# Patient Record
Sex: Female | Born: 1935 | Race: White | Hispanic: No | Smoking: Never smoker
Health system: Southern US, Community
[De-identification: ages and names within clinical notes are randomized; demographics above are authoritative.]

## PROBLEM LIST (undated history)

## (undated) DIAGNOSIS — J189 Pneumonia, unspecified organism: Secondary | ICD-10-CM

## (undated) DIAGNOSIS — T4145XA Adverse effect of unspecified anesthetic, initial encounter: Secondary | ICD-10-CM

## (undated) DIAGNOSIS — R7611 Nonspecific reaction to tuberculin skin test without active tuberculosis: Secondary | ICD-10-CM

## (undated) DIAGNOSIS — I219 Acute myocardial infarction, unspecified: Secondary | ICD-10-CM

## (undated) DIAGNOSIS — I2510 Atherosclerotic heart disease of native coronary artery without angina pectoris: Secondary | ICD-10-CM

## (undated) DIAGNOSIS — I509 Heart failure, unspecified: Secondary | ICD-10-CM

## (undated) DIAGNOSIS — R0602 Shortness of breath: Secondary | ICD-10-CM

## (undated) DIAGNOSIS — I209 Angina pectoris, unspecified: Secondary | ICD-10-CM

## (undated) DIAGNOSIS — R011 Cardiac murmur, unspecified: Secondary | ICD-10-CM

## (undated) DIAGNOSIS — I10 Essential (primary) hypertension: Secondary | ICD-10-CM

## (undated) DIAGNOSIS — G8929 Other chronic pain: Secondary | ICD-10-CM

## (undated) DIAGNOSIS — M545 Low back pain, unspecified: Secondary | ICD-10-CM

## (undated) DIAGNOSIS — E119 Type 2 diabetes mellitus without complications: Secondary | ICD-10-CM

## (undated) DIAGNOSIS — Z9289 Personal history of other medical treatment: Secondary | ICD-10-CM

## (undated) DIAGNOSIS — T8859XA Other complications of anesthesia, initial encounter: Secondary | ICD-10-CM

## (undated) DIAGNOSIS — K219 Gastro-esophageal reflux disease without esophagitis: Secondary | ICD-10-CM

## (undated) DIAGNOSIS — D649 Anemia, unspecified: Secondary | ICD-10-CM

## (undated) DIAGNOSIS — E7800 Pure hypercholesterolemia, unspecified: Secondary | ICD-10-CM

## (undated) DIAGNOSIS — M1990 Unspecified osteoarthritis, unspecified site: Secondary | ICD-10-CM

## (undated) HISTORY — PX: HEMORRHOID BANDING: SHX5850

## (undated) HISTORY — PX: FRACTURE SURGERY: SHX138

## (undated) HISTORY — PX: APPENDECTOMY: SHX54

## (undated) HISTORY — PX: CATARACT EXTRACTION: SUR2

## (undated) HISTORY — PX: ABDOMINAL HERNIA REPAIR: SHX539

## (undated) HISTORY — PX: HERNIA REPAIR: SHX51

## (undated) HISTORY — PX: FOOT FRACTURE SURGERY: SHX645

## (undated) HISTORY — PX: CARDIAC CATHETERIZATION: SHX172

---
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
## 1979-05-11 HISTORY — PX: CHOLECYSTECTOMY OPEN: SUR202

## 2009-09-09 HISTORY — PX: TISSUE AORTIC VALVE REPLACEMENT: SHX2527

## 2013-12-08 DIAGNOSIS — I219 Acute myocardial infarction, unspecified: Secondary | ICD-10-CM

## 2013-12-08 HISTORY — DX: Acute myocardial infarction, unspecified: I21.9

## 2013-12-27 ENCOUNTER — Encounter (HOSPITAL_COMMUNITY): Payer: Self-pay | Admitting: *Deleted

## 2013-12-27 ENCOUNTER — Inpatient Hospital Stay (HOSPITAL_COMMUNITY)
Admission: AD | Admit: 2013-12-27 | Discharge: 2013-12-29 | DRG: 280 | Disposition: A | Discharge status: Home / Self Care | Payer: Medicare Other | Source: Other Acute Inpatient Hospital | Attending: Cardiology | Admitting: Cardiology

## 2013-12-27 DIAGNOSIS — I10 Essential (primary) hypertension: Secondary | ICD-10-CM

## 2013-12-27 DIAGNOSIS — R000 Tachycardia, unspecified: Secondary | ICD-10-CM | POA: Diagnosis present

## 2013-12-27 DIAGNOSIS — Y9210 Unspecified residential institution as the place of occurrence of the external cause: Secondary | ICD-10-CM | POA: Diagnosis not present

## 2013-12-27 DIAGNOSIS — R0902 Hypoxemia: Secondary | ICD-10-CM | POA: Diagnosis present

## 2013-12-27 DIAGNOSIS — IMO0002 Reserved for concepts with insufficient information to code with codable children: Secondary | ICD-10-CM | POA: Diagnosis not present

## 2013-12-27 DIAGNOSIS — D638 Anemia in other chronic diseases classified elsewhere: Secondary | ICD-10-CM | POA: Diagnosis present

## 2013-12-27 DIAGNOSIS — Z7901 Long term (current) use of anticoagulants: Secondary | ICD-10-CM

## 2013-12-27 DIAGNOSIS — I214 Non-ST elevation (NSTEMI) myocardial infarction: Principal | ICD-10-CM

## 2013-12-27 DIAGNOSIS — Z881 Allergy status to other antibiotic agents status: Secondary | ICD-10-CM

## 2013-12-27 DIAGNOSIS — I428 Other cardiomyopathies: Secondary | ICD-10-CM | POA: Diagnosis present

## 2013-12-27 DIAGNOSIS — E039 Hypothyroidism, unspecified: Secondary | ICD-10-CM

## 2013-12-27 DIAGNOSIS — Y849 Medical procedure, unspecified as the cause of abnormal reaction of the patient, or of later complication, without mention of misadventure at the time of the procedure: Secondary | ICD-10-CM | POA: Diagnosis not present

## 2013-12-27 DIAGNOSIS — Z952 Presence of prosthetic heart valve: Secondary | ICD-10-CM

## 2013-12-27 DIAGNOSIS — Z885 Allergy status to narcotic agent status: Secondary | ICD-10-CM

## 2013-12-27 DIAGNOSIS — I4891 Unspecified atrial fibrillation: Secondary | ICD-10-CM | POA: Diagnosis present

## 2013-12-27 DIAGNOSIS — I5043 Acute on chronic combined systolic (congestive) and diastolic (congestive) heart failure: Secondary | ICD-10-CM | POA: Diagnosis present

## 2013-12-27 DIAGNOSIS — R031 Nonspecific low blood-pressure reading: Secondary | ICD-10-CM | POA: Diagnosis present

## 2013-12-27 DIAGNOSIS — R197 Diarrhea, unspecified: Secondary | ICD-10-CM | POA: Diagnosis present

## 2013-12-27 DIAGNOSIS — I482 Chronic atrial fibrillation, unspecified: Secondary | ICD-10-CM

## 2013-12-27 DIAGNOSIS — E669 Obesity, unspecified: Secondary | ICD-10-CM

## 2013-12-27 DIAGNOSIS — I9589 Other hypotension: Secondary | ICD-10-CM | POA: Diagnosis present

## 2013-12-27 DIAGNOSIS — L0390 Cellulitis, unspecified: Secondary | ICD-10-CM

## 2013-12-27 DIAGNOSIS — K56609 Unspecified intestinal obstruction, unspecified as to partial versus complete obstruction: Secondary | ICD-10-CM

## 2013-12-27 DIAGNOSIS — Z79899 Other long term (current) drug therapy: Secondary | ICD-10-CM

## 2013-12-27 DIAGNOSIS — I509 Heart failure, unspecified: Secondary | ICD-10-CM | POA: Diagnosis present

## 2013-12-27 DIAGNOSIS — G2581 Restless legs syndrome: Secondary | ICD-10-CM | POA: Diagnosis present

## 2013-12-27 DIAGNOSIS — T888XXA Other specified complications of surgical and medical care, not elsewhere classified, initial encounter: Secondary | ICD-10-CM | POA: Diagnosis not present

## 2013-12-27 DIAGNOSIS — Z6841 Body Mass Index (BMI) 40.0 and over, adult: Secondary | ICD-10-CM

## 2013-12-27 DIAGNOSIS — I429 Cardiomyopathy, unspecified: Secondary | ICD-10-CM

## 2013-12-27 DIAGNOSIS — Z7982 Long term (current) use of aspirin: Secondary | ICD-10-CM

## 2013-12-27 HISTORY — DX: Essential (primary) hypertension: I10

## 2013-12-27 HISTORY — DX: Shortness of breath: R06.02

## 2013-12-27 HISTORY — DX: Angina pectoris, unspecified: I20.9

## 2013-12-27 HISTORY — DX: Cardiac murmur, unspecified: R01.1

## 2013-12-27 HISTORY — DX: Atherosclerotic heart disease of native coronary artery without angina pectoris: I25.10

## 2013-12-27 HISTORY — DX: Heart failure, unspecified: I50.9

## 2013-12-27 LAB — MRSA PCR SCREENING: MRSA by PCR: NEGATIVE

## 2013-12-27 MED ORDER — OCUVITE-LUTEIN PO CAPS
1.0000 | ORAL_CAPSULE | Freq: Every day | ORAL | Status: DC
  Administered 2013-12-28 – 2013-12-29 (×2): 1 via ORAL
  Filled 2013-12-27 (×2): qty 1

## 2013-12-27 MED ORDER — ICAPS PO CAPS: 1.0000 | ORAL_CAPSULE | Freq: Every day | ORAL | Status: DC

## 2013-12-27 MED ORDER — CLOBETASOL PROPIONATE 0.05 % EX CREA
1.0000 "application " | TOPICAL_CREAM | Freq: Two times a day (BID) | CUTANEOUS | Status: DC
  Administered 2013-12-28: 1 via TOPICAL
  Filled 2013-12-27 (×2): qty 15

## 2013-12-27 MED ORDER — COLESEVELAM HCL 625 MG PO TABS
1875.0000 mg | ORAL_TABLET | Freq: Two times a day (BID) | ORAL | Status: DC
  Administered 2013-12-28 – 2013-12-29 (×4): 1875 mg via ORAL
  Filled 2013-12-27 (×6): qty 3

## 2013-12-27 MED ORDER — FUROSEMIDE 40 MG PO TABS
40.0000 mg | ORAL_TABLET | Freq: Two times a day (BID) | ORAL | Status: DC
  Administered 2013-12-28 – 2013-12-29 (×3): 40 mg via ORAL
  Filled 2013-12-27 (×5): qty 1

## 2013-12-27 MED ORDER — PANTOPRAZOLE SODIUM 40 MG IV SOLR
40.0000 mg | INTRAVENOUS | Status: DC
  Administered 2013-12-27: 40 mg via INTRAVENOUS
  Filled 2013-12-27 (×2): qty 40

## 2013-12-27 MED ORDER — ASPIRIN EC 81 MG PO TBEC
81.0000 mg | DELAYED_RELEASE_TABLET | Freq: Every day | ORAL | Status: DC
  Administered 2013-12-28 – 2013-12-29 (×2): 81 mg via ORAL
  Filled 2013-12-27 (×2): qty 1

## 2013-12-27 MED ORDER — BISOPROLOL FUMARATE 5 MG PO TABS
2.5000 mg | ORAL_TABLET | Freq: Every day | ORAL | Status: DC
  Administered 2013-12-27 – 2013-12-28 (×2): 2.5 mg via ORAL
  Filled 2013-12-27 (×2): qty 0.5

## 2013-12-27 MED ORDER — OXYBUTYNIN CHLORIDE ER 5 MG PO TB24
5.0000 mg | ORAL_TABLET | Freq: Every day | ORAL | Status: DC
  Administered 2013-12-28 – 2013-12-29 (×2): 5 mg via ORAL
  Filled 2013-12-27 (×2): qty 1

## 2013-12-27 MED ORDER — DILTIAZEM HCL ER 120 MG PO CP24
120.0000 mg | ORAL_CAPSULE | Freq: Every day | ORAL | Status: DC
  Administered 2013-12-28: 120 mg via ORAL
  Filled 2013-12-27: qty 1

## 2013-12-27 MED ORDER — FENTANYL 12 MCG/HR TD PT72
25.0000 ug | MEDICATED_PATCH | TRANSDERMAL | Status: DC
  Administered 2013-12-28: 25 ug via TRANSDERMAL
  Filled 2013-12-27: qty 1

## 2013-12-27 MED ORDER — NITROGLYCERIN 0.4 MG SL SUBL: 0.4000 mg | SUBLINGUAL_TABLET | SUBLINGUAL | Status: DC | PRN

## 2013-12-27 MED ORDER — ONDANSETRON HCL 4 MG/2ML IJ SOLN: 4.0000 mg | Freq: Four times a day (QID) | INTRAMUSCULAR | Status: DC | PRN

## 2013-12-27 MED ORDER — HEPARIN SODIUM (PORCINE) 5000 UNIT/ML IJ SOLN
5000.0000 [IU] | Freq: Three times a day (TID) | INTRAMUSCULAR | Status: DC
  Filled 2013-12-27 (×2): qty 1

## 2013-12-27 MED ORDER — LINACLOTIDE 290 MCG PO CAPS
290.0000 ug | ORAL_CAPSULE | Freq: Every day | ORAL | Status: DC
  Administered 2013-12-28 – 2013-12-29 (×2): 290 ug via ORAL
  Filled 2013-12-27 (×2): qty 1

## 2013-12-27 MED ORDER — VITAMIN D 1000 UNITS PO TABS
5000.0000 [IU] | ORAL_TABLET | Freq: Every day | ORAL | Status: DC
  Administered 2013-12-28 – 2013-12-29 (×2): 5000 [IU] via ORAL
  Filled 2013-12-27 (×2): qty 5

## 2013-12-27 MED ORDER — ACETAMINOPHEN 325 MG PO TABS: 650.0000 mg | ORAL_TABLET | ORAL | Status: DC | PRN

## 2013-12-27 MED ORDER — SODIUM CHLORIDE 0.9 % IV SOLN
1.0000 mL/kg/h | INTRAVENOUS | Status: DC
  Administered 2013-12-27: 1 mL/kg/h via INTRAVENOUS

## 2013-12-27 MED ORDER — POTASSIUM CHLORIDE CRYS ER 20 MEQ PO TBCR
20.0000 meq | EXTENDED_RELEASE_TABLET | Freq: Two times a day (BID) | ORAL | Status: DC
  Administered 2013-12-28 – 2013-12-29 (×2): 20 meq via ORAL
  Filled 2013-12-27 (×4): qty 1

## 2013-12-27 NOTE — H&P (Signed)
Patient ID: Melanie Camacho MRN: [id number], DOB/AGE: [birth year]   Admit date: 12/27/2013   Primary Physician: PROVIDER NOT IN SYSTEM Primary Cardiologist: Dr Patty Sermons (new)  HPI: 80 y/o from [place] with a history of tissue AVR in 2011 at [facility], no grafts per pt's daughter who is an Charity fundraiser in the OR here at [facility]. The pt has CAF, failed to hold after several attempts per pt's daughter. She has been rate controlled and on Eliquis. The pt had some intermittent issues with "heart failure secondary to her AF" but overall has been living alone, driving, taking care of her own home till recently. About 10 days ago she was admitted to [facility] with SBO. This was treated conservatively. She was off her Eliquis for 4-5 days. She was discharged then found at home in a chair unresponsive by the daughter. She was transported to [facility]. In the ER she was tachycardic, mildly hypoxic. Her mental status gradually improved. MRI was negative. Her Troponin's came back elevated with a peak Troponin of 33. An echo showed an EF of 40% with inf HK. Initially family was not inclined to proceed with cath but now they are and requested transfer to [facility]. The pt denies any anginal symptoms to me. Her labs from [facility] were stable but will repeat CBC, BMP, INR here.    Problem List: Past Medical History  Diagnosis Date  . Coronary artery disease   . Anginal pain   . Hypertension   . Heart murmur   . CHF (congestive heart failure)   . Shortness of breath     History reviewed. No pertinent past surgical history.   Allergies:  Allergies  Allergen Reactions  . Morphine And Related     Stevens-Johnsons  . Tape   . Bacitracin Rash  . Betadine [Povidone Iodine] Rash  . Clindamycin/Lincomycin Rash  . Vancomycin Rash     Home Medications No current facility-administered medications for this encounter.  See Med Rec   History reviewed. No pertinent family history. She denies a family history of  early CAD or DM.    History   Social History  . Marital Status: Widowed    Spouse Name: N/A    Number of Children: N/A  . Years of Education: N/A   Occupational History  . Not on file.   Social History Main Topics  . Smoking status: Never Smoker   . Smokeless tobacco: Never Used  . Alcohol Use: No  . Drug Use: No  . Sexual Activity: No   Other Topics Concern  . Not on file   Social History Narrative  . No narrative on file     Review of Systems: General: negative for chills, fever, night sweats or weight changes.  She had a diagnosis of diabetes but lost weight and is now off all medications for this. She has DJD, bilat rotator cuff problems, limited mobility Cardiovascular: negative for chest pain,  She has dyspnea on exertion, chronic edema,  No orthopnea, palpitations, paroxysmal nocturnal dyspnea or shortness of breath Dermatological: negative for rash Respiratory: negative for cough or wheezing Urologic: negative for hematuria Abdominal: negative for nausea, vomiting, diarrhea, bright red blood per rectum, melena, or hematemesis. Recent SBO. She had GI bleeding in 2003 s/p partial colectomy with a chronic incisional hernia Neurologic: negative for visual changes, syncope, or dizziness All other systems reviewed and are otherwise negative except as noted above.  Physical Exam: Blood pressure 106/65, pulse 104, temperature 97.4 F (  36.3 C), temperature source Oral, resp. rate 29, height 5\' 1"  (1.549 m), weight 238 lb 1.6 oz (108 kg), SpO2 100.00%.  General appearance: alert, cooperative, no distress and morbidly obese Neck: no carotid bruit and no JVD Lungs: clear to auscultation bilaterally Heart: irregularly irregular rhythm and 2/6 systolic murmur AOv Abdomen: morbid obesity, soft, mid line surgical scar, abdominal hernia noted near her surgical scar Extremities: 2+ bilat edema Pulses: 2+ and symmetric Skin: pale, cool, dry Neurologic: Grossly  normal    Labs:  No results found for this or any previous visit (from the past 24 hour(s)).   Radiology/Studies: No results found.  EKG:  From [facility] - AF low voltage, inferior TWI, poor ant RW  ASSESSMENT AND PLAN:  Principal Problem:   NSTEMI (non-ST elevated myocardial infarction) Active Problems:   Chronic a-fib   S/P AVR-(tissue) 2011 at [facility] - no CABG with this   Cardiomyopathy- EF 40% with inf HK by recent echo   SBO (small bowel obstruction)- recently off Eliquis for this   HTN- (transient hypotension during recent admission)   Hypothyroid   Obesity- BMI 45   PLAN: ? If she had an embolic MI while off Eliquis. No apparent CAD when she had her AVR in 2011. Pt and daughter agreeable to proceed with cath. She will need to be a radial procedure secondary to obesity and access. She has already had her Eliquis today- hold further doses till after cath.    Signed, Abelino DerrickLuke K Kilroy, PA-C 12/27/2013, 6:51 PM  I have reviewed the patient's history with her and her daughter.  I am in agreement with [facility]'s assessment as noted above.  She has a history of a bioprosthetic aortic valve replacement 4 years ago at [facility] by Dr. Silvestre MesiGlower.  Cardiac catheterization prior to her aortic valve replacement did not show any evidence of coronary disease according to the patient and according to the daughter who is a Engineer, civil (consulting)nurse.  The patient is in chronic atrial fibrillation.  She recently had been off her apixaban because of a recent admission for small bowel obstruction.  On the evening of April 16 the daughter who lives in [place] was unable to make contact with her mother [place] in [place].  The daughter drove to a gross per her and found her mother at about 2 AM unresponsive.  She had a blood pressure and a pulse.  EMS was called and she was taken to the hospital in [place].  Initially her outlook appeared very poor and the family did not want a cardiac catheterization done to evaluate  her marked elevation of troponin.  Her troponin peak at 33 and today was down to 5.  Subsequently because of the patient's clinical improvement the family decided that they didn't want her to have a cardiac catheterization but wanted it done here in [place] with his daughter lives.  Arrangements were therefore made for her transfer.  Her renal function has been stable.  We will plan for left heart catheter in a.m.  She certainly may have had a coronary artery embolus to explain her clinical presentation.

## 2013-12-27 NOTE — Progress Notes (Signed)
eLink Physician-Brief Progress Note Patient Name: Melanie Camacho DOB: 1931-10-25 MRN: [id number]  Date of Service  12/27/2013   HPI/Events of Note   Needs DVT proph  eICU Interventions  See orders for same    Intervention Category Intermediate Interventions: Best-practice therapies (e.g. DVT, beta blocker, etc.)  [name] 12/27/2013, 10:31 PM

## 2013-12-28 ENCOUNTER — Encounter (HOSPITAL_COMMUNITY)
Admission: AD | Disposition: A | Discharge status: Home / Self Care | Payer: Self-pay | Source: Other Acute Inpatient Hospital | Attending: Cardiology

## 2013-12-28 ENCOUNTER — Encounter (HOSPITAL_COMMUNITY): Payer: Self-pay | Admitting: Anesthesiology

## 2013-12-28 DIAGNOSIS — K56609 Unspecified intestinal obstruction, unspecified as to partial versus complete obstruction: Secondary | ICD-10-CM | POA: Diagnosis not present

## 2013-12-28 DIAGNOSIS — I214 Non-ST elevation (NSTEMI) myocardial infarction: Secondary | ICD-10-CM

## 2013-12-28 DIAGNOSIS — I10 Essential (primary) hypertension: Secondary | ICD-10-CM | POA: Diagnosis not present

## 2013-12-28 DIAGNOSIS — I4891 Unspecified atrial fibrillation: Secondary | ICD-10-CM | POA: Diagnosis not present

## 2013-12-28 DIAGNOSIS — L0390 Cellulitis, unspecified: Secondary | ICD-10-CM | POA: Diagnosis present

## 2013-12-28 DIAGNOSIS — Z954 Presence of other heart-valve replacement: Secondary | ICD-10-CM

## 2013-12-28 DIAGNOSIS — E039 Hypothyroidism, unspecified: Secondary | ICD-10-CM | POA: Diagnosis not present

## 2013-12-28 DIAGNOSIS — L539 Erythematous condition, unspecified: Secondary | ICD-10-CM

## 2013-12-28 HISTORY — PX: LEFT HEART CATHETERIZATION WITH CORONARY ANGIOGRAM: SHX5451

## 2013-12-28 LAB — CBC
HCT: 32.6 % — ABNORMAL LOW (ref 36.0–46.0)
HEMOGLOBIN: 10.7 g/dL — AB (ref 12.0–15.0)
MCH: 31.5 pg (ref 26.0–34.0)
MCHC: 32.8 g/dL (ref 30.0–36.0)
MCV: 95.9 fL (ref 78.0–100.0)
Platelets: 277 10*3/uL (ref 150–400)
RBC: 3.4 MIL/uL — AB (ref 3.87–5.11)
RDW: 15.3 % (ref 11.5–15.5)
WBC: 5.4 10*3/uL (ref 4.0–10.5)

## 2013-12-28 LAB — BASIC METABOLIC PANEL
BUN: 9 mg/dL (ref 6–23)
CALCIUM: 8.3 mg/dL — AB (ref 8.4–10.5)
CO2: 21 meq/L (ref 19–32)
CREATININE: 0.91 mg/dL (ref 0.50–1.10)
Chloride: 105 mEq/L (ref 96–112)
GFR calc Af Amer: 68 mL/min — ABNORMAL LOW (ref >90)
GFR calc non Af Amer: 58 mL/min — ABNORMAL LOW (ref >90)
GLUCOSE: 82 mg/dL (ref 70–99)
Potassium: 4.2 mEq/L (ref 3.7–5.3)
Sodium: 139 mEq/L (ref 137–147)

## 2013-12-28 LAB — TSH: TSH: 8.27 u[IU]/mL — ABNORMAL HIGH (ref 0.350–4.500)

## 2013-12-28 LAB — PROTIME-INR
INR: 1.28 (ref 0.00–1.49)
Prothrombin Time: 15.7 seconds — ABNORMAL HIGH (ref 11.6–15.2)

## 2013-12-28 SURGERY — LEFT HEART CATHETERIZATION WITH CORONARY ANGIOGRAM
Anesthesia: LOCAL

## 2013-12-28 MED ORDER — BISOPROLOL FUMARATE 5 MG PO TABS
5.0000 mg | ORAL_TABLET | Freq: Every day | ORAL | Status: DC
  Administered 2013-12-29: 5 mg via ORAL
  Filled 2013-12-28: qty 1

## 2013-12-28 MED ORDER — NITROGLYCERIN 0.2 MG/ML ON CALL CATH LAB
INTRAVENOUS | Status: AC
  Filled 2013-12-28: qty 1

## 2013-12-28 MED ORDER — FENTANYL CITRATE 0.05 MG/ML IJ SOLN
INTRAMUSCULAR | Status: AC
  Filled 2013-12-28: qty 2

## 2013-12-28 MED ORDER — PANTOPRAZOLE SODIUM 40 MG PO TBEC
40.0000 mg | DELAYED_RELEASE_TABLET | Freq: Every day | ORAL | Status: DC
  Administered 2013-12-28 – 2013-12-29 (×2): 40 mg via ORAL
  Filled 2013-12-28 (×2): qty 1

## 2013-12-28 MED ORDER — APIXABAN 5 MG PO TABS
5.0000 mg | ORAL_TABLET | Freq: Two times a day (BID) | ORAL | Status: DC
  Filled 2013-12-28 (×2): qty 1

## 2013-12-28 MED ORDER — MIDAZOLAM HCL 2 MG/2ML IJ SOLN
INTRAMUSCULAR | Status: AC
  Filled 2013-12-28: qty 2

## 2013-12-28 MED ORDER — ROPINIROLE HCL 0.25 MG PO TABS
0.2500 mg | ORAL_TABLET | Freq: Every day | ORAL | Status: DC
  Administered 2013-12-28: 0.25 mg via ORAL
  Filled 2013-12-28 (×2): qty 1

## 2013-12-28 MED ORDER — HEPARIN (PORCINE) IN NACL 2-0.9 UNIT/ML-% IJ SOLN
INTRAMUSCULAR | Status: AC
  Filled 2013-12-28: qty 1500

## 2013-12-28 MED ORDER — SODIUM CHLORIDE 0.9 % IV SOLN
3.0000 g | Freq: Four times a day (QID) | INTRAVENOUS | Status: DC
  Administered 2013-12-28 – 2013-12-29 (×2): 3 g via INTRAVENOUS
  Filled 2013-12-28 (×5): qty 3

## 2013-12-28 MED ORDER — SODIUM CHLORIDE 0.9 % IV SOLN
INTRAVENOUS | Status: DC
  Administered 2013-12-28: 14:00:00 via INTRAVENOUS

## 2013-12-28 MED ORDER — LIDOCAINE HCL (PF) 1 % IJ SOLN
INTRAMUSCULAR | Status: AC
  Filled 2013-12-28: qty 30

## 2013-12-28 NOTE — Progress Notes (Signed)
To the cath lab by bed, stable. 

## 2013-12-28 NOTE — Consult Note (Signed)
[facility] Medical Consultation  ARLENY RAMP IBB:[id number] DOB: Jan 24, 1932 DOA: 12/27/2013 PCP: PROVIDER NOT IN SYSTEM   Requesting physician: DR Anne Fu Date of consultation: 4/20 Reason for consultation: left forearm erythema  Impression/Recommendations Principal Problem:   NSTEMI (non-ST elevated myocardial infarction) Active Problems:   Chronic a-fib   S/P AVR-(tissue) 2011 at [facility] - no CABG with this   Cardiomyopathy- EF 40% with inf HK by recent echo   HTN- (transient hypotension during recent admission)   Hypothyroid   Obesity- BMI 45   SBO (small bowel obstruction)- recently off Eliquis for this    Left fore arm erythema: - probably from  IV infiltration. She has diffuse erythema and painful on palpation. Venous dopplers ordered to evaluate for thrombosis. Vancomycin ordered for possible early cellulitis.  - elevate the arm with warm compresses.   2. Watery diarrhea: - with her h/o recent antibiotic use, please rule out c diff pcr. Get GI pathogen by PCR.   3. Restless leg syndrome: - start her ropinorole  4. Hypertension: - bp borderline. Continue to monitor.   5. SBO: - no nausea or vomiting. Watery diarrhea.   6. Atrial fibrillation: - rate controlled.  7. Abnormal TSH: - get free t4 and free t3.   8. Anemia: - anemia panel ordered.      I will followup again tomorrow. Please contact me if I can be of assistance in the meanwhile. Thank you for this consultation.  Chief Complaint:   HPI: 80 y/o from [place] with a history of tissue AVR in 2011 at [place], and atrial fibrillatio is on eliquis came to [facility] for sbo.. In the ER she was tachycardic, mildly hypoxic. Her mental status gradually improved. MRI was negative. Her Troponin's came back elevated with a peak Troponin of 33. An echo showed an EF of 40% with inf HK. Pt family requested transfer to [facility]she was seen by cardiology and admitted by the same, underwent cardiac catheterization  on 4/21. Meanwhile patient had a peripheral line put in the left arm at the ED, and as per the Family at bedside, it had infiltrated and it was removed. She had IV heparin running in t he peripheral line. We were consulted to evaluate her left forearm erythema. In addition the above pt was recently treated for UTI with antibiotics, and she reports watery diarrhea 4 to 5 episodes today. She also reports restless legs and it has been bothering her.     Review of Systems:  See HPI otherwise negative.   Past Medical History  Diagnosis Date  . Coronary artery disease   . Anginal pain   . Hypertension   . Heart murmur   . CHF (congestive heart failure)   . Shortness of breath    History reviewed. No pertinent past surgical history. Social History:  reports that she has never smoked. She has never used smokeless tobacco. She reports that she does not drink alcohol or use illicit drugs.  Allergies  Allergen Reactions  . Morphine And Related     Stevens-Johnsons  . Tape   . Bacitracin Rash  . Betadine [Povidone Iodine] Rash  . Clindamycin/Lincomycin Rash  . Vancomycin Rash   History reviewed. No pertinent family history.  Prior to Admission medications   Medication Sig Start Date End Date Taking? Authorizing Provider  apixaban (ELIQUIS) 5 MG TABS tablet Take 5 mg by mouth 2 (two) times daily.   Yes Historical Provider, MD  bisoprolol (ZEBETA) 5 MG tablet Take 2.5  mg by mouth daily.   Yes Historical Provider, MD  Cholecalciferol (VITAMIN D3) 5000 UNITS CAPS Take 5,000 Units by mouth daily.   Yes Historical Provider, MD  clobetasol cream (TEMOVATE) 0.05 % Apply 1 application topically 2 (two) times daily.   Yes Historical Provider, MD  colesevelam (WELCHOL) 625 MG tablet Take 1,875 mg by mouth 2 (two) times daily with a meal.   Yes Historical Provider, MD  diltiazem (DILACOR XR) 120 MG 24 hr capsule Take 120 mg by mouth daily.   Yes Historical Provider, MD  fentaNYL (DURAGESIC - DOSED  MCG/HR) 25 MCG/HR patch Place 25 mcg onto the skin every 3 (three) days.   Yes Historical Provider, MD  furosemide (LASIX) 40 MG tablet Take 40 mg by mouth 2 (two) times daily.   Yes Historical Provider, MD  Linaclotide (LINZESS) 290 MCG CAPS capsule Take 290 mcg by mouth daily.   Yes Historical Provider, MD  Multiple Vitamins-Minerals (ICAPS) CAPS Take 1 capsule by mouth daily.   Yes Historical Provider, MD  Multiple Vitamins-Minerals (MULTIVITAMIN WITH MINERALS) tablet Take 1 tablet by mouth daily.   Yes Historical Provider, MD  OVER THE COUNTER MEDICATION Take 1 capsule by mouth daily. Hair, Skin and Nails   Yes Historical Provider, MD  oxybutynin (DITROPAN-XL) 5 MG 24 hr tablet Take 5 mg by mouth daily.   Yes Historical Provider, MD  potassium chloride SA (K-DUR,KLOR-CON) 20 MEQ tablet Take 20 mEq by mouth 2 (two) times daily.   Yes Historical Provider, MD   Physical Exam: Blood pressure 101/48, pulse 92, temperature 98 F (36.7 C), temperature source Oral, resp. rate 21, height 5\' 1"  (1.549 m), weight 108 kg (238 lb 1.6 oz), SpO2 96.00%. Filed Vitals:   12/28/13 1149  BP: 101/48  Pulse: 92  Temp: 98 F (36.7 C)  Resp: 21     General:  Alert afebrile comfortable  Cardiovascular: s1s2   Respiratory: CTAB  Abdomen: soft NT ND BS+  Musculoskeletal/ skin: : left forearm erythema and tenderness.    Labs on Admission:  Basic Metabolic Panel:  Recent Labs Lab 12/28/13 0725  NA 139  K 4.2  CL 105  CO2 21  GLUCOSE 82  BUN 9  CREATININE 0.91  CALCIUM 8.3*   Liver Function Tests: No results found for this basename: AST, ALT, ALKPHOS, BILITOT, PROT, ALBUMIN,  in the last 168 hours No results found for this basename: LIPASE, AMYLASE,  in the last 168 hours No results found for this basename: AMMONIA,  in the last 168 hours CBC:  Recent Labs Lab 12/28/13 0725  WBC 5.4  HGB 10.7*  HCT 32.6*  MCV 95.9  PLT 277   Cardiac Enzymes: No results found for this basename:  CKTOTAL, CKMB, CKMBINDEX, TROPONINI,  in the last 168 hours BNP: No components found with this basename: POCBNP,  CBG: No results found for this basename: GLUCAP,  in the last 168 hours  Radiological Exams on Admission: No results found.   Time spent: 50 min  Kathlen ModyVijaya Shenika Quint [facility] Pager [phone]  If 7PM-7AM, please contact night-coverage [email or web address] Password [email or web address] 12/28/2013, 1:18 PM

## 2013-12-28 NOTE — Progress Notes (Addendum)
ANTIBIOTIC CONSULT NOTE - INITIAL  Pharmacy Consult for Unasyn Indication: LUE cellultis  Allergies  Allergen Reactions  . Morphine And Related     Stevens-Johnsons  . Tape   . Bacitracin Rash  . Betadine [Povidone Iodine] Rash  . Clindamycin/Lincomycin Rash  . Vancomycin Rash    Patient Measurements: Height: 5\' 1"  (154.9 cm) Weight: 238 lb 1.6 oz (108 kg) IBW/kg (Calculated) : 47.8  Vital Signs: Temp: 97.7 F (36.5 C) (04/21 2028) Temp src: Oral (04/21 2028) BP: 111/33 mmHg (04/21 2028) Pulse Rate: 87 (04/21 2028) Intake/Output from previous day: 04/20 0701 - 04/21 0700 In: 473.4 [I.V.:473.4] Out: 250 [Urine:250] Intake/Output from this shift:    Labs:  Recent Labs  12/28/13 0725  WBC 5.4  HGB 10.7*  PLT 277  CREATININE 0.91   Estimated Creatinine Clearance: 56.9 ml/min (by C-G formula based on Cr of 0.91). No results found for this basename: VANCOTROUGH, Melanie Camacho, VANCORANDOM, GENTTROUGH, GENTPEAK, GENTRANDOM, TOBRATROUGH, TOBRAPEAK, TOBRARND, AMIKACINPEAK, AMIKACINTROU, AMIKACIN,  in the last 72 hours   Microbiology: Recent Results (from the past 720 hour(s))  MRSA PCR SCREENING     Status: None   Collection Time    12/27/13  5:37 PM      Result Value Ref Range Status   MRSA by PCR NEGATIVE  NEGATIVE Final   Comment:            The GeneXpert MRSA Assay (FDA     approved for NASAL specimens     only), is one component of a     comprehensive MRSA colonization     surveillance program. It is not     intended to diagnose MRSA     infection nor to guide or     monitor treatment for     MRSA infections.    Medical History: Past Medical History  Diagnosis Date  . Coronary artery disease   . Anginal pain   . Hypertension   . Heart murmur   . CHF (congestive heart failure)   . Shortness of breath     Assessment: [name] to start Unasyn for LUE cellulitis. The patient is noted to recently have IV access in this arm - now removed. Dopplers have  been ordered to r/o DVT. Wt: 108 kg, SCr 0.91, CrCl~50-60 ml/min (normalized).  Goal of Therapy:  Proper antibiotics for infection/cultures adjusted for renal/hepatic function   Plan:  1. Unasyn 3g IV every 6 hours 2. Will continue to follow renal function, culture results, LOT, and antibiotic de-escalation plans   [name], PharmD, BCPS Clinical Pharmacist Pager: [phone] 12/28/2013 9:13 PM

## 2013-12-28 NOTE — Care Management Note (Addendum)
  Page 1 of 1   12/30/2013     11:58:34 AM CARE MANAGEMENT NOTE 12/30/2013  Patient:  Melanie Camacho, Melanie Camacho   Account Number:  [id number]  Date Initiated:  12/28/2013  Documentation initiated by:  Junius Creamer  Subjective/Objective Assessment:   adm w mi     Action/Plan:   lives alone, granda is rn at [employer]   Anticipated DC Date:  12/30/2013   Anticipated DC Plan:  HOME/SELF CARE      DC Planning Services  CM consult  Medication Assistance      Choice offered to / List presented to:             Status of service:  Completed, signed off Medicare Important Message given?   (If response is "NO", the following Medicare IM given date fields will be blank) Date Medicare IM given:   Date Additional Medicare IM given:    Discharge Disposition:  HOME/SELF CARE  Per UR Regulation:  Reviewed for med. necessity/level of care/duration of stay  If discussed at Long Length of Stay Meetings, dates discussed:    Comments:  CM Consult Note:  Medication Eliquis Benefits Check:  PT COPAY WILL BE $3.50-NO PRIOR AUTH REQUIRED apixaban (ELIQUIS) tablet 5 mg   Oral  :  2 times daily Please check coverage, co-pays, deductibles, authorizations, preferred pharmacy. Thanks, [employer] RN, BSN, MSHL, CCM 12/29/2013  Per hand off report: "---12/28/2013 1633 by [facility]--- transfer from 2H/ cathed/ nor cors/ inpt documented" [employer] RN, BSN, Lena, CCM 12/29/2013

## 2013-12-28 NOTE — Progress Notes (Signed)
Not coming back, belongings sent to cath lab. endorsed to  the staff. Daughter made aware.

## 2013-12-28 NOTE — H&P (View-Only) (Signed)
Subjective:  No complaints. No CP, no SOB.   Objective:  Vital Signs in the last 24 hours: Temp:  [97.4 F (36.3 C)-99.3 F (37.4 C)] 98.6 F (37 C) (04/21 0745) Pulse Rate:  [30-110] 83 (04/21 0800) Resp:  [16-29] 20 (04/21 0800) BP: (94-112)/(37-69) 97/45 mmHg (04/21 0800) SpO2:  [98 %-100 %] 98 % (04/21 0800) Weight:  [238 lb 1.6 oz (108 kg)] 238 lb 1.6 oz (108 kg) (04/20 1738)  Intake/Output from previous day: 04/20 0701 - 04/21 0700 In: 473.4 [I.V.:473.4] Out: 250 [Urine:250]   Physical Exam: General: Well developed, well nourished, in no acute distress. Head:  Normocephalic and atraumatic. Lungs: Clear to auscultation and percussion. Heart: Normal S1 and S2.  Soft systolic murmur, no rubs or gallops.  Abdomen: soft, non-tender, positive bowel sounds. Extremities: No clubbing or cyanosis. 2) BLE edema. Left arm erythema noted. Right arm wrapped in bandage at wrist.  Neurologic: Alert and oriented x 3.    Lab Results:  Recent Labs  12/28/13 0725  WBC 5.4  HGB 10.7*  PLT 277    Recent Labs  12/28/13 0725  NA 139  K 4.2  CL 105  CO2 21  GLUCOSE 82  BUN 9  CREATININE 0.91   Telemetry: AFIB rate cont Personally viewed.   EKG:  AFIB PRWP, NSSTW changes  Cardiac Studies:  Cath pending  . aspirin EC  81 mg Oral Daily  . bisoprolol  2.5 mg Oral Daily  . cholecalciferol  5,000 Units Oral Daily  . clobetasol cream  1 application Topical BID  . colesevelam  1,875 mg Oral BID WC  . fentaNYL  25 mcg Transdermal Q72H  . furosemide  40 mg Oral BID  . Linaclotide  290 mcg Oral Daily  . multivitamin-lutein  1 capsule Oral Daily  . oxybutynin  5 mg Oral Daily  . pantoprazole (PROTONIX) IV  40 mg Intravenous Q24H  . potassium chloride SA  20 mEq Oral BID   Assessment/Plan:  Principal Problem:   NSTEMI (non-ST elevated myocardial infarction) Active Problems:   Chronic a-fib   S/P AVR-(tissue) 2011 at [facility] - no CABG with this   Cardiomyopathy- EF 40%  with inf HK by recent echo   HTN- (transient hypotension during recent admission)   Hypothyroid   Obesity- BMI 45   SBO (small bowel obstruction)- recently off Eliquis for this    80 year old [facility], recent NSTEMI Trop 30, PAF off Eliquis 6 days for recent SBO, EF 40% [place] [facility] [place] [facility] [place]. Daughter works in OR here as Charity fundraiserN.   1) NSTEMI  - cath today  - avoid crossing aortic valve (bioprosthetic)  2) Left arm erythema  - will consult hospitalist  - IV has been removed  3) Recent SBO  - bowels seem improved  - will change pantoprazole to PO from IV  - appreciate hospitalist comments on SBO   4) Cardiomyopathy  - EF 40%  - On bisoprolol 2.5mg  QD  - BP soft (hypotension) will hold off on ACE-I for now.   5) AVR  - [facility] 2011  - Bioprosthetic  6) AFIB  - rate controlled  - would optimally like her to be on increased bisoprolol and stop diltiazem in the setting of decreased EF.   - will stop diltiazem and increase bisoprolol to 5mg .   - Restart Eliquis when able  7) Combined systolic and diastolic HF, acute on chronic  - edema in  LE noted  - continue with lasix 40 PO BID  - no dyspnea  - avoiding calcium channel blockers.    Donato Schultz 12/28/2013, 11:28 AM

## 2013-12-28 NOTE — Interval H&P Note (Signed)
Cath Lab Visit (complete for each Cath Lab visit)  Clinical Evaluation Leading to the Procedure:   ACS: yes  Non-ACS:    Anginal Classification: CCS IV  Anti-ischemic medical therapy: Maximal Therapy (2 or more classes of medications)  Non-Invasive Test Results: No non-invasive testing performed  Prior CABG: No previous CABG      History and Physical Interval Note:  12/28/2013 12:26 PM  Melanie Camacho  has presented today for surgery, with the diagnosis of cp  The various methods of treatment have been discussed with the patient and family. After consideration of risks, benefits and other options for treatment, the patient has consented to  Procedure(s): LEFT HEART CATHETERIZATION WITH CORONARY ANGIOGRAM (N/A) as a surgical intervention .  The patient's history has been reviewed, patient examined, no change in status, stable for surgery.  I have reviewed the patient's chart and labs.  Questions were answered to the patient's satisfaction.     Lennette Bihari

## 2013-12-28 NOTE — CV Procedure (Signed)
Melanie Camacho is a 80 y.o. female    [id number]  [id number] LOCATION:  FACILITY: [facility]  PHYSICIAN: Lennette Bihari, MD, [facility] March 26, 1932   DATE OF PROCEDURE:  12/28/2013    CARDIAC CATHETERIZATION     HISTORY:    [name] is a 80 year old female from [place], who underwent aortic valve replacement with a tissue valve in 2011 at [place].  At that time she had apparently normal coronary arteries.  She recently was hospitalized in [facility] with a small bowel obstruction, which was treated conservatively.  She has a history of atrial fibrillation and was of Eliquis for 4-5 days.  There is a question of an embolic etiology to an MI with a peak troponin of 33.  An echo at the outside hospital showed an ejection fraction of 40% with inferior hypokinesis.  The patient  was transported to [facility].  She has been off Eliquis since admission to this hospital and now presents for definitive cardiac catheterization.   PROCEDURE:  Coronary angiography and supravalvular aortography.  The patient was brought to the second floor [facility] cath lab in the postabsorptive state. She was premedicated with Versed 1 mg and fentanyl 25 mcg.  She did not appear to have access to attempt a radial approach due to multiple infiltrations of her left arm and a bandage over her right wrist.  Her  right groin was prepped and shaved in usual sterile fashion. Xylocaine 1% was used for local anesthesia.  The right femoral artery was punctured anteriorly with the first stent and a 5 French sheath was inserted without difficulty.  Coronary angiography was performed with 5 French Judkins 4 left and right diagnostic catheters.  With the patient having an aortic valve replacement, this is was made not to cross the aortic valve.  A 5 French pigtail catheter was inserted and advanced to the central aorta and supravalvular aortography was performed without difficulty.  All catheters were removed and the  patient.  Hemostasis was obtained by direct pressure.  She tolerated the procedure well and returned to her room in stable condition.  HEMODYNAMICS:   Central Aorta: 117/61    ANGIOGRAPHY:  1. Left main:  Normal vessel, which bifurcated into the LAD and left circumflex artery.  2. LAD:  Angiographically normal vessel, which gave rise to 2 diagonal vessels and several septal perforator arteries and extended to and wrapped around the LV apex 3. Left circumflex: Normal vessel, which gave rise to 2 marginal branches. 4. Right coronary artery: Very large, dominant, angiographically normal vessel, which give rise to a large PDA and large PLA vessel  Supravalvular aortography revealed a competent bioprosthetic aortic valve without significant aortic insufficiency.  IMPRESSION:  Normal appearing bioprosthetic aortic valve replacement without significant angiographic aortic insufficiency.  Normal coronary arteries  Lennette Bihari, MD, [facility] 12/28/2013 1:27 PM

## 2013-12-28 NOTE — Progress Notes (Signed)
  Subjective:  No complaints. No CP, no SOB.   Objective:  Vital Signs in the last 24 hours: Temp:  [97.4 F (36.3 C)-99.3 F (37.4 C)] 98.6 F (37 C) (04/21 0745) Pulse Rate:  [30-110] 83 (04/21 0800) Resp:  [16-29] 20 (04/21 0800) BP: (94-112)/(37-69) 97/45 mmHg (04/21 0800) SpO2:  [98 %-100 %] 98 % (04/21 0800) Weight:  [238 lb 1.6 oz (108 kg)] 238 lb 1.6 oz (108 kg) (04/20 1738)  Intake/Output from previous day: 04/20 0701 - 04/21 0700 In: 473.4 [I.V.:473.4] Out: 250 [Urine:250]   Physical Exam: General: Well developed, well nourished, in no acute distress. Head:  Normocephalic and atraumatic. Lungs: Clear to auscultation and percussion. Heart: Normal S1 and S2.  Soft systolic murmur, no rubs or gallops.  Abdomen: soft, non-tender, positive bowel sounds. Extremities: No clubbing or cyanosis. 2) BLE edema. Left arm erythema noted. Right arm wrapped in bandage at wrist.  Neurologic: Alert and oriented x 3.    Lab Results:  Recent Labs  12/28/13 0725  WBC 5.4  HGB 10.7*  PLT 277    Recent Labs  12/28/13 0725  NA 139  K 4.2  CL 105  CO2 21  GLUCOSE 82  BUN 9  CREATININE 0.91   Telemetry: AFIB rate cont Personally viewed.   EKG:  AFIB PRWP, NSSTW changes  Cardiac Studies:  Cath pending  . aspirin EC  81 mg Oral Daily  . bisoprolol  2.5 mg Oral Daily  . cholecalciferol  5,000 Units Oral Daily  . clobetasol cream  1 application Topical BID  . colesevelam  1,875 mg Oral BID WC  . fentaNYL  25 mcg Transdermal Q72H  . furosemide  40 mg Oral BID  . Linaclotide  290 mcg Oral Daily  . multivitamin-lutein  1 capsule Oral Daily  . oxybutynin  5 mg Oral Daily  . pantoprazole (PROTONIX) IV  40 mg Intravenous Q24H  . potassium chloride SA  20 mEq Oral BID   Assessment/Plan:  Principal Problem:   NSTEMI (non-ST elevated myocardial infarction) Active Problems:   Chronic a-fib   S/P AVR-(tissue) 2011 at [facility] - no CABG with this   Cardiomyopathy- EF 40%  with inf HK by recent echo   HTN- (transient hypotension during recent admission)   Hypothyroid   Obesity- BMI 45   SBO (small bowel obstruction)- recently off Eliquis for this    81 year old with AVR at [facility] bioprosthetic, recent NSTEMI Trop 30, PAF off Eliquis 6 days for recent SBO, EF 40% transferred here from [place] for cath. Daughter works in OR here as RN.   1) NSTEMI  - cath today  - avoid crossing aortic valve (bioprosthetic)  2) Left arm erythema  - will consult hospitalist  - IV has been removed  3) Recent SBO  - bowels seem improved  - will change pantoprazole to PO from IV  - appreciate hospitalist comments on SBO   4) Cardiomyopathy  - EF 40%  - On bisoprolol 2.5mg QD  - BP soft (hypotension) will hold off on ACE-I for now.   5) AVR  - [facility] 2011  - Bioprosthetic  6) AFIB  - rate controlled  - would optimally like her to be on increased bisoprolol and stop diltiazem in the setting of decreased EF.   - will stop diltiazem and increase bisoprolol to 5mg.   - Restart Eliquis when able  7) Combined systolic and diastolic HF, acute on chronic  - edema in   LE noted  - continue with lasix 40 PO BID  - no dyspnea  - avoiding calcium channel blockers.    Donato Schultz 12/28/2013, 11:28 AM

## 2013-12-29 DIAGNOSIS — I4891 Unspecified atrial fibrillation: Secondary | ICD-10-CM

## 2013-12-29 DIAGNOSIS — M79609 Pain in unspecified limb: Secondary | ICD-10-CM

## 2013-12-29 DIAGNOSIS — I10 Essential (primary) hypertension: Secondary | ICD-10-CM

## 2013-12-29 DIAGNOSIS — I214 Non-ST elevation (NSTEMI) myocardial infarction: Secondary | ICD-10-CM | POA: Diagnosis not present

## 2013-12-29 LAB — T4, FREE: Free T4: 1.03 ng/dL (ref 0.80–1.80)

## 2013-12-29 LAB — GI PATHOGEN PANEL BY PCR, STOOL
C difficile toxin A/B: NEGATIVE
Campylobacter by PCR: NEGATIVE
Cryptosporidium by PCR: NEGATIVE
E coli (ETEC) LT/ST: NEGATIVE
E coli (STEC): NEGATIVE
E coli 0157 by PCR: NEGATIVE
G lamblia by PCR: NEGATIVE
Norovirus GI/GII: NEGATIVE
Rotavirus A by PCR: NEGATIVE
Salmonella by PCR: NEGATIVE
Shigella by PCR: NEGATIVE

## 2013-12-29 LAB — T3, FREE: T3, Free: 2.1 pg/mL — ABNORMAL LOW (ref 2.3–4.2)

## 2013-12-29 LAB — CLOSTRIDIUM DIFFICILE BY PCR: Toxigenic C. Difficile by PCR: NEGATIVE

## 2013-12-29 MED ORDER — DOXYCYCLINE HYCLATE 100 MG PO TABS: 100.0000 mg | ORAL_TABLET | Freq: Two times a day (BID) | ORAL | Status: DC

## 2013-12-29 MED ORDER — BISOPROLOL FUMARATE 5 MG PO TABS: 5.0000 mg | ORAL_TABLET | Freq: Every day | ORAL | Status: AC

## 2013-12-29 MED ORDER — ASPIRIN 81 MG PO TBEC: 81.0000 mg | DELAYED_RELEASE_TABLET | Freq: Every day | ORAL | Status: AC

## 2013-12-29 MED ORDER — DILTIAZEM HCL ER 120 MG PO CP24: 120.0000 mg | ORAL_CAPSULE | Freq: Every day | ORAL | Status: DC

## 2013-12-29 MED ORDER — DOXYCYCLINE HYCLATE 100 MG PO TABS
100.0000 mg | ORAL_TABLET | Freq: Two times a day (BID) | ORAL | Status: DC
  Administered 2013-12-29: 14:00:00 100 mg via ORAL
  Filled 2013-12-29 (×3): qty 1

## 2013-12-29 MED ORDER — APIXABAN 5 MG PO TABS: 5.0000 mg | ORAL_TABLET | Freq: Two times a day (BID) | ORAL | Status: DC

## 2013-12-29 MED FILL — Heparin Sodium (Porcine) 100 Unt/ML in Sodium Chloride 0.45%: INTRAMUSCULAR | Qty: 250 | Status: AC

## 2013-12-29 NOTE — Discharge Instructions (Signed)
See your primary care MD on Friday. Assess your arm, BP and heart rate.   Plan and Medication changes:  For now, 1. Hold Diltiazem (Dilacor) 120 mg 2. Increase bisoprolol from 2.5 mg to 5 mg daily 3. Hold Eliquis until Friday, but would prefer your family MD sees you on Friday to make sure your arm is improving before restarting. 4. Consider an Event Monitor - worn constantly for 3-4 weeks, it will track your heart and send a signal if any abnormal heart rhythms are seen.  5. Repeat an echocardiogram in 6 weeks to re-assess heart muscle function (it was a little weak on original echo). 6. Repeat thyroid labs (TSH) and check Free T3 and T4 to make sure thyroid gland is working properly. 7. You are anemic, but it may be related to recent medical problems and hospitalizations. Follow up with your primary MD for this.   PLEASE REMEMBER TO BRING ALL OF YOUR MEDICATIONS TO EACH OF YOUR FOLLOW-UP OFFICE VISITS.  PLEASE ATTEND ALL SCHEDULED FOLLOW-UP APPOINTMENTS.   Activity: Increase activity slowly as tolerated. You may shower, but no soaking baths (or swimming) for 1 week. No driving for 2 days. No lifting over 5 lbs for 1 week. No sexual activity for 1 week.   You May Return to Work: in 1 week (if applicable)  Wound Care: You may wash cath site gently with soap and water. Keep cath site clean and dry. If you notice pain, swelling, bleeding or pus at your cath site, please call [phone].    Cardiac Cath Site Care Refer to this sheet in the next few weeks. These instructions provide you with information on caring for yourself after your procedure. Your caregiver may also give you more specific instructions. Your treatment has been planned according to current medical practices, but problems sometimes occur. Call your caregiver if you have any problems or questions after your procedure. HOME CARE INSTRUCTIONS  You may shower 24 hours after the procedure. Remove the bandage (dressing) and gently  wash the site with plain soap and water. Gently pat the site dry.   Do not apply powder or lotion to the site.   Do not sit in a bathtub, swimming pool, or whirlpool for 5 to 7 days.   No bending, squatting, or lifting anything over 10 pounds (4.5 kg) as directed by your caregiver.   Inspect the site at least twice daily.   Do not drive home if you are discharged the same day of the procedure. Have someone else drive you.   You may drive 24 hours after the procedure unless otherwise instructed by your caregiver.  What to expect:  Any bruising will usually fade within 1 to 2 weeks.   Blood that collects in the tissue (hematoma) may be painful to the touch. It should usually decrease in size and tenderness within 1 to 2 weeks.  SEEK IMMEDIATE MEDICAL CARE IF:  You have unusual pain at the site or down the affected limb.   You have redness, warmth, swelling, or pain at the site.   You have drainage (other than a small amount of blood on the dressing).   You have chills.   You have a fever or persistent symptoms for more than 72 hours.   You have a fever and your symptoms suddenly get worse.   Your leg becomes pale, cool, tingly, or numb.   You have heavy bleeding from the site. Hold pressure on the site.  Document Released: 09/28/2010 Document  Revised: 08/15/2011 Document Reviewed: 09/28/2010 [facility] Patient Information 2012 [place], [place].

## 2013-12-29 NOTE — Progress Notes (Signed)
Patient Name: Melanie CuriaDoris T Santillo Date of Encounter: 12/29/2013  Principal Problem:   NSTEMI (non-ST elevated myocardial infarction) Active Problems:   Chronic a-fib   S/P AVR-(tissue) 2011 at [facility] - no CABG with this   Cardiomyopathy- EF 40% with inf HK by recent echo   HTN- (transient hypotension during recent admission)   Hypothyroid   Obesity- BMI 45   SBO (small bowel obstruction)- recently off Eliquis for this   Cellulitis    Patient Profile: 80 yo female w/ hx AVR 2011, CAF on Eliquis, found unresponsive, [facility], NSTEMI there, EF 40%, tx [facility] 04/20, cath 04/21 w/ nl cors, AoV OK.  SUBJECTIVE: No chest pain, no SOB. Abd is tender, she was to have EGD and colon.   OBJECTIVE Filed Vitals:   12/28/13 2028 12/28/13 2343 12/29/13 0100 12/29/13 0453  BP: 111/33 108/32  103/43  Pulse: 87 89  81  Temp: 97.7 F (36.5 C) 98.9 F (37.2 C)  98 F (36.7 C)  TempSrc: Oral Oral  Oral  Resp: 15 17  20   Height:      Weight:   193 lb 5.5 oz (87.7 kg)   SpO2: 92% 96%  92%    Intake/Output Summary (Last 24 hours) at 12/29/13 0703 Last data filed at 12/29/13 [id number]  Gross per 24 hour  Intake 1327.92 ml  Output   1000 ml  Net 327.92 ml   Filed Weights   12/27/13 1738 12/29/13 0100  Weight: 238 lb 1.6 oz (108 kg) 193 lb 5.5 oz (87.7 kg)    PHYSICAL EXAM General: Well developed, well nourished, female in no acute distress. Head: Normocephalic, atraumatic.  Neck: Supple without bruits, JVD not elevated. Lungs:  Resp regular and unlabored, few rales bases. Heart: RRR, S1, S2, no S3, S4, soft murmur; no rub. Abdomen: Soft, non-tender, non-distended, BS + x 4.  Extremities: No clubbing, cyanosis, no edema. Cath site without hematoma/bruit Neuro: Alert and oriented X 3. Moves all extremities spontaneously. Psych: Normal affect.  LABS: CBC: Recent Labs  12/28/13 0725  WBC 5.4  HGB 10.7*  HCT 32.6*  MCV 95.9  PLT 277   INR: Recent Labs  12/28/13 0714  INR  1.28   Basic Metabolic Panel: Recent Labs  12/28/13 0725  NA 139  K 4.2  CL 105  CO2 21  GLUCOSE 82  BUN 9  CREATININE 0.91  CALCIUM 8.3*   Thyroid Function Tests: Recent Labs  12/28/13 0714  TSH 8.270*   TELE:  Atrial fib      Cardiac Cath: 12/28/2013 ANGIOGRAPHY:  1. Left main: Normal vessel, which bifurcated into the LAD and left circumflex artery.  2. LAD: Angiographically normal vessel, which gave rise to 2 diagonal vessels and several septal perforator arteries and extended to and wrapped around the LV apex  3. Left circumflex: Normal vessel, which gave rise to 2 marginal branches.  4. Right coronary artery: Very large, dominant, angiographically normal vessel, which give rise to a large PDA and large PLA vessel  Supravalvular aortography revealed a competent bioprosthetic aortic valve without significant aortic insufficiency.  IMPRESSION:  Normal appearing bioprosthetic aortic valve replacement without significant angiographic aortic insufficiency.  Normal coronary arteries   Current Medications:  . ampicillin-sulbactam (UNASYN) IV  3 g Intravenous Q6H  . apixaban  5 mg Oral BID  . aspirin EC  81 mg Oral Daily  . bisoprolol  5 mg Oral Daily  . cholecalciferol  5,000 Units Oral Daily  .  clobetasol cream  1 application Topical BID  . colesevelam  1,875 mg Oral BID WC  . fentaNYL  25 mcg Transdermal Q72H  . furosemide  40 mg Oral BID  . Linaclotide  290 mcg Oral Daily  . multivitamin-lutein  1 capsule Oral Daily  . oxybutynin  5 mg Oral Daily  . pantoprazole  40 mg Oral Daily  . potassium chloride SA  20 mEq Oral BID  . rOPINIRole  0.25 mg Oral QHS   . sodium chloride 125 mL/hr at 12/28/13 1420    ASSESSMENT AND PLAN: Principal Problem:   NSTEMI (non-ST elevated myocardial infarction) - unclear cause, ?takotsubo.   Active Problems:   Unresponsive - started everything when pt was found unresponsive at home. Unclear cause, she had sharp abdominal pain and  doesn't remember anything for a while. MD advise on further eval for this, no cardiac cause found.     Chronic a-fib - rate OK    S/P AVR-(tissue) 2011 at [facility] - no CABG with this - AVR OK at cath    Cardiomyopathy- EF 40% with inf HK by recent echo - on ASA, BB, Lasix oral, BP borderline for adding ACE    HTN- (transient hypotension during recent admission) - Good control    Hypothyroid - do not see Rx, MD advise    Obesity- BMI 45 - encourage hearthealthy eating    SBO (small bowel obstruction)- recently off Eliquis for this - make sure GI tract functioning OK    Cellulitis - follow, on Unasyn  Signed, Darrol Jump , PA-C 7:03 AM 12/29/2013   Patient seen and examined. Agree with assessment and plan. No recurrent chest pain. Normal coronaries. For doppler of left arm today per hospitalist team. OK from our standpoint to dc later if stable. Will need eventual resumption of eliquis as outpatient with AF, but in interim start ASA.   Lennette Bihari, MD, [facility] 12/29/2013 8:24 AM

## 2013-12-29 NOTE — Discharge Summary (Signed)
CARDIOLOGY DISCHARGE SUMMARY   Patient ID: Melanie Camacho MRN: [id number] DOB/AGE: 12/23/1931 80 y.o.  Admit date: 12/27/2013 Discharge date: 12/29/2013  PCP: PROVIDER NOT IN SYSTEM Primary Cardiologist: New, Dr. Patty Sermons  Primary Discharge Diagnosis:  NSTEMI - No CAD at cath, EF 40% at echo   Secondary Discharge Diagnosis:    Chronic a-fib   S/P AVR-(tissue) 2011 at [facility] - no CABG with this   Cardiomyopathy- EF 40% with inf HK by recent echo   HTN- (transient hypotension during recent admission)   Hypothyroid   Obesity- BMI 45   SBO (small bowel obstruction)- recently off Eliquis for this   Cellulitis    Anemia   Consults: IM  Procedures: Cardiac catheterization, coronary arteriogram, Supravalvular aortography  Hospital Course: Melanie Camacho is a 80 y.o. female with no history of CAD. She lives in [place], [place] but has a daughter that works at [employer]. She is s/p AVR with a bioprosthetic valve, and has HTN, hypothyroid and recent SBO. Eliquis held during that hospitalization. She was to have an EGD and colon in [place], [place] and was off her Eliquis starting 04/20 for these procedures.   She was found unresponsive at her home and transported to [facility]. Troponin was as high at 33. EF 40% by echo. She stabilized and mental status improved. She was transferred to [facility] for further evaluation and catheterization.  Heart catheterization results are below. She had no CAD. She tolerated the procedure well.   An internal medicine consult was called to manage her other medical issues. She had some diarrhea, but C. Diff was negative and symptoms improved. Other laboratory results were reviewed. She has a slightly elevated TSH with a slightly low  free T3  but can follow up with her primary MD. She is encouraged to get a repeat TSH with a free T3 and T4 in 6 weeks, to allow her to recover from her acute illness. She is mildly anemic but is not symptomatic from  this. Prior labs are not available. Follow up with primary care for this is also recommended.   She had extensive ecchymosis on her left arm, from blood draws and IV sticks. There was concern for cellulitis, but a venous Doppler study showed no DVT or other vascular abnormality. However, because of extensive ecchymosis, she is to hold her Eliquis until 04/24 and preferably not restart it until her arm is improved.  On 04/22, she was seen by Dr. Thedore Mins and Dr. Tresa Endo. All data were reviewed. No further inpatient workup is indicated. She is to follow up with her primary care physician and decide on further evaluation for her unresponsive episode. Options were given to the patient and she will review these with family and her local physician before deciding.    Labs:   Lab Results  Component Value Date   WBC 5.4 12/28/2013   HGB 10.7* 12/28/2013   HCT 32.6* 12/28/2013   MCV 95.9 12/28/2013   PLT 277 12/28/2013     Recent Labs Lab 12/28/13 0725  NA 139  K 4.2  CL 105  CO2 21  BUN 9  CREATININE 0.91  CALCIUM 8.3*  GLUCOSE 82    Recent Labs  12/28/13 0714  INR 1.28   Lab Results  Component Value Date   TSH 8.270* 12/28/2013   FREE T3 2.1    FREE T4 1.03     Cardiac Cath: 12/28/2013 PROCEDURE: Coronary angiography and supravalvular aortography.  The  patient was brought to the second floor [place] [facility]iac cath lab in the postabsorptive state. She was premedicated with Versed 1 mg and fentanyl 25 mcg. She did not appear to have access to attempt a radial approach due to multiple infiltrations of her left arm and a bandage over her right wrist. Her right groin was prepped and shaved in usual sterile fashion. Xylocaine 1% was used for local anesthesia. The right femoral artery was punctured anteriorly with the first stent and a 5 French sheath was inserted without difficulty. Coronary angiography was performed with 5 French Judkins 4 left and right diagnostic catheters. With the patient  having an aortic valve replacement, this is was made not to cross the aortic valve. A 5 French pigtail catheter was inserted and advanced to the central aorta and supravalvular aortography was performed without difficulty. All catheters were removed and the patient. Hemostasis was obtained by direct pressure. She tolerated the procedure well and returned to her room in stable condition.  HEMODYNAMICS:  Central Aorta: 117/61  ANGIOGRAPHY:  1. Left main: Normal vessel, which bifurcated into the LAD and left circumflex artery.  2. LAD: Angiographically normal vessel, which gave rise to 2 diagonal vessels and several septal perforator arteries and extended to and wrapped around the LV apex  3. Left circumflex: Normal vessel, which gave rise to 2 marginal branches.  4. Right coronary artery: Very large, dominant, angiographically normal vessel, which give rise to a large PDA and large PLA vessel  Supravalvular aortography revealed a competent bioprosthetic aortic valve without significant aortic insufficiency.  IMPRESSION:  Normal appearing bioprosthetic aortic valve replacement without significant angiographic aortic insufficiency.  Normal coronary arteries  EKG: 12/28/2013 Atrial fib Vent. rate 97 BPM PR interval * ms QRS duration 90 ms QT/QTc 368/467 ms P-R-T axes * -8 204  FOLLOW UP PLANS AND APPOINTMENTS Allergies  Allergen Reactions  . Morphine And Related     Stevens-Johnsons  . Tape   . Bacitracin Rash  . Betadine [Povidone Iodine] Rash  . Clindamycin/Lincomycin Rash  . Vancomycin Rash     Medication List    STOP taking these medications       diltiazem 120 MG 24 hr capsule  Commonly known as:  DILACOR XR      TAKE these medications       apixaban 5 MG Tabs tablet  Commonly known as:  ELIQUIS  Take 1 tablet (5 mg total) by mouth 2 (two) times daily.  Start taking on:  12/31/2013     aspirin 81 MG EC tablet  Take 1 tablet (81 mg total) by mouth daily.      bisoprolol 5 MG tablet  Commonly known as:  ZEBETA  Take 1 tablet (5 mg total) by mouth daily.     clobetasol cream 0.05 %  Commonly known as:  TEMOVATE  Apply 1 application topically 2 (two) times daily.     doxycycline 100 MG tablet  Commonly known as:  VIBRA-TABS  Take 1 tablet (100 mg total) by mouth every 12 (twelve) hours.     fentaNYL 25 MCG/HR patch  Commonly known as:  DURAGESIC - dosed mcg/hr  Place 25 mcg onto the skin every 3 (three) days.     furosemide 40 MG tablet  Commonly known as:  LASIX  Take 40 mg by mouth 2 (two) times daily.     Linaclotide 290 MCG Caps capsule  Commonly known as:  LINZESS  Take 290 mcg by mouth daily.  multivitamin with minerals tablet  Take 1 tablet by mouth daily.     ICAPS Caps  Take 1 capsule by mouth daily.     OVER THE COUNTER MEDICATION  Take 1 capsule by mouth daily. Hair, Skin and Nails     oxybutynin 5 MG 24 hr tablet  Commonly known as:  DITROPAN-XL  Take 5 mg by mouth daily.     potassium chloride SA 20 MEQ tablet  Commonly known as:  K-DUR,KLOR-CON  Take 20 mEq by mouth 2 (two) times daily.     Vitamin D3 5000 UNITS Caps  Take 5,000 Units by mouth daily.     WELCHOL 625 MG tablet  Generic drug:  colesevelam  Take 1,875 mg by mouth 2 (two) times daily with a meal.        Discharge Orders   Future Orders Complete By Expires   Diet - low sodium heart healthy  As directed    Increase activity slowly  As directed      Follow-up Information   Follow up with Cassell Clement, MD. (As needed)    Specialty:  Cardiology   Contact information:   [address] [place] [address] [phone]       BRING ALL MEDICATIONS WITH YOU TO FOLLOW UP APPOINTMENTS  Time spent with patient to include physician time: 49 min Signed: Darrol Jump, PA-C 12/29/2013, 2:48 PM Co-Sign MD

## 2013-12-29 NOTE — Progress Notes (Signed)
Hospitalist consult note                                        Patient Demographics  Melanie Camacho, is a 80 y.o. female, DOB - [birth year], GNF:[id number]  Admit date - 12/27/2013   Admitting Physician Cassell Clement, MD  Outpatient Primary MD for the patient is PROVIDER NOT IN SYSTEM  LOS - 2   Chief complaint. NSTEMI      Assessment & Plan    Left fore arm erythema and bruise. She is very friable skin and capillaries, she had a infiltrated IV site which resulted in the erythema, also has bruise from pressure caused by the blood pressure cuff, doubt there is active cellulitis. Venous Doppler stable of the left upper extremity.   At this time will advise continuing the elevation of the left upper extremity, since bruise is enlarging have requested her to stay off of anticoagulation for a week, low-dose aspirin should be okay with close monitoring of the left arm by PCP. We'll stop IV antibiotics if there is any cellulitis is minimal one week of oral doxycycline should suffice.    2. Watery diarrhea:  - Negative for C. difficile, as needed Imodium can be used, currently diarrhea free.    3. Restless leg syndrome:  - start her ropinorole     4. Chronic hypotension. - This is baseline for patient, confirmed by patient and her daughter bedside, she is symptom-free. Continue to monitor supportive care as needed.    5. NSTEMI and atrial fibrillation.   Primary team which is cardiology.    7. Abnormal TSH:  In the setting of acute illness, we'll request PCP to repeat TSH along with free T3 and T4 in 2 weeks time.    8. Anemia:  A. of chronic disease. Outpatient followup with PCP post discharge no inpatient workup required.     Code Status: Full  Family Communication: daughter bedside     Medications  Scheduled Meds: . apixaban  5 mg Oral BID  . aspirin EC  81 mg Oral Daily  . bisoprolol  5 mg Oral Daily  . cholecalciferol  5,000 Units Oral Daily  . clobetasol cream  1 application Topical BID  . colesevelam  1,875 mg Oral BID WC  . doxycycline  100 mg Oral Q12H  . fentaNYL  25 mcg Transdermal Q72H  . furosemide  40 mg Oral BID  . Linaclotide  290 mcg Oral Daily  . multivitamin-lutein  1 capsule Oral Daily  . oxybutynin  5 mg Oral Daily  . pantoprazole  40 mg Oral Daily  . potassium chloride SA  20 mEq Oral BID  . rOPINIRole  0.25 mg Oral QHS   Continuous Infusions: . sodium chloride 125 mL/hr at 12/28/13 1420   PRN Meds:.acetaminophen, nitroGLYCERIN, ondansetron (ZOFRAN) IV  DVT Prophylaxis   SCDs   Lab Results  Component Value Date   PLT 277 12/28/2013    Antibiotics    Anti-infectives   Start     Dose/Rate Route Frequency Ordered Stop   12/29/13 1030  doxycycline (VIBRA-TABS) tablet 100 mg     100 mg Oral Every 12 hours 12/29/13 1027  12/28/13 2200  Ampicillin-Sulbactam (UNASYN) 3 g in sodium chloride 0.9 % 100 mL IVPB  Status:  Discontinued     3 g 100 mL/hr over 60 Minutes Intravenous Every 6 hours 12/28/13 2118 12/29/13 1027          Subjective:   Melanie Camacho today has, No headache, No chest pain, No abdominal pain - No Nausea, No new weakness tingling or numbness, No Cough - SOB.    Objective:   Filed Vitals:   12/28/13 2343 12/29/13 0100 12/29/13 0453 12/29/13 0747  BP: 108/32  103/43 97/41  Pulse: 89  81 77  Temp: 98.9 F (37.2 C)  98 F (36.7 C) 98.1 F (36.7 C)  TempSrc: Oral  Oral Oral  Resp: 17  20 18   Height:      Weight:  87.7 kg (193 lb 5.5 oz)    SpO2: 96%  92% 93%    Wt Readings from Last 3 Encounters:  12/29/13 87.7 kg (193 lb 5.5 oz)  12/29/13 87.7 kg (193 lb 5.5 oz)     Intake/Output Summary (Last 24 hours) at 12/29/13 1028 Last data filed at 12/29/13 0508  Gross per 24 hour  Intake 1003.92 ml    Output   1000 ml  Net   3.92 ml     Physical Exam  Awake Alert, Oriented X 3, No new F.N deficits, Normal affect [place].AT,PERRAL Supple Neck,No JVD, No cervical lymphadenopathy appriciated.  Symmetrical Chest wall movement, Good air movement bilaterally, CTAB RRR,No Gallops,Rubs or new Murmurs, No Parasternal Heave +ve B.Sounds, Abd Soft, Non tender, No organomegaly appriciated, No rebound - guarding or rigidity. No Cyanosis, Clubbing or edema, No new Rash or bruise, bruise and both upper extremities left has diffuse bruise on the previous IV site, right arm has a small bruise on the last IV site    Data Review   Micro Results Recent Results (from the past 240 hour(s))  MRSA PCR SCREENING     Status: None   Collection Time    12/27/13  5:37 PM      Result Value Ref Range Status   MRSA by PCR NEGATIVE  NEGATIVE Final   Comment:            The GeneXpert MRSA Assay (FDA     approved for NASAL specimens     only), is one component of a     comprehensive MRSA colonization     surveillance program. It is not     intended to diagnose MRSA     infection nor to guide or     monitor treatment for     MRSA infections.  CLOSTRIDIUM DIFFICILE BY PCR     Status: None   Collection Time    12/28/13  5:59 PM      Result Value Ref Range Status   C difficile by pcr NEGATIVE  NEGATIVE Final    Radiology Reports No results found.  CBC  Recent Labs Lab 12/28/13 0725  WBC 5.4  HGB 10.7*  HCT 32.6*  PLT 277  MCV 95.9  MCH 31.5  MCHC 32.8  RDW 15.3    Chemistries   Recent Labs Lab 12/28/13 0725  NA 139  K 4.2  CL 105  CO2 21  GLUCOSE 82  BUN 9  CREATININE 0.91  CALCIUM 8.3*   ------------------------------------------------------------------------------------------------------------------ estimated creatinine clearance is 50.5 ml/min (by C-G formula based on Cr of  0.91). ------------------------------------------------------------------------------------------------------------------ No results found for this basename: HGBA1C,  in the  last 72 hours ------------------------------------------------------------------------------------------------------------------ No results found for this basename: CHOL, HDL, LDLCALC, TRIG, CHOLHDL, LDLDIRECT,  in the last 72 hours ------------------------------------------------------------------------------------------------------------------  Recent Labs  12/28/13 0714  TSH 8.270*   ------------------------------------------------------------------------------------------------------------------ No results found for this basename: VITAMINB12, FOLATE, FERRITIN, TIBC, IRON, RETICCTPCT,  in the last 72 hours  Coagulation profile  Recent Labs Lab 12/28/13 0714  INR 1.28    No results found for this basename: DDIMER,  in the last 72 hours  Cardiac Enzymes No results found for this basename: CK, CKMB, TROPONINI, MYOGLOBIN,  in the last 168 hours ------------------------------------------------------------------------------------------------------------------ No components found with this basename: POCBNP,      Time Spent in minutes  35   Leroy SeaPrashant K Singh M.D on 12/29/2013 at 10:28 AM  Between 7am to 7pm - Pager - [phone]  After 7pm go to [email or web address] - password [email or web address]  And look for the night coverage person covering for me after hours  [facility] Office  [phone]

## 2013-12-29 NOTE — Progress Notes (Signed)
*  PRELIMINARY RESULTS* Vascular Ultrasound Left upper extremity venous duplex has been completed.  Preliminary findings: No evidence of deep or superficial thrombosis.   Farrel Demark, RDMS, RVT  12/29/2013, 10:27 AM

## 2014-01-12 ENCOUNTER — Encounter: Payer: Self-pay | Admitting: Cardiovascular Disease

## 2014-08-18 ENCOUNTER — Encounter (HOSPITAL_COMMUNITY): Payer: Self-pay | Admitting: Cardiovascular Disease

## 2014-12-14 ENCOUNTER — Emergency Department (HOSPITAL_COMMUNITY): Payer: Medicare Other

## 2014-12-14 ENCOUNTER — Encounter (HOSPITAL_COMMUNITY): Payer: Self-pay | Admitting: Family Medicine

## 2014-12-14 ENCOUNTER — Other Ambulatory Visit (HOSPITAL_COMMUNITY): Payer: Self-pay

## 2014-12-14 ENCOUNTER — Inpatient Hospital Stay (HOSPITAL_COMMUNITY)
Admission: EM | Admit: 2014-12-14 | Discharge: 2014-12-30 | DRG: 292 | Disposition: A | Discharge status: Skilled Nursing Facility | Payer: Medicare Other | Attending: Internal Medicine | Admitting: Internal Medicine

## 2014-12-14 DIAGNOSIS — Z953 Presence of xenogenic heart valve: Secondary | ICD-10-CM | POA: Diagnosis not present

## 2014-12-14 DIAGNOSIS — Z6841 Body Mass Index (BMI) 40.0 and over, adult: Secondary | ICD-10-CM

## 2014-12-14 DIAGNOSIS — Z79899 Other long term (current) drug therapy: Secondary | ICD-10-CM | POA: Diagnosis not present

## 2014-12-14 DIAGNOSIS — I482 Chronic atrial fibrillation, unspecified: Secondary | ICD-10-CM | POA: Diagnosis present

## 2014-12-14 DIAGNOSIS — E875 Hyperkalemia: Secondary | ICD-10-CM | POA: Diagnosis present

## 2014-12-14 DIAGNOSIS — D539 Nutritional anemia, unspecified: Secondary | ICD-10-CM | POA: Diagnosis present

## 2014-12-14 DIAGNOSIS — I509 Heart failure, unspecified: Secondary | ICD-10-CM

## 2014-12-14 DIAGNOSIS — E039 Hypothyroidism, unspecified: Secondary | ICD-10-CM | POA: Diagnosis present

## 2014-12-14 DIAGNOSIS — N179 Acute kidney failure, unspecified: Secondary | ICD-10-CM | POA: Diagnosis not present

## 2014-12-14 DIAGNOSIS — R031 Nonspecific low blood-pressure reading: Secondary | ICD-10-CM | POA: Diagnosis present

## 2014-12-14 DIAGNOSIS — I10 Essential (primary) hypertension: Secondary | ICD-10-CM | POA: Diagnosis present

## 2014-12-14 DIAGNOSIS — R600 Localized edema: Secondary | ICD-10-CM | POA: Diagnosis not present

## 2014-12-14 DIAGNOSIS — E8809 Other disorders of plasma-protein metabolism, not elsewhere classified: Secondary | ICD-10-CM | POA: Diagnosis present

## 2014-12-14 DIAGNOSIS — E669 Obesity, unspecified: Secondary | ICD-10-CM | POA: Diagnosis present

## 2014-12-14 DIAGNOSIS — R5381 Other malaise: Secondary | ICD-10-CM | POA: Diagnosis not present

## 2014-12-14 DIAGNOSIS — I2510 Atherosclerotic heart disease of native coronary artery without angina pectoris: Secondary | ICD-10-CM | POA: Diagnosis present

## 2014-12-14 DIAGNOSIS — Z954 Presence of other heart-valve replacement: Secondary | ICD-10-CM | POA: Diagnosis not present

## 2014-12-14 DIAGNOSIS — I872 Venous insufficiency (chronic) (peripheral): Secondary | ICD-10-CM | POA: Insufficient documentation

## 2014-12-14 DIAGNOSIS — N183 Chronic kidney disease, stage 3 unspecified: Secondary | ICD-10-CM | POA: Insufficient documentation

## 2014-12-14 DIAGNOSIS — I129 Hypertensive chronic kidney disease with stage 1 through stage 4 chronic kidney disease, or unspecified chronic kidney disease: Secondary | ICD-10-CM | POA: Diagnosis present

## 2014-12-14 DIAGNOSIS — E119 Type 2 diabetes mellitus without complications: Secondary | ICD-10-CM | POA: Diagnosis present

## 2014-12-14 DIAGNOSIS — E876 Hypokalemia: Secondary | ICD-10-CM | POA: Diagnosis not present

## 2014-12-14 DIAGNOSIS — Z7901 Long term (current) use of anticoagulants: Secondary | ICD-10-CM

## 2014-12-14 DIAGNOSIS — R079 Chest pain, unspecified: Secondary | ICD-10-CM | POA: Diagnosis present

## 2014-12-14 DIAGNOSIS — R0902 Hypoxemia: Secondary | ICD-10-CM | POA: Diagnosis present

## 2014-12-14 DIAGNOSIS — I5033 Acute on chronic diastolic (congestive) heart failure: Secondary | ICD-10-CM | POA: Diagnosis present

## 2014-12-14 DIAGNOSIS — E109 Type 1 diabetes mellitus without complications: Secondary | ICD-10-CM | POA: Diagnosis not present

## 2014-12-14 DIAGNOSIS — R0600 Dyspnea, unspecified: Secondary | ICD-10-CM | POA: Diagnosis present

## 2014-12-14 DIAGNOSIS — Z952 Presence of prosthetic heart valve: Secondary | ICD-10-CM

## 2014-12-14 HISTORY — DX: Other complications of anesthesia, initial encounter: T88.59XA

## 2014-12-14 HISTORY — DX: Pneumonia, unspecified organism: J18.9

## 2014-12-14 HISTORY — DX: Personal history of other medical treatment: Z92.89

## 2014-12-14 HISTORY — DX: Pure hypercholesterolemia, unspecified: E78.00

## 2014-12-14 HISTORY — DX: Gastro-esophageal reflux disease without esophagitis: K21.9

## 2014-12-14 HISTORY — DX: Anemia, unspecified: D64.9

## 2014-12-14 HISTORY — DX: Adverse effect of unspecified anesthetic, initial encounter: T41.45XA

## 2014-12-14 HISTORY — DX: Acute myocardial infarction, unspecified: I21.9

## 2014-12-14 HISTORY — DX: Type 2 diabetes mellitus without complications: E11.9

## 2014-12-14 HISTORY — DX: Unspecified osteoarthritis, unspecified site: M19.90

## 2014-12-14 HISTORY — DX: Low back pain: M54.5

## 2014-12-14 HISTORY — DX: Low back pain, unspecified: M54.50

## 2014-12-14 HISTORY — DX: Other chronic pain: G89.29

## 2014-12-14 HISTORY — DX: Nonspecific reaction to tuberculin skin test without active tuberculosis: R76.11

## 2014-12-14 LAB — BASIC METABOLIC PANEL
ANION GAP: 8 (ref 5–15)
BUN: 33 mg/dL — ABNORMAL HIGH (ref 6–23)
CHLORIDE: 107 mmol/L (ref 96–112)
CO2: 24 mmol/L (ref 19–32)
CREATININE: 1.33 mg/dL — AB (ref 0.50–1.10)
Calcium: 9.1 mg/dL (ref 8.4–10.5)
GFR calc Af Amer: 43 mL/min — ABNORMAL LOW (ref >90)
GFR calc non Af Amer: 37 mL/min — ABNORMAL LOW (ref >90)
Glucose, Bld: 82 mg/dL (ref 70–99)
POTASSIUM: 5.9 mmol/L — AB (ref 3.5–5.1)
Sodium: 139 mmol/L (ref 135–145)

## 2014-12-14 LAB — CBC
HCT: 35.7 % — ABNORMAL LOW (ref 36.0–46.0)
HEMOGLOBIN: 10.9 g/dL — AB (ref 12.0–15.0)
MCH: 31.1 pg (ref 26.0–34.0)
MCHC: 30.5 g/dL (ref 30.0–36.0)
MCV: 101.7 fL — ABNORMAL HIGH (ref 78.0–100.0)
Platelets: 201 10*3/uL (ref 150–400)
RBC: 3.51 MIL/uL — AB (ref 3.87–5.11)
RDW: 16.9 % — ABNORMAL HIGH (ref 11.5–15.5)
WBC: 4.2 10*3/uL (ref 4.0–10.5)

## 2014-12-14 LAB — TROPONIN I
TROPONIN I: 0.05 ng/mL — AB (ref <0.031)
Troponin I: 0.04 ng/mL — ABNORMAL HIGH (ref <0.031)

## 2014-12-14 LAB — BRAIN NATRIURETIC PEPTIDE: B Natriuretic Peptide: 1222.9 pg/mL — ABNORMAL HIGH (ref 0.0–100.0)

## 2014-12-14 LAB — I-STAT TROPONIN, ED: Troponin i, poc: 0.04 ng/mL (ref 0.00–0.08)

## 2014-12-14 LAB — MRSA PCR SCREENING: MRSA by PCR: NEGATIVE

## 2014-12-14 MED ORDER — FUROSEMIDE 10 MG/ML IJ SOLN
40.0000 mg | Freq: Once | INTRAMUSCULAR | Status: AC
  Administered 2014-12-14: 40 mg via INTRAVENOUS
  Filled 2014-12-14: qty 4

## 2014-12-14 MED ORDER — ONDANSETRON HCL 4 MG/2ML IJ SOLN: 4.0000 mg | Freq: Four times a day (QID) | INTRAMUSCULAR | Status: DC | PRN

## 2014-12-14 MED ORDER — APIXABAN 5 MG PO TABS
5.0000 mg | ORAL_TABLET | Freq: Two times a day (BID) | ORAL | Status: DC
  Administered 2014-12-14 – 2014-12-29 (×31): 5 mg via ORAL
  Filled 2014-12-14 (×31): qty 1

## 2014-12-14 MED ORDER — SODIUM CHLORIDE 0.9 % IJ SOLN
3.0000 mL | Freq: Two times a day (BID) | INTRAMUSCULAR | Status: DC
  Administered 2014-12-14 – 2014-12-30 (×28): 3 mL via INTRAVENOUS

## 2014-12-14 MED ORDER — NYSTATIN 100000 UNIT/GM EX CREA
TOPICAL_CREAM | Freq: Two times a day (BID) | CUTANEOUS | Status: DC
  Administered 2014-12-14 – 2014-12-17 (×7): via TOPICAL
  Administered 2014-12-18: 1 via TOPICAL
  Administered 2014-12-18: 22:00:00 via TOPICAL
  Administered 2014-12-19: 1 via TOPICAL
  Administered 2014-12-19 – 2014-12-20 (×2): via TOPICAL
  Administered 2014-12-20: 1 via TOPICAL
  Administered 2014-12-21 – 2014-12-23 (×4): via TOPICAL
  Administered 2014-12-23: 1 via TOPICAL
  Administered 2014-12-24 – 2014-12-27 (×7): via TOPICAL
  Administered 2014-12-28: 1 via TOPICAL
  Administered 2014-12-28 – 2014-12-29 (×2): via TOPICAL
  Administered 2014-12-29: 1 via TOPICAL
  Administered 2014-12-30: 10:00:00 via TOPICAL
  Filled 2014-12-14 (×3): qty 15

## 2014-12-14 MED ORDER — FUROSEMIDE 10 MG/ML IJ SOLN
60.0000 mg | Freq: Two times a day (BID) | INTRAMUSCULAR | Status: AC
Start: 2014-12-14 — End: 2014-12-16
  Administered 2014-12-15 (×2): 60 mg via INTRAVENOUS
  Filled 2014-12-14 (×4): qty 6

## 2014-12-14 MED ORDER — NITROGLYCERIN 0.4 MG SL SUBL: 0.4000 mg | SUBLINGUAL_TABLET | SUBLINGUAL | Status: DC | PRN

## 2014-12-14 MED ORDER — OXYCODONE HCL 5 MG PO CAPS
5.0000 mg | ORAL_CAPSULE | Freq: Four times a day (QID) | ORAL | Status: DC | PRN
  Filled 2014-12-14: qty 1

## 2014-12-14 MED ORDER — GABAPENTIN 300 MG PO CAPS
300.0000 mg | ORAL_CAPSULE | Freq: Two times a day (BID) | ORAL | Status: DC
  Administered 2014-12-14 – 2014-12-30 (×32): 300 mg via ORAL
  Filled 2014-12-14 (×35): qty 1

## 2014-12-14 MED ORDER — OCUVITE-LUTEIN PO CAPS
2.0000 | ORAL_CAPSULE | Freq: Every day | ORAL | Status: DC
  Administered 2014-12-16 – 2014-12-30 (×14): 2 via ORAL
  Filled 2014-12-14 (×17): qty 2

## 2014-12-14 MED ORDER — SODIUM CHLORIDE 0.9 % IV SOLN: 250.0000 mL | INTRAVENOUS | Status: DC | PRN

## 2014-12-14 MED ORDER — PANTOPRAZOLE SODIUM 40 MG IV SOLR
40.0000 mg | INTRAVENOUS | Status: DC
  Administered 2014-12-14: 40 mg via INTRAVENOUS
  Filled 2014-12-14: qty 40

## 2014-12-14 MED ORDER — ACETAMINOPHEN 325 MG PO TABS
650.0000 mg | ORAL_TABLET | ORAL | Status: DC | PRN
  Administered 2014-12-17 – 2014-12-27 (×5): 650 mg via ORAL
  Filled 2014-12-14 (×6): qty 2

## 2014-12-14 MED ORDER — HEPARIN SODIUM (PORCINE) 5000 UNIT/ML IJ SOLN: 5000.0000 [IU] | Freq: Three times a day (TID) | INTRAMUSCULAR | Status: DC

## 2014-12-14 MED ORDER — BISOPROLOL FUMARATE 5 MG PO TABS
5.0000 mg | ORAL_TABLET | Freq: Every day | ORAL | Status: DC
  Administered 2014-12-14 – 2014-12-30 (×16): 5 mg via ORAL
  Filled 2014-12-14 (×17): qty 1

## 2014-12-14 MED ORDER — ADULT MULTIVITAMIN W/MINERALS CH
1.0000 | ORAL_TABLET | Freq: Every day | ORAL | Status: DC
  Administered 2014-12-15 – 2014-12-30 (×16): 1 via ORAL
  Filled 2014-12-14 (×31): qty 1

## 2014-12-14 MED ORDER — COLESEVELAM HCL 625 MG PO TABS
1875.0000 mg | ORAL_TABLET | Freq: Two times a day (BID) | ORAL | Status: DC
  Administered 2014-12-15 – 2014-12-30 (×30): 1875 mg via ORAL
  Filled 2014-12-14 (×34): qty 3

## 2014-12-14 MED ORDER — SODIUM CHLORIDE 0.9 % IJ SOLN
3.0000 mL | INTRAMUSCULAR | Status: DC | PRN
  Administered 2014-12-23 – 2014-12-25 (×2): 3 mL via INTRAVENOUS
  Filled 2014-12-14 (×2): qty 3

## 2014-12-14 MED ORDER — ROPINIROLE HCL 0.5 MG PO TABS
0.2500 mg | ORAL_TABLET | Freq: Three times a day (TID) | ORAL | Status: DC | PRN
  Administered 2014-12-14 – 2014-12-29 (×8): 0.25 mg via ORAL
  Filled 2014-12-14 (×9): qty 1

## 2014-12-14 MED ORDER — GI COCKTAIL ~~LOC~~
30.0000 mL | Freq: Two times a day (BID) | ORAL | Status: DC | PRN
  Administered 2014-12-14: 30 mL via ORAL
  Filled 2014-12-14: qty 30

## 2014-12-14 NOTE — H&P (Signed)
[facility] History and Physical                                                                                    Melanie Camacho, is a 81 y.o. female  MRN: [id number]   DOB - 01/29/1932  Admit Date - 12/14/2014  Outpatient Primary MD for the patient is PROVIDER NOT IN SYSTEM Dr. Tedd Camacho in [place], [place]  With History of -  Past Medical History  Diagnosis Date  . Coronary artery disease   . Anginal pain   . Hypertension   . Heart murmur   . CHF (congestive heart failure)   . Shortness of breath       Past Surgical History  Procedure Laterality Date  . Left heart catheterization with coronary angiogram N/A 12/28/2013    Procedure: LEFT HEART CATHETERIZATION WITH CORONARY ANGIOGRAM;  Surgeon: Melanie Bihari, MD;  Location: [facility] CATH LAB;  Service: Cardiovascular;  Laterality: N/A;    in for   Chief Complaint  Patient presents with  . Shortness of Breath     HPI  Melanie Camacho  is a 81 y.o. female, with a past medical history of A. Fib, coronary artery disease with NSTEMI on 12/22/2013, aortic valve replacement (porcine tissue), diet controlled diabetes, hypertension, and hypothyroidism. She was recently hospitalized in [facility] on March 22-03/25/2015 for acute on chronic heart failure. Notes from that hospitalization indicate that she had gained 30 pounds and was subsequently started on metolazone. Her LVEF in December 2015 was 55-60%. Ms. Shenoy reports that since her discharge she has become progressively more short of breath. She has developed a non productive cough.  For the last 2 nights she has been unable to sleep and is unable to lie flat. This morning she was unable to walk across the floor to the bathroom due to dyspnea on exertion and her daughter insisted that she come to the emergency department. In the ER she was found to be hypoxic on room air with an oxygen saturation of 86%. She developed a tightness in the area of her left breast which feels better  after burping.  She has intermittent nausea.   Her BNP is elevated at 1222, and her potassium is 5.9. Chest x-ray shows evidence of congestive heart failure and cannot exclude pneumonia in the right base. Her daughter,Melanie Camacho is an Systems analyst here at [employer] and she requests a consultation with the congestive heart failure team.  Review of Systems   In addition to the HPI above,  No Fever-chills, No Headache, No changes with Vision or hearing, No problems swallowing food or Liquids, No Abdominal pain, No Nausea or Vomiting, Bowel movements are regular, No Blood in stool or Urine, No dysuria, No new skin rashes or bruises, No new joints pains-aches,  No new weakness, tingling, numbness in any extremity, No recent weight gain or loss, A full 10 point Review of Systems was done, except as stated above, all other Review of Systems were negative.  Social History History  Substance Use Topics  . Smoking status: Never Smoker   . Smokeless tobacco: Never Used  . Alcohol Use: No  Family History Sister died of an MI at age 41.  Mother died of a Post Op complications following cholecystectomy.   Prior to Admission medications   Medication Sig Start Date End Date Taking? Authorizing Provider  apixaban (ELIQUIS) 5 MG TABS tablet Take 1 tablet (5 mg total) by mouth 2 (two) times daily. 12/31/13  Yes Melanie G Barrett, PA-C  bisoprolol (ZEBETA) 5 MG tablet Take 1 tablet (5 mg total) by mouth daily. Patient taking differently: Take 10 mg by mouth daily.  12/29/13  Yes Melanie G Barrett, PA-C  Cholecalciferol (VITAMIN D3) 5000 UNITS CAPS Take 5,000 Units by mouth daily.   Yes Historical Provider, MD  colesevelam (WELCHOL) 625 MG tablet Take 1,875 mg by mouth 2 (two) times daily with a meal.   Yes Historical Provider, MD  furosemide (LASIX) 40 MG tablet Take 40-60 mg by mouth daily as needed for fluid.    Yes Historical Provider, MD  gabapentin (NEURONTIN) 300 MG capsule Take 300 mg by mouth 2 (two)  times daily.   Yes Historical Provider, MD  IRON PO Take 1 tablet by mouth daily.    Yes Historical Provider, MD  Multiple Vitamins-Minerals (ICAPS) CAPS Take 2 capsules by mouth daily.    Yes Historical Provider, MD  Multiple Vitamins-Minerals (MULTIVITAMIN WITH MINERALS) tablet Take 1 tablet by mouth daily.   Yes Historical Provider, MD  oxycodone (OXY-IR) 5 MG capsule Take 5 mg by mouth every 6 (six) hours as needed for pain.   Yes Historical Provider, MD  potassium chloride (K-DUR) 10 MEQ tablet Take 15 mEq by mouth daily.   Yes Historical Provider, MD    Allergies  Allergen Reactions  . Morphine And Related     Stevens-Johnsons  . Tape   . Bacitracin Rash  . Betadine [Povidone Iodine] Rash  . Clindamycin/Lincomycin Rash  . Vancomycin Rash    Physical Exam  Vitals  Blood pressure 119/76, pulse 40, temperature 97.4 F (36.3 C), temperature source Oral, resp. rate 21, weight 104.781 kg (231 lb), SpO2 93 %.   General: Obese pleasant female, lying in bed in NAD, on oxygen, speaking in short sentenances.  Psych:  Normal affect and insight, Not Suicidal or Homicidal, Awake Alert, Oriented X 3.  Neuro:   No F.N deficits, ALL C.Nerves Intact, Strength 5/5 all 4 extremities, Sensation intact all 4 extremities.  ENT:  Ears and Eyes appear Normal, Conjunctivae clear, PER. Moist oral mucosa without erythema or exudates.  Neck:  Supple, No lymphadenopathy appreciated  Respiratory:  Decreased breath sounds bilaterally, speaking in short sentences.  Cardiac: irreg irreg, No Murmurs, +jvd, +tight lower extremity edema bilaterally.    Abdomen:  Obese, Positive bowel sounds, Soft, Non tender, pannus is tight with fluid.    Skin:  Skin over LEs is tight and pink with areas of shallow pink ulceration.  Area under pannus is bright red on the right hand side.  Extremities:  Able to move all 4. 5/5 strength in each  Data Review  CBC  Recent Labs Lab 12/14/14 1410  WBC 4.2  HGB  10.9*  HCT 35.7*  PLT 201  MCV 101.7*  MCH 31.1  MCHC 30.5  RDW 16.9*    Chemistries   Recent Labs Lab 12/14/14 1410  NA 139  K 5.9*  CL 107  CO2 24  GLUCOSE 82  BUN 33*  CREATININE 1.33*  CALCIUM 9.1     Imaging results:   Dg Chest Port 1 View  12/14/2014   CLINICAL  DATA:  Shortness of Breath  EXAM: PORTABLE CHEST - 1 VIEW  COMPARISON:  None.  FINDINGS: There is cardiomegaly with pulmonary venous hypertension. There is a right pleural effusion with airspace consolidation in the right base. There is mild interstitial edema. No adenopathy. Bones are osteoporotic. Patient is status post median sternotomy.  IMPRESSION: Evidence of congestive heart failure. Cannot exclude superimposed pneumonia in the right base.   Electronically Signed   By: Bretta Bang III M.D.   On: 12/14/2014 14:37    My personal review of EKG: low voltage afib with prolonged QTc.   Assessment & Plan  Principal Problem:   Acute on chronic diastolic heart failure Active Problems:   Chronic a-fib   S/P AVR-(tissue) 2011 at [facility] - no CABG with this   HTN- (transient hypotension during recent admission)   Hypothyroid   Obesity- BMI 45   Acute on Chronic Diastolic Heart Failure LV EF in 12/15 55%. Recent admission at [facility] 3/22 - 3/25.  Lovey Newcomer, OR RN & Dtr, requested CHF team consultation. Careful diuresis as patient's BP is soft. Cardiology consulted.  Placed patient in Stepdown - with respiratory failure and suspicion she may need pressors.  Left sided chest tightness. Will cycle enzymes.  EKG shows no ST changes. GI cocktail prn.  IV protonix. Did not order SL nitro as the patient has been hypotensive.  Chronic Afib and hx of porcine aortic valve  Rate controlled and on eliquis. Patient reports 3 attempts at cardioversion that failure.  HTN Currently hypotensive.  Reduced bisoprolol to 5 mg.   Undergoing diuresis.  Hypothyroid  Continue levothyroxine.  Diabetes  Mellitus Diet controlled.  Legs with weeping ulcerations WOC requested. Nystatin to be applied to area of skin under pannus with yeast.   DVT Prophylaxis: Heparin   AM Labs Ordered, also please review Full Orders  Family Communication:   dtr Inetta Fermo at bedside.  dtr Melanie Camacho is an OR Charity fundraiser here at [employer].  Code Status:  Full code  Condition:  guarded  Time spent in minutes : [address],  PA-C on 12/14/2014 at 5:25 PM  Between 7am to 7pm - Pager - [phone]  After 7pm go to [email or web address] - password [email or web address]  And look for the night coverage person covering me after hours  [facility]

## 2014-12-14 NOTE — ED Notes (Signed)
Charge Nurse informed appt time @1747 

## 2014-12-14 NOTE — Progress Notes (Signed)
Pt to floor via stretcher, report taken from Sarah.

## 2014-12-14 NOTE — ED Provider Notes (Signed)
CSN: [id number]     Arrival date & time 12/14/14  1323 History   First MD Initiated Contact with Patient 12/14/14 1332     Chief Complaint  Patient presents with  . Shortness of Breath     (Consider location/radiation/quality/duration/timing/severity/associated sxs/prior Treatment) HPI Complains of shortness of breath progressively worsening over the past 2 weeks. Patient hospitalized in [facility] 2 weeks ago for congestive heart failure. She states dyspnea has become progressively worse. She cannot walk greater than a few feet without becoming dyspneic. She also gets around by wheelchair. Sleep sitting upright. No other associated symptoms. No fever no cough. She's been treated with her prescribed medicines however denies noncompliance with medications. No other associated symptoms Past Medical History  Diagnosis Date  . Coronary artery disease   . Anginal pain   . Hypertension   . Heart murmur   . CHF (congestive heart failure)   . Shortness of breath    Past Surgical History  Procedure Laterality Date  . Left heart catheterization with coronary angiogram N/A 12/28/2013    Procedure: LEFT HEART CATHETERIZATION WITH CORONARY ANGIOGRAM;  Surgeon: Lennette Bihari, MD;  Location: [facility] CATH LAB;  Service: Cardiovascular;  Laterality: N/A;   History reviewed. No pertinent family history. History  Substance Use Topics  . Smoking status: Never Smoker   . Smokeless tobacco: Never Used  . Alcohol Use: No   OB History    No data available     Review of Systems  Respiratory: Positive for shortness of breath.   Cardiovascular: Positive for leg swelling.  Musculoskeletal: Positive for gait problem.  All other systems reviewed and are negative.     Allergies  Morphine and related; Tape; Bacitracin; Betadine; Clindamycin/lincomycin; and Vancomycin  Home Medications   Prior to Admission medications   Medication Sig Start Date End Date Taking? Authorizing Provider   apixaban (ELIQUIS) 5 MG TABS tablet Take 1 tablet (5 mg total) by mouth 2 (two) times daily. 12/31/13   Rhonda G Barrett, PA-C  bisoprolol (ZEBETA) 5 MG tablet Take 1 tablet (5 mg total) by mouth daily. 12/29/13   Rhonda G Barrett, PA-C  Cholecalciferol (VITAMIN D3) 5000 UNITS CAPS Take 5,000 Units by mouth daily.    Historical Provider, MD  clobetasol cream (TEMOVATE) 0.05 % Apply 1 application topically 2 (two) times daily.    Historical Provider, MD  colesevelam (WELCHOL) 625 MG tablet Take 1,875 mg by mouth 2 (two) times daily with a meal.    Historical Provider, MD  doxycycline (VIBRA-TABS) 100 MG tablet Take 1 tablet (100 mg total) by mouth every 12 (twelve) hours. 12/29/13   Rhonda G Barrett, PA-C  fentaNYL (DURAGESIC - DOSED MCG/HR) 25 MCG/HR patch Place 25 mcg onto the skin every 3 (three) days.    Historical Provider, MD  furosemide (LASIX) 40 MG tablet Take 40 mg by mouth 2 (two) times daily.    Historical Provider, MD  Linaclotide Karlene Einstein) 290 MCG CAPS capsule Take 290 mcg by mouth daily.    Historical Provider, MD  Multiple Vitamins-Minerals (ICAPS) CAPS Take 1 capsule by mouth daily.    Historical Provider, MD  Multiple Vitamins-Minerals (MULTIVITAMIN WITH MINERALS) tablet Take 1 tablet by mouth daily.    Historical Provider, MD  OVER THE COUNTER MEDICATION Take 1 capsule by mouth daily. Hair, Skin and Nails    Historical Provider, MD  oxybutynin (DITROPAN-XL) 5 MG 24 hr tablet Take 5 mg by mouth daily.    Historical Provider, MD  potassium chloride SA (K-DUR,KLOR-CON) 20 MEQ tablet Take 20 mEq by mouth 2 (two) times daily.    Historical Provider, MD   BP 115/58 mmHg  Pulse 80  Temp(Src) 97.4 F (36.3 C) (Oral)  Resp 24  Wt 231 lb (104.781 kg)  SpO2 95% Physical Exam  Constitutional: She appears well-developed and well-nourished. No distress.  HENT:  Head: Normocephalic and atraumatic.  Eyes: Conjunctivae are normal. Pupils are equal, round, and reactive to light.  Neck: Neck  supple. No tracheal deviation present. No thyromegaly present.  Cardiovascular: Normal rate and regular rhythm.   No murmur heard. Pulmonary/Chest: Effort normal.  Diminished breath sounds right side posteriorly  Abdominal: Soft. Bowel sounds are normal. She exhibits no distension. There is no tenderness.  Obese  Musculoskeletal: Normal range of motion. She exhibits edema. She exhibits no tenderness.  2+ pretibial pitting edema bilaterally  Neurological: She is alert. Coordination normal.  Skin: Skin is warm and dry. No rash noted.  Psychiatric: She has a normal mood and affect.  Nursing note and vitals reviewed.   ED Course  Procedures (including critical care time) Labs Review Labs Reviewed  CBC  BASIC METABOLIC PANEL  BRAIN NATRIURETIC PEPTIDE  I-STAT TROPOININ, ED    Imaging Review No results found.   EKG Interpretation   Date/Time:  Wednesday December 14 2014 14:04:09 EDT Ventricular Rate:  86 PR Interval:    QRS Duration: 77 QT Interval:  483 QTC Calculation: 578 R Axis:   -98 Text Interpretation:  Right and left arm electrode reversal,  interpretation assumes no reversal Atrial fibrillation Low voltage,  extremity and precordial leads Nonspecific T abnormalities, lateral leads  Prolonged QT interval Confirmed by Ethelda Chick  MD, Ameria Sanjurjo [phone]) on 12/14/2014  2:43:26 PM     Chest x-ray viewed by me. A she became hypoxic with pulse oximetry becoming 86% on room air when using the bathroom Results for orders placed or performed during the hospital encounter of 12/14/14  CBC  Result Value Ref Range   WBC 4.2 4.0 - 10.5 K/uL   RBC 3.51 (L) 3.87 - 5.11 MIL/uL   Hemoglobin 10.9 (L) 12.0 - 15.0 g/dL   HCT 34.7 (L) 42.5 - 95.6 %   MCV 101.7 (H) 78.0 - 100.0 fL   MCH 31.1 26.0 - 34.0 pg   MCHC 30.5 30.0 - 36.0 g/dL   RDW 38.7 (H) 56.4 - 33.2 %   Platelets 201 150 - 400 K/uL  Basic metabolic panel  Result Value Ref Range   Sodium 139 135 - 145 mmol/L   Potassium 5.9  (H) 3.5 - 5.1 mmol/L   Chloride 107 96 - 112 mmol/L   CO2 24 19 - 32 mmol/L   Glucose, Bld 82 70 - 99 mg/dL   BUN 33 (H) 6 - 23 mg/dL   Creatinine, Ser 9.51 (H) 0.50 - 1.10 mg/dL   Calcium 9.1 8.4 - 88.4 mg/dL   GFR calc non Af Amer 37 (L) >90 mL/min   GFR calc Af Amer 43 (L) >90 mL/min   Anion gap 8 5 - 15  BNP (order ONLY if patient complains of dyspnea/SOB AND you have documented it for THIS visit)  Result Value Ref Range   B Natriuretic Peptide 1222.9 (H) 0.0 - 100.0 pg/mL  I-stat troponin, ED (not at [facility])  Result Value Ref Range   Troponin i, poc 0.04 0.00 - 0.08 ng/mL   Comment 3           Dg Chest  Port 1 View  12/14/2014   CLINICAL DATA:  Shortness of Breath  EXAM: PORTABLE CHEST - 1 VIEW  COMPARISON:  None.  FINDINGS: There is cardiomegaly with pulmonary venous hypertension. There is a right pleural effusion with airspace consolidation in the right base. There is mild interstitial edema. No adenopathy. Bones are osteoporotic. Patient is status post median sternotomy.  IMPRESSION: Evidence of congestive heart failure. Cannot exclude superimposed pneumonia in the right base.   Electronically Signed   By: Bretta Bang III M.D.   On: 12/14/2014 14:37    MDM  Doubt pneumonia. No cough no fever no leukocytosis. Elevated BNP consistent with congestive heart failure. Plan oxygen, diuresis, will withhold nitrates as patient mildly hypotensive. MDMFinal diagnoses:  None    spoke with hospitalist service who will arrange for admission Diagnosis #1 congestive heart failure #2 hypoxia #3 anemia #4 mild hyperkalemia #5 renal insufficiency    Doug Sou, MD 12/14/14 1549

## 2014-12-14 NOTE — Progress Notes (Signed)
Cardiologist on call paged for patient request of continuation of home med ropinirole 0.25mg  PRN for restless leg syndrome.

## 2014-12-14 NOTE — Consult Note (Signed)
Patient ID: Melanie Camacho MRN: [id number], DOB/AGE: November 11, 1931   Admit date: 12/14/2014   Primary Physician: PROVIDER NOT IN SYSTEM Primary Cardiologist: Seen by Dr. Tresa Endo in the past (lost to follow-up).   Pt. Profile:  81 y/o female with h/o tissue AVR in 2011, normal coronaries by cath in 2015, chronic atrial fibrillation on Eliquis, HTN and DM, admitted for acute CHF and chest pain.   Problem List  Past Medical History  Diagnosis Date  . Coronary artery disease   . Anginal pain   . Hypertension   . Heart murmur   . CHF (congestive heart failure)   . Shortness of breath     Past Surgical History  Procedure Laterality Date  . Left heart catheterization with coronary angiogram N/A 12/28/2013    Procedure: LEFT HEART CATHETERIZATION WITH CORONARY ANGIOGRAM;  Surgeon: Lennette Bihari, MD;  Location: [facility] CATH LAB;  Service: Cardiovascular;  Laterality: N/A;     Allergies  Allergies  Allergen Reactions  . Morphine And Related     Stevens-Johnsons  . Tape   . Bacitracin Rash  . Betadine [Povidone Iodine] Rash  . Clindamycin/Lincomycin Rash  . Vancomycin Rash    HPI  The patient is a 81 year old female from [place]. 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 She has a history of HTN, DM, chronic atrial fibrillation on Eliquis and h/o aortic valve replacement with a tissue valve in 2011 at [facility]. At that time she had apparently normal coronary arteries. In 2015, she was hospitalized in [facility] with a small bowel obstruction, which was treated conservatively. Due to need for GI w/u, she was off of Eliquis for 4-5 days during that hospitalization.  Afterwards, she was discharged then found at home in a chair unresponsive by her daughter.  There was a question of an embolic etiology to an MI with a peak troponin of 33. An echo at the outside hospital showed an ejection fraction of 40% with inferior hypokinesis. The patient was transported to [facility] for definitive cardiac catheterization. LHC 12/28/13 by Dr. Tresa Endo showed normal coronaries with normal appearing bioprosthetic aortic valve replacement without significant angiographic aortic insufficiency. No further work-up was indicated. Since that time, she has failed to follow-up with a cardiologist.   She now reports that 3 weeks ago she developed dyspnea and weight gain. She was treated at [facility] for acute CHF exacerbation. Per records, BNP was elevated at 873. Also indicated in her records was a 2D echo 08/2014 demonstrating normal LVF with EF of 55-60%. This was not repeated. She was treated with IV Lasix. At time of discharge, she was sent home on Lasix and PRN Metolazone. Since that time she has continued to experience progressive weight gain, LE and abdominal edema, dyspnea, orthopnea, PND and weakness. Today, she developed substernal chest tightness with associated belching. Non radiating. Relived with belching but no exacerbating factors. Denies palpitations, diaphoresis, vomiting, syncope/ near syncope. She reports full medication compliance and adherence to a low sodium diet.   In [facility] ED, EKG shows atrial fibrillation with a slow ventricular response in the upper 50s low 60s. BNP is elevated at 1222.9. CXR show evidence for CHF. RLL PNA cannot be excluded. Physical exam is notable for significant peripheral edema. Initial troponin is minimally elevated at 0.05. K is 5.9. Hgb 10.9. Scr mildly elevated at 1.33. She is being admitted by IM. She has received 80 mg of IV Lasix as well  as IV Protonix and GI cocktail.    Home Medications  Prior to Admission medications   Medication Sig Start Date End Date Taking? Authorizing Provider  apixaban (ELIQUIS) 5 MG TABS tablet Take 1 tablet (5 mg total) by mouth 2 (two) times daily. 12/31/13  Yes Rhonda G Barrett, PA-C  bisoprolol (ZEBETA) 5 MG tablet Take 1 tablet (5 mg total) by mouth daily. Patient taking differently: Take 10 mg by  mouth daily.  12/29/13  Yes Rhonda G Barrett, PA-C  Cholecalciferol (VITAMIN D3) 5000 UNITS CAPS Take 5,000 Units by mouth daily.   Yes Historical Provider, MD  colesevelam (WELCHOL) 625 MG tablet Take 1,875 mg by mouth 2 (two) times daily with a meal.   Yes Historical Provider, MD  furosemide (LASIX) 40 MG tablet Take 40-60 mg by mouth daily as needed for fluid.    Yes Historical Provider, MD  gabapentin (NEURONTIN) 300 MG capsule Take 300 mg by mouth 2 (two) times daily.   Yes Historical Provider, MD  IRON PO Take 1 tablet by mouth daily. 65mg    Yes Historical Provider, MD  Multiple Vitamins-Minerals (ICAPS) CAPS Take 2 capsules by mouth daily.    Yes Historical Provider, MD  Multiple Vitamins-Minerals (MULTIVITAMIN WITH MINERALS) tablet Take 1 tablet by mouth daily.   Yes Historical Provider, MD  oxycodone (OXY-IR) 5 MG capsule Take 5 mg by mouth every 6 (six) hours as needed for pain.   Yes Historical Provider, MD  potassium chloride (K-DUR) 10 MEQ tablet Take 15 mEq by mouth daily.   Yes Historical Provider, MD    Family History  Family History  Problem Relation Age of Onset  . Hypertension Mother     Social History  History   Social History  . Marital Status: Widowed    Spouse Name: N/A  . Number of Children: N/A  . Years of Education: N/A   Occupational History  . Not on file.   Social History Main Topics  . Smoking status: Never Smoker   . Smokeless tobacco: Never Used  . Alcohol Use: No  . Drug Use: No  . Sexual Activity: No   Other Topics Concern  . Not on file   Social History Narrative     Review of Systems General:  No chills, fever, night sweats or weight changes.  Cardiovascular:  No chest pain, dyspnea on exertion, edema, orthopnea, palpitations, paroxysmal nocturnal dyspnea. Dermatological: No rash, lesions/masses Respiratory: No cough, dyspnea Urologic: No hematuria, dysuria Abdominal:   No nausea, vomiting, diarrhea, bright red blood per rectum,  melena, or hematemesis Neurologic:  No visual changes, wkns, changes in mental status. All other systems reviewed and are otherwise negative except as noted above.  Physical Exam  Blood pressure 115/58, pulse 40, temperature 97.4 F (36.3 C), temperature source Oral, resp. rate 17, weight 231 lb (104.781 kg), SpO2 93 %.  General: Pleasant, NAD Psych: Normal affect. Neuro: Alert and oriented X 3. Moves all extremities spontaneously. HEENT: Normal  Neck: Supple without bruits; elevated JVD at level of ear Lungs:  Decreased breath sounds at the bases with faint basilar rales Heart: irregularly irregular rhythm no s3, s4, or murmurs. Abdomen: Soft, non-tender, + abdominal edema, BS + x 4.  Extremities: No clubbing, or cyanosis. 2+ wheeping bilateral LEE. DP/PT/Radials 2+ and equal bilaterally.  Labs  Troponin [facility] of Care Test)  Recent Labs  12/14/14 1420  TROPIPOC 0.04    Recent Labs  12/14/14 1616  TROPONINI 0.05*   Lab Results  Component Value Date   WBC 4.2 12/14/2014   HGB 10.9* 12/14/2014   HCT 35.7* 12/14/2014   MCV 101.7* 12/14/2014   PLT 201 12/14/2014     Recent Labs Lab 12/14/14 1410  NA 139  K 5.9*  CL 107  CO2 24  BUN 33*  CREATININE 1.33*  CALCIUM 9.1  GLUCOSE 82   No results found for: CHOL, HDL, LDLCALC, TRIG No results found for: DDIMER   Radiology/Studies  Dg Chest Port 1 View  12/14/2014   CLINICAL DATA:  Shortness of Breath  EXAM: PORTABLE CHEST - 1 VIEW  COMPARISON:  None.  FINDINGS: There is cardiomegaly with pulmonary venous hypertension. There is a right pleural effusion with airspace consolidation in the right base. There is mild interstitial edema. No adenopathy. Bones are osteoporotic. Patient is status post median sternotomy.  IMPRESSION: Evidence of congestive heart failure. Cannot exclude superimposed pneumonia in the right base.   Electronically Signed   By: Bretta Bang III M.D.   On: 12/14/2014 14:37    ECG  Atrial  Fibrillation w/ slow ventricular response.    ASSESSMENT AND PLAN   1. Acute on Chronic Diastolic CHF: h/o of normal LVF by echo at outside hospital 08/2014 with EF at 55-60%. She is significantly volume overloaded with evidence of pulmonic rales, JVD and peripheral edema. Also with dyspnea, PND and orthopnea. BNP elevated. Agree with IV diuretic therapy and foley placement. Strict I/Os, daily weights and low sodium diet. Agree with 2D echo to assess LVF, wall motion and status of aortic valve. Continue BB therapy. Check TSH.   2. Chest Pain: EKG shows chronic afib that is rate controlled. No ischemic abnormalities. LHC 12/2013 showed normal coronaries. However, now with chest tightness and belching. Initial troponin minimally elevated, although may be secondary to demand ischemia in the setting of acute CHF. Continue to cycle enzymes x 3 to assess trend. Agree with Protonix and GI cocktail as GI etiology also possible.   3. Chronic Atrial Fibrillation: Ventricular rate is slow/controlled. Continue bisoprolol and Eliquis.   4. H/O AVR: s/p tissue valve at [facility]. Will reassess status by 2D echo.   Beau Fanny, PA-C 12/14/2014, 5:51 PM Agree with note written by Boyce Medici  PAC  Pt with H/O tissue AVR, CAF on Eliquis , and cath documented nl cors 1 year ago by Dr. Tresa Endo. Admitted now with volume overload CHF. She has had increasing SOB and orthopnea as well as several recent admissions for this. Her exam is notable for 2+ pitting edema as well as edema up to her abdomen. She has crackles at the right base and her CXR shows RLL infiltrate with interstitial edema. BNP moderate ly elevated and EKG shows AFIB with CVR. Agree with IV diuresis as you are doing as well as 2D echo. Will follow with you.  Runell Gess 12/14/2014 6:23 PM

## 2014-12-14 NOTE — ED Notes (Signed)
MD at bedside. 

## 2014-12-14 NOTE — ED Notes (Signed)
Pt here for worsening SOB over the past month. Speaking in short sentences. sts recently hospitalized for CHF.

## 2014-12-15 ENCOUNTER — Inpatient Hospital Stay (HOSPITAL_COMMUNITY): Payer: Medicare Other

## 2014-12-15 DIAGNOSIS — E669 Obesity, unspecified: Secondary | ICD-10-CM

## 2014-12-15 DIAGNOSIS — I10 Essential (primary) hypertension: Secondary | ICD-10-CM

## 2014-12-15 DIAGNOSIS — E119 Type 2 diabetes mellitus without complications: Secondary | ICD-10-CM

## 2014-12-15 LAB — GLUCOSE, CAPILLARY
Glucose-Capillary: 136 mg/dL — ABNORMAL HIGH (ref 70–99)
Glucose-Capillary: 110 mg/dL — ABNORMAL HIGH (ref 70–99)

## 2014-12-15 LAB — BASIC METABOLIC PANEL
ANION GAP: 10 (ref 5–15)
BUN: 31 mg/dL — ABNORMAL HIGH (ref 6–23)
CALCIUM: 8.9 mg/dL (ref 8.4–10.5)
CO2: 23 mmol/L (ref 19–32)
Chloride: 106 mmol/L (ref 96–112)
Creatinine, Ser: 1.33 mg/dL — ABNORMAL HIGH (ref 0.50–1.10)
GFR calc Af Amer: 43 mL/min — ABNORMAL LOW (ref >90)
GFR calc non Af Amer: 37 mL/min — ABNORMAL LOW (ref >90)
Glucose, Bld: 83 mg/dL (ref 70–99)
Potassium: 4.4 mmol/L (ref 3.5–5.1)
SODIUM: 139 mmol/L (ref 135–145)

## 2014-12-15 LAB — TROPONIN I: TROPONIN I: 0.04 ng/mL — AB (ref <0.031)

## 2014-12-15 MED ORDER — PANTOPRAZOLE SODIUM 40 MG PO TBEC
40.0000 mg | DELAYED_RELEASE_TABLET | Freq: Every day | ORAL | Status: DC
  Administered 2014-12-15 – 2014-12-29 (×15): 40 mg via ORAL
  Filled 2014-12-15 (×16): qty 1

## 2014-12-15 MED ORDER — INSULIN ASPART 100 UNIT/ML ~~LOC~~ SOLN
0.0000 [IU] | Freq: Three times a day (TID) | SUBCUTANEOUS | Status: DC
  Administered 2014-12-15: 1 [IU] via SUBCUTANEOUS

## 2014-12-15 NOTE — Consult Note (Signed)
WOC wound consult note Reason for Consult: LE wounds.  Pt with CHF and volume overload and LE edema. She reports chronic LE edema and weeping.  Her family reports at the bedside today that they wrap her legs in gauze and ACE wraps from time to time to manage the weeping.  Currently she does not appear to be actively weeping. She does have some open areas on the left pretibial area that are yellow but I do not suspect infected. She has some scattered areas over the right pretibial area that are closed and crusted.  Her caregiver also reports that ulcerations on her legs are frequent.  Wound type: weeping, ulcers related to edema  Pressure Ulcer POA: No Wound bed: see above Drainage (amount, consistency, odor) minimal, however her legs are elevated at the time of my assessment.  Periwound: intact Dressing procedure/placement/frequency: This patient would benefit from compression therapy to manage her LE edema, however she has not had therapeutic compression in the past.   She would benefit from long term compression hose, however she would need to have some compression initially  from wraps to less her acute edema then she could be fit by a DME provider as an outpatient for hose.   Would suggest ABI, just to verify arterial status if Unna's boots or 4 layer compression are desired by the medical or cardiologist.  Silicone foam to be used over open areas to manage exudate.  Notify WOC nurse if compression is desired and we will work with you for placement.   Arelis Neumeier [place] RN,CWOCN [phone]

## 2014-12-15 NOTE — Progress Notes (Signed)
*  PRELIMINARY RESULTS* Echocardiogram 2D Echocardiogram has been performed.  Melanie Camacho 12/15/2014, 9:37 AM

## 2014-12-15 NOTE — Progress Notes (Signed)
Note left for MD and dayshift RN:  Patient daughter Lynnda Shields request addition of stool softener to medication regimen to prevent constipation. States that patient frequently gets bowel obstructions.

## 2014-12-15 NOTE — Progress Notes (Signed)
Subjective: SOB  Objective: Vital signs in last 24 hours: Temp:  [97.4 F (36.3 C)-98.5 F (36.9 C)] 98.5 F (36.9 C) (04/07 0420) Pulse Rate:  [25-89] 88 (04/07 0710) Resp:  [15-30] 20 (04/07 0710) BP: (77-119)/(45-76) 113/69 mmHg (04/07 0710) SpO2:  [86 %-100 %] 94 % (04/07 0710) Weight:  [231 lb (104.781 kg)-232 lb (105.235 kg)] 231 lb 6.4 oz (104.962 kg) (04/07 0536) Last BM Date: 12/14/14  Intake/Output from previous day: 04/06 0701 - 04/07 0700 In: 460 [P.O.:460] Out: 2075 [Urine:2075] Intake/Output this shift:    Medications Current Facility-Administered Medications  Medication Dose Route Frequency Provider Last Rate Last Dose  . 0.9 %  sodium chloride infusion  250 mL Intravenous PRN Stephani Police, PA-C      . acetaminophen (TYLENOL) tablet 650 mg  650 mg Oral Q4H PRN Stephani Police, PA-C      . apixaban (ELIQUIS) tablet 5 mg  5 mg Oral BID Stephani Police, PA-C   5 mg at 12/14/14 2138  . bisoprolol (ZEBETA) tablet 5 mg  5 mg Oral Daily Stephani Police, PA-C   5 mg at 12/14/14 2133  . colesevelam [facility]) tablet 1,875 mg  1,875 mg Oral BID WC Marianne L York, PA-C      . furosemide (LASIX) injection 60 mg  60 mg Intravenous BID Stephani Police, PA-C   60 mg at 12/15/14 1191  . gabapentin (NEURONTIN) capsule 300 mg  300 mg Oral BID Stephani Police, PA-C   300 mg at 12/14/14 2138  . gi cocktail (Maalox,Lidocaine,Donnatal)  30 mL Oral BID PRN Stephani Police, PA-C   30 mL at 12/14/14 1640  . multivitamin with minerals tablet 1 tablet  1 tablet Oral Daily Stephani Police, PA-C      . multivitamin-lutein (OCUVITE-LUTEIN) capsule 2 capsule  2 capsule Oral Daily Stephani Police, PA-C      . nystatin cream (MYCOSTATIN)   Topical BID Tora Kindred York, PA-C      . ondansetron (ZOFRAN) injection 4 mg  4 mg Intravenous Q6H PRN Stephani Police, PA-C      . oxycodone (OXY-IR) immediate release capsule 5 mg  5 mg Oral Q6H PRN Stephani Police, PA-C      . pantoprazole  (PROTONIX) EC tablet 40 mg  40 mg Oral QHS Rodolph Bong, MD      . rOPINIRole (REQUIP) tablet 0.25 mg  0.25 mg Oral TID PRN Laurey Morale, MD   0.25 mg at 12/14/14 2351  . sodium chloride 0.9 % injection 3 mL  3 mL Intravenous Q12H Stephani Police, PA-C   3 mL at 12/14/14 2139  . sodium chloride 0.9 % injection 3 mL  3 mL Intravenous PRN Stephani Police, PA-C        PE: General appearance: alert, cooperative and no distress Neck: +JVD Lungs: +Rales Heart: irregularly irregular rhythm and -MM Extremities: 2+ tense LEE extends all the way up the back of her legs.  Pulses: 2+ and symmetric Skin: Warm and dry Neurologic: Grossly normal  Lab Results:   Recent Labs  12/14/14 1410  WBC 4.2  HGB 10.9*  HCT 35.7*  PLT 201   BMET  Recent Labs  12/14/14 1410 12/15/14 0414  NA 139 139  K 5.9* 4.4  CL 107 106  CO2 24 23  GLUCOSE 82 83  BUN 33* 31*  CREATININE 1.33* 1.33*  CALCIUM 9.1 8.9    Assessment/Plan  Principal Problem:   Acute on chronic diastolic heart failure Net fluids: -1.6L overnight.  SCr stable.  Slight improvement in CXR today.  She has a long way to go to get fluid off.  Continue IV lasix at current dose.    Chronic a-fib  Rate controlled. Eliquis.    S/P AVR-(tissue) 2011 at [facility] - no CABG with this   HTN- (transient hypotension during recent admission)  Some mild hypotension .  Continue to monitor   Hypothyroid   Obesity- BMI 45   DM (diabetes mellitus)   Left sided chest pain  Troponin mildly elevated(0.05) and trend flat.  Likely from CHF.     LOS: 1 day    HAGER, BRYAN PA-C 12/15/2014 8:11 AM  I have seen and examined the patient along with HAGER, BRYAN PA-C.  I have reviewed the chart, notes and new data.  I agree with PA's note.  Key new complaints: still dyspneic Key examination changes: 3+ edema both calves, erythematous Key new findings / data: stable, mildly elevated creatinine  PLAN: Anticipate need for several days of IV  diuretics. Echo pending.  Thurmon Fair, MD, [facility] [phone] 12/15/2014, 8:51 AM

## 2014-12-15 NOTE — Progress Notes (Signed)
[facility] PROGRESS NOTE  Melanie Camacho BWL:[id number] DOB: [birth year] DOA: 12/14/2014 PCP: PROVIDER NOT IN SYSTEM  Assessment/Plan: #1 acute on chronic diastolic CHF exacerbation Questionable etiology. Cardiac enzymes minimally elevated however seems to have plateaued. BNP was elevated on admission at 1222.9. 2-D echo with EF of 55-60% with no wall motion abnormalities. Aortic valve of the prosthesis was present with normal range of motion no evidence of dehiscence. I/O equal -1.9 L over the past 24 hours. Current weight is 104.96 kg from 105.23 kg on admission. Continue current dose of IV Lasix. Continue zebeta, welchol. Cardiology following and appreciate input and recommendations.  #2 atrial fibrillation Currently rate controlled on beta blocker. Continue eliquis for anticoagulation.  #3 status post AVR 2011 at [facility]. 2-D echo with prosthesis present with normal range of motion and no evidence of dehiscence. Per cardiology.  #4 diabetes mellitus diet-controlled Check a hemoglobin A1c. Sliding scale insulin.  #5 prophylaxis Protonix for GI prophylaxis. Eliquis for anticoagulation.   Code Status: Full Family Communication: Updated patient no family present. Disposition Plan: Remain inpatient.   Consultants:  Cardiology: Dr. Allyson Sabal 12/14/2014  Procedures:  Chest x-ray 12/15/2014, 12/14/2014  2-D echo 12/15/2014    Antibiotics:  None  HPI/Subjective: Patient with some complaints of shortness of breath.  Objective: Filed Vitals:   12/15/14 0831  BP:   Pulse:   Temp: 97.7 F (36.5 C)  Resp:     Intake/Output Summary (Last 24 hours) at 12/15/14 1647 Last data filed at 12/15/14 1000  Gross per 24 hour  Intake    820 ml  Output   2575 ml  Net  -1755 ml   Filed Weights   12/14/14 1335 12/14/14 1820 12/15/14 0536  Weight: 104.781 kg (231 lb) 105.235 kg (232 lb) 104.962 kg (231 lb 6.4 oz)    Exam:   General:  NAD  Cardiovascular: Irregularly  irregular, positive JVD.  Respiratory: Bibasilar crackles.  Abdomen: Soft, positive bowel sounds, nontender to palpation, lower abdominal wall edema.  Musculoskeletal: No clubbing cyanosis. 2-3+ bilateral lower extremity edema all the way to the hips.  Data Reviewed: Basic Metabolic Panel:  Recent Labs Lab 12/14/14 1410 12/15/14 0414  NA 139 139  K 5.9* 4.4  CL 107 106  CO2 24 23  GLUCOSE 82 83  BUN 33* 31*  CREATININE 1.33* 1.33*  CALCIUM 9.1 8.9   Liver Function Tests: No results for input(s): AST, ALT, ALKPHOS, BILITOT, PROT, ALBUMIN in the last 168 hours. No results for input(s): LIPASE, AMYLASE in the last 168 hours. No results for input(s): AMMONIA in the last 168 hours. CBC:  Recent Labs Lab 12/14/14 1410  WBC 4.2  HGB 10.9*  HCT 35.7*  MCV 101.7*  PLT 201   Cardiac Enzymes:  Recent Labs Lab 12/14/14 1616 12/14/14 2150 12/15/14 0414  TROPONINI 0.05* 0.04* 0.04*   BNP (last 3 results)  Recent Labs  12/14/14 1410  BNP 1222.9*    ProBNP (last 3 results) No results for input(s): PROBNP in the last 8760 hours.  CBG:  Recent Labs Lab 12/15/14 1640  GLUCAP 136*    Recent Results (from the past 240 hour(s))  MRSA PCR Screening     Status: None   Collection Time: 12/14/14  7:46 PM  Result Value Ref Range Status   MRSA by PCR NEGATIVE NEGATIVE Final    Comment:        The GeneXpert MRSA Assay (FDA approved for NASAL specimens only), is one component of a comprehensive  MRSA colonization surveillance program. It is not intended to diagnose MRSA infection nor to guide or monitor treatment for MRSA infections.      Studies: Dg Chest 2 View  12/15/2014   CLINICAL DATA:  Shortness of breath, history of aortic valve replacement an acute and chronic CHF  EXAM: CHEST  2 VIEW  COMPARISON:  Portable chest x-ray of December 14, 2014  FINDINGS: The left lung is well-expanded. There is a small left pleural effusion. On the right there is a  moderate-sized effusion with basilar atelectasis. The pulmonary interstitial markings remain mildly increased. The cardiac silhouette remains enlarged. There is tortuosity of the descending thoracic aorta.  IMPRESSION: COPD with superimposed CHF. Slight interval improvement in the appearance of the pulmonary interstitium has occurred since yesterday's study. There is persistent pleural fluid and basilar atelectasis on the right.   Electronically Signed   By: David  [place]   On: 12/15/2014 08:07   Dg Chest Port 1 View  12/14/2014   CLINICAL DATA:  Shortness of Breath  EXAM: PORTABLE CHEST - 1 VIEW  COMPARISON:  None.  FINDINGS: There is cardiomegaly with pulmonary venous hypertension. There is a right pleural effusion with airspace consolidation in the right base. There is mild interstitial edema. No adenopathy. Bones are osteoporotic. Patient is status post median sternotomy.  IMPRESSION: Evidence of congestive heart failure. Cannot exclude superimposed pneumonia in the right base.   Electronically Signed   By: Bretta Bang III M.D.   On: 12/14/2014 14:37    Scheduled Meds: . apixaban  5 mg Oral BID  . bisoprolol  5 mg Oral Daily  . colesevelam  1,875 mg Oral BID WC  . furosemide  60 mg Intravenous BID  . gabapentin  300 mg Oral BID  . insulin aspart  0-9 Units Subcutaneous TID WC  . multivitamin with minerals  1 tablet Oral Daily  . multivitamin-lutein  2 capsule Oral Daily  . nystatin cream   Topical BID  . pantoprazole  40 mg Oral QHS  . sodium chloride  3 mL Intravenous Q12H   Continuous Infusions:   Principal Problem:   Acute on chronic diastolic heart failure Active Problems:   Chronic a-fib   S/P AVR-(tissue) 2011 at [facility] - no CABG with this   HTN- (transient hypotension during recent admission)   Hypothyroid   Obesity- BMI 45   DM (diabetes mellitus)   Left sided chest pain    Time spent: 40 minutes    THOMPSON,DANIEL M.D. [facility] Pager [phone]. If  7PM-7AM, please contact night-coverage at [email or web address], password [email or web address] 12/15/2014, 4:47 PM  LOS: 1 day

## 2014-12-15 NOTE — Progress Notes (Signed)
I stopped by to discuss HF recommendations with Melanie Camacho.  She requests that I come back tomorrow-- as she just got in bed to rest.  I will return tomorrow to go over HF education.

## 2014-12-15 NOTE — Progress Notes (Signed)
Utilization review completed. Kellis Mcadam, RN, BSN. 

## 2014-12-15 NOTE — Evaluation (Signed)
Physical Therapy Evaluation Patient Details Name: Melanie Camacho MRN: [id number] DOB: 1932-02-11 Today's Date: 12/15/2014   History of Present Illness  pt is an 81 y/o female admitte with acute on chronic diastolic eart failure with progressive SOB and trunk and limb edema..  Pt has had recent falls and has a disproportion fear of falling.  Clinical Impression  Pt admitted with/for AOCHF with SOB and edema.  Of concern is patient extreme fear of falling that may impact her progress.  Pt currently limited functionally due to the problems listed. ( See problems list.)   Pt will benefit from PT to maximize function and safety in order to get ready for next venue listed below.     Follow Up Recommendations SNF    Equipment Recommendations  None recommended by PT    Recommendations for Other Services       Precautions / Restrictions Precautions Precautions: Fall      Mobility  Bed Mobility Overal bed mobility: Needs Assistance Bed Mobility: Supine to Sit     Supine to sit: Max assist     General bed mobility comments: suspect fear of falling kept pt from fully assisting to EOB  Transfers Overall transfer level: Needs assistance Equipment used: 1 person hand held assist Transfers: Sit to/from Stand Sit to Stand: Mod assist         General transfer comment: face to face assist to stand to scoot back on the bed.  Ambulation/Gait                Stairs            Wheelchair Mobility    Modified Rankin (Stroke Patients Only)       Balance Overall balance assessment: Needs assistance Sitting-balance support: No upper extremity supported Sitting balance-Leahy Scale: Fair Sitting balance - Comments: sitting EOB eating, does not move outside her BOS                                     Pertinent Vitals/Pain Pain Assessment: No/denies pain    Home Living Family/patient expects to be discharged to:: Private residence Living Arrangements:  Children Available Help at Discharge: Family;Available 24 hours/day Type of Home: House Home Access: Ramped entrance     Home Layout: One level Home Equipment: Walker - 2 wheels;Walker - 4 wheels;Bedside commode;Shower seat      Prior Function Level of Independence: Needs assistance   Gait / Transfers Assistance Needed: assist wit RW around home lately, but not 3 or more weeks ago.  ADL's / Homemaking Assistance Needed: no longer takes showers due to fears of falling, takes sponge baths        Hand Dominance        Extremity/Trunk Assessment   Upper Extremity Assessment: Defer to OT evaluation           Lower Extremity Assessment: Generalized weakness         Communication   Communication: No difficulties  Cognition Arousal/Alertness: Awake/alert Behavior During Therapy: Anxious;WFL for tasks assessed/performed Overall Cognitive Status: Within Functional Limits for tasks assessed                      General Comments      Exercises        Assessment/Plan    PT Assessment Patient needs continued PT services  PT Diagnosis Difficulty walking;Generalized weakness   PT Problem  List Decreased strength;Decreased activity tolerance;Decreased balance;Decreased mobility;Decreased knowledge of use of DME;Decreased safety awareness;Cardiopulmonary status limiting activity  PT Treatment Interventions Gait training;DME instruction;Functional mobility training;Therapeutic activities;Balance training;Patient/family education   PT Goals (Current goals can be found in the Care Plan section) Acute Rehab PT Goals Patient Stated Goal: go home PT Goal Formulation: With patient Time For Goal Achievement: 12/29/14 Potential to Achieve Goals: Fair    Frequency Min 3X/week   Barriers to discharge        Co-evaluation               End of Session   Activity Tolerance: Patient tolerated treatment well Patient left: with family/visitor present (sitting  EOB) Nurse Communication: Mobility status         Time: 1610-9604 PT Time Calculation (min) (ACUTE ONLY): 29 min   Charges:   PT Evaluation $Initial PT Evaluation Tier I: 1 Procedure PT Treatments $Therapeutic Activity: 8-22 mins   PT G Codes:        Santa Abdelrahman, Eliseo Gum 12/15/2014, 5:55 PM 12/15/2014  Clarkston Bing, PT [phone]  (pager)

## 2014-12-16 DIAGNOSIS — Z954 Presence of other heart-valve replacement: Secondary | ICD-10-CM

## 2014-12-16 DIAGNOSIS — E039 Hypothyroidism, unspecified: Secondary | ICD-10-CM

## 2014-12-16 LAB — HEMOGLOBIN A1C
HEMOGLOBIN A1C: 5.5 % (ref 4.8–5.6)
Mean Plasma Glucose: 111 mg/dL

## 2014-12-16 LAB — BASIC METABOLIC PANEL
Anion gap: 9 (ref 5–15)
BUN: 31 mg/dL — ABNORMAL HIGH (ref 6–23)
CALCIUM: 9 mg/dL (ref 8.4–10.5)
CO2: 26 mmol/L (ref 19–32)
CREATININE: 1.31 mg/dL — AB (ref 0.50–1.10)
Chloride: 104 mmol/L (ref 96–112)
GFR, EST AFRICAN AMERICAN: 43 mL/min — AB (ref >90)
GFR, EST NON AFRICAN AMERICAN: 37 mL/min — AB (ref >90)
Glucose, Bld: 89 mg/dL (ref 70–99)
Potassium: 3.6 mmol/L (ref 3.5–5.1)
SODIUM: 139 mmol/L (ref 135–145)

## 2014-12-16 LAB — GLUCOSE, CAPILLARY
GLUCOSE-CAPILLARY: 82 mg/dL (ref 70–99)
GLUCOSE-CAPILLARY: 96 mg/dL (ref 70–99)
Glucose-Capillary: 83 mg/dL (ref 70–99)
Glucose-Capillary: 99 mg/dL (ref 70–99)

## 2014-12-16 LAB — TSH: TSH: 7.734 u[IU]/mL — ABNORMAL HIGH (ref 0.350–4.500)

## 2014-12-16 LAB — T4, FREE: Free T4: 0.75 ng/dL — ABNORMAL LOW (ref 0.80–1.80)

## 2014-12-16 MED ORDER — POTASSIUM CHLORIDE CRYS ER 20 MEQ PO TBCR
40.0000 meq | EXTENDED_RELEASE_TABLET | Freq: Once | ORAL | Status: AC
  Administered 2014-12-16: 40 meq via ORAL
  Filled 2014-12-16: qty 2

## 2014-12-16 MED ORDER — FUROSEMIDE 10 MG/ML IJ SOLN
60.0000 mg | Freq: Two times a day (BID) | INTRAMUSCULAR | Status: DC
  Administered 2014-12-16 – 2014-12-22 (×12): 60 mg via INTRAVENOUS
  Filled 2014-12-16 (×12): qty 6

## 2014-12-16 MED ORDER — DOCUSATE SODIUM 100 MG PO CAPS
100.0000 mg | ORAL_CAPSULE | Freq: Two times a day (BID) | ORAL | Status: DC
Start: 2014-12-16 — End: 2014-12-30
  Administered 2014-12-17 – 2014-12-29 (×10): 100 mg via ORAL
  Filled 2014-12-16 (×19): qty 1

## 2014-12-16 MED ORDER — LEVOTHYROXINE SODIUM 25 MCG PO TABS
25.0000 ug | ORAL_TABLET | Freq: Every day | ORAL | Status: DC
  Administered 2014-12-17 – 2014-12-30 (×14): 25 ug via ORAL
  Filled 2014-12-16 (×15): qty 1

## 2014-12-16 MED ORDER — POTASSIUM CHLORIDE CRYS ER 20 MEQ PO TBCR
40.0000 meq | EXTENDED_RELEASE_TABLET | Freq: Every day | ORAL | Status: DC
  Administered 2014-12-17 – 2014-12-30 (×14): 40 meq via ORAL
  Filled 2014-12-16 (×14): qty 2

## 2014-12-16 NOTE — Evaluation (Signed)
Occupational Therapy Evaluation Patient Details Name: Melanie Camacho DOBRY MRN: [id number] DOB: 16-Sep-1931 Today's Date: 12/16/2014    History of Present Illness Pt is a 81 y.o. female with PMH of CAD, NSTEMI on 12/22/13, aortic valve replacement, DM, HTN, heart murmur, a-fib, CHF, s/p L heart cath with angiogram 12/2013 admitted 12/13/13 with SOB and found to be hypoxic in ED on RA with SPO2 of 86%.   Clinical Impression   PTA pt lived at home and was previously mobile with use of rollator, however decreased activity over the past 3 weeks. Pt is independent with ADLs however was sponge bathing due to difficulty accessing tub shower. Pt will benefit from ST Rehab at d/c for safety and strengthening and will benefit from acute OT to address functional mobility and ADLs.     Follow Up Recommendations  SNF;Supervision/Assistance - 24 hour    Equipment Recommendations  Tub/shower bench;3 in 1 bedside comode    Recommendations for Other Services       Precautions / Restrictions Precautions Precautions: Fall Precaution Comments: very fearful of falling Restrictions Weight Bearing Restrictions: No      Mobility Bed Mobility               General bed mobility comments: pt sitting on EOB when OT arrived.   Transfers Overall transfer level: Needs assistance Equipment used: Rolling walker (2 wheeled) Transfers: Sit to/from [employer] Sit to Stand: Min assist;+2 physical assistance Stand pivot transfers: Min assist;+2 physical assistance       General transfer comment: Pt stood from EOB with +2 assist and VC's for technique. Demo'd technique prior to transfer. Pt reported "feeling good" when standing. Pt sat to speak with pharmacist, then stood and pivoted to reclinre with +2 min a. Assist to facilitate weight shift for foot clearance.     Balance Overall balance assessment: Needs assistance Sitting-balance support: Feet supported;No upper extremity supported Sitting  balance-Leahy Scale: Fair Sitting balance - Comments: fair static sitting; does not move outside BOS for dynamic sitting   Standing balance support: Bilateral upper extremity supported;During functional activity Standing balance-Leahy Scale: Poor Standing balance comment: pt required UE support for static balance                            ADL Overall ADL's : Needs assistance/impaired Eating/Feeding: Independent;Sitting   Grooming: Set up;Sitting   Upper Body Bathing: Set up;Sitting   Lower Body Bathing: Moderate assistance;+2 for physical assistance;Sit to/from stand   Upper Body Dressing : Set up;Sitting   Lower Body Dressing: Maximal assistance;+2 for physical assistance;Sit to/from stand   Toilet Transfer: Minimal assistance;+2 for physical assistance;Stand-pivot;RW Toilet Transfer Details (indicate cue type and reason): bed>recliner           General ADL Comments: Pt fearful of falling but did well after developing rapport and discussing reason for technique of leaning forward to stand. Pt admits "trust issues" but did well with OT/OTAS     Vision Additional Comments: No change from baseline          Pertinent Vitals/Pain Pain Assessment: No/denies pain        Extremity/Trunk Assessment Upper Extremity Assessment Upper Extremity Assessment: Generalized weakness;RUE deficits/detail;LUE deficits/detail RUE Deficits / Details: edematous UEs; shoulder pain with reports of rotator cuff injury which is not surgical RUE: Unable to fully assess due to pain RUE Coordination: decreased gross motor LUE Deficits / Details: edematous UEs; shoulder pain with reports of rotator  cuff injury which is not surgical LUE: Unable to fully assess due to pain LUE Coordination: decreased gross motor   Lower Extremity Assessment Lower Extremity Assessment: Generalized weakness   Cervical / Trunk Assessment Cervical / Trunk Assessment: Kyphotic   Communication  Communication Communication: No difficulties   Cognition Arousal/Alertness: Awake/alert Behavior During Therapy: Anxious;WFL for tasks assessed/performed Overall Cognitive Status: Within Functional Limits for tasks assessed                     General Comments    Discussed benefits of ST Rehab extensively with pt and daughter present. Pt had questions about receiving HH therapies and educated pt on need for 24/7 physical assistance and benefit of intensive rehab therapy daily to promote return to PLOF. Daughter in agreement for rehab.             Home Living Family/patient expects to be discharged to:: Private residence Living Arrangements: Children Available Help at Discharge: Family;Available 24 hours/day Type of Home: House Home Access: Ramped entrance     Home Layout: One level           Bathroom Accessibility: No   Home Equipment: Toilet riser;Walker - 4 wheels;Walker - 2 wheels;Shower seat          Prior Functioning/Environment Level of Independence: Needs assistance  Gait / Transfers Assistance Needed: Pt was ambulating with RW short distances in house, however has had increasing difficulty with mobility over the past 3 weeks and has been minimally mobile  ADL's / Homemaking Assistance Needed: Pt takes sponge baths due to difficulty getting into bathtub.         OT Diagnosis: Generalized weakness;Other (comment) (decreased cardiopulmonary status)   OT Problem List: Decreased strength;Decreased range of motion;Decreased activity tolerance;Impaired balance (sitting and/or standing);Decreased safety awareness;Decreased knowledge of use of DME or AE;Decreased knowledge of precautions;Cardiopulmonary status limiting activity;Impaired UE functional use;Obesity;Increased edema   OT Treatment/Interventions: Self-care/ADL training;Therapeutic exercise;Energy conservation;DME and/or AE instruction;Therapeutic activities;Patient/family education;Balance training     OT Goals(Current goals can be found in the care plan section) Acute Rehab OT Goals Patient Stated Goal: to go home OT Goal Formulation: With patient/family Time For Goal Achievement: 12/30/14 Potential to Achieve Goals: Good ADL Goals Pt Will Perform Grooming: with min assist;standing Pt Will Transfer to Toilet: with min guard assist;stand pivot transfer;bedside commode Additional ADL Goal #1: Pt will perform bed mobility with supervision to prepare for ADLs.  Additional ADL Goal #2: Pt will Independently demonstrate pursed lip breathing technique when dyspneic during ADLs.   OT Frequency: Min 2X/week    End of Session Equipment Utilized During Treatment: Gait belt;Rolling walker  Activity Tolerance: Patient limited by fatigue;Other (comment) (limited by cardiopulmonary issues) Patient left: in chair;with call bell/phone within reach;with family/visitor present   Time: 1004-1040 OT Time Calculation (min): 36 min Charges:  OT General Charges $OT Visit: 1 Procedure OT Evaluation $Initial OT Evaluation Tier I: 1 Procedure OT Treatments $Therapeutic Activity: 8-22 mins G-Codes:    Rae Lips 12/24/2014, 11:15 AM  Carney Living, OTR/L Occupational Therapist [phone] (pager)

## 2014-12-16 NOTE — Progress Notes (Signed)
[facility] PROGRESS NOTE  MIMA EBBS HQI:[id number] DOB: February 21, 1932 DOA: 12/14/2014 PCP: PROVIDER NOT IN SYSTEM  Assessment/Plan: #1 acute on chronic diastolic CHF exacerbation Questionable etiology. Cardiac enzymes minimally elevated however seems to have plateaued. BNP was elevated on admission at 1222.9. 2-D echo with EF of 55-60% with no wall motion abnormalities. Aortic valve of the prosthesis was present with normal range of motion no evidence of dehiscence. I/O equal -1.9 L over the past 24 hours. Current weight is 104.6 kg from 104.96 kg from 105.23 kg on admission. Continue current dose of IV Lasix. Continue zebeta, welchol. Cardiology following and appreciate input and recommendations.  #2 atrial fibrillation Currently rate controlled on beta blocker. Continue eliquis for anticoagulation.  #3 status post AVR 2011 at [facility]. 2-D echo with prosthesis present with normal range of motion and no evidence of dehiscence. Per cardiology.  #4 well-controlled diabetes mellitus diet-controlled Hemoglobin A1c is 5.5. CBGs have ranged from 82-136. Sliding scale insulin.  #5 hypothyroidism Patient with prior history of hypothyroidism however per family patient has been off thyroid medication for approximately 3 years per PCP. Patient noted to be volume overloaded with complaints of feeling cold all the time and lethargic. TSH obtained is elevated at 7.734. Free T4 is decreased at 0.75. Will start patient on Synthroid 25 g daily. Patient need repeat thyroid function studies done in about 4-6 weeks as outpatient.  #6 prophylaxis Protonix for GI prophylaxis. Eliquis for anticoagulation.   Code Status: Full Family Communication: Updated patient no family present. Disposition Plan: Remain inpatient.   Consultants:  Cardiology: Dr. Allyson Sabal 12/14/2014  Procedures:  Chest x-ray 12/15/2014, 12/14/2014  2-D echo 12/15/2014    Antibiotics:  None  HPI/Subjective: Patient  with some complaints of shortness of breath. Patient states she's feeling better.  Objective: Filed Vitals:   12/16/14 1403  BP: 93/53  Pulse: 84  Temp: 98.3 F (36.8 C)  Resp: 20    Intake/Output Summary (Last 24 hours) at 12/16/14 1627 Last data filed at 12/16/14 0648  Gross per 24 hour  Intake    120 ml  Output   1900 ml  Net  -1780 ml   Filed Weights   12/14/14 1820 12/15/14 0536 12/16/14 0500  Weight: 105.235 kg (232 lb) 104.962 kg (231 lb 6.4 oz) 104.6 kg (230 lb 9.6 oz)    Exam:   General:  NAD  Cardiovascular: Irregularly irregular, positive JVD.  Respiratory: Bibasilar crackles.  Abdomen: Soft, positive bowel sounds, nontender to palpation, lower abdominal wall edema.  Musculoskeletal: No clubbing cyanosis. 2-3+ bilateral lower extremity edema all the way to the hips.  Data Reviewed: Basic Metabolic Panel:  Recent Labs Lab 12/14/14 1410 12/15/14 0414 12/16/14 0400  NA 139 139 139  K 5.9* 4.4 3.6  CL 107 106 104  CO2 24 23 26   GLUCOSE 82 83 89  BUN 33* 31* 31*  CREATININE 1.33* 1.33* 1.31*  CALCIUM 9.1 8.9 9.0   Liver Function Tests: No results for input(s): AST, ALT, ALKPHOS, BILITOT, PROT, ALBUMIN in the last 168 hours. No results for input(s): LIPASE, AMYLASE in the last 168 hours. No results for input(s): AMMONIA in the last 168 hours. CBC:  Recent Labs Lab 12/14/14 1410  WBC 4.2  HGB 10.9*  HCT 35.7*  MCV 101.7*  PLT 201   Cardiac Enzymes:  Recent Labs Lab 12/14/14 1616 12/14/14 2150 12/15/14 0414  TROPONINI 0.05* 0.04* 0.04*   BNP (last 3 results)  Recent Labs  12/14/14 1410  BNP 1222.9*    ProBNP (last 3 results) No results for input(s): PROBNP in the last 8760 hours.  CBG:  Recent Labs Lab 12/15/14 1640 12/15/14 2218 12/16/14 0741 12/16/14 1142  GLUCAP 136* 110* 82 83    Recent Results (from the past 240 hour(s))  MRSA PCR Screening     Status: None   Collection Time: 12/14/14  7:46 PM  Result Value  Ref Range Status   MRSA by PCR NEGATIVE NEGATIVE Final    Comment:        The GeneXpert MRSA Assay (FDA approved for NASAL specimens only), is one component of a comprehensive MRSA colonization surveillance program. It is not intended to diagnose MRSA infection nor to guide or monitor treatment for MRSA infections.      Studies: Dg Chest 2 View  12/15/2014   CLINICAL DATA:  Shortness of breath, history of aortic valve replacement an acute and chronic CHF  EXAM: CHEST  2 VIEW  COMPARISON:  Portable chest x-ray of December 14, 2014  FINDINGS: The left lung is well-expanded. There is a small left pleural effusion. On the right there is a moderate-sized effusion with basilar atelectasis. The pulmonary interstitial markings remain mildly increased. The cardiac silhouette remains enlarged. There is tortuosity of the descending thoracic aorta.  IMPRESSION: COPD with superimposed CHF. Slight interval improvement in the appearance of the pulmonary interstitium has occurred since yesterday's study. There is persistent pleural fluid and basilar atelectasis on the right.   Electronically Signed   By: David  [place]   On: 12/15/2014 08:07    Scheduled Meds: . apixaban  5 mg Oral BID  . bisoprolol  5 mg Oral Daily  . colesevelam  1,875 mg Oral BID WC  . docusate sodium  100 mg Oral BID  . furosemide  60 mg Intravenous BID  . gabapentin  300 mg Oral BID  . insulin aspart  0-9 Units Subcutaneous TID WC  . multivitamin with minerals  1 tablet Oral Daily  . multivitamin-lutein  2 capsule Oral Daily  . nystatin cream   Topical BID  . pantoprazole  40 mg Oral QHS  . sodium chloride  3 mL Intravenous Q12H   Continuous Infusions:   Principal Problem:   Acute on chronic diastolic heart failure Active Problems:   Chronic a-fib   S/P AVR-(tissue) 2011 at [facility] - no CABG with this   HTN- (transient hypotension during recent admission)   Hypothyroid   Obesity- BMI 45   DM (diabetes mellitus)   Left sided  chest pain    Time spent: 40 minutes    THOMPSON,DANIEL M.D. [facility] Pager [phone]. If 7PM-7AM, please contact night-coverage at [email or web address], password [facility] 12/16/2014, 4:27 PM  LOS: 2 days

## 2014-12-16 NOTE — Care Management Note (Signed)
    Page 1 of 2   12/30/2014     3:54:54 PM CARE MANAGEMENT NOTE 12/30/2014  Patient:  MALAKA, RIZVI   Account Number:  [id number]  Date Initiated:  12/16/2014  Documentation initiated by:  GRAVES-BIGELOW,Amayra Kiedrowski  Subjective/Objective Assessment:   Pt admitted for SOB.     Action/Plan:   CM to monitor for disposition needs.   Anticipated DC Date:  12/19/2014   Anticipated DC Plan:  SKILLED NURSING FACILITY      DC Planning Services  CM consult      Choice offered to / List presented to:             Status of service:  Completed, signed off Medicare Important Message given?  YES (If response is "NO", the following Medicare IM given date fields will be blank) Date Medicare IM given:  12/16/2014 Medicare IM given by:  GRAVES-BIGELOW,Catarina Huntley Date Additional Medicare IM given:  12/19/2014 Additional Medicare IM given by:  Shireen Rayburn GRAVES-BIGELOW  Discharge Disposition:  SKILLED NURSING FACILITY  Per UR Regulation:  Reviewed for med. necessity/level of care/duration of stay  If discussed at Long Length of Stay Meetings, dates discussed:   12/20/2014  12/22/2014  12/27/2014  12/29/2014    Comments:  12-30-14 1553 Medicare Important Message given? YES (If response is "NO", the following Medicare IM given date fields will be blank) Date Medicare IM given: Medicare IM given by: Barron Schmid, RN, BSN [phone]   12/28/14 Raynald Blend, RN, BSN, [phone]  Plan for SNF when medically stable, will follow up on BMP and creatine tomorrow.   12/27/14  Raynald Blend, RN, BSN [phone].  Social Worker informed CM that pt would discharge to [facility] in [place].  No CM needs at this time.  Medicare Important Message given? YES (If response is "NO", the following Medicare IM given date fields will be blank) Date Medicare IM given: Medicare IM given by: Raynald Blend , RN, BSN [phone]    Medicare Important Message given? YES (If response  is "NO", the following Medicare IM given date fields will be blank) Date Medicare IM given: Medicare IM given by: Tomi Bamberger [phone] 12-23-14 Meily Glowacki graves-Bigelow, RN,BSN [phone]     12-22-14 [address], RN,BSN [phone] Pt continues with IV Lasix for diuresing. Plan for SNF once stable. No further needs from CM at this time.   12-19-14 [address], [place] [phone] Per MD notes:   Acute on chronic diastolic heart failure continues on Lasix 60mg  IV BID. Per PT recommendations plan for SNF. CSW to assist with disposition needs.

## 2014-12-16 NOTE — Progress Notes (Signed)
Heart Failure Navigator Consult Note  Presentation: Melanie Camacho is a 81 y.o. female, with a past medical history of A. Fib, coronary artery disease with NSTEMI on 12/22/2013, aortic valve replacement (porcine tissue), diet controlled diabetes, hypertension, and hypothyroidism. She was recently hospitalized in [facility] on March 22-03/25/2015 for acute on chronic heart failure. Notes from that hospitalization indicate that she had gained 30 pounds and was subsequently started on metolazone. Her LVEF in December 2015 was 55-60%. Melanie Camacho reports that since her discharge she has become progressively more short of breath. She has developed a non productive cough. For the last 2 nights she has been unable to sleep and is unable to lie flat. This morning she was unable to walk across the floor to the bathroom due to dyspnea on exertion and her daughter insisted that she come to the emergency department. In the ER she was found to be hypoxic on room air with an oxygen saturation of 86%. She developed a tightness in the area of her left breast which feels better after burping. She has intermittent nausea. Her BNP is elevated at 1222, and her potassium is 5.9. Chest x-ray shows evidence of congestive heart failure and cannot exclude pneumonia in the right base. Her daughter,Melanie Camacho is an Systems analyst here at [employer] [facility] [employer] [employer] and she requests a consultation with the congestive heart failure team.  Past Medical History  Diagnosis Date  . Coronary artery disease   . Anginal pain   . Hypertension   . Heart murmur   . CHF (congestive heart failure)   . Shortness of breath     History   Social History  . Marital Status: Widowed    Spouse Name: N/A  . Number of Children: N/A  . Years of Education: N/A   Social History Main Topics  . Smoking status: Never Smoker   . Smokeless tobacco: Never Used  . Alcohol Use: No  . Drug Use: No  . Sexual Activity: No   Other Topics Concern  . None   Social  History Narrative    ECHO:Study Conclusions--12/15/14  - Left ventricle: The cavity size was normal. Wall thickness was normal. Systolic function was normal. The estimated ejection fraction was in the range of 55% to 60%. Wall motion was normal; there were no regional wall motion abnormalities. - Aortic valve: A prosthesis was present. The prosthesis had a normal range of motion. The sewing ring appeared normal, had no rocking motion, and showed no evidence of dehiscence. There was mild stenosis. There was mild regurgitation. Peak velocity (S): 240 cm/s. - Mitral valve: Moderately calcified annulus. Mildly thickened, mildly calcified leaflets . There was mild regurgitation. - Left atrium: The atrium was moderately dilated. - Right ventricle: The cavity size was mildly dilated. Wall thickness was normal. - Right atrium: The atrium was mildly dilated. - Pulmonary arteries: Systolic pressure was mildly increased. PA peak pressure: 31 mm Hg (S). - Pericardium, extracardiac: A trivial pericardial effusion was identified posterior to the heart.  Transthoracic echocardiography. M-mode, complete 2D, spectral Doppler, and color Doppler. Birthdate: Patient birthdate: 02-Nov-1931. Age: Patient is 81 yr old. Sex: Gender: female. BMI: 43.6 kg/m^2. Blood pressure:   113/69 Patient status: Inpatient. Study date: Study date: 12/15/2014. Study time: 08:59 AM. Location: Bedside  BNP    Component Value Date/Time   BNP 1222.9* 12/14/2014 1410    ProBNP No results found for: PROBNP   Education Assessment and Provision:  Detailed education and instructions provided on heart failure  disease management including the following:  Signs and symptoms of Heart Failure When to call the physician Importance of daily weights Low sodium diet Fluid restriction Medication management Anticipated future follow-up appointments  Patient education given on each of the  above topics.  Patient acknowledges understanding and acceptance of all instructions.  I spoke at length with patient regarding her HF.  She lives with her oldest daughterTeodora Camacho in [place].  Her other daughter Melanie Camacho works here at [employer] in the [place].  She admits that she does not weigh daily and I have explained the importance.  She says that her scale is currently not working --but that she will have it replaced.  She also admits that her daughter's family likes to eat out and that as a consequence she does as well.  We discussed how difficult it is to control sodium intake in restaurants.  She does say that her physician told her that she "could add salt while cooking" to foods just at no other time?  I explained a low sodium diet and high sodium foods to avoid.  She denies any issues with getting and taking medications as prescribed.    Education Materials:  "Living Better With Heart Failure" Booklet, Daily Weight Tracker Tool  High Risk Criteria for Readmission and/or Poor Patient Outcomes:   EF <30%- No 55-60%  2 or more admissions in 6 months- No  Difficult social situation-  No  Demonstrates medication noncompliance- No   Barriers of Care:  Knowledge and compliance  Discharge Planning:   Plans to discharge to home with daughter

## 2014-12-16 NOTE — Discharge Instructions (Addendum)
Follow with Primary MD  in 7 days   Get CBC, CMP, 2 view Chest X ray checked  by  SNF MD next visit.    Activity: As tolerated with Full fall precautions use walker/cane & assistance as needed   Disposition SNF   Diet: Heart Healthy Low Carb  - Check your Weight same time everyday, if you gain over 2 pounds, or you develop in leg swelling, experience more shortness of breath or chest pain, call your Primary MD immediately. Follow Cardiac Low Salt Diet and 1.5 lit/day fluid restriction.   On your next visit with your primary care physician please Get Medicines reviewed and adjusted.   Please request your Prim.MD to go over all Hospital Tests and Procedure/Radiological results at the follow up, please get all Hospital records sent to your Prim MD by signing hospital release before you go home.   If you experience worsening of your admission symptoms, develop shortness of breath, life threatening emergency, suicidal or homicidal thoughts you must seek medical attention immediately by calling 911 or calling your MD immediately  if symptoms less severe.  You Must read complete instructions/literature along with all the possible adverse reactions/side effects for all the Medicines you take and that have been prescribed to you. Take any new Medicines after you have completely understood and accpet all the possible adverse reactions/side effects.   Do not drive, operating heavy machinery, perform activities at heights, swimming or participation in water activities or provide baby sitting services if your were admitted for syncope or siezures until you have seen by Primary MD or a Neurologist and advised to do so again.  Do not drive when taking Pain medications.    Do not take more than prescribed Pain, Sleep and Anxiety Medications  Special Instructions: If you have smoked or chewed Tobacco  in the last 2 yrs please stop smoking, stop any regular Alcohol  and or any Recreational drug  use.  Wear Seat belts while driving.   Please note  You were cared for by a hospitalist during your hospital stay. If you have any questions about your discharge medications or the care you received while you were in the hospital after you are discharged, you can call the unit and asked to speak with the hospitalist on call if the hospitalist that took care of you is not available. Once you are discharged, your primary care physician will handle any further medical issues. Please note that NO REFILLS for any discharge medications will be authorized once you are discharged, as it is imperative that you return to your primary care physician (or establish a relationship with a primary care physician if you do not have one) for your aftercare needs so that they can reassess your need for medications and monitor your lab values.        Information on my medicine - ELIQUIS (apixaban)  This medication education was reviewed with me or my healthcare representative as part of my discharge preparation.  The pharmacist that spoke with me during my hospital stay was:  Pasty Spillers, [facility]  Why was Eliquis prescribed for you? Eliquis was prescribed for you to reduce the risk of a blood clot forming that can cause a stroke if you have a medical condition called atrial fibrillation (a type of irregular heartbeat).  What do You need to know about Eliquis ? Take your Eliquis TWICE DAILY - one tablet in the morning and one tablet in the evening with or without food.  If you have difficulty swallowing the tablet whole please discuss with your pharmacist how to take the medication safely.  Take Eliquis exactly as prescribed by your doctor and DO NOT stop taking Eliquis without talking to the doctor who prescribed the medication.  Stopping may increase your risk of developing a stroke.  Refill your prescription before you run out.  After discharge, you should have regular check-up  appointments with your healthcare provider that is prescribing your Eliquis.  In the future your dose may need to be changed if your kidney function or weight changes by a significant amount or as you get older.  What do you do if you miss a dose? If you miss a dose, take it as soon as you remember on the same day and resume taking twice daily.  Do not take more than one dose of ELIQUIS at the same time to make up a missed dose.  Important Safety Information A possible side effect of Eliquis is bleeding. You should call your healthcare provider right away if you experience any of the following: ? Bleeding from an injury or your nose that does not stop. ? Unusual colored urine (red or dark brown) or unusual colored stools (red or black). ? Unusual bruising for unknown reasons. ? A serious fall or if you hit your head (even if there is no bleeding).  Some medicines may interact with Eliquis and might increase your risk of bleeding or clotting while on Eliquis. To help avoid this, consult your healthcare provider or pharmacist prior to using any new prescription or non-prescription medications, including herbals, vitamins, non-steroidal anti-inflammatory drugs (NSAIDs) and supplements.  This website has more information on Eliquis (apixaban): http://www.eliquis.com/eliquis/home

## 2014-12-16 NOTE — Progress Notes (Signed)
Subjective:   Objective: Vital signs in last 24 hours: Temp:  [97.9 F (36.6 C)-98.7 F (37.1 C)] 97.9 F (36.6 C) (04/08 0500) Pulse Rate:  [43-105] 70 (04/08 0500) Resp:  [14-26] 20 (04/08 0500) BP: (84-114)/(37-78) 103/54 mmHg (04/08 0500) SpO2:  [93 %-100 %] 93 % (04/08 0014) Weight:  [230 lb 9.6 oz (104.6 kg)] 230 lb 9.6 oz (104.6 kg) (04/08 0500) Last BM Date: 12/14/14  Intake/Output from previous day: 04/07 0701 - 04/08 0700 In: 480 [P.O.:480] Out: 2550 [Urine:2550] Intake/Output this shift:    Medications Current Facility-Administered Medications  Medication Dose Route Frequency Provider Last Rate Last Dose  . 0.9 %  sodium chloride infusion  250 mL Intravenous PRN Stephani Police, PA-C      . acetaminophen (TYLENOL) tablet 650 mg  650 mg Oral Q4H PRN Stephani Police, PA-C      . apixaban (ELIQUIS) tablet 5 mg  5 mg Oral BID Stephani Police, PA-C   5 mg at 12/16/14 0834  . bisoprolol (ZEBETA) tablet 5 mg  5 mg Oral Daily Stephani Police, PA-C   5 mg at 12/16/14 0835  . colesevelam [facility]) tablet 1,875 mg  1,875 mg Oral BID WC Stephani Police, PA-C   1,875 mg at 12/16/14 0834  . docusate sodium (COLACE) capsule 100 mg  100 mg Oral BID Rodolph Bong, MD      . furosemide (LASIX) injection 60 mg  60 mg Intravenous BID Stephani Police, PA-C   60 mg at 12/15/14 1734  . gabapentin (NEURONTIN) capsule 300 mg  300 mg Oral BID Stephani Police, PA-C   300 mg at 12/15/14 2228  . gi cocktail (Maalox,Lidocaine,Donnatal)  30 mL Oral BID PRN Stephani Police, PA-C   30 mL at 12/14/14 1640  . insulin aspart (novoLOG) injection 0-9 Units  0-9 Units Subcutaneous TID WC Rodolph Bong, MD   1 Units at 12/15/14 1735  . multivitamin with minerals tablet 1 tablet  1 tablet Oral Daily Stephani Police, PA-C   1 tablet at 12/16/14 0834  . multivitamin-lutein (OCUVITE-LUTEIN) capsule 2 capsule  2 capsule Oral Daily Stephani Police, PA-C   2 capsule at 12/15/14 1000  . nystatin cream  (MYCOSTATIN)   Topical BID Stephani Police, PA-C      . ondansetron [facility]) injection 4 mg  4 mg Intravenous Q6H PRN Stephani Police, PA-C      . oxycodone (OXY-IR) immediate release capsule 5 mg  5 mg Oral Q6H PRN Stephani Police, PA-C      . pantoprazole (PROTONIX) EC tablet 40 mg  40 mg Oral QHS Rodolph Bong, MD   40 mg at 12/15/14 2228  . rOPINIRole (REQUIP) tablet 0.25 mg  0.25 mg Oral TID PRN Laurey Morale, MD   0.25 mg at 12/14/14 2351  . sodium chloride 0.9 % injection 3 mL  3 mL Intravenous Q12H Stephani Police, PA-C   3 mL at 12/15/14 2229  . sodium chloride 0.9 % injection 3 mL  3 mL Intravenous PRN Stephani Police, PA-C        PE:  General: Alert, oriented x3, no distress Head: no evidence of trauma, PERRL, EOMI, no exophtalmos or lid lag, no myxedema, no xanthelasma; normal ears, nose and oropharynx Neck: 8-9 cm jugular venous pulsations and no additional hepatojugular reflux; brisk carotid pulses without delay and no carotid bruits Chest: clear to auscultation, no signs of  consolidation by percussion or palpation, normal fremitus, symmetrical and full respiratory excursions Cardiovascular: normal position and quality of the apical impulse, regular rhythm, normal first and second heart sounds, no murmurs, rubs or gallops Abdomen: no tenderness or distention, no masses by palpation, no abnormal pulsatility or arterial bruits, normal bowel sounds, no hepatosplenomegaly Extremities: no clubbing, cyanosis; 3+ edema to the thighs bilaterally, starting to show some wrinkles; some erythema, no clear cellulitis; 2+ radial, ulnar and brachial pulses bilaterally; 2+ right femoral, posterior tibial and dorsalis pedis pulses; 2+ left femoral, posterior tibial and dorsalis pedis pulses; no subclavian or femoral bruits Neurological: grossly nonfocal    Lab Results:   Recent Labs  12/14/14 1410  WBC 4.2  HGB 10.9*  HCT 35.7*  PLT 201   BMET  Recent Labs  12/14/14 1410  12/15/14 0414 12/16/14 0400  NA 139 139 139  K 5.9* 4.4 3.6  CL 107 106 104  CO2 24 23 26   GLUCOSE 82 83 89  BUN 33* 31* 31*  CREATININE 1.33* 1.33* 1.31*  CALCIUM 9.1 8.9 9.0    Assessment/Plan  Acute on chronic diastolic heart failure Net fluids: -2.1L/-3.7. SCr stable. Slight improvement in CXR today. She has a long way to go to get fluid off. Continue IV lasix at current dose.   Chronic a-fib Rate controlled. Eliquis.   S/P AVR-(tissue) 2011 at [facility] - no CABG with this  HTN- (transient hypotension during recent admission) Some mild hypotension . Continue to monitor  Hypothyroid  Obesity- BMI 45  DM (diabetes mellitus)  Left sided chest pain Troponin mildly elevated(0.05) and trend flat. Likely from CHF.       LOS: 2 days    HAGER, BRYAN PA-C 12/16/2014 9:35 AM  I have seen and examined the patient along with HAGER, BRYAN PA-C.  I have reviewed the chart, notes and new data.  I agree with PA/NP's note.  Key new complaints: breathing better, off O2 Key examination changes: improving edema, still severe Key new findings / data: stable renal function  PLAN: Continue diuretics IV. Very uncertain regarding what her "dry weight" might be, but probably at least 3-4 L to go.  Thurmon Fair, MD, [facility] [phone] 12/16/2014, 9:43 AM

## 2014-12-17 ENCOUNTER — Encounter (HOSPITAL_COMMUNITY): Payer: Self-pay | Admitting: General Practice

## 2014-12-17 DIAGNOSIS — E119 Type 2 diabetes mellitus without complications: Secondary | ICD-10-CM | POA: Insufficient documentation

## 2014-12-17 LAB — T3: T3 TOTAL: 66 ng/dL — AB (ref 71–180)

## 2014-12-17 LAB — CBC
HCT: 33.4 % — ABNORMAL LOW (ref 36.0–46.0)
Hemoglobin: 10.4 g/dL — ABNORMAL LOW (ref 12.0–15.0)
MCH: 31.6 pg (ref 26.0–34.0)
MCHC: 31.1 g/dL (ref 30.0–36.0)
MCV: 101.5 fL — AB (ref 78.0–100.0)
Platelets: 161 10*3/uL (ref 150–400)
RBC: 3.29 MIL/uL — ABNORMAL LOW (ref 3.87–5.11)
RDW: 16.6 % — ABNORMAL HIGH (ref 11.5–15.5)
WBC: 4.9 10*3/uL (ref 4.0–10.5)

## 2014-12-17 LAB — GLUCOSE, CAPILLARY
GLUCOSE-CAPILLARY: 103 mg/dL — AB (ref 70–99)
Glucose-Capillary: 110 mg/dL — ABNORMAL HIGH (ref 70–99)
Glucose-Capillary: 105 mg/dL — ABNORMAL HIGH (ref 70–99)
Glucose-Capillary: 125 mg/dL — ABNORMAL HIGH (ref 70–99)

## 2014-12-17 LAB — BASIC METABOLIC PANEL
Anion gap: 4 — ABNORMAL LOW (ref 5–15)
BUN: 32 mg/dL — ABNORMAL HIGH (ref 6–23)
CALCIUM: 8.6 mg/dL (ref 8.4–10.5)
CO2: 29 mmol/L (ref 19–32)
Chloride: 106 mmol/L (ref 96–112)
Creatinine, Ser: 1.21 mg/dL — ABNORMAL HIGH (ref 0.50–1.10)
GFR calc Af Amer: 48 mL/min — ABNORMAL LOW (ref >90)
GFR calc non Af Amer: 41 mL/min — ABNORMAL LOW (ref >90)
GLUCOSE: 99 mg/dL (ref 70–99)
Potassium: 3.6 mmol/L (ref 3.5–5.1)
Sodium: 139 mmol/L (ref 135–145)

## 2014-12-17 LAB — T3, FREE: T3, Free: 1.6 pg/mL — ABNORMAL LOW (ref 2.0–4.4)

## 2014-12-17 NOTE — Progress Notes (Signed)
Patient ID: ANGELES Camacho, female   DOB: 1932/07/20, 81 y.o.   MRN: [id number]    Subjective: Less dyspnic legs still heavy  Objective: Vital signs in last 24 hours: Temp:  [97.9 F (36.6 C)-98.3 F (36.8 C)] 97.9 F (36.6 C) (04/09 0500) Pulse Rate:  [49-108] 108 (04/09 0500) Resp:  [17-29] 29 (04/09 0500) BP: (93-121)/(51-81) 108/57 mmHg (04/09 0500) SpO2:  [94 %-98 %] 96 % (04/09 0500) Weight:  [231 lb 11.2 oz (105.098 kg)] 231 lb 11.2 oz (105.098 kg) (04/09 0500) Last BM Date: 12/16/14  Intake/Output from previous day: 04/08 0701 - 04/09 0700 In: 240 [P.O.:240] Out: 1400 [Urine:1400] Intake/Output this shift:    Medications Current Facility-Administered Medications  Medication Dose Route Frequency Provider Last Rate Last Dose  . 0.9 %  sodium chloride infusion  250 mL Intravenous PRN Stephani Police, PA-C      . acetaminophen (TYLENOL) tablet 650 mg  650 mg Oral Q4H PRN Stephani Police, PA-C   650 mg at 12/17/14 0253  . apixaban (ELIQUIS) tablet 5 mg  5 mg Oral BID Stephani Police, PA-C   5 mg at 12/17/14 1003  . bisoprolol (ZEBETA) tablet 5 mg  5 mg Oral Daily Stephani Police, PA-C   5 mg at 12/17/14 1004  . colesevelam [facility]) tablet 1,875 mg  1,875 mg Oral BID WC Stephani Police, PA-C   1,875 mg at 12/17/14 0742  . docusate sodium (COLACE) capsule 100 mg  100 mg Oral BID Rodolph Bong, MD   100 mg at 12/16/14 1729  . furosemide (LASIX) injection 60 mg  60 mg Intravenous Q12H Rodolph Bong, MD   60 mg at 12/17/14 0522  . gabapentin (NEURONTIN) capsule 300 mg  300 mg Oral BID Stephani Police, PA-C   300 mg at 12/17/14 1004  . gi cocktail (Maalox,Lidocaine,Donnatal)  30 mL Oral BID PRN Stephani Police, PA-C   30 mL at 12/14/14 1640  . insulin aspart (novoLOG) injection 0-9 Units  0-9 Units Subcutaneous TID WC Rodolph Bong, MD   1 Units at 12/15/14 1735  . levothyroxine (SYNTHROID, LEVOTHROID) tablet 25 mcg  25 mcg Oral QAC breakfast Rodolph Bong, MD   25  mcg at 12/17/14 0741  . multivitamin with minerals tablet 1 tablet  1 tablet Oral Daily Stephani Police, PA-C   1 tablet at 12/17/14 1003  . multivitamin-lutein (OCUVITE-LUTEIN) capsule 2 capsule  2 capsule Oral Daily Stephani Police, PA-C   2 capsule at 12/17/14 1004  . nystatin cream (MYCOSTATIN)   Topical BID Stephani Police, PA-C      . ondansetron [facility]) injection 4 mg  4 mg Intravenous Q6H PRN Stephani Police, PA-C      . oxycodone (OXY-IR) immediate release capsule 5 mg  5 mg Oral Q6H PRN Stephani Police, PA-C      . pantoprazole (PROTONIX) EC tablet 40 mg  40 mg Oral QHS Rodolph Bong, MD   40 mg at 12/16/14 2200  . potassium chloride SA (K-DUR,KLOR-CON) CR tablet 40 mEq  40 mEq Oral Daily Rodolph Bong, MD   40 mEq at 12/17/14 1004  . rOPINIRole (REQUIP) tablet 0.25 mg  0.25 mg Oral TID PRN Laurey Morale, MD   0.25 mg at 12/14/14 2351  . sodium chloride 0.9 % injection 3 mL  3 mL Intravenous Q12H Marianne L York, PA-C   3 mL at 12/17/14 1006  . sodium  chloride 0.9 % injection 3 mL  3 mL Intravenous PRN Stephani Police, PA-C        PE:  General: Alert, oriented x3, no distress Head: no evidence of trauma, PERRL, EOMI, no exophtalmos or lid lag, no myxedema, no xanthelasma; normal ears, nose and oropharynx Neck: 8-9 cm jugular venous pulsations and no additional hepatojugular reflux; brisk carotid pulses without delay and no carotid bruits Chest: clear to auscultation, no signs of consolidation by percussion or palpation, normal fremitus, symmetrical and full respiratory excursions Cardiovascular: normal position and quality of the apical impulse, regular rhythm, normal first and second heart sounds, no murmurs, rubs or gallops Abdomen: no tenderness or distention, no masses by palpation, no abnormal pulsatility or arterial bruits, normal bowel sounds, no hepatosplenomegaly Extremities: no clubbing, cyanosis; 3+ edema to the thighs bilaterally, starting to show some wrinkles;  some erythema, no clear cellulitis; 2+ radial, ulnar and brachial pulses bilaterally; 2+ right femoral, posterior tibial and dorsalis pedis pulses; 2+ left femoral, posterior tibial and dorsalis pedis pulses; no subclavian or femoral bruits Neurological: grossly nonfocal    Lab Results:   Recent Labs  12/14/14 1410 12/17/14 0320  WBC 4.2 4.9  HGB 10.9* 10.4*  HCT 35.7* 33.4*  PLT 201 161   BMET  Recent Labs  12/15/14 0414 12/16/14 0400 12/17/14 0320  NA 139 139 139  K 4.4 3.6 3.6  CL 106 104 106  CO2 23 26 29   GLUCOSE 83 89 99  BUN 31* 31* 32*  CREATININE 1.33* 1.31* 1.21*  CALCIUM 8.9 9.0 8.6    Assessment/Plan  Diastolic CHF:  Down 1.4 L no change in weight continue iv diuresis will take another 2-3 days AVR:  Tissue no AR on exam   Afib:  Good rate control on eliqis      LOS: 3 days    Charlton Haws PA-C 12/17/2014 12:04 PM

## 2014-12-17 NOTE — Progress Notes (Signed)
Preceptor different finding documented at 2030

## 2014-12-17 NOTE — Progress Notes (Signed)
[facility] PROGRESS NOTE  Melanie Camacho:[id number] DOB: 1932/07/02 DOA: 12/14/2014 PCP: PROVIDER NOT IN SYSTEM  Assessment/Plan: #1 acute on chronic diastolic CHF exacerbation Questionable etiology. Cardiac enzymes minimally elevated however seems to have plateaued. BNP was elevated on admission at 1222.9. 2-D echo with EF of 55-60% with no wall motion abnormalities. Aortic valve of the prosthesis was present with normal range of motion no evidence of dehiscence. I/O equal -5.595 L during the hospitalization. Current weight is 105.098 kg from 104.6 kg from 104.96 kg from 105.23 kg on admission. Continue current dose of IV Lasix. Continue zebeta, welchol. Cardiology following and appreciate input and recommendations.  #2 atrial fibrillation Currently rate controlled on beta blocker. Continue eliquis for anticoagulation.  #3 status post AVR 2011 at [facility]. 2-D echo with prosthesis present with normal range of motion and no evidence of dehiscence. Per cardiology.  #4 well-controlled diabetes mellitus diet-controlled Hemoglobin A1c is 5.5. CBGs have ranged from 99-110. Sliding scale insulin.  #5 hypothyroidism Patient with prior history of hypothyroidism however per family patient had been off thyroid medication for approximately 3 years. Patient noted to be volume overloaded with complaints of feeling cold all the time and lethargic. TSH obtained is elevated at 7.734. Free T4 is decreased at 0.75. Free T3 is decreased at 1.6 total T3 was decreased at 66. Continue Synthroid 25 mcg daily. Patient will need repeat thyroid function studies done in about 4-6 weeks as outpatient.  #6 prophylaxis Protonix for GI prophylaxis. Eliquis for anticoagulation.   Code Status: Full Family Communication: Updated patient no family present. Disposition Plan: Remain inpatient.   Consultants:  Cardiology: Dr. Allyson Sabal 12/14/2014  Procedures:  Chest x-ray 12/15/2014, 12/14/2014  2-D echo  12/15/2014    Antibiotics:  None  HPI/Subjective: Patient states SOB improving.  Objective: Filed Vitals:   12/17/14 0500  BP: 108/57  Pulse: 108  Temp: 97.9 F (36.6 C)  Resp: 29    Intake/Output Summary (Last 24 hours) at 12/17/14 1312 Last data filed at 12/17/14 1200  Gross per 24 hour  Intake    240 ml  Output   2150 ml  Net  -1910 ml   Filed Weights   12/15/14 0536 12/16/14 0500 12/17/14 0500  Weight: 104.962 kg (231 lb 6.4 oz) 104.6 kg (230 lb 9.6 oz) 105.098 kg (231 lb 11.2 oz)    Exam:   General:  NAD. Sitting up.  Cardiovascular: Irregularly irregular, positive JVD.  Respiratory: Bibasilar crackles.  Abdomen: Soft, positive bowel sounds, nontender to palpation, lower abdominal wall edema.  Musculoskeletal: No clubbing cyanosis. 2-3+ bilateral lower extremity edema to the hips.  Data Reviewed: Basic Metabolic Panel:  Recent Labs Lab 12/14/14 1410 12/15/14 0414 12/16/14 0400 12/17/14 0320  NA 139 139 139 139  K 5.9* 4.4 3.6 3.6  CL 107 106 104 106  CO2 GLUCOSE 82 83 89 99  BUN 33* 31* 31* 32*  CREATININE 1.33* 1.33* 1.31* 1.21*  CALCIUM 9.1 8.9 9.0 8.6   Liver Function Tests: No results for input(s): AST, ALT, ALKPHOS, BILITOT, PROT, ALBUMIN in the last 168 hours. No results for input(s): LIPASE, AMYLASE in the last 168 hours. No results for input(s): AMMONIA in the last 168 hours. CBC:  Recent Labs Lab 12/14/14 1410 12/17/14 0320  WBC 4.2 4.9  HGB 10.9* 10.4*  HCT 35.7* 33.4*  MCV 101.7* 101.5*  PLT 201 161   Cardiac Enzymes:  Recent Labs Lab 12/14/14 1616 12/14/14 2150 12/15/14 0414  TROPONINI 0.05* 0.04* 0.04*   BNP (last 3 results)  Recent Labs  12/14/14 1410  BNP 1222.9*    ProBNP (last 3 results) No results for input(s): PROBNP in the last 8760 hours.  CBG:  Recent Labs Lab 12/16/14 1142 12/16/14 1645 12/16/14 2216 12/17/14 0749 12/17/14 1204  GLUCAP 83 96 99 110* 103*    Recent  Results (from the past 240 hour(s))  MRSA PCR Screening     Status: None   Collection Time: 12/14/14  7:46 PM  Result Value Ref Range Status   MRSA by PCR NEGATIVE NEGATIVE Final    Comment:        The GeneXpert MRSA Assay (FDA approved for NASAL specimens only), is one component of a comprehensive MRSA colonization surveillance program. It is not intended to diagnose MRSA infection nor to guide or monitor treatment for MRSA infections.      Studies: No results found.  Scheduled Meds: . apixaban  5 mg Oral BID  . bisoprolol  5 mg Oral Daily  . colesevelam  1,875 mg Oral BID WC  . docusate sodium  100 mg Oral BID  . furosemide  60 mg Intravenous Q12H  . gabapentin  300 mg Oral BID  . insulin aspart  0-9 Units Subcutaneous TID WC  . levothyroxine  25 mcg Oral QAC breakfast  . multivitamin with minerals  1 tablet Oral Daily  . multivitamin-lutein  2 capsule Oral Daily  . nystatin cream   Topical BID  . pantoprazole  40 mg Oral QHS  . potassium chloride  40 mEq Oral Daily  . sodium chloride  3 mL Intravenous Q12H   Continuous Infusions:   Principal Problem:   Acute on chronic diastolic heart failure Active Problems:   Chronic a-fib   S/P AVR-(tissue) 2011 at [facility] - no CABG with this   HTN- (transient hypotension during recent admission)   Hypothyroid   Obesity- BMI 45   DM (diabetes mellitus)   Left sided chest pain    Time spent: 40 minutes    THOMPSON,DANIEL M.D. [facility] Pager [phone]. If 7PM-7AM, please contact night-coverage at [email or web address], password [facility] 12/17/2014, 1:12 PM  LOS: 3 days

## 2014-12-18 LAB — BASIC METABOLIC PANEL
Anion gap: 10 (ref 5–15)
BUN: 33 mg/dL — AB (ref 6–23)
CHLORIDE: 103 mmol/L (ref 96–112)
CO2: 26 mmol/L (ref 19–32)
CREATININE: 1.19 mg/dL — AB (ref 0.50–1.10)
Calcium: 8.5 mg/dL (ref 8.4–10.5)
GFR, EST AFRICAN AMERICAN: 49 mL/min — AB (ref >90)
GFR, EST NON AFRICAN AMERICAN: 42 mL/min — AB (ref >90)
GLUCOSE: 71 mg/dL (ref 70–99)
POTASSIUM: 3.8 mmol/L (ref 3.5–5.1)
Sodium: 139 mmol/L (ref 135–145)

## 2014-12-18 LAB — CBC
HCT: 36.8 % (ref 36.0–46.0)
Hemoglobin: 11.8 g/dL — ABNORMAL LOW (ref 12.0–15.0)
MCH: 31.9 pg (ref 26.0–34.0)
MCHC: 32.1 g/dL (ref 30.0–36.0)
MCV: 99.5 fL (ref 78.0–100.0)
Platelets: 145 10*3/uL — ABNORMAL LOW (ref 150–400)
RBC: 3.7 MIL/uL — ABNORMAL LOW (ref 3.87–5.11)
RDW: 16.7 % — AB (ref 11.5–15.5)
WBC: 4.8 10*3/uL (ref 4.0–10.5)

## 2014-12-18 LAB — GLUCOSE, CAPILLARY
GLUCOSE-CAPILLARY: 85 mg/dL (ref 70–99)
Glucose-Capillary: 84 mg/dL (ref 70–99)
Glucose-Capillary: 96 mg/dL (ref 70–99)

## 2014-12-18 MED ORDER — LORAZEPAM 0.5 MG PO TABS
0.5000 mg | ORAL_TABLET | Freq: Once | ORAL | Status: AC
  Administered 2014-12-18: 0.5 mg via ORAL
  Filled 2014-12-18: qty 1

## 2014-12-18 MED ORDER — CYCLOBENZAPRINE HCL 10 MG PO TABS: 5.0000 mg | ORAL_TABLET | Freq: Three times a day (TID) | ORAL | Status: DC | PRN

## 2014-12-18 NOTE — Progress Notes (Signed)
[facility] PROGRESS NOTE  Melanie Camacho:[id number] DOB: 1932-04-16 DOA: 12/14/2014 PCP: PROVIDER NOT IN SYSTEM  Assessment/Plan: #1 acute on chronic diastolic CHF exacerbation Questionable etiology. Cardiac enzymes minimally elevated however seems to have plateaued. BNP was elevated on admission at 1222.9. 2-D echo with EF of 55-60% with no wall motion abnormalities. Aortic valve of the prosthesis was present with normal range of motion no evidence of dehiscence. I/O equal -8.602 L during the hospitalization. Current weight is 105.098 kg from 104.6 kg from 104.96 kg from 105.23 kg on admission. Continue current dose of IV Lasix. Continue zebeta, welchol. Cardiology following and appreciate input and recommendations.  #2 atrial fibrillation Currently rate controlled on beta blocker. Continue eliquis for anticoagulation.  #3 status post AVR 2011 at [facility]. 2-D echo with prosthesis present with normal range of motion and no evidence of dehiscence. Per cardiology.  #4 well-controlled diabetes mellitus diet-controlled Hemoglobin A1c is 5.5. CBGs have ranged from 84-125. Sliding scale insulin.  #5 hypothyroidism Patient with prior history of hypothyroidism however per family patient had been off thyroid medication for approximately 3 years. Patient noted to be volume overloaded with complaints of feeling cold all the time and lethargic. TSH obtained is elevated at 7.734. Free T4 is decreased at 0.75. Free T3 is decreased at 1.6 total T3 was decreased at 66. Continue Synthroid 25 mcg daily. Patient will need repeat thyroid function studies done in about 4-6 weeks as outpatient.  #6 prophylaxis Protonix for GI prophylaxis. Eliquis for anticoagulation.   Code Status: Full Family Communication: Updated patient no family present. Disposition Plan: Remain inpatient.   Consultants:  Cardiology: Dr. Allyson Sabal 12/14/2014  Procedures:  Chest x-ray 12/15/2014, 12/14/2014  2-D echo  12/15/2014    Antibiotics:  None  HPI/Subjective: Patient states SOB on minimal exertion. No CP.  Objective: Filed Vitals:   12/18/14 0505  BP: 96/52  Pulse: 76  Temp: 97.8 F (36.6 C)  Resp: 21    Intake/Output Summary (Last 24 hours) at 12/18/14 1239 Last data filed at 12/18/14 1030  Gross per 24 hour  Intake    243 ml  Output   3250 ml  Net  -3007 ml   Filed Weights   12/16/14 0500 12/17/14 0500 12/18/14 0505  Weight: 104.6 kg (230 lb 9.6 oz) 105.098 kg (231 lb 11.2 oz) 105.233 kg (232 lb)    Exam:   General:  NAD.   Cardiovascular: Irregularly irregular, positive JVD.  Respiratory: Bibasilar crackles.  Abdomen: Soft, positive bowel sounds, nontender to palpation, lower abdominal wall edema.  Musculoskeletal: No clubbing cyanosis. 2-3+ bilateral lower extremity edema to the hips.  Data Reviewed: Basic Metabolic Panel:  Recent Labs Lab 12/14/14 1410 12/15/14 0414 12/16/14 0400 12/17/14 0320 12/18/14 0637  NA 139 139 139 139 139  K 5.9* 4.4 3.6 3.6 3.8  CL 107 106 104 106 103  CO2 GLUCOSE 82 83 89 99 71  BUN 33* 31* 31* 32* 33*  CREATININE 1.33* 1.33* 1.31* 1.21* 1.19*  CALCIUM 9.1 8.9 9.0 8.6 8.5   Liver Function Tests: No results for input(s): AST, ALT, ALKPHOS, BILITOT, PROT, ALBUMIN in the last 168 hours. No results for input(s): LIPASE, AMYLASE in the last 168 hours. No results for input(s): AMMONIA in the last 168 hours. CBC:  Recent Labs Lab 12/14/14 1410 12/17/14 0320 12/18/14 0637  WBC 4.2 4.9 4.8  HGB 10.9* 10.4* 11.8*  HCT 35.7* 33.4* 36.8  MCV 101.7* 101.5* 99.5  PLT 201 161 145*   Cardiac Enzymes:  Recent Labs Lab 12/14/14 1616 12/14/14 2150 12/15/14 0414  TROPONINI 0.05* 0.04* 0.04*   BNP (last 3 results)  Recent Labs  12/14/14 1410  BNP 1222.9*    ProBNP (last 3 results) No results for input(s): PROBNP in the last 8760 hours.  CBG:  Recent Labs Lab 12/17/14 1204 12/17/14 1704  12/17/14 2119 12/18/14 0812 12/18/14 1143  GLUCAP 103* 105* 125* 85 84    Recent Results (from the past 240 hour(s))  MRSA PCR Screening     Status: None   Collection Time: 12/14/14  7:46 PM  Result Value Ref Range Status   MRSA by PCR NEGATIVE NEGATIVE Final    Comment:        The GeneXpert MRSA Assay (FDA approved for NASAL specimens only), is one component of a comprehensive MRSA colonization surveillance program. It is not intended to diagnose MRSA infection nor to guide or monitor treatment for MRSA infections.      Studies: No results found.  Scheduled Meds: . apixaban  5 mg Oral BID  . bisoprolol  5 mg Oral Daily  . colesevelam  1,875 mg Oral BID WC  . docusate sodium  100 mg Oral BID  . furosemide  60 mg Intravenous Q12H  . gabapentin  300 mg Oral BID  . insulin aspart  0-9 Units Subcutaneous TID WC  . levothyroxine  25 mcg Oral QAC breakfast  . multivitamin with minerals  1 tablet Oral Daily  . multivitamin-lutein  2 capsule Oral Daily  . nystatin cream   Topical BID  . pantoprazole  40 mg Oral QHS  . potassium chloride  40 mEq Oral Daily  . sodium chloride  3 mL Intravenous Q12H   Continuous Infusions:   Principal Problem:   Acute on chronic diastolic heart failure Active Problems:   Chronic a-fib   S/P AVR-(tissue) 2011 at [facility] - no CABG with this   HTN- (transient hypotension during recent admission)   Hypothyroid   Obesity- BMI 45   DM (diabetes mellitus)   Left sided chest pain   Type 2 diabetes mellitus without complication    Time spent: 40 minutes    THOMPSON,DANIEL M.D. [facility] Pager [phone]. If 7PM-7AM, please contact night-coverage at [email or web address], password [facility] 12/18/2014, 12:39 PM  LOS: 4 days

## 2014-12-18 NOTE — Progress Notes (Signed)
Patient ID: Melanie Camacho, female   DOB: 04/18/1932, 81 y.o.   MRN: [id number]    Subjective: Slept better   Objective: Vital signs in last 24 hours: Temp:  [97.7 F (36.5 C)-98.8 F (37.1 C)] 97.8 F (36.6 C) (04/10 0505) Pulse Rate:  [76-93] 76 (04/10 0505) Resp:  [21-25] 21 (04/10 0505) BP: (96-110)/(45-64) 96/52 mmHg (04/10 0505) SpO2:  [96 %-100 %] 96 % (04/10 0505) Weight:  [232 lb (105.233 kg)] 232 lb (105.233 kg) (04/10 0505) Last BM Date: 12/17/14  Intake/Output from previous day: 04/09 0701 - 04/10 0700 In: 240 [P.O.:240] Out: 2300 [Urine:2300] Intake/Output this shift:    Medications Current Facility-Administered Medications  Medication Dose Route Frequency Provider Last Rate Last Dose  . 0.9 %  sodium chloride infusion  250 mL Intravenous PRN Stephani Police, PA-C      . acetaminophen (TYLENOL) tablet 650 mg  650 mg Oral Q4H PRN Stephani Police, PA-C   650 mg at 12/17/14 2046  . apixaban (ELIQUIS) tablet 5 mg  5 mg Oral BID Stephani Police, PA-C   5 mg at 12/17/14 2221  . bisoprolol (ZEBETA) tablet 5 mg  5 mg Oral Daily Stephani Police, PA-C   5 mg at 12/17/14 1004  . colesevelam [facility]) tablet 1,875 mg  1,875 mg Oral BID WC Stephani Police, PA-C   1,875 mg at 12/17/14 1750  . docusate sodium (COLACE) capsule 100 mg  100 mg Oral BID Rodolph Bong, MD   100 mg at 12/17/14 2221  . furosemide (LASIX) injection 60 mg  60 mg Intravenous Q12H Rodolph Bong, MD   60 mg at 12/18/14 0615  . gabapentin (NEURONTIN) capsule 300 mg  300 mg Oral BID Stephani Police, PA-C   300 mg at 12/17/14 2221  . gi cocktail (Maalox,Lidocaine,Donnatal)  30 mL Oral BID PRN Stephani Police, PA-C   30 mL at 12/14/14 1640  . insulin aspart (novoLOG) injection 0-9 Units  0-9 Units Subcutaneous TID WC Rodolph Bong, MD   1 Units at 12/15/14 1735  . levothyroxine (SYNTHROID, LEVOTHROID) tablet 25 mcg  25 mcg Oral QAC breakfast Rodolph Bong, MD   25 mcg at 12/18/14 [phone]  .  multivitamin with minerals tablet 1 tablet  1 tablet Oral Daily Stephani Police, PA-C   1 tablet at 12/17/14 1003  . multivitamin-lutein (OCUVITE-LUTEIN) capsule 2 capsule  2 capsule Oral Daily Stephani Police, PA-C   2 capsule at 12/17/14 1004  . nystatin cream (MYCOSTATIN)   Topical BID Stephani Police, PA-C      . ondansetron [facility]) injection 4 mg  4 mg Intravenous Q6H PRN Stephani Police, PA-C      . oxycodone (OXY-IR) immediate release capsule 5 mg  5 mg Oral Q6H PRN Stephani Police, PA-C      . pantoprazole (PROTONIX) EC tablet 40 mg  40 mg Oral QHS Rodolph Bong, MD   40 mg at 12/17/14 2221  . potassium chloride SA (K-DUR,KLOR-CON) CR tablet 40 mEq  40 mEq Oral Daily Rodolph Bong, MD   40 mEq at 12/17/14 1004  . rOPINIRole (REQUIP) tablet 0.25 mg  0.25 mg Oral TID PRN Laurey Morale, MD   0.25 mg at 12/17/14 2007  . sodium chloride 0.9 % injection 3 mL  3 mL Intravenous Q12H Stephani Police, PA-C   3 mL at 12/17/14 2222  . sodium chloride 0.9 % injection 3 mL  3 mL Intravenous PRN Stephani Police, PA-C        PE:  General: Alert, oriented x3, no distress Head: no evidence of trauma, PERRL, EOMI, no exophtalmos or lid lag, no myxedema, no xanthelasma; normal ears, nose and oropharynx Neck: 8-9 cm jugular venous pulsations and no additional hepatojugular reflux; brisk carotid pulses without delay and no carotid bruits Chest: clear to auscultation, no signs of consolidation by percussion or palpation, normal fremitus, symmetrical and full respiratory excursions Cardiovascular: normal position and quality of the apical impulse, regular rhythm, normal first and second heart sounds, no murmurs, rubs or gallops Abdomen: no tenderness or distention, no masses by palpation, no abnormal pulsatility or arterial bruits, normal bowel sounds, no hepatosplenomegaly Extremities: no clubbing, cyanosis; 3+ edema to the thighs bilaterally, starting to show some wrinkles; some erythema, no clear  cellulitis; 2+ radial, ulnar and brachial pulses bilaterally; 2+ right femoral, posterior tibial and dorsalis pedis pulses; 2+ left femoral, posterior tibial and dorsalis pedis pulses; no subclavian or femoral bruits Neurological: grossly nonfocal    Lab Results:   Recent Labs  12/17/14 0320  WBC 4.9  HGB 10.4*  HCT 33.4*  PLT 161   BMET  Recent Labs  12/16/14 0400 12/17/14 0320  NA 139 139  K 3.6 3.6  CL 104 106  CO2 26 29  GLUCOSE 89 99  BUN 31* 32*  CREATININE 1.31* 1.21*  CALCIUM 9.0 8.6    Assessment/Plan  Diastolic CHF:  Down 2 L ? Accuracy as input very low no change in weight continue iv diuresis will take another 2-3 days CR/K are ok  AVR:  Tissue no AR on exam   Afib:  Good rate control on eliqis      LOS: 4 days    Charlton Haws PA-C 12/18/2014 7:52 AM

## 2014-12-19 LAB — GLUCOSE, CAPILLARY
GLUCOSE-CAPILLARY: 106 mg/dL — AB (ref 70–99)
GLUCOSE-CAPILLARY: 94 mg/dL (ref 70–99)
Glucose-Capillary: 75 mg/dL (ref 70–99)
Glucose-Capillary: 95 mg/dL (ref 70–99)
Glucose-Capillary: 119 mg/dL — ABNORMAL HIGH (ref 70–99)

## 2014-12-19 LAB — CBC
HCT: 31.4 % — ABNORMAL LOW (ref 36.0–46.0)
HEMOGLOBIN: 10.1 g/dL — AB (ref 12.0–15.0)
MCH: 32 pg (ref 26.0–34.0)
MCHC: 32.2 g/dL (ref 30.0–36.0)
MCV: 99.4 fL (ref 78.0–100.0)
Platelets: UNDETERMINED 10*3/uL (ref 150–400)
RBC: 3.16 MIL/uL — AB (ref 3.87–5.11)
RDW: 16.4 % — ABNORMAL HIGH (ref 11.5–15.5)
WBC: 4.3 10*3/uL (ref 4.0–10.5)

## 2014-12-19 LAB — BASIC METABOLIC PANEL
ANION GAP: 5 (ref 5–15)
BUN: 32 mg/dL — ABNORMAL HIGH (ref 6–23)
CHLORIDE: 106 mmol/L (ref 96–112)
CO2: 28 mmol/L (ref 19–32)
CREATININE: 1.21 mg/dL — AB (ref 0.50–1.10)
Calcium: 8.4 mg/dL (ref 8.4–10.5)
GFR calc non Af Amer: 41 mL/min — ABNORMAL LOW (ref >90)
GFR, EST AFRICAN AMERICAN: 48 mL/min — AB (ref >90)
Glucose, Bld: 81 mg/dL (ref 70–99)
Potassium: 3.7 mmol/L (ref 3.5–5.1)
SODIUM: 139 mmol/L (ref 135–145)

## 2014-12-19 NOTE — Clinical Social Work Psychosocial (Signed)
     Clinical Social Work Department BRIEF PSYCHOSOCIAL ASSESSMENT 12/19/2014  Patient:  Melanie Camacho, Melanie Camacho     Account Number:  [id number]     Admit date:  12/14/2014  Clinical Social Worker:  Harless Nakayama  Date/Time:  12/19/2014 01:47 PM  Referred by:  Physician  Date Referred:  12/19/2014 Referred for  SNF Placement   Other Referral:   Interview type:  Patient Other interview type:    PSYCHOSOCIAL DATA Living Status:  WITH ADULT CHILDREN Admitted from facility:   Level of care:   Primary support name:  Lovey Newcomer Primary support relationship to patient:  CHILD, ADULT Degree of support available:   Pt has strong family support    CURRENT CONCERNS Current Concerns  Post-Acute Placement   Other Concerns:    SOCIAL WORK ASSESSMENT / PLAN CSW visited pt room to speak with pt about PT recommendation. Pt seated in recliner in good spirits during conversation. Pt informed CSW she was living at home alone but after previous hospital admission she has been staying with her daughter. Pt informed CSW that she spoke with her children yesterday and they all decided it would be best for pt to dc to SNF for ST rehab at dc. Pt informed CSW that her children are currently looking into facilities. CSW explained SNF referral process. Pt informed CSW her daughter is looking at facilities in [place] and pt is familiar with facilities in [place] where her home is located. Pt is agreeable to referral being sent to all [facility] and [facility] facilities. Pt was agreeable to CSW speaking with her daughter Cyndi to confirm they did not want referral to be sent to [facility] or any other county. Pt seemed to have a good understanding of her medical condition and is coping well.   Assessment/plan status:  Psychosocial Support/Ongoing Assessment of Needs Other assessment/ plan:   Information/referral to community resources:   SNF list to be provided with bed offers    PATIENTS/FAMILYS  RESPONSE TO PLAN OF CARE: Pt pleasant and cooperative. Pt in agreement that SNF will be safest dc plan.      Lynzi Meulemans, LCSWA  [phone]

## 2014-12-19 NOTE — Progress Notes (Signed)
Physical Therapy Treatment Patient Details Name: TORUNN CHANCELLOR MRN: [id number] DOB: Nov 29, 1931 Today's Date: 12/19/2014    History of Present Illness Pt adm with acute on chronic diastolic heart failure. PMH - CAD, NSTEMI, AVR, DM, HTN, CHF, a-fib.    PT Comments    Pt making slow, steady  progress due to weakness, fear of falling and severe arthritis. Pt uses lift chair at home. Significant difficulty coming to stand from low recliner.  Follow Up Recommendations  SNF     Equipment Recommendations  None recommended by PT    Recommendations for Other Services       Precautions / Restrictions Precautions Precautions: Fall Precaution Comments: very fearful of falling    Mobility  Bed Mobility Overal bed mobility: Needs Assistance Bed Mobility: Sit to Supine       Sit to supine: +2 for physical assistance;Max assist   General bed mobility comments: Assist to control descent of trunk and to bring legs up into bed.   Transfers Overall transfer level: Needs assistance Equipment used: 2 person hand held assist Transfers: Sit to/from Stand Sit to Stand: +2 physical assistance;Mod assist         General transfer comment: Pt in low recliner and took 3 attempts to stand. Per pt cues used support under elbow and hand-held assist on each side for successful transfer to standing.  Ambulation/Gait Ambulation/Gait assistance: Min assist;+2 safety/equipment Ambulation Distance (Feet): 7 Feet Assistive device: Rolling walker (2 wheeled) Gait Pattern/deviations: Step-to pattern;Decreased step length - right;Decreased step length - left;Shuffle;Trunk flexed Gait velocity: decr Gait velocity interpretation: Below normal speed for age/gender General Gait Details: Assist for balance and safety. Cues to look up. Knees and shoulders with severe crepitus with amb.   Stairs            Wheelchair Mobility    Modified Rankin (Stroke Patients Only)       Balance Overall  balance assessment: Needs assistance Sitting-balance support: No upper extremity supported;Feet supported Sitting balance-Leahy Scale: Fair     Standing balance support: Bilateral upper extremity supported Standing balance-Leahy Scale: Poor Standing balance comment: support of walker and min A for static standing.                    Cognition Arousal/Alertness: Awake/alert Behavior During Therapy: WFL for tasks assessed/performed Overall Cognitive Status: Within Functional Limits for tasks assessed                      Exercises      General Comments        Pertinent Vitals/Pain Pain Assessment: No/denies pain    Home Living                      Prior Function            PT Goals (current goals can now be found in the care plan section) Progress towards PT goals: Progressing toward goals    Frequency  Min 2X/week    PT Plan Current plan remains appropriate;Frequency needs to be updated    Co-evaluation             End of Session Equipment Utilized During Treatment: Gait belt Activity Tolerance: Patient tolerated treatment well Patient left: in bed;with call bell/phone within reach     Time: 4098-1191 PT Time Calculation (min) (ACUTE ONLY): 15 min  Charges:  $Gait Training: 8-22 mins  G Codes:      Laureen Frederic 12/19/2014, 2:13 PM  [facility] PT [phone]

## 2014-12-19 NOTE — Clinical Social Work Placement (Addendum)
    Clinical Social Work Department CLINICAL SOCIAL WORK PLACEMENT NOTE 12/19/2014  Patient:  Melanie Camacho, Melanie Camacho  Account Number:  [id number] Admit date:  12/14/2014  Clinical Social Worker:  Sharol Harness, Theresia Majors  Date/time:  12/19/2014 01:54 PM  Clinical Social Work is seeking post-discharge placement for this patient at the following level of care:   SKILLED NURSING   (*CSW will update this form in Epic as items are completed)   12/19/2014  Patient/family provided with [facility] Department of Clinical Social Work's list of facilities offering this level of care within the geographic area requested by the patient (or if unable, by the patient's family).  12/19/2014  Patient/family informed of their freedom to choose among providers that offer the needed level of care, that participate in Medicare, Medicaid or managed care program needed by the patient, have an available bed and are willing to accept the patient.  12/19/2014  Patient/family informed of [facility]' ownership interest in [facility], as well as of the fact that they are under no obligation to receive care at this facility.  PASARR submitted to EDS on existing PASARR number received on   FL2 transmitted to all facilities in geographic area requested by pt/family on  12/19/2014 FL2 transmitted to all facilities within larger geographic area on   Patient informed that his/her managed care company has contracts with or will negotiate with  certain facilities, including the following:  NA  Patient/family informed of bed offers received:  12/23/2014 Patient chooses bed at [facility], [place] Physician recommends and patient chooses bed at    [facility] [place] [facility] Patient to be transferred to facility by Family Patient and family notified of transfer on 12/30/14 Name of family member notified:  Arline Asp (daughter) and Colette- Daughter- to transport patient.  The following  physician request were entered in Epic: Physician Request  Please sign FL2.    Additional CommentsHarless Nakayama [phone]  12/30/14

## 2014-12-19 NOTE — Progress Notes (Signed)
[facility] PROGRESS NOTE  Melanie Camacho ZOX:[id number] DOB: [birth year] DOA: 12/14/2014 PCP: PROVIDER NOT IN SYSTEM  Assessment/Plan: #1 acute on chronic diastolic CHF exacerbation Questionable etiology. Cardiac enzymes minimally elevated however seems to have plateaued. BNP was elevated on admission at 1222.9. 2-D echo with EF of 55-60% with no wall motion abnormalities. Aortic valve of the prosthesis was present with normal range of motion no evidence of dehiscence. I/O equal -9.49 L during the hospitalization. Current weight is 105.78 from105.098 kg from 104.6 kg from 104.96 kg from 105.23 kg on admission. Weight likely not accurate. Continue current dose of IV Lasix, zebeta, welchol. Cardiology following and appreciate input and recommendations.  #2 atrial fibrillation Continue beta blocker for rate control. Continue eliquis for anticoagulation.  #3 status post AVR 2011 at [facility] 2-D echo with prosthesis present with normal range of motion and no evidence of dehiscence. Per cardiology.  #4 well-controlled diabetes mellitus diet-controlled Hemoglobin A1c is 5.5. CBGs have ranged from 95-106. Sliding scale insulin.  #5 hypothyroidism Patient with prior history of hypothyroidism, however per family patient had been off thyroid medication for approximately 3 years. Patient noted to be volume overloaded with complaints of feeling cold all the time and lethargic. TSH obtained is elevated at 7.734. Free T4 is decreased at 0.75. Free T3 is decreased at 1.6 total T3 was decreased at 66. Continue Synthroid 25 mcg daily. Patient will need repeat thyroid function studies done in about 4-6 weeks as outpatient.  #6 prophylaxis Protonix for GI prophylaxis. Eliquis for anticoagulation.   Code Status: Full Family Communication: Updated patient and daughter at bedside. Disposition Plan: Remain inpatient.   Consultants:  Cardiology: Dr. Allyson Sabal 12/14/2014  Procedures:  Chest x-ray  12/15/2014, 12/14/2014  2-D echo 12/15/2014    Antibiotics:  None  HPI/Subjective: Patient states SOB on minimal exertion, and slowly improving. No CP.  Objective: Filed Vitals:   12/19/14 0623  BP: 105/66  Pulse: 95  Temp: 97.7 F (36.5 C)  Resp: 21    Intake/Output Summary (Last 24 hours) at 12/19/14 1119 Last data filed at 12/19/14 0900  Gross per 24 hour  Intake    343 ml  Output   1350 ml  Net  -1007 ml   Filed Weights   12/17/14 0500 12/18/14 0505 12/19/14 0623  Weight: 105.098 kg (231 lb 11.2 oz) 105.233 kg (232 lb) 105.788 kg (233 lb 3.5 oz)    Exam:   General:  NAD.   Cardiovascular: Irregularly irregular, positive JVD.  Respiratory: Bibasilar crackles.  Abdomen: Soft, positive bowel sounds, nontender to palpation, lower abdominal wall edema.  Musculoskeletal: No clubbing cyanosis. 2-3+ bilateral lower extremity edema to the hips.  Data Reviewed: Basic Metabolic Panel:  Recent Labs Lab 12/15/14 0414 12/16/14 0400 12/17/14 0320 12/18/14 0637 12/19/14 0538  NA 139 139 139 139 139  K 4.4 3.6 3.6 3.8 3.7  CL 106 104 106 103 106  CO2 GLUCOSE 83 89 99 71 81  BUN 31* 31* 32* 33* 32*  CREATININE 1.33* 1.31* 1.21* 1.19* 1.21*  CALCIUM 8.9 9.0 8.6 8.5 8.4   Liver Function Tests: No results for input(s): AST, ALT, ALKPHOS, BILITOT, PROT, ALBUMIN in the last 168 hours. No results for input(s): LIPASE, AMYLASE in the last 168 hours. No results for input(s): AMMONIA in the last 168 hours. CBC:  Recent Labs Lab 12/14/14 1410 12/17/14 0320 12/18/14 0637 12/19/14 0538  WBC 4.2 4.9 4.8 4.3  HGB 10.9* 10.4* 11.8*  10.1*  HCT 35.7* 33.4* 36.8 31.4*  MCV 101.7* 101.5* 99.5 99.4  PLT 201 161 145* PLATELET CLUMPS NOTED ON SMEAR, UNABLE TO ESTIMATE   Cardiac Enzymes:  Recent Labs Lab 12/14/14 1616 12/14/14 2150 12/15/14 0414  TROPONINI 0.05* 0.04* 0.04*   BNP (last 3 results)  Recent Labs  12/14/14 1410  BNP 1222.9*     ProBNP (last 3 results) No results for input(s): PROBNP in the last 8760 hours.  CBG:  Recent Labs Lab 12/17/14 1704 12/17/14 2119 12/18/14 0812 12/18/14 1143 12/18/14 1705  GLUCAP 105* 125* 85 84 96    Recent Results (from the past 240 hour(s))  MRSA PCR Screening     Status: None   Collection Time: 12/14/14  7:46 PM  Result Value Ref Range Status   MRSA by PCR NEGATIVE NEGATIVE Final    Comment:        The GeneXpert MRSA Assay (FDA approved for NASAL specimens only), is one component of a comprehensive MRSA colonization surveillance program. It is not intended to diagnose MRSA infection nor to guide or monitor treatment for MRSA infections.      Studies: No results found.  Scheduled Meds: . apixaban  5 mg Oral BID  . bisoprolol  5 mg Oral Daily  . colesevelam  1,875 mg Oral BID WC  . docusate sodium  100 mg Oral BID  . furosemide  60 mg Intravenous Q12H  . gabapentin  300 mg Oral BID  . insulin aspart  0-9 Units Subcutaneous TID WC  . levothyroxine  25 mcg Oral QAC breakfast  . multivitamin with minerals  1 tablet Oral Daily  . multivitamin-lutein  2 capsule Oral Daily  . nystatin cream   Topical BID  . pantoprazole  40 mg Oral QHS  . potassium chloride  40 mEq Oral Daily  . sodium chloride  3 mL Intravenous Q12H   Continuous Infusions:   Principal Problem:   Acute on chronic diastolic heart failure Active Problems:   Chronic a-fib   S/P AVR-(tissue) 2011 at [facility] - no CABG with this   HTN- (transient hypotension during recent admission)   Hypothyroid   Obesity- BMI 45   DM (diabetes mellitus)   Left sided chest pain   Type 2 diabetes mellitus without complication    Time spent: 40 minutes    THOMPSON,DANIEL M.D. [facility] Pager [phone]. If 7PM-7AM, please contact night-coverage at [email or web address], password [facility] 12/19/2014, 11:19 AM  LOS: 5 days

## 2014-12-19 NOTE — Progress Notes (Signed)
Subjective: SOB.   Objective: Vital signs in last 24 hours: Temp:  [97.7 F (36.5 C)-98.2 F (36.8 C)] 97.7 F (36.5 C) (04/11 0623) Pulse Rate:  [71-103] 95 (04/11 0623) Resp:  [18-21] 21 (04/11 0623) BP: (90-124)/(50-66) 105/66 mmHg (04/11 0623) SpO2:  [93 %-97 %] 95 % (04/11 0623) Weight:  [233 lb 3.5 oz (105.788 kg)] 233 lb 3.5 oz (105.788 kg) (04/11 0623) Last BM Date: 12/18/14  Intake/Output from previous day: 04/10 0701 - 04/11 0700 In: 106 [P.O.:100; I.V.:6] Out: 3050 [Urine:3050] Intake/Output this shift: Total I/O In: 240 [P.O.:240] Out: -   Medications Current Facility-Administered Medications  Medication Dose Route Frequency Provider Last Rate Last Dose  . 0.9 %  sodium chloride infusion  250 mL Intravenous PRN Stephani Police, PA-C      . acetaminophen (TYLENOL) tablet 650 mg  650 mg Oral Q4H PRN Stephani Police, PA-C   650 mg at 12/17/14 2046  . apixaban (ELIQUIS) tablet 5 mg  5 mg Oral BID Stephani Police, PA-C   5 mg at 12/18/14 2213  . bisoprolol (ZEBETA) tablet 5 mg  5 mg Oral Daily Stephani Police, PA-C   5 mg at 12/18/14 1001  . colesevelam [facility]) tablet 1,875 mg  1,875 mg Oral BID WC Stephani Police, PA-C   1,875 mg at 12/19/14 0830  . cyclobenzaprine (FLEXERIL) tablet 5 mg  5 mg Oral TID PRN Rodolph Bong, MD      . docusate sodium (COLACE) capsule 100 mg  100 mg Oral BID Rodolph Bong, MD   100 mg at 12/18/14 2213  . furosemide (LASIX) injection 60 mg  60 mg Intravenous Q12H Rodolph Bong, MD   60 mg at 12/19/14 0631  . gabapentin (NEURONTIN) capsule 300 mg  300 mg Oral BID Stephani Police, PA-C   300 mg at 12/18/14 2213  . gi cocktail (Maalox,Lidocaine,Donnatal)  30 mL Oral BID PRN Stephani Police, PA-C   30 mL at 12/14/14 1640  . insulin aspart (novoLOG) injection 0-9 Units  0-9 Units Subcutaneous TID WC Rodolph Bong, MD   1 Units at 12/15/14 1735  . levothyroxine (SYNTHROID, LEVOTHROID) tablet 25 mcg  25 mcg Oral QAC breakfast  Rodolph Bong, MD   25 mcg at 12/19/14 0631  . multivitamin with minerals tablet 1 tablet  1 tablet Oral Daily Stephani Police, PA-C   1 tablet at 12/18/14 1001  . multivitamin-lutein (OCUVITE-LUTEIN) capsule 2 capsule  2 capsule Oral Daily Stephani Police, PA-C   2 capsule at 12/18/14 1001  . nystatin cream (MYCOSTATIN)   Topical BID Stephani Police, PA-C      . ondansetron [facility]) injection 4 mg  4 mg Intravenous Q6H PRN Stephani Police, PA-C      . oxycodone (OXY-IR) immediate release capsule 5 mg  5 mg Oral Q6H PRN Stephani Police, PA-C      . pantoprazole (PROTONIX) EC tablet 40 mg  40 mg Oral QHS Rodolph Bong, MD   40 mg at 12/18/14 2213  . potassium chloride SA (K-DUR,KLOR-CON) CR tablet 40 mEq  40 mEq Oral Daily Rodolph Bong, MD   40 mEq at 12/18/14 1001  . rOPINIRole (REQUIP) tablet 0.25 mg  0.25 mg Oral TID PRN Laurey Morale, MD   0.25 mg at 12/18/14 1432  . sodium chloride 0.9 % injection 3 mL  3 mL Intravenous Q12H Stephani Police, PA-C  3 mL at 12/18/14 2214  . sodium chloride 0.9 % injection 3 mL  3 mL Intravenous PRN Stephani Police, PA-C        PE: General appearance: alert, cooperative and no distress Lungs: Decreased/absent BS on the right Heart: irregularly irregular rhythm and No MRG Abdomen: +BS. nontender. pitting edema Extremities: tense 3+ pitting LEE Pulses: 2+ radials Skin: Warm, dry Neurologic: Grossly normal  Lab Results:   Recent Labs  12/17/14 0320 12/18/14 0637 12/19/14 0538  WBC 4.9 4.8 4.3  HGB 10.4* 11.8* 10.1*  HCT 33.4* 36.8 31.4*  PLT 161 145* PLATELET CLUMPS NOTED ON SMEAR, UNABLE TO ESTIMATE   BMET  Recent Labs  12/17/14 0320 12/18/14 0637 12/19/14 0538  NA 139 139 139  K 3.6 3.8 3.7  CL 106 103 106  CO2 GLUCOSE 99 71 81  BUN 32* 33* 32*  CREATININE 1.21* 1.19* 1.21*  CALCIUM 8.6 8.5 8.4    Assessment/Plan  Principal Problem:   Acute on chronic diastolic heart failure Anasarca. Net fluids:   -2.9L/-9.8L.  Lasix  IV BID.  Her daughter reported protein has been low in the past and mostly like low now.  She is not eating much. Recommended checking a CMP.   She has 40-50 pounds of fluid to go.  SCr stable.   Chronic a-fib  Eliquis, bisoprolol 5.  Rate controlled.    S/P AVR-(tissue) 2011 at [facility] - no CABG with this   HTN- (transient hypotension during recent admission)  Mild hypotension at times   Hypothyroid  Back on synthroid.    Obesity- BMI 45   DM (diabetes mellitus)   Left sided chest pain   Type 2 diabetes mellitus without complication    LOS: 5 days    HAGER, BRYAN PA-C 12/19/2014 10:00 AM  Agree with above assessment.  Patient is in no distress.  She remains fluid overloaded.  Continue IV Lasix.  Renal function remains stable on IV Lasix.  The lungs revealed diminished breath sounds at bases particularly on the right.  The heart reveals no S3 gallop.  Rhythm is irregular in atrial fibrillation with controlled ventricular response.

## 2014-12-20 ENCOUNTER — Inpatient Hospital Stay (HOSPITAL_COMMUNITY): Payer: Medicare Other

## 2014-12-20 LAB — BASIC METABOLIC PANEL
Anion gap: 11 (ref 5–15)
BUN: 33 mg/dL — ABNORMAL HIGH (ref 6–23)
CALCIUM: 8.7 mg/dL (ref 8.4–10.5)
CO2: 28 mmol/L (ref 19–32)
Chloride: 101 mmol/L (ref 96–112)
Creatinine, Ser: 1.21 mg/dL — ABNORMAL HIGH (ref 0.50–1.10)
GFR calc Af Amer: 48 mL/min — ABNORMAL LOW (ref >90)
GFR calc non Af Amer: 41 mL/min — ABNORMAL LOW (ref >90)
GLUCOSE: 127 mg/dL — AB (ref 70–99)
Potassium: 3.7 mmol/L (ref 3.5–5.1)
Sodium: 140 mmol/L (ref 135–145)

## 2014-12-20 LAB — GLUCOSE, CAPILLARY
GLUCOSE-CAPILLARY: 112 mg/dL — AB (ref 70–99)
Glucose-Capillary: 96 mg/dL (ref 70–99)
Glucose-Capillary: 110 mg/dL — ABNORMAL HIGH (ref 70–99)
Glucose-Capillary: 100 mg/dL — ABNORMAL HIGH (ref 70–99)

## 2014-12-20 NOTE — Progress Notes (Signed)
Patient Name: Melanie Camacho Date of Encounter: 12/20/2014     Principal Problem:   Acute on chronic diastolic heart failure Active Problems:   Chronic a-fib   S/P AVR-(tissue) 2011 at [facility] - no CABG with this   HTN- (transient hypotension during recent admission)   Hypothyroid   Obesity- BMI 45   DM (diabetes mellitus)   Left sided chest pain   Type 2 diabetes mellitus without complication    SUBJECTIVE  The patient slept poorly last night.  No chest pain but continues to have shortness of breath. She mentioned that her family is looking into nursing homes in the [facility] area.  CURRENT MEDS . apixaban  5 mg Oral BID  . bisoprolol  5 mg Oral Daily  . colesevelam  1,875 mg Oral BID WC  . docusate sodium  100 mg Oral BID  . furosemide  60 mg Intravenous Q12H  . gabapentin  300 mg Oral BID  . insulin aspart  0-9 Units Subcutaneous TID WC  . levothyroxine  25 mcg Oral QAC breakfast  . multivitamin with minerals  1 tablet Oral Daily  . multivitamin-lutein  2 capsule Oral Daily  . nystatin cream   Topical BID  . pantoprazole  40 mg Oral QHS  . potassium chloride  40 mEq Oral Daily  . sodium chloride  3 mL Intravenous Q12H    OBJECTIVE  Filed Vitals:   12/20/14 0422 12/20/14 0700 12/20/14 0742 12/20/14 0900  BP: 96/57  97/65 108/58  Pulse: 113  49 73  Temp: 97.9 F (36.6 C)   98.5 F (36.9 C)  TempSrc: Oral   Oral  Resp: 23  21 23   Height:      Weight:  231 lb 7.7 oz (105 kg)    SpO2: 93%  95% 95%    Intake/Output Summary (Last 24 hours) at 12/20/14 0929 Last data filed at 12/20/14 0500  Gross per 24 hour  Intake    840 ml  Output   1400 ml  Net   -560 ml   Filed Weights   12/18/14 0505 12/19/14 0623 12/20/14 0700  Weight: 232 lb (105.233 kg) 233 lb 3.5 oz (105.788 kg) 231 lb 7.7 oz (105 kg)    PHYSICAL EXAM  General: Pleasant, NAD. Neuro: Alert and oriented X 3. Moves all extremities spontaneously. Psych: Normal affect. HEENT:   Normal  Neck: Supple without bruits or JVD. Lungs:  Resp regular and unlabored, decreased breath sounds at bases Heart: Irregularly irregular no s3, s4, or murmurs. Abdomen: Soft, non-tender, non-distended, BS + x 4.  Extremities: 3+ edema with lower extremity ulcerations  Accessory Clinical Findings  CBC  Recent Labs  12/18/14 0637 12/19/14 0538  WBC 4.8 4.3  HGB 11.8* 10.1*  HCT 36.8 31.4*  MCV 99.5 99.4  PLT 145* PLATELET CLUMPS NOTED ON SMEAR, UNABLE TO ESTIMATE   Basic Metabolic Panel  Recent Labs  12/19/14 0538 12/20/14 0527  NA 139 140  K 3.7 3.7  CL 106 101  CO2 28 28  GLUCOSE 81 127*  BUN 32* 33*  CREATININE 1.21* 1.21*  CALCIUM 8.4 8.7   Liver Function Tests No results for input(s): AST, ALT, ALKPHOS, BILITOT, PROT, ALBUMIN in the last 72 hours. No results for input(s): LIPASE, AMYLASE in the last 72 hours. Cardiac Enzymes No results for input(s): CKTOTAL, CKMB, CKMBINDEX, TROPONINI in the last 72 hours. BNP Invalid input(s): POCBNP D-Dimer No results for input(s): DDIMER in the last 72 hours.  Hemoglobin A1C No results for input(s): HGBA1C in the last 72 hours. Fasting Lipid Panel No results for input(s): CHOL, HDL, LDLCALC, TRIG, CHOLHDL, LDLDIRECT in the last 72 hours. Thyroid Function Tests No results for input(s): TSH, T4TOTAL, T3FREE, THYROIDAB in the last 72 hours.  Invalid input(s): FREET3  TELE  Atrial fibrillation with controlled ventricular response  ECG    Radiology/Studies  Dg Chest 2 View  12/15/2014   CLINICAL DATA:  Shortness of breath, history of aortic valve replacement an acute and chronic CHF  EXAM: CHEST  2 VIEW  COMPARISON:  Portable chest x-ray of December 14, 2014  FINDINGS: The left lung is well-expanded. There is a small left pleural effusion. On the right there is a moderate-sized effusion with basilar atelectasis. The pulmonary interstitial markings remain mildly increased. The cardiac silhouette remains enlarged. There  is tortuosity of the descending thoracic aorta.  IMPRESSION: COPD with superimposed CHF. Slight interval improvement in the appearance of the pulmonary interstitium has occurred since yesterday's study. There is persistent pleural fluid and basilar atelectasis on the right.   Electronically Signed   By: David  [place]   On: 12/15/2014 08:07   Dg Chest Port 1 View  12/14/2014   CLINICAL DATA:  Shortness of Breath  EXAM: PORTABLE CHEST - 1 VIEW  COMPARISON:  None.  FINDINGS: There is cardiomegaly with pulmonary venous hypertension. There is a right pleural effusion with airspace consolidation in the right base. There is mild interstitial edema. No adenopathy. Bones are osteoporotic. Patient is status post median sternotomy.  IMPRESSION: Evidence of congestive heart failure. Cannot exclude superimposed pneumonia in the right base.   Electronically Signed   By: Bretta Bang III M.D.   On: 12/14/2014 14:37    ASSESSMENT AND PLAN Principal Problem:  Acute on chronic diastolic heart failure Still massively fluid overloaded.  Renal function stable.  Blood pressure is marginally low.  Chronic a-fib Eliquis, bisoprolol 5. Rate controlled.   S/P AVR-(tissue) 2011 at [facility] - no CABG with this  HTN- now with marginally low blood pressure  Hypothyroid Back on synthroid.   Obesity- BMI 45  DM (diabetes mellitus)  Left sided chest pain  Type 2 diabetes mellitus without complication Plan: Continue IV furosemide. Plans for skilled nursing facility in progress We will check a follow-up chest x-ray today.  Signed, Cassell Clement MD

## 2014-12-20 NOTE — Progress Notes (Signed)
[facility] PROGRESS NOTE  Melanie Camacho ZOX:[id number] DOB: 1932-01-25 DOA: 12/14/2014 PCP: PROVIDER NOT IN SYSTEM  Assessment/Plan: #1 acute on chronic diastolic CHF exacerbation Questionable etiology. Cardiac enzymes minimally elevated however seems to have plateaued. BNP was elevated on admission at 1222.9. 2-D echo with EF of 55-60% with no wall motion abnormalities. Aortic valve of the prosthesis was present with normal range of motion no evidence of dehiscence. I/O equal -10.166 L during the hospitalization. Current weight is 105 kg from105.78 from105.098 kg from 104.6 kg from 104.96 kg from 105.23 kg on admission. Weight likely not accurate. Continue current dose of IV Lasix, zebeta, welchol. CXR pending. Cardiology following and appreciate input and recommendations.  #2 atrial fibrillation Continue beta blocker for rate control. Continue eliquis for anticoagulation.  #3 status post AVR 2011 at [facility] 2-D echo with prosthesis present with normal range of motion and no evidence of dehiscence. Per cardiology.  #4 well-controlled diabetes mellitus diet-controlled Hemoglobin A1c is 5.5. CBGs have ranged from 94-119. Sliding scale insulin.  #5 hypothyroidism Patient with prior history of hypothyroidism, however per family patient had been off thyroid medication for approximately 3 years. Patient noted to be volume overloaded with complaints of feeling cold all the time and lethargic. TSH obtained is elevated at 7.734. Free T4 is decreased at 0.75. Free T3 is decreased at 1.6 total T3 was decreased at 66. Continue Synthroid 25 mcg daily. Patient will need repeat thyroid function studies done in about 4-6 weeks as outpatient.  #6 prophylaxis Protonix for GI prophylaxis. Eliquis for anticoagulation.   Code Status: Full Family Communication: Updated patient and daughter at bedside. Disposition Plan: Remain inpatient.   Consultants:  Cardiology: Dr. Allyson Sabal  12/14/2014  Procedures:  Chest x-ray 12/15/2014, 12/14/2014  2-D echo 12/15/2014    Antibiotics:  None  HPI/Subjective: Patient with SOB. Some improvement since admission. No CP.  Objective: Filed Vitals:   12/20/14 0900  BP: 108/58  Pulse: 73  Temp: 98.5 F (36.9 C)  Resp: 23    Intake/Output Summary (Last 24 hours) at 12/20/14 1144 Last data filed at 12/20/14 0947  Gross per 24 hour  Intake    843 ml  Output   1400 ml  Net   -557 ml   Filed Weights   12/18/14 0505 12/19/14 0623 12/20/14 0700  Weight: 105.233 kg (232 lb) 105.788 kg (233 lb 3.5 oz) 105 kg (231 lb 7.7 oz)    Exam:   General:  NAD.   Cardiovascular: Irregularly irregular, positive JVD.  Respiratory: Bibasilar crackles.  Abdomen: Soft, positive bowel sounds, nontender to palpation, lower abdominal wall edema.  Musculoskeletal: No clubbing cyanosis. 2-3+ bilateral lower extremity edema to the hips.  Data Reviewed: Basic Metabolic Panel:  Recent Labs Lab 12/16/14 0400 12/17/14 0320 12/18/14 0637 12/19/14 0538 12/20/14 0527  NA 139 139 139 139 140  K 3.6 3.6 3.8 3.7 3.7  CL 104 106 103 106 101  CO2 GLUCOSE 89 99 71 81 127*  BUN 31* 32* 33* 32* 33*  CREATININE 1.31* 1.21* 1.19* 1.21* 1.21*  CALCIUM 9.0 8.6 8.5 8.4 8.7   Liver Function Tests: No results for input(s): AST, ALT, ALKPHOS, BILITOT, PROT, ALBUMIN in the last 168 hours. No results for input(s): LIPASE, AMYLASE in the last 168 hours. No results for input(s): AMMONIA in the last 168 hours. CBC:  Recent Labs Lab 12/14/14 1410 12/17/14 0320 12/18/14 0637 12/19/14 0538  WBC 4.2 4.9 4.8 4.3  HGB  10.9* 10.4* 11.8* 10.1*  HCT 35.7* 33.4* 36.8 31.4*  MCV 101.7* 101.5* 99.5 99.4  PLT 201 161 145* PLATELET CLUMPS NOTED ON SMEAR, UNABLE TO ESTIMATE   Cardiac Enzymes:  Recent Labs Lab 12/14/14 1616 12/14/14 2150 12/15/14 0414  TROPONINI 0.05* 0.04* 0.04*   BNP (last 3 results)  Recent Labs   12/14/14 1410  BNP 1222.9*    ProBNP (last 3 results) No results for input(s): PROBNP in the last 8760 hours.  CBG:  Recent Labs Lab 12/19/14 0645 12/19/14 1101 12/19/14 1655 12/19/14 2055 12/20/14 0727  GLUCAP 75 95 94 119* 96    Recent Results (from the past 240 hour(s))  MRSA PCR Screening     Status: None   Collection Time: 12/14/14  7:46 PM  Result Value Ref Range Status   MRSA by PCR NEGATIVE NEGATIVE Final    Comment:        The GeneXpert MRSA Assay (FDA approved for NASAL specimens only), is one component of a comprehensive MRSA colonization surveillance program. It is not intended to diagnose MRSA infection nor to guide or monitor treatment for MRSA infections.      Studies: No results found.  Scheduled Meds: . apixaban  5 mg Oral BID  . bisoprolol  5 mg Oral Daily  . colesevelam  1,875 mg Oral BID WC  . docusate sodium  100 mg Oral BID  . furosemide  60 mg Intravenous Q12H  . gabapentin  300 mg Oral BID  . insulin aspart  0-9 Units Subcutaneous TID WC  . levothyroxine  25 mcg Oral QAC breakfast  . multivitamin with minerals  1 tablet Oral Daily  . multivitamin-lutein  2 capsule Oral Daily  . nystatin cream   Topical BID  . pantoprazole  40 mg Oral QHS  . potassium chloride  40 mEq Oral Daily  . sodium chloride  3 mL Intravenous Q12H   Continuous Infusions:   Principal Problem:   Acute on chronic diastolic heart failure Active Problems:   Chronic a-fib   S/P AVR-(tissue) 2011 at [facility] - no CABG with this   HTN- (transient hypotension during recent admission)   Hypothyroid   Obesity- BMI 45   DM (diabetes mellitus)   Left sided chest pain   Type 2 diabetes mellitus without complication    Time spent: 40 minutes    Olita Takeshita M.D. [facility] Pager [phone]. If 7PM-7AM, please contact night-coverage at [email or web address], password [facility] 12/20/2014, 11:44 AM  LOS: 6 days

## 2014-12-21 DIAGNOSIS — E109 Type 1 diabetes mellitus without complications: Secondary | ICD-10-CM

## 2014-12-21 LAB — GLUCOSE, CAPILLARY
GLUCOSE-CAPILLARY: 110 mg/dL — AB (ref 70–99)
GLUCOSE-CAPILLARY: 97 mg/dL (ref 70–99)
Glucose-Capillary: 78 mg/dL (ref 70–99)
Glucose-Capillary: 119 mg/dL — ABNORMAL HIGH (ref 70–99)

## 2014-12-21 LAB — CBC
HCT: 34.1 % — ABNORMAL LOW (ref 36.0–46.0)
Hemoglobin: 10.4 g/dL — ABNORMAL LOW (ref 12.0–15.0)
MCH: 30.7 pg (ref 26.0–34.0)
MCHC: 30.5 g/dL (ref 30.0–36.0)
MCV: 100.6 fL — AB (ref 78.0–100.0)
Platelets: 172 10*3/uL (ref 150–400)
RBC: 3.39 MIL/uL — ABNORMAL LOW (ref 3.87–5.11)
RDW: 16.4 % — AB (ref 11.5–15.5)
WBC: 3.5 10*3/uL — ABNORMAL LOW (ref 4.0–10.5)

## 2014-12-21 LAB — COMPREHENSIVE METABOLIC PANEL
ALK PHOS: 63 U/L (ref 39–117)
ALT: 14 U/L (ref 0–35)
AST: 30 U/L (ref 0–37)
Albumin: 2.7 g/dL — ABNORMAL LOW (ref 3.5–5.2)
Anion gap: 12 (ref 5–15)
BILIRUBIN TOTAL: 0.7 mg/dL (ref 0.3–1.2)
BUN: 36 mg/dL — AB (ref 6–23)
CO2: 24 mmol/L (ref 19–32)
CREATININE: 1.32 mg/dL — AB (ref 0.50–1.10)
Calcium: 8.6 mg/dL (ref 8.4–10.5)
Chloride: 102 mmol/L (ref 96–112)
GFR calc Af Amer: 43 mL/min — ABNORMAL LOW (ref >90)
GFR, EST NON AFRICAN AMERICAN: 37 mL/min — AB (ref >90)
Glucose, Bld: 88 mg/dL (ref 70–99)
Potassium: 4.2 mmol/L (ref 3.5–5.1)
Sodium: 138 mmol/L (ref 135–145)
Total Protein: 6.5 g/dL (ref 6.0–8.3)

## 2014-12-21 NOTE — Progress Notes (Signed)
[facility] PROGRESS NOTE  Melanie Camacho XBJ:[id number] DOB: 1931/12/15 DOA: 12/14/2014 PCP: PROVIDER NOT IN SYSTEM Interim summary: 81 year old lady admitted for SOB. She was being treated for acute on chronic diastolic heart failure.  Assessment/Plan: #1 acute on chronic diastolic CHF exacerbation Questionable etiology. Cardiac enzymes minimally elevated however seems to have plateaued. BNP was elevated on admission at 1222.9. 2-D echo with EF of 55-60% with no wall motion abnormalities. Aortic valve of the prosthesis was present with normal range of motion no evidence of dehiscence.  Continue current dose of IV Lasix, zebeta, welchol. CXR shows bilateral effusions right greater than left. Cardiology following and appreciate input and recommendations.  #2 atrial fibrillation Continue beta blocker for rate control. Continue eliquis for anticoagulation.  #3 status post AVR 2011 at [facility]. 2-D echo with prosthesis present with normal range of motion and no evidence of dehiscence. Per cardiology.  #4 well-controlled diabetes mellitus diet-controlled Hemoglobin A1c is 5.5. CBGs have ranged from 94-119. Sliding scale insulin. CBG (last 3)   Recent Labs  12/20/14 2112 12/21/14 0745 12/21/14 1137  GLUCAP 100* 78 110*      #5 hypothyroidism Patient with prior history of hypothyroidism, however per family patient had been off thyroid medication for approximately 3 years. Patient noted to be volume overloaded with complaints of feeling cold all the time and lethargic. TSH obtained is elevated at 7.734. Free T4 is decreased at 0.75. Free T3 is decreased at 1.6 total T3 was decreased at 66. Continue Synthroid 25 mcg daily. Patient will need repeat thyroid function studies done in about 4-6 weeks as outpatient.  #6 prophylaxis Protonix for GI prophylaxis. Eliquis for anticoagulation.  Mild acute on CKD. : Probably from diuresis. Monitor and replete potassium as needed.     Anemia: Macrocytic. Anemia panel will be sent.    Code Status: Full Family Communication: Updated patient , none at bedside.  Disposition Plan: Remain inpatient.   Consultants:  Cardiology: Dr. Allyson Sabal 12/14/2014  Procedures:  Chest x-ray 12/15/2014, 12/14/2014  2-D echo 12/15/2014    Antibiotics:  None  HPI/Subjective: Sob improved , no chest pain.   Objective: Filed Vitals:   12/21/14 1135  BP: 94/44  Pulse: 101  Temp: 97.5 F (36.4 C)  Resp: 19    Intake/Output Summary (Last 24 hours) at 12/21/14 1526 Last data filed at 12/21/14 0400  Gross per 24 hour  Intake    240 ml  Output   2001 ml  Net  -1761 ml   Filed Weights   12/19/14 0623 12/20/14 0700 12/21/14 0430  Weight: 105.788 kg (233 lb 3.5 oz) 105 kg (231 lb 7.7 oz) 105.008 kg (231 lb 8 oz)    Exam:   General:  NAD.   Cardiovascular: Irregularly irregular, positive JVD.  Respiratory: Bibasilar crackles.  Abdomen: Soft, positive bowel sounds, nontender to palpation, lower abdominal wall edema.  Musculoskeletal: No clubbing cyanosis. 2-3+ bilateral lower extremity edema to the hips.  Data Reviewed: Basic Metabolic Panel:  Recent Labs Lab 12/17/14 0320 12/18/14 3086 12/19/14 0538 12/20/14 0527 12/21/14 0517  NA 139 139 139 140 138  K 3.6 3.8 3.7 3.7 4.2  CL 106 103 106 101 102  CO2 29 26 28 28 24   GLUCOSE 99 71 81 127* 88  BUN 32* 33* 32* 33* 36*  CREATININE 1.21* 1.19* 1.21* 1.21* 1.32*  CALCIUM 8.6 8.5 8.4 8.7 8.6   Liver Function Tests:  Recent Labs Lab 12/21/14 0517  AST 30  ALT  14  ALKPHOS 63  BILITOT 0.7  PROT 6.5  ALBUMIN 2.7*   No results for input(s): LIPASE, AMYLASE in the last 168 hours. No results for input(s): AMMONIA in the last 168 hours. CBC:  Recent Labs Lab 12/17/14 0320 12/18/14 0637 12/19/14 0538 12/21/14 0517  WBC 4.9 4.8 4.3 3.5*  HGB 10.4* 11.8* 10.1* 10.4*  HCT 33.4* 36.8 31.4* 34.1*  MCV 101.5* 99.5 99.4 100.6*  PLT 161 145*  PLATELET CLUMPS NOTED ON SMEAR, UNABLE TO ESTIMATE 172   Cardiac Enzymes:  Recent Labs Lab 12/14/14 1616 12/14/14 2150 12/15/14 0414  TROPONINI 0.05* 0.04* 0.04*   BNP (last 3 results)  Recent Labs  12/14/14 1410  BNP 1222.9*    ProBNP (last 3 results) No results for input(s): PROBNP in the last 8760 hours.  CBG:  Recent Labs Lab 12/20/14 1159 12/20/14 1637 12/20/14 2112 12/21/14 0745 12/21/14 1137  GLUCAP 110* 112* 100* 78 110*    Recent Results (from the past 240 hour(s))  MRSA PCR Screening     Status: None   Collection Time: 12/14/14  7:46 PM  Result Value Ref Range Status   MRSA by PCR NEGATIVE NEGATIVE Final    Comment:        The GeneXpert MRSA Assay (FDA approved for NASAL specimens only), is one component of a comprehensive MRSA colonization surveillance program. It is not intended to diagnose MRSA infection nor to guide or monitor treatment for MRSA infections.      Studies: Dg Chest 2 View  12/20/2014   CLINICAL DATA:  Congestive failure  EXAM: CHEST  2 VIEW  COMPARISON:  12/15/2014  FINDINGS: The cardiac shadow remains enlarged. A right-sided pleural effusion and right basilar atelectasis is again identified. Postsurgical changes are seen. The left lung shows a small effusion but stable. No acute bony abnormality is seen.  IMPRESSION: Bilateral effusions right greater than left. Right basilar atelectasis is noted as well.   Electronically Signed   By: Alcide Clever M.D.   On: 12/20/2014 12:16    Scheduled Meds: . apixaban  5 mg Oral BID  . bisoprolol  5 mg Oral Daily  . colesevelam  1,875 mg Oral BID WC  . docusate sodium  100 mg Oral BID  . furosemide  60 mg Intravenous Q12H  . gabapentin  300 mg Oral BID  . insulin aspart  0-9 Units Subcutaneous TID WC  . levothyroxine  25 mcg Oral QAC breakfast  . multivitamin with minerals  1 tablet Oral Daily  . multivitamin-lutein  2 capsule Oral Daily  . nystatin cream   Topical BID  .  pantoprazole  40 mg Oral QHS  . potassium chloride  40 mEq Oral Daily  . sodium chloride  3 mL Intravenous Q12H   Continuous Infusions:   Principal Problem:   Acute on chronic diastolic heart failure Active Problems:   Chronic a-fib   S/P AVR-(tissue) 2011 at [facility] - no CABG with this   HTN- (transient hypotension during recent admission)   Hypothyroid   Obesity- BMI 45   DM (diabetes mellitus)   Left sided chest pain   Type 2 diabetes mellitus without complication    Time spent: 25 minutes    Klayten Jolliff M.D. [facility] Pager [phone]  If 7PM-7AM, please contact night-coverage at [email or web address], password [facility] 12/21/2014, 3:26 PM  LOS: 7 days

## 2014-12-21 NOTE — Progress Notes (Signed)
Occupational Therapy Treatment Patient Details Name: Melanie Camacho MRN: [id number] DOB: December 16, 1931 Today's Date: 12/21/2014    History of present illness Pt adm with acute on chronic diastolic heart failure. PMH - CAD, NSTEMI, AVR, DM, HTN, CHF, a-fib.   OT comments  Pt seen today for strengthening to promote independence with ADLs and functional mobility. Pt is progressing slowly and demonstrates good leg kicks today, but required +2 Mod A to stand from recliner. Pt is motivated to participate in therapy. Continue to recommend SNF for ST rehab and acute OT will continue to address POC.   Follow Up Recommendations  SNF;Supervision/Assistance - 24 hour    Equipment Recommendations  Tub/shower bench;3 in 1 bedside comode    Recommendations for Other Services      Precautions / Restrictions Precautions Precautions: Fall Precaution Comments: very fearful of falling Restrictions Weight Bearing Restrictions: No       Mobility Bed Mobility Overal bed mobility: Needs Assistance;+2 for physical assistance Bed Mobility: Sit to Supine       Sit to supine: +2 for physical assistance;Max assist   General bed mobility comments: Assist to control descent of trunk and to bring legs up into bed.   Transfers Overall transfer level: Needs assistance Equipment used: 2 person hand held assist Transfers: Sit to/from Stand Sit to Stand: +2 physical assistance;Mod assist         General transfer comment: Pt in low recliner and took 3 attempts to stand. Per pt cues used support under elbow and hand-held assist on each side for successful transfer to standing.        ADL Overall ADL's : Needs assistance/impaired             Lower Body Bathing: Moderate assistance;+2 for physical assistance;Sit to/from stand       Lower Body Dressing: Total assistance;+2 for physical assistance;Sit to/from stand   Toilet Transfer: Moderate assistance;+2 for physical  assistance;Ambulation;RW Toilet Transfer Details (indicate cue type and reason): pt stood from recliner and took 5 steps forward then 5 steps back to return to bed Toileting- Clothing Manipulation and Hygiene: Total assistance;+2 for physical assistance;Sit to/from stand Toileting - Clothing Manipulation Details (indicate cue type and reason): pt unable to remove UEs from RW for pericare or clothing management       General ADL Comments: Pt continues to be fearful of falling, requires increased assist from recliner due to difficulty using UEs on armrests (due to shoulder pain). Pt was motivated to take steps and progressed 5 steps before fatiguing and returned to bed.                 Cognition  Arousal/Alertness: Awake/Alert Behavior During Therapy: WFL for tasks assessed/performed Overall Cognitive Status: Within Functional Limits for tasks assessed                                    Pertinent Vitals/ Pain       Pain Assessment: No/denies pain         Frequency Min 2X/week     Progress Toward Goals  OT Goals(current goals can now be found in the care plan section)  Progress towards OT goals: Progressing toward goals (slowly)  Acute Rehab OT Goals Patient Stated Goal: to walk on my own  Plan Discharge plan remains appropriate       End of Session Equipment Utilized During Treatment: Gait belt;Rolling walker  Activity Tolerance Patient limited by fatigue   Patient Left in bed;with call bell/phone within reach   Nurse Communication          Time: 1448-1530 OT Time Calculation (min): 42 min  Charges: OT General Charges $OT Visit: 1 Procedure OT Treatments $Self Care/Home Management : 8-22 mins $Therapeutic Activity: 23-37 mins  Rae Lips 12/21/2014, 5:20 PM  Carney Living, OTR/L Occupational Therapist [phone] (pager)

## 2014-12-21 NOTE — Progress Notes (Signed)
Subjective: DOE with just getting to the chair.   Objective: Vital signs in last 24 hours: Temp:  [97.3 F (36.3 C)-98.5 F (36.9 C)] 97.7 F (36.5 C) (04/13 0743) Pulse Rate:  [66-109] 109 (04/13 0743) Resp:  [16-27] 16 (04/13 0743) BP: (91-107)/(41-70) 99/41 mmHg (04/13 0743) SpO2:  [92 %-97 %] 92 % (04/13 0743) Weight:  [231 lb 8 oz (105.008 kg)] 231 lb 8 oz (105.008 kg) (04/13 0430) Last BM Date: 12/20/14  Intake/Output from previous day: 04/12 0701 - 04/13 0700 In: 243 [P.O.:240; I.V.:3] Out: 2001 [Urine:2000; Stool:1] Intake/Output this shift:    Medications Current Facility-Administered Medications  Medication Dose Route Frequency Provider Last Rate Last Dose  . 0.9 %  sodium chloride infusion  250 mL Intravenous PRN Stephani Police, PA-C      . acetaminophen (TYLENOL) tablet 650 mg  650 mg Oral Q4H PRN Stephani Police, PA-C   650 mg at 12/21/14 0020  . apixaban (ELIQUIS) tablet 5 mg  5 mg Oral BID Stephani Police, PA-C   5 mg at 12/21/14 0810  . bisoprolol (ZEBETA) tablet 5 mg  5 mg Oral Daily Stephani Police, PA-C   5 mg at 12/21/14 0809  . colesevelam [facility]) tablet 1,875 mg  1,875 mg Oral BID WC Stephani Police, PA-C   1,875 mg at 12/21/14 0810  . cyclobenzaprine (FLEXERIL) tablet 5 mg  5 mg Oral TID PRN Rodolph Bong, MD      . docusate sodium (COLACE) capsule 100 mg  100 mg Oral BID Rodolph Bong, MD   100 mg at 12/20/14 0946  . furosemide (LASIX) injection 60 mg  60 mg Intravenous Q12H Rodolph Bong, MD   60 mg at 12/21/14 0618  . gabapentin (NEURONTIN) capsule 300 mg  300 mg Oral BID Stephani Police, PA-C   300 mg at 12/21/14 0810  . gi cocktail (Maalox,Lidocaine,Donnatal)  30 mL Oral BID PRN Stephani Police, PA-C   30 mL at 12/14/14 1640  . insulin aspart (novoLOG) injection 0-9 Units  0-9 Units Subcutaneous TID WC Rodolph Bong, MD   1 Units at 12/15/14 1735  . levothyroxine (SYNTHROID, LEVOTHROID) tablet 25 mcg  25 mcg Oral QAC breakfast  Rodolph Bong, MD   25 mcg at 12/21/14 [phone]  . multivitamin with minerals tablet 1 tablet  1 tablet Oral Daily Stephani Police, PA-C   1 tablet at 12/21/14 0809  . multivitamin-lutein (OCUVITE-LUTEIN) capsule 2 capsule  2 capsule Oral Daily Stephani Police, PA-C   2 capsule at 12/21/14 3496  . nystatin cream (MYCOSTATIN)   Topical BID Stephani Police, PA-C      . ondansetron [facility]) injection 4 mg  4 mg Intravenous Q6H PRN Stephani Police, PA-C      . oxycodone (OXY-IR) immediate release capsule 5 mg  5 mg Oral Q6H PRN Stephani Police, PA-C      . pantoprazole (PROTONIX) EC tablet 40 mg  40 mg Oral QHS Rodolph Bong, MD   40 mg at 12/20/14 2154  . potassium chloride SA (K-DUR,KLOR-CON) CR tablet 40 mEq  40 mEq Oral Daily Rodolph Bong, MD   40 mEq at 12/21/14 0810  . rOPINIRole (REQUIP) tablet 0.25 mg  0.25 mg Oral TID PRN Laurey Morale, MD   0.25 mg at 12/20/14 2153  . sodium chloride 0.9 % injection 3 mL  3 mL Intravenous Q12H Stephani Police, PA-C  3 mL at 12/20/14 2155  . sodium chloride 0.9 % injection 3 mL  3 mL Intravenous PRN Stephani Police, PA-C        PE: General appearance: alert, cooperative and no distress Lungs: Decreased/absent BS on the right Heart: irregularly irregular rhythm and No MRG Abdomen: +BS. nontender. pitting edema Extremities: tense 3+ pitting LEE Pulses: 2+ radials Skin: Warm, dry Neurologic: Grossly normal  Lab Results:   Recent Labs  12/19/14 0538 12/21/14 0517  WBC 4.3 3.5*  HGB 10.1* 10.4*  HCT 31.4* 34.1*  PLT PLATELET CLUMPS NOTED ON SMEAR, UNABLE TO ESTIMATE 172   BMET  Recent Labs  12/19/14 0538 12/20/14 0527 12/21/14 0517  NA 139 140 138  K 3.7 3.7 4.2  CL 106 101 102  CO2 GLUCOSE 81 127* 88  BUN 32* 33* 36*  CREATININE 1.21* 1.21* 1.32*  CALCIUM 8.4 8.7 8.6   Assessment/Plan  Principal Problem:  Acute on chronic diastolic heart failure Net fluids:  -1.8L/-12.0L.Still massively fluid overloaded.  Renal function stable- Slightly up today. Blood pressure is marginally low.  Continue IV lasix.  Right >left pleural effusion.    Chronic a-fib Eliquis, bisoprolol 5. Rate controlled.   S/P AVR-(tissue) 2011 at [facility] - no CABG with this  HTN- now with marginally low blood pressure  Hypothyroid Back on synthroid.   Obesity- BMI 45  DM (diabetes mellitus)  Left sided chest pain  Type 2 diabetes mellitus without complication   Hypoalbuminemia   Plans for skilled nursing facility in progress.  She has several days before she is ready.     LOS: 7 days    HAGER, BRYAN PA-C 12/21/2014 9:18 AM Patient seen. She feels she is less dyspneic at rest. Exercise capacity is still very limited. Lungs reveal decreased breath sounds at bases. Rhythm is atrial fibrillation with controlled VR. Plan: continue IV lasix. Creatinine up slightly. Search for SNF

## 2014-12-21 NOTE — Progress Notes (Signed)
PT Cancellation Note  Patient Details Name: Melanie Camacho MRN: [id number] DOB: June 21, 1932   Cancelled Treatment:    Reason Eval/Treat Not Completed: Other (comment) (Pt getting bath earler and has just received lunch). Will try again tomorrow.   Haeven Nickle 12/21/2014, 12:26 PM  [facility] PT [phone]

## 2014-12-22 LAB — COMPREHENSIVE METABOLIC PANEL
ALBUMIN: 2.9 g/dL — AB (ref 3.5–5.2)
ALK PHOS: 67 U/L (ref 39–117)
ALT: 13 U/L (ref 0–35)
AST: 28 U/L (ref 0–37)
Anion gap: 10 (ref 5–15)
BILIRUBIN TOTAL: 0.7 mg/dL (ref 0.3–1.2)
BUN: 37 mg/dL — ABNORMAL HIGH (ref 6–23)
CHLORIDE: 101 mmol/L (ref 96–112)
CO2: 29 mmol/L (ref 19–32)
Calcium: 8.9 mg/dL (ref 8.4–10.5)
Creatinine, Ser: 1.27 mg/dL — ABNORMAL HIGH (ref 0.50–1.10)
GFR calc Af Amer: 45 mL/min — ABNORMAL LOW (ref >90)
GFR calc non Af Amer: 39 mL/min — ABNORMAL LOW (ref >90)
Glucose, Bld: 89 mg/dL (ref 70–99)
POTASSIUM: 4.1 mmol/L (ref 3.5–5.1)
Sodium: 140 mmol/L (ref 135–145)
Total Protein: 7 g/dL (ref 6.0–8.3)

## 2014-12-22 LAB — RETICULOCYTES
RBC.: 3.41 MIL/uL — AB (ref 3.87–5.11)
RETIC CT PCT: 2.2 % (ref 0.4–3.1)
Retic Count, Absolute: 75 10*3/uL (ref 19.0–186.0)

## 2014-12-22 LAB — VITAMIN B12: Vitamin B-12: 470 pg/mL (ref 211–911)

## 2014-12-22 LAB — IRON AND TIBC
IRON: 26 ug/dL — AB (ref 42–145)
Saturation Ratios: 7 % — ABNORMAL LOW (ref 20–55)
TIBC: 392 ug/dL (ref 250–470)
UIBC: 366 ug/dL (ref 125–400)

## 2014-12-22 LAB — GLUCOSE, CAPILLARY
GLUCOSE-CAPILLARY: 96 mg/dL (ref 70–99)
Glucose-Capillary: 77 mg/dL (ref 70–99)
Glucose-Capillary: 100 mg/dL — ABNORMAL HIGH (ref 70–99)

## 2014-12-22 LAB — FOLATE: Folate: >20 ng/mL

## 2014-12-22 LAB — FERRITIN: FERRITIN: 60 ng/mL (ref 10–291)

## 2014-12-22 MED ORDER — FUROSEMIDE 10 MG/ML IJ SOLN
80.0000 mg | Freq: Two times a day (BID) | INTRAMUSCULAR | Status: DC
  Administered 2014-12-22 – 2014-12-23 (×3): 80 mg via INTRAVENOUS
  Filled 2014-12-22 (×4): qty 8

## 2014-12-22 MED ORDER — ALPRAZOLAM 0.25 MG PO TABS
0.2500 mg | ORAL_TABLET | Freq: Every evening | ORAL | Status: DC | PRN
  Administered 2014-12-22 – 2014-12-23 (×2): 0.25 mg via ORAL
  Filled 2014-12-22 (×3): qty 1

## 2014-12-22 NOTE — Progress Notes (Signed)
[facility] PROGRESS NOTE  Melanie Camacho VOZ:[id number] DOB: 18-Dec-1931 DOA: 12/14/2014 PCP: PROVIDER NOT IN SYSTEM Interim summary: 81 year old lady admitted for SOB. She was being treated for acute on chronic diastolic heart failure.  Assessment/Plan: #1 acute on chronic diastolic CHF exacerbation Improving. Cardiac enzymes minimally elevated however seems to have plateaued. BNP was elevated on admission at 1222.9. 2-D echo with EF of 55-60% with no wall motion abnormalities. Aortic valve of the prosthesis was present with normal range of motion no evidence of dehiscence.  Continue current dose of IV Lasix, zebeta, welchol. CXR shows bilateral effusions right greater than left. Cardiology following and appreciate input and recommendations. Her net fluid since admission is 12 liters.   #2 atrial fibrillation Continue beta blocker for rate control. Continue eliquis for anticoagulation.  #3 status post AVR 2011 at [facility]. 2-D echo with prosthesis present with normal range of motion and no evidence of dehiscence. Per cardiology.  #4 well-controlled diabetes mellitus diet-controlled Hemoglobin A1c is 5.5. CBGs have ranged from 94-119. Sliding scale insulin. CBG (last 3)   Recent Labs  12/22/14 0733 12/22/14 1122 12/22/14 1656  GLUCAP 77 100* 96      #5 hypothyroidism Patient with prior history of hypothyroidism, however per family patient had been off thyroid medication for approximately 3 years. Patient noted to be volume overloaded with complaints of feeling cold all the time and lethargic. TSH obtained is elevated at 7.734. Free T4 is decreased at 0.75. Free T3 is decreased at 1.6 total T3 was decreased at 66. Continue Synthroid 25 mcg daily. Patient will need repeat thyroid function studies done in about 4-6 weeks as outpatient.  #6 prophylaxis Protonix for GI prophylaxis. Eliquis for anticoagulation.  Mild acute on CKD. : Probably from diuresis. Monitor and replete  potassium as needed.    Anemia: Macrocytic. Anemia panel will be sent. Showed b12 levels of 470 and normal folate levels. Low iron levels. Iron supplements on discharge    Code Status: Full Family Communication: Updated patient , none at bedside.  Disposition Plan: Remain inpatient.   Consultants:  Cardiology: Dr. Allyson Sabal 12/14/2014  Procedures:  Chest x-ray 12/15/2014, 12/14/2014  2-D echo 12/15/2014    Antibiotics:  None  HPI/Subjective: Sob improved , no chest pain. Sitting in chair.   Objective: Filed Vitals:   12/22/14 1533  BP: 109/56  Pulse: 78  Temp: 97.5 F (36.4 C)  Resp: 17    Intake/Output Summary (Last 24 hours) at 12/22/14 1844 Last data filed at 12/22/14 1802  Gross per 24 hour  Intake   1443 ml  Output   1050 ml  Net    393 ml   Filed Weights   12/20/14 0700 12/21/14 0430 12/22/14 0400  Weight: 105 kg (231 lb 7.7 oz) 105.008 kg (231 lb 8 oz) 105.552 kg (232 lb 11.2 oz)    Exam:   General:  NAD.   Cardiovascular: Irregularly irregular, positive JVD.  Respiratory: Bibasilar crackles.  Abdomen: Soft, positive bowel sounds, nontender to palpation, lower abdominal wall edema.  Musculoskeletal: No clubbing cyanosis. 2-3+ bilateral lower extremity edema to the hips.  Data Reviewed: Basic Metabolic Panel:  Recent Labs Lab 12/18/14 0637 12/19/14 0538 12/20/14 0527 12/21/14 0517 12/22/14 0607  NA 139 139 140 138 140  K 3.8 3.7 3.7 4.2 4.1  CL 103 106 101 102 101  CO2 GLUCOSE 71 81 127* 88 89  BUN 33* 32* 33* 36* 37*  CREATININE  1.19* 1.21* 1.21* 1.32* 1.27*  CALCIUM 8.5 8.4 8.7 8.6 8.9   Liver Function Tests:  Recent Labs Lab 12/21/14 0517 12/22/14 0607  AST 30 28  ALT 14 13  ALKPHOS 63 67  BILITOT 0.7 0.7  PROT 6.5 7.0  ALBUMIN 2.7* 2.9*   No results for input(s): LIPASE, AMYLASE in the last 168 hours. No results for input(s): AMMONIA in the last 168 hours. CBC:  Recent Labs Lab 12/17/14 0320  12/18/14 0637 12/19/14 0538 12/21/14 0517  WBC 4.9 4.8 4.3 3.5*  HGB 10.4* 11.8* 10.1* 10.4*  HCT 33.4* 36.8 31.4* 34.1*  MCV 101.5* 99.5 99.4 100.6*  PLT 161 145* PLATELET CLUMPS NOTED ON SMEAR, UNABLE TO ESTIMATE 172   Cardiac Enzymes: No results for input(s): CKTOTAL, CKMB, CKMBINDEX, TROPONINI in the last 168 hours. BNP (last 3 results)  Recent Labs  12/14/14 1410  BNP 1222.9*    ProBNP (last 3 results) No results for input(s): PROBNP in the last 8760 hours.  CBG:  Recent Labs Lab 12/21/14 1659 12/21/14 2112 12/22/14 0733 12/22/14 1122 12/22/14 1656  GLUCAP 97 119* 77 100* 96    Recent Results (from the past 240 hour(s))  MRSA PCR Screening     Status: None   Collection Time: 12/14/14  7:46 PM  Result Value Ref Range Status   MRSA by PCR NEGATIVE NEGATIVE Final    Comment:        The GeneXpert MRSA Assay (FDA approved for NASAL specimens only), is one component of a comprehensive MRSA colonization surveillance program. It is not intended to diagnose MRSA infection nor to guide or monitor treatment for MRSA infections.      Studies: No results found.  Scheduled Meds: . apixaban  5 mg Oral BID  . bisoprolol  5 mg Oral Daily  . colesevelam  1,875 mg Oral BID WC  . docusate sodium  100 mg Oral BID  . furosemide  80 mg Intravenous Q12H  . gabapentin  300 mg Oral BID  . insulin aspart  0-9 Units Subcutaneous TID WC  . levothyroxine  25 mcg Oral QAC breakfast  . multivitamin with minerals  1 tablet Oral Daily  . multivitamin-lutein  2 capsule Oral Daily  . nystatin cream   Topical BID  . pantoprazole  40 mg Oral QHS  . potassium chloride  40 mEq Oral Daily  . sodium chloride  3 mL Intravenous Q12H   Continuous Infusions:   Principal Problem:   Acute on chronic diastolic heart failure Active Problems:   Chronic a-fib   S/P AVR-(tissue) 2011 at [facility] - no CABG with this   HTN- (transient hypotension during recent admission)   Hypothyroid    Obesity- BMI 45   DM (diabetes mellitus)   Left sided chest pain   Type 2 diabetes mellitus without complication    Time spent: 25 minutes    Hanako Tipping M.D. [facility] Pager [phone]  If 7PM-7AM, please contact night-coverage at [email or web address], password [facility] 12/22/2014, 6:44 PM  LOS: 8 days

## 2014-12-22 NOTE — Progress Notes (Signed)
Subjective: She thinks she feels better, less SOB. Her main complaint is restlessness at night.   Objective: Vital signs in last 24 hours: Temp:  [97.5 F (36.4 C)-97.8 F (36.6 C)] 97.6 F (36.4 C) (04/14 0734) Pulse Rate:  [78-113] 94 (04/14 0734) Resp:  [18-19] 18 (04/14 0734) BP: (94-113)/(44-73) 98/73 mmHg (04/14 0734) SpO2:  [91 %-98 %] 98 % (04/14 0734) Weight:  [232 lb 11.2 oz (105.552 kg)] 232 lb 11.2 oz (105.552 kg) (04/14 0400) Last BM Date: 12/21/14  Intake/Output from previous day: 04/13 0701 - 04/14 0700 In: 1080 [P.O.:1080] Out: 2050 [Urine:2050] Intake/Output this shift: Total I/O In: 240 [P.O.:240] Out: -   Medications Current Facility-Administered Medications  Medication Dose Route Frequency Provider Last Rate Last Dose  . 0.9 %  sodium chloride infusion  250 mL Intravenous PRN Stephani Police, PA-C      . acetaminophen (TYLENOL) tablet 650 mg  650 mg Oral Q4H PRN Stephani Police, PA-C   650 mg at 12/21/14 0020  . apixaban (ELIQUIS) tablet 5 mg  5 mg Oral BID Stephani Police, PA-C   5 mg at 12/22/14 0815  . bisoprolol (ZEBETA) tablet 5 mg  5 mg Oral Daily Stephani Police, PA-C   5 mg at 12/22/14 0816  . colesevelam [facility]) tablet 1,875 mg  1,875 mg Oral BID WC Stephani Police, PA-C   1,875 mg at 12/22/14 0815  . cyclobenzaprine (FLEXERIL) tablet 5 mg  5 mg Oral TID PRN Rodolph Bong, MD      . docusate sodium (COLACE) capsule 100 mg  100 mg Oral BID Rodolph Bong, MD   100 mg at 12/22/14 0815  . furosemide (LASIX) injection 60 mg  60 mg Intravenous Q12H Rodolph Bong, MD   60 mg at 12/22/14 0654  . gabapentin (NEURONTIN) capsule 300 mg  300 mg Oral BID Stephani Police, PA-C   300 mg at 12/22/14 0816  . gi cocktail (Maalox,Lidocaine,Donnatal)  30 mL Oral BID PRN Stephani Police, PA-C   30 mL at 12/14/14 1640  . insulin aspart (novoLOG) injection 0-9 Units  0-9 Units Subcutaneous TID WC Rodolph Bong, MD   1 Units at 12/15/14 1735  .  levothyroxine (SYNTHROID, LEVOTHROID) tablet 25 mcg  25 mcg Oral QAC breakfast Rodolph Bong, MD   25 mcg at 12/22/14 [phone]  . multivitamin with minerals tablet 1 tablet  1 tablet Oral Daily Stephani Police, PA-C   1 tablet at 12/22/14 9604  . multivitamin-lutein (OCUVITE-LUTEIN) capsule 2 capsule  2 capsule Oral Daily Stephani Police, PA-C   2 capsule at 12/21/14 5409  . nystatin cream (MYCOSTATIN)   Topical BID Stephani Police, PA-C      . ondansetron [facility]) injection 4 mg  4 mg Intravenous Q6H PRN Stephani Police, PA-C      . oxycodone (OXY-IR) immediate release capsule 5 mg  5 mg Oral Q6H PRN Stephani Police, PA-C      . pantoprazole (PROTONIX) EC tablet 40 mg  40 mg Oral QHS Rodolph Bong, MD   40 mg at 12/21/14 2220  . potassium chloride SA (K-DUR,KLOR-CON) CR tablet 40 mEq  40 mEq Oral Daily Rodolph Bong, MD   40 mEq at 12/22/14 0815  . rOPINIRole (REQUIP) tablet 0.25 mg  0.25 mg Oral TID PRN Laurey Morale, MD   0.25 mg at 12/21/14 2220  . sodium chloride 0.9 % injection 3  mL  3 mL Intravenous Q12H Stephani Police, PA-C   3 mL at 12/21/14 2222  . sodium chloride 0.9 % injection 3 mL  3 mL Intravenous PRN Stephani Police, PA-C        PE: General appearance: alert, cooperative and no distress Lungs: Crackles BS at the right base Heart: irregularly irregular rhythm  Abdomen: +BS. nontender. pitting edema Extremities: tense 2+ pitting LEE Skin: Warm, dry Neurologic: Grossly normal  Lab Results:   Recent Labs  12/21/14 0517  WBC 3.5*  HGB 10.4*  HCT 34.1*  PLT 172   BMET  Recent Labs  12/20/14 0527 12/21/14 0517 12/22/14 0607  NA 140 138 140  K 3.7 4.2 4.1  CL 101 102 101  CO2 28 24 29   GLUCOSE 127* 88 89  BUN 33* 36* 37*  CREATININE 1.21* 1.32* 1.27*  CALCIUM 8.7 8.6 8.9   Assessment/Plan  Principal Problem:  Acute on chronic diastolic heart failure   I/o neg 12.6 L since adm Right >left pleural effusion.    Chronic a-fib Eliquis,  bisoprolol 5. Rate controlled.   S/P AVR-(tissue) 2011 at [facility]   HTN- now with marginally low blood pressure  Hypothyroid Back on synthroid.   Obesity- BMI 45   Type 2 diabetes mellitus without complication   Hypoalbuminemia    Chronic renal insufficiency-stage 3  Plan is for SNF. On IV Lasix but I/O appears equal last 24.     LOS: 8 days    Plan: Continue IV diuresis. SNF- ? Early next week.   Corine Shelter K PA-C 12/22/2014 9:26 AM

## 2014-12-22 NOTE — Progress Notes (Signed)
Patient seen with Corine Shelter PA-C. Please see his progress note from today. Her creatinine is slightly better today. Diuresis and weight loss are tapering off. Still very fluid overloaded. Will try increasing Lasix to 80 mg IV BID

## 2014-12-22 NOTE — Plan of Care (Signed)
Problem: Phase II Progression Outcomes Goal: Walk in hall or up in chair TID Outcome: Progressing Pt up in chair most of the day

## 2014-12-22 NOTE — Progress Notes (Signed)
Physical Therapy Treatment Patient Details Name: Melanie Camacho MRN: [id number] DOB: 08-28-1932 Today's Date: Jan 19, 2015    History of Present Illness Pt adm with acute on chronic diastolic heart failure. PMH - CAD, NSTEMI, AVR, DM, HTN, CHF, a-fib.    PT Comments    Pt making slow progress.  Follow Up Recommendations  SNF     Equipment Recommendations  None recommended by PT    Recommendations for Other Services       Precautions / Restrictions Precautions Precautions: Fall Precaution Comments: very fearful of falling    Mobility  Bed Mobility                  Transfers Overall transfer level: Needs assistance Equipment used: 2 person hand held assist Transfers: Sit to/from Stand Sit to Stand: +2 physical assistance;Mod assist         General transfer comment: Pt in low recliner. Per pt cues used support under elbow and hand-held assist on each side for successful transfer to standing.  Ambulation/Gait Ambulation/Gait assistance: Min assist;+2 safety/equipment Ambulation Distance (Feet): 20 Feet Assistive device: Rolling walker (2 wheeled) Gait Pattern/deviations: Step-to pattern;Decreased step length - right;Decreased step length - left;Trunk flexed Gait velocity: decr Gait velocity interpretation: Below normal speed for age/gender General Gait Details: Assist for balance and safety. Cues to stay closer to walker.  Knees and shoulders with severe crepitus with amb.   Stairs            Wheelchair Mobility    Modified Rankin (Stroke Patients Only)       Balance Overall balance assessment: Needs assistance Sitting-balance support: No upper extremity supported;Feet supported Sitting balance-Leahy Scale: Fair     Standing balance support: Bilateral upper extremity supported Standing balance-Leahy Scale: Poor Standing balance comment: support of walker and min A                    Cognition Arousal/Alertness: Awake/alert Behavior  During Therapy: WFL for tasks assessed/performed Overall Cognitive Status: Within Functional Limits for tasks assessed                      Exercises      General Comments        Pertinent Vitals/Pain Pain Assessment: No/denies pain    Home Living                      Prior Function            PT Goals (current goals can now be found in the care plan section) Progress towards PT goals: Progressing toward goals    Frequency  Min 2X/week    PT Plan Current plan remains appropriate    Co-evaluation             End of Session Equipment Utilized During Treatment: Gait belt Activity Tolerance: Patient tolerated treatment well Patient left: in bed;with call bell/phone within reach     Time: 1031-1053 PT Time Calculation (min) (ACUTE ONLY): 22 min  Charges:  $Gait Training: 8-22 mins                    G Codes:      Deven Furia Jan 19, 2015, 1:16 PM  [facility] PT [phone]

## 2014-12-23 DIAGNOSIS — I509 Heart failure, unspecified: Secondary | ICD-10-CM

## 2014-12-23 LAB — BASIC METABOLIC PANEL
Anion gap: 10 (ref 5–15)
BUN: 35 mg/dL — AB (ref 6–23)
CO2: 28 mmol/L (ref 19–32)
CREATININE: 1.27 mg/dL — AB (ref 0.50–1.10)
Calcium: 8.6 mg/dL (ref 8.4–10.5)
Chloride: 102 mmol/L (ref 96–112)
GFR calc Af Amer: 45 mL/min — ABNORMAL LOW (ref >90)
GFR, EST NON AFRICAN AMERICAN: 39 mL/min — AB (ref >90)
Glucose, Bld: 85 mg/dL (ref 70–99)
POTASSIUM: 3.8 mmol/L (ref 3.5–5.1)
Sodium: 140 mmol/L (ref 135–145)

## 2014-12-23 LAB — GLUCOSE, CAPILLARY: Glucose-Capillary: 111 mg/dL — ABNORMAL HIGH (ref 70–99)

## 2014-12-23 MED ORDER — POTASSIUM CHLORIDE CRYS ER 20 MEQ PO TBCR
40.0000 meq | EXTENDED_RELEASE_TABLET | Freq: Once | ORAL | Status: AC
  Administered 2014-12-23: 40 meq via ORAL
  Filled 2014-12-23: qty 2

## 2014-12-23 NOTE — Progress Notes (Signed)
[facility] PROGRESS NOTE  Melanie Camacho ZOX:[id number] DOB: May 22, 1932 DOA: 12/14/2014 PCP: PROVIDER NOT IN SYSTEM Interim summary: 81 year old lady admitted for SOB. She was being treated for acute on chronic diastolic heart failure.  Assessment/Plan: #1 acute on chronic diastolic CHF exacerbation Improving. Cardiac enzymes minimally elevated however seems to have plateaued. BNP was elevated on admission at 1222.9. 2-D echo with EF of 55-60% with no wall motion abnormalities. Aortic valve of the prosthesis was present with normal range of motion no evidence of dehiscence.  Continue current dose of IV Lasix, zebeta, welchol.  Cardiology following and appreciate input and recommendations. Her net fluid since admission is -14 liters.   #2 atrial fibrillation Continue beta blocker for rate control. Continue eliquis for anticoagulation.  #3 status post AVR 2011 at [facility]. 2-D echo with prosthesis present with normal range of motion and no evidence of dehiscence. Per cardiology.  #4 well-controlled diabetes mellitus diet-controlled Hemoglobin A1c is 5.5. CBGs have ranged from 94-119. Sliding scale insulin. CBG (last 3)   Recent Labs  12/22/14 0733 12/22/14 1122 12/22/14 1656  GLUCAP 77 100* 96      #5 hypothyroidism Patient with prior history of hypothyroidism, however per family patient had been off thyroid medication for approximately 3 years. Patient noted to be volume overloaded with complaints of feeling cold all the time and lethargic. TSH obtained is elevated at 7.734. Free T4 is decreased at 0.75. Free T3 is decreased at 1.6 total T3 was decreased at 66. Continue Synthroid 25 mcg daily. Patient will need repeat thyroid function studies done in about 4-6 weeks as outpatient.  #6 prophylaxis Protonix for GI prophylaxis. Eliquis for anticoagulation.  Mild acute on CKD. : Probably from diuresis. Monitor and replete potassium as needed.    Anemia: Macrocytic. Anemia  panel will be sent. Showed b12 levels of 470 and normal folate levels. Low iron levels. Iron supplements on discharge    Code Status: Full Family Communication: Updated patient , none at bedside.  Disposition Plan: Remain inpatient.   Consultants:  Cardiology: Dr. Allyson Sabal 12/14/2014  Procedures:  Chest x-ray 12/15/2014, 12/14/2014  2-D echo 12/15/2014    Antibiotics:  None  HPI/Subjective: Sob improved , denies any new complaints, sleeping in the bed.   Objective: Filed Vitals:   12/23/14 0822  BP: 106/72  Pulse: 99  Temp: 97.4 F (36.3 C)  Resp: 18    Intake/Output Summary (Last 24 hours) at 12/23/14 1050 Last data filed at 12/23/14 0500  Gross per 24 hour  Intake   1083 ml  Output   2500 ml  Net  -1417 ml   Filed Weights   12/21/14 0430 12/22/14 0400 12/23/14 0445  Weight: 105.008 kg (231 lb 8 oz) 105.552 kg (232 lb 11.2 oz) 105.371 kg (232 lb 4.8 oz)    Exam:   General:  NAD.   Cardiovascular: Irregularly irregular,  Respiratory: air entry fair, no wheezing or rhonchi, no rales hears  Abdomen: Soft, positive bowel sounds, nontender to palpation, lower abdominal wall edema.  Musculoskeletal: No clubbing cyanosis. 2 + edema upto the knees, scabs over the legs.   Data Reviewed: Basic Metabolic Panel:  Recent Labs Lab 12/19/14 0538 12/20/14 0527 12/21/14 0517 12/22/14 0607 12/23/14 0436  NA 139 140 138 140 140  K 3.7 3.7 4.2 4.1 3.8  CL 106 101 102 101 102  CO2 GLUCOSE 81 127* 88 89 85  BUN 32* 33* 36* 37* 35*  CREATININE 1.21* 1.21* 1.32* 1.27* 1.27*  CALCIUM 8.4 8.7 8.6 8.9 8.6   Liver Function Tests:  Recent Labs Lab 12/21/14 0517 12/22/14 0607  AST 30 28  ALT 14 13  ALKPHOS 63 67  BILITOT 0.7 0.7  PROT 6.5 7.0  ALBUMIN 2.7* 2.9*   No results for input(s): LIPASE, AMYLASE in the last 168 hours. No results for input(s): AMMONIA in the last 168 hours. CBC:  Recent Labs Lab 12/17/14 0320 12/18/14 0637  12/19/14 0538 12/21/14 0517  WBC 4.9 4.8 4.3 3.5*  HGB 10.4* 11.8* 10.1* 10.4*  HCT 33.4* 36.8 31.4* 34.1*  MCV 101.5* 99.5 99.4 100.6*  PLT 161 145* PLATELET CLUMPS NOTED ON SMEAR, UNABLE TO ESTIMATE 172   Cardiac Enzymes: No results for input(s): CKTOTAL, CKMB, CKMBINDEX, TROPONINI in the last 168 hours. BNP (last 3 results)  Recent Labs  12/14/14 1410  BNP 1222.9*    ProBNP (last 3 results) No results for input(s): PROBNP in the last 8760 hours.  CBG:  Recent Labs Lab 12/21/14 1659 12/21/14 2112 12/22/14 0733 12/22/14 1122 12/22/14 1656  GLUCAP 97 119* 77 100* 96    Recent Results (from the past 240 hour(s))  MRSA PCR Screening     Status: None   Collection Time: 12/14/14  7:46 PM  Result Value Ref Range Status   MRSA by PCR NEGATIVE NEGATIVE Final    Comment:        The GeneXpert MRSA Assay (FDA approved for NASAL specimens only), is one component of a comprehensive MRSA colonization surveillance program. It is not intended to diagnose MRSA infection nor to guide or monitor treatment for MRSA infections.      Studies: No results found.  Scheduled Meds: . apixaban  5 mg Oral BID  . bisoprolol  5 mg Oral Daily  . colesevelam  1,875 mg Oral BID WC  . docusate sodium  100 mg Oral BID  . furosemide  80 mg Intravenous Q12H  . gabapentin  300 mg Oral BID  . insulin aspart  0-9 Units Subcutaneous TID WC  . levothyroxine  25 mcg Oral QAC breakfast  . multivitamin with minerals  1 tablet Oral Daily  . multivitamin-lutein  2 capsule Oral Daily  . nystatin cream   Topical BID  . pantoprazole  40 mg Oral QHS  . potassium chloride  40 mEq Oral Daily  . potassium chloride  40 mEq Oral Once  . sodium chloride  3 mL Intravenous Q12H   Continuous Infusions:   Principal Problem:   Acute on chronic diastolic heart failure Active Problems:   Chronic a-fib   S/P AVR-(tissue) 2011 at [facility] - no CABG with this   HTN- (transient hypotension during recent  admission)   Hypothyroid   Obesity- BMI 45   DM (diabetes mellitus)   Left sided chest pain   Type 2 diabetes mellitus without complication    Time spent: 25 minutes    Cyndee Giammarco M.D. [facility] Pager [phone]  If 7PM-7AM, please contact night-coverage at [email or web address], password [facility] 12/23/2014, 10:50 AM  LOS: 9 days

## 2014-12-23 NOTE — Progress Notes (Signed)
CSW Proofreader) spoke with pt and later pt daughter over the phone to provide bed offers. Both pt and pt daughter confirmed they would like to accept bed offer from [facility]. CSW notified facility. Facility confirmed they can accept pt over weekend if pt is medically stable for discharge. Weekend CSW will need to call nursing station number [phone] to notify.   Yarima Penman, LCSWA [phone]

## 2014-12-23 NOTE — Progress Notes (Signed)
Subjective: Breathing better.  Slept in the bed last night.   Objective: Vital signs in last 24 hours: Temp:  [97.4 F (36.3 C)-98 F (36.7 C)] 97.4 F (36.3 C) (04/15 0822) Pulse Rate:  [66-109] 99 (04/15 0822) Resp:  [17-20] 18 (04/15 0822) BP: (96-109)/(48-72) 106/72 mmHg (04/15 0822) SpO2:  [91 %-98 %] 94 % (04/15 0822) Weight:  [232 lb 4.8 oz (105.371 kg)] 232 lb 4.8 oz (105.371 kg) (04/15 0445) Last BM Date: 12/21/14  Intake/Output from previous day: 04/14 0701 - 04/15 0700 In: 1326 [P.O.:1320; I.V.:6] Out: 2500 [Urine:2500] Intake/Output this shift:    Medications Current Facility-Administered Medications  Medication Dose Route Frequency Provider Last Rate Last Dose  . 0.9 %  sodium chloride infusion  250 mL Intravenous PRN Stephani Police, PA-C      . acetaminophen (TYLENOL) tablet 650 mg  650 mg Oral Q4H PRN Stephani Police, PA-C   650 mg at 12/21/14 0020  . ALPRAZolam [facility] Feeler) tablet 0.25 mg  0.25 mg Oral QHS PRN Abelino Derrick, PA-C   0.25 mg at 12/22/14 2202  . apixaban (ELIQUIS) tablet 5 mg  5 mg Oral BID Stephani Police, PA-C   5 mg at 12/22/14 2202  . bisoprolol (ZEBETA) tablet 5 mg  5 mg Oral Daily Stephani Police, PA-C   5 mg at 12/22/14 0816  . colesevelam [facility] tablet 1,875 mg  1,875 mg Oral BID WC Stephani Police, PA-C   1,875 mg at 12/22/14 1755  . cyclobenzaprine (FLEXERIL) tablet 5 mg  5 mg Oral TID PRN Rodolph Bong, MD      . docusate sodium (COLACE) capsule 100 mg  100 mg Oral BID Rodolph Bong, MD   100 mg at 12/22/14 0815  . furosemide (LASIX) injection 80 mg  80 mg Intravenous Q12H Cassell Clement, MD   80 mg at 12/23/14 0553  . gabapentin (NEURONTIN) capsule 300 mg  300 mg Oral BID Stephani Police, PA-C   300 mg at 12/22/14 2202  . gi cocktail (Maalox,Lidocaine,Donnatal)  30 mL Oral BID PRN Stephani Police, PA-C   30 mL at 12/14/14 1640  . insulin aspart (novoLOG) injection 0-9 Units  0-9 Units Subcutaneous TID WC Rodolph Bong,  MD   1 Units at 12/15/14 1735  . levothyroxine (SYNTHROID, LEVOTHROID) tablet 25 mcg  25 mcg Oral QAC breakfast Rodolph Bong, MD   25 mcg at 12/22/14 [phone]  . multivitamin with minerals tablet 1 tablet  1 tablet Oral Daily Stephani Police, PA-C   1 tablet at 12/22/14 4742  . multivitamin-lutein (OCUVITE-LUTEIN) capsule 2 capsule  2 capsule Oral Daily Stephani Police, PA-C   2 capsule at 12/22/14 1000  . nystatin cream (MYCOSTATIN)   Topical BID Stephani Police, PA-C      . ondansetron [facility]) injection 4 mg  4 mg Intravenous Q6H PRN Stephani Police, PA-C      . oxycodone (OXY-IR) immediate release capsule 5 mg  5 mg Oral Q6H PRN Stephani Police, PA-C      . pantoprazole (PROTONIX) EC tablet 40 mg  40 mg Oral QHS Rodolph Bong, MD   40 mg at 12/22/14 2202  . potassium chloride SA (K-DUR,KLOR-CON) CR tablet 40 mEq  40 mEq Oral Daily Rodolph Bong, MD   40 mEq at 12/22/14 0815  . rOPINIRole (REQUIP) tablet 0.25 mg  0.25 mg Oral TID PRN Laurey Morale, MD  0.25 mg at 12/21/14 2220  . sodium chloride 0.9 % injection 3 mL  3 mL Intravenous Q12H Stephani Police, PA-C   3 mL at 12/22/14 2203  . sodium chloride 0.9 % injection 3 mL  3 mL Intravenous PRN Stephani Police, PA-C        PE: General appearance: alert, cooperative and no distress Lungs: rales on the right.  -wheeze Heart: irregularly irregular rhythm Extremities: 2+ LEE. LEgs softer than the last time I saw her which was two days ago Pulses: 2+ and symmetric Skin: warm and dry Neurologic: Grossly normal  Lab Results:   Recent Labs  12/21/14 0517  WBC 3.5*  HGB 10.4*  HCT 34.1*  PLT 172   BMET  Recent Labs  12/21/14 0517 12/22/14 0607 12/23/14 0436  NA 138 140 140  K 4.2 4.1 3.8  CL 102 101 102  CO2 GLUCOSE 88 89 85  BUN 36* 37* 35*  CREATININE 1.32* 1.27* 1.27*  CALCIUM 8.6 8.9 8.6      Assessment/Plan   Principal Problem:   Acute on chronic diastolic heart failure Active Problems:    Chronic a-fib   S/P AVR-(tissue) 2011 at [facility] - no CABG with this   HTN- (transient hypotension during recent admission)   Hypothyroid   Obesity- BMI 45   DM (diabetes mellitus)   Left sided chest pain   Type 2 diabetes mellitus without complication  Net fluids: -1.2L/-14L.  IV lasix  BID.  SCr stable.  K+ WNL but trending down.  Will give an extra this morning.  She is anxious to leave and go to SNF for rehab.  She is looking at [place], [place] because it is close to her daughter.  She is certainly making progress.  Legs are a little softer and she is now moving air in her right lung.  I Think she still thnk she has a ways to go and should not leave yet.    LOS: 9 days    HAGER, BRYAN PA-C 12/23/2014 8:44 AM Agree with above. Patient diuresed better yesterday on higher dose of IV lasix. Will continue same dose today and  Consider switch to oral tomorrow. Anticipate she will be ready to go to [facility] Monday.

## 2014-12-23 NOTE — Progress Notes (Signed)
Occupational Therapy Treatment Patient Details Name: Melanie Camacho MRN: [id number] DOB: Feb 06, 1932 Today's Date: 12/23/2014    History of present illness Pt adm with acute on chronic diastolic heart failure. PMH - CAD, NSTEMI, AVR, DM, HTN, CHF, a-fib.   OT comments  Pt seen today to address activity tolerance and functional mobility. Pt was highly motivated and able to ambulate to doorway and back without rest break. Pt continues to require +2 Mod A to stand due to UE pain and impairment. Acute OT to continue with POC.    Follow Up Recommendations  SNF;Supervision/Assistance - 24 hour    Equipment Recommendations  Tub/shower bench;3 in 1 bedside comode    Recommendations for Other Services      Precautions / Restrictions Precautions Precautions: Fall Precaution Comments: very fearful of falling Restrictions Weight Bearing Restrictions: No       Mobility Bed Mobility               General bed mobility comments: Pt content sitting in chair before/after therapy.   Transfers Overall transfer level: Needs assistance Equipment used: 2 person hand held assist Transfers: Sit to/from Stand Sit to Stand: +2 physical assistance;Mod assist         General transfer comment: Pt prefers hand held assist technique due to shoulder pain. Pt requires mod A +2 to stand and maintain balance. Pt briefly fearful of falling, however gained balance with assist then placed hands on shorter youth RW. Min guard once standing and ambulating. Pt sits with only min A and is able to slowly descend to seat.     Balance Overall balance assessment: Needs assistance Sitting-balance support: No upper extremity supported;Feet supported Sitting balance-Leahy Scale: Fair Sitting balance - Comments: fair static sitting; does not move outside BOS for dynamic sitting   Standing balance support: Bilateral upper extremity supported;During functional activity Standing balance-Leahy Scale: Poor Standing  balance comment: requires UE support on RW                   ADL Overall ADL's : Needs assistance/impaired             Lower Body Bathing: Moderate assistance;+2 for physical assistance;Sit to/from stand       Lower Body Dressing: Total assistance;+2 for physical assistance;Sit to/from stand   Toilet Transfer: +2 for physical assistance;Moderate assistance;Ambulation;RW Toilet Transfer Details (indicate cue type and reason): pt stood from recliner with +2 mod A         Functional mobility during ADLs: Min guard;Rolling walker General ADL Comments: Pt requires +2 Mod A to stand from recliner, however was able to ambulate from windows to door to room and back to recliner chair without seated rest break. VC's to stand up tall. Pt with audible crepitus in shoulders when she hunches over and relies heavily on RW, however much improved when standing tall and using legs more.                 Cognition   Arousal/alertness: Awake/Alert  Behavior During Therapy: WFL for tasks assessed/performed Overall Cognitive Status: Within Functional Limits for tasks assessed                                    Pertinent Vitals/ Pain       Pain Assessment: No/denies pain         Frequency Min 2X/week     Progress Toward Goals  OT Goals(current goals can now be found in the care plan section)  Progress towards OT goals: Progressing toward goals  Acute Rehab OT Goals Patient Stated Goal: to walk on my own  Plan Discharge plan remains appropriate       End of Session Equipment Utilized During Treatment: Gait belt;Rolling walker   Activity Tolerance Patient tolerated treatment well   Patient Left in chair;with call bell/phone within reach   Nurse Communication          Time: 1610-9604 OT Time Calculation (min): 44 min  Charges: OT General Charges $OT Visit: 1 Procedure OT Treatments $Self Care/Home Management : 8-22 mins $Therapeutic Activity: 23-37  mins  Rae Lips 12/23/2014, 5:45 PM  Carney Living, OTR/L Occupational Therapist [phone] (pager)

## 2014-12-23 NOTE — Progress Notes (Signed)
I stopped back in to see Melanie Camacho.  She tells me that she plans to go to SNF to get a bit "stronger".  She says that compliance with HF recommendations may be difficult once she returns to her daughter's home.--(as they like to eat out and do not always eat low sodium foods)--see my previous note.  I told her that I will attempt to contact her daughter to educate her as well on the importance of a low sodium diet for her mother and other HF recommendations. She was appreciative and tells me that she has no specific HF related questions at this time.

## 2014-12-24 LAB — BASIC METABOLIC PANEL
Anion gap: 10 (ref 5–15)
BUN: 37 mg/dL — AB (ref 6–23)
CHLORIDE: 101 mmol/L (ref 96–112)
CO2: 30 mmol/L (ref 19–32)
Calcium: 8.9 mg/dL (ref 8.4–10.5)
Creatinine, Ser: 1.3 mg/dL — ABNORMAL HIGH (ref 0.50–1.10)
GFR, EST AFRICAN AMERICAN: 44 mL/min — AB (ref >90)
GFR, EST NON AFRICAN AMERICAN: 38 mL/min — AB (ref >90)
Glucose, Bld: 79 mg/dL (ref 70–99)
POTASSIUM: 4.3 mmol/L (ref 3.5–5.1)
Sodium: 141 mmol/L (ref 135–145)

## 2014-12-24 LAB — GLUCOSE, CAPILLARY
Glucose-Capillary: 71 mg/dL (ref 70–99)
Glucose-Capillary: 92 mg/dL (ref 70–99)
Glucose-Capillary: 86 mg/dL (ref 70–99)

## 2014-12-24 MED ORDER — FUROSEMIDE 10 MG/ML IJ SOLN
80.0000 mg | Freq: Two times a day (BID) | INTRAMUSCULAR | Status: DC
  Administered 2014-12-24 – 2014-12-26 (×4): 80 mg via INTRAVENOUS
  Filled 2014-12-24 (×4): qty 8

## 2014-12-24 NOTE — Progress Notes (Signed)
Called to room, pt coughing, states she woke up feeling like she was choking, feels like something is in her throat, oral suction set up at bedside, pt in upright sitting position, lung sounds course, small amount of clear sputum noted, VS stable see flow sheet, will continue to monitor closely.

## 2014-12-24 NOTE — Progress Notes (Signed)
[facility] PROGRESS NOTE  Melanie Camacho WHQ:[id number] DOB: 03/03/1932 DOA: 12/14/2014 PCP: PROVIDER NOT IN SYSTEM Interim summary: 81 year old lady admitted for SOB. She was being treated for acute on chronic diastolic heart failure.  Assessment/Plan: #1 acute on chronic diastolic CHF exacerbation Improving. Cardiac enzymes minimally elevated however seems to have plateaued. BNP was elevated on admission at 1222.9. 2-D echo with EF of 55-60% with no wall motion abnormalities. Aortic valve of the prosthesis was present with normal range of motion no evidence of dehiscence.  Continue current dose of IV Lasix, zebeta, welchol.  Cardiology following and appreciate input and recommendations. Her net fluid since admission is -15 liters. Recommed continue the IV lasix.   #2 atrial fibrillation Continue beta blocker for rate control. Continue eliquis for anticoagulation.  #3 status post AVR 2011 at [facility] 2-D echo with prosthesis present with normal range of motion and no evidence of dehiscence. Per cardiology.  #4 well-controlled diabetes mellitus diet-controlled Hemoglobin A1c is 5.5. CBGs have ranged from 94-119. Sliding scale insulin. CBG (last 3)   Recent Labs  12/23/14 2158 12/24/14 0743 12/24/14 1156  GLUCAP 111* 71 92      #5 hypothyroidism Patient with prior history of hypothyroidism, however per family patient had been off thyroid medication for approximately 3 years. Patient noted to be volume overloaded with complaints of feeling cold all the time and lethargic. TSH obtained is elevated at 7.734. Free T4 is decreased at 0.75. Free T3 is decreased at 1.6 total T3 was decreased at 66. Continue Synthroid 25 mcg daily. Patient will need repeat thyroid function studies done in about 4-6 weeks as outpatient.  #6 prophylaxis Protonix for GI prophylaxis. Eliquis for anticoagulation.  Mild acute on CKD. : Probably from diuresis. Monitor and replete potassium as needed.     Anemia: Macrocytic. Anemia panel will be sent. Showed b12 levels of 470 and normal folate levels. Low iron levels. Iron supplements on discharge    Code Status: Full Family Communication: Updated patient , none at bedside.  Disposition Plan: Remain inpatient.   Consultants:  Cardiology: Dr. Allyson Sabal 12/14/2014  Procedures:  Chest x-ray 12/15/2014, 12/14/2014  2-D echo 12/15/2014    Antibiotics:  None  HPI/Subjective: Sob improved , reports having many questions, but doesn't remember.   Objective: Filed Vitals:   12/24/14 0846  BP: 102/52  Pulse: 94  Temp: 97.5 F (36.4 C)  Resp: 26    Intake/Output Summary (Last 24 hours) at 12/24/14 1629 Last data filed at 12/24/14 1600  Gross per 24 hour  Intake    240 ml  Output   2575 ml  Net  -2335 ml   Filed Weights   12/22/14 0400 12/23/14 0445 12/24/14 0500  Weight: 105.552 kg (232 lb 11.2 oz) 105.371 kg (232 lb 4.8 oz) 106.397 kg (234 lb 9 oz)    Exam:   General:  NAD.   Cardiovascular: Irregularly irregular,  Respiratory: air entry fair, no wheezing or rhonchi, no rales hears  Abdomen: Soft, positive bowel sounds, nontender to palpation, lower abdominal wall edema.  Musculoskeletal: No clubbing cyanosis. 2 + edema upto the knees, scabs over the legs.   Data Reviewed: Basic Metabolic Panel:  Recent Labs Lab 12/20/14 0527 12/21/14 0517 12/22/14 0607 12/23/14 0436 12/24/14 0620  NA 140 138 140 140 141  K 3.7 4.2 4.1 3.8 4.3  CL 101 102 101 102 101  CO2 28 24 29 28 30   GLUCOSE 127* 88 89 85 79  BUN  33* 36* 37* 35* 37*  CREATININE 1.21* 1.32* 1.27* 1.27* 1.30*  CALCIUM 8.7 8.6 8.9 8.6 8.9   Liver Function Tests:  Recent Labs Lab 12/21/14 0517 12/22/14 0607  AST 30 28  ALT 14 13  ALKPHOS 63 67  BILITOT 0.7 0.7  PROT 6.5 7.0  ALBUMIN 2.7* 2.9*   No results for input(s): LIPASE, AMYLASE in the last 168 hours. No results for input(s): AMMONIA in the last 168 hours. CBC:  Recent  Labs Lab 12/18/14 0637 12/19/14 0538 12/21/14 0517  WBC 4.8 4.3 3.5*  HGB 11.8* 10.1* 10.4*  HCT 36.8 31.4* 34.1*  MCV 99.5 99.4 100.6*  PLT 145* PLATELET CLUMPS NOTED ON SMEAR, UNABLE TO ESTIMATE 172   Cardiac Enzymes: No results for input(s): CKTOTAL, CKMB, CKMBINDEX, TROPONINI in the last 168 hours. BNP (last 3 results)  Recent Labs  12/14/14 1410  BNP 1222.9*    ProBNP (last 3 results) No results for input(s): PROBNP in the last 8760 hours.  CBG:  Recent Labs Lab 12/22/14 1122 12/22/14 1656 12/23/14 2158 12/24/14 0743 12/24/14 1156  GLUCAP 100* 96 111* 71 92    Recent Results (from the past 240 hour(s))  MRSA PCR Screening     Status: None   Collection Time: 12/14/14  7:46 PM  Result Value Ref Range Status   MRSA by PCR NEGATIVE NEGATIVE Final    Comment:        The GeneXpert MRSA Assay (FDA approved for NASAL specimens only), is one component of a comprehensive MRSA colonization surveillance program. It is not intended to diagnose MRSA infection nor to guide or monitor treatment for MRSA infections.      Studies: No results found.  Scheduled Meds: . apixaban  5 mg Oral BID  . bisoprolol  5 mg Oral Daily  . colesevelam  1,875 mg Oral BID WC  . docusate sodium  100 mg Oral BID  . furosemide  80 mg Intravenous Q12H  . gabapentin  300 mg Oral BID  . insulin aspart  0-9 Units Subcutaneous TID WC  . levothyroxine  25 mcg Oral QAC breakfast  . multivitamin with minerals  1 tablet Oral Daily  . multivitamin-lutein  2 capsule Oral Daily  . nystatin cream   Topical BID  . pantoprazole  40 mg Oral QHS  . potassium chloride  40 mEq Oral Daily  . sodium chloride  3 mL Intravenous Q12H   Continuous Infusions:   Principal Problem:   Acute on chronic diastolic heart failure Active Problems:   Chronic a-fib   S/P AVR-(tissue) 2011 at [facility] - no CABG with this   HTN- (transient hypotension during recent admission)   Hypothyroid   Obesity- BMI 45    DM (diabetes mellitus)   Left sided chest pain   Type 2 diabetes mellitus without complication   Congestive heart disease    Time spent: 25 minutes    Melanie Camacho M.D. [facility] Pager [phone]  If 7PM-7AM, please contact night-coverage at [email or web address], password [facility] 12/24/2014, 4:29 PM  LOS: 10 days

## 2014-12-24 NOTE — Progress Notes (Signed)
Consulting cardiologist: Cassell Clement MD Primary Cardiologist:Kelly, Maisie Fus Now followed: Dr. Tedd Sias  Cardiology Specific Problem List: 1.Acute on Chronic Diastolic CHF 2. Chronic Afib 3. S/P AVR repair 4. Hypertension   Subjective:    Complaints of coughing and choking after eating. Has awakened with coughing and choking not long after eating breakfast this am. Denies chest pain or dyspnea.   Objective:   Temp:  [97.3 F (36.3 C)-97.9 F (36.6 C)] 97.5 F (36.4 C) (04/16 0846) Pulse Rate:  [67-95] 94 (04/16 0846) Resp:  [18-26] 26 (04/16 0846) BP: (92-126)/(36-58) 102/52 mmHg (04/16 0846) SpO2:  [93 %-100 %] 95 % (04/16 0846) Weight:  [234 lb 9 oz (106.397 kg)] 234 lb 9 oz (106.397 kg) (04/16 0500) Last BM Date: 12/21/14  Filed Weights   12/22/14 0400 12/23/14 0445 12/24/14 0500  Weight: 232 lb 11.2 oz (105.552 kg) 232 lb 4.8 oz (105.371 kg) 234 lb 9 oz (106.397 kg)    Intake/Output Summary (Last 24 hours) at 12/24/14 0908 Last data filed at 12/24/14 0500  Gross per 24 hour  Intake    240 ml  Output   1925 ml  Net  -1685 ml    Telemetry:  Atrial fib rate of 90's  Exam:  General: No acute distress.  HEENT: Conjunctiva and lids normal, oropharynx clear.  Lungs: Clear to auscultation, nonlabored.  Cardiac: No elevated JVP or bruits. IRRR, no gallop or rub.   Abdomen: Normoactive bowel sounds, nontender, nondistended.  Extremities: Erythema with weeping on the right LE, scaling. Left leg with two dressings, erythema noted, diminished DP pulses.   Neuropsychiatric: Alert and oriented x3, affect appropriate.   Lab Results:  Basic Metabolic Panel:  Recent Labs Lab 12/22/14 0607 12/23/14 0436 12/24/14 0620  NA 140 140 141  K 4.1 3.8 4.3  CL 101 102 101  CO2 29 28 30   GLUCOSE 89 85 79  BUN 37* 35* 37*  CREATININE 1.27* 1.27* 1.30*  CALCIUM 8.9 8.6 8.9    Liver Function Tests:  Recent Labs Lab 12/21/14 0517 12/22/14 0607  AST 30  28  ALT 14 13  ALKPHOS 63 67  BILITOT 0.7 0.7  PROT 6.5 7.0  ALBUMIN 2.7* 2.9*    CBC:  Recent Labs Lab 12/18/14 0637 12/19/14 0538 12/21/14 0517  WBC 4.8 4.3 3.5*  HGB 11.8* 10.1* 10.4*  HCT 36.8 31.4* 34.1*  MCV 99.5 99.4 100.6*  PLT 145* PLATELET CLUMPS NOTED ON SMEAR, UNABLE TO ESTIMATE 172    Radiology: CXR 12/20/2014 FINDINGS: The cardiac shadow remains enlarged. A right-sided pleural effusion and right basilar atelectasis is again identified. Postsurgical changes are seen. The left lung shows a small effusion but stable. No acute bony abnormality is seen.  IMPRESSION: Bilateral effusions right greater than left. Right basilar atelectasis is noted as well.:   Medications:   Scheduled Medications: . apixaban  5 mg Oral BID  . bisoprolol  5 mg Oral Daily  . colesevelam  1,875 mg Oral BID WC  . docusate sodium  100 mg Oral BID  . furosemide  80 mg Intravenous Q12H  . gabapentin  300 mg Oral BID  . insulin aspart  0-9 Units Subcutaneous TID WC  . levothyroxine  25 mcg Oral QAC breakfast  . multivitamin with minerals  1 tablet Oral Daily  . multivitamin-lutein  2 capsule Oral Daily  . nystatin cream   Topical BID  . pantoprazole  40 mg Oral QHS  . potassium chloride  40 mEq Oral  Daily  . sodium chloride  3 mL Intravenous Q12H      PRN Medications: sodium chloride, acetaminophen, ALPRAZolam, cyclobenzaprine, gi cocktail, ondansetron (ZOFRAN) IV, oxycodone, rOPINIRole, sodium chloride   Assessment and Plan:   1. Acute on Chronic Diastolic CHF: Diuresed 1.9 cc overnight with total of 15.3 liters total since admission. Wts do not appear to correlate with I/O. No significant edema noted with the exception of mild edema in the LE bilaterally. Currently on Lasix 80 mg IV BID. Creatinine 1.30 with CO2 of 30. May go down to daily IV lasix today and transition to po tomorrow. Uncertain dry wt. Home medications reviewed demonstrating lasxi 40-60 mg po prn for fluid  retention. Will likely need to be on daily dosing. Repeat BMET in am.   2. Frequent coughing and choking: States usually occurs not long after eating. States that she has been having this issue a lot lately, having to drink water and other fluids just to be able to get down pills and food, which is difficult for her. Consider swallowing study at the discretion of PCP or hospitaliist service.   3.Atrial fib: Heart rate is in the 80's and 90's at rest. CHADS VASC Score of 5. On Eliquis. No evidence of bleeding. H&H is stable. On zetia 5 mg daily only for rate control.   4. Hypertension: Low normal. She is stable but fatigued.   5. Deconditioning: Remain OOB as much as possible. Ambulate with assistance.   Melanie Camacho. Lawrence NP AACC  12/24/2014, 9:08 AM   Patient seen and examined and history reviewed. Agree with above findings and plan. She still feels SOB but states it is better than when she came in. Good diuresis. 15 liters since admit. States her normal weight is 201-205 lbs but thinks her home scales aren't working. She is still deconditioned and planning to go to Rehab. AFib rate is controlled. Will continue IV lasix today and consider switching to po tomorrow.    Peter [place], MDFACC 12/24/2014 12:00 PM

## 2014-12-25 LAB — BASIC METABOLIC PANEL
Anion gap: 9 (ref 5–15)
BUN: 37 mg/dL — AB (ref 6–23)
CHLORIDE: 100 mmol/L (ref 96–112)
CO2: 28 mmol/L (ref 19–32)
Calcium: 8.7 mg/dL (ref 8.4–10.5)
Creatinine, Ser: 1.23 mg/dL — ABNORMAL HIGH (ref 0.50–1.10)
GFR calc Af Amer: 47 mL/min — ABNORMAL LOW (ref >90)
GFR, EST NON AFRICAN AMERICAN: 40 mL/min — AB (ref >90)
Glucose, Bld: 85 mg/dL (ref 70–99)
POTASSIUM: 3.9 mmol/L (ref 3.5–5.1)
SODIUM: 137 mmol/L (ref 135–145)

## 2014-12-25 LAB — GLUCOSE, CAPILLARY
GLUCOSE-CAPILLARY: 90 mg/dL (ref 70–99)
Glucose-Capillary: 96 mg/dL (ref 70–99)
Glucose-Capillary: 81 mg/dL (ref 70–99)
Glucose-Capillary: 98 mg/dL (ref 70–99)
Glucose-Capillary: 102 mg/dL — ABNORMAL HIGH (ref 70–99)

## 2014-12-25 MED ORDER — METOLAZONE 5 MG PO TABS
2.5000 mg | ORAL_TABLET | Freq: Every day | ORAL | Status: DC
  Administered 2014-12-25 – 2014-12-27 (×3): 2.5 mg via ORAL
  Filled 2014-12-25 (×4): qty 1

## 2014-12-25 NOTE — Evaluation (Signed)
Clinical/Bedside Swallow Evaluation Patient Details  Name: Melanie Camacho MRN: [id number] Date of Birth: [birth year]  Today's Date: 12/25/2014 Time: SLP Start Time (ACUTE ONLY): 0131 SLP Stop Time (ACUTE ONLY): 0156 SLP Time Calculation (min) (ACUTE ONLY): 25 min  Past Medical History:  Past Medical History  Diagnosis Date  . Anginal pain   . Hypertension   . Heart murmur   . CHF (congestive heart failure)   . Shortness of breath   . Complication of anesthesia     "I had a slight reaction to the morphine given in surgery"  . High cholesterol   . Coronary artery disease   . Myocardial infarction 12/2013  . Pneumonia "once or twice"  . 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 Type II diabetes mellitus   . Anemia   . History of blood transfusion     "related to the anemia"  . GERD (gastroesophageal reflux disease)   . Arthritis     "all over"  . Chronic lower back pain    Past Surgical History:  Past Surgical History  Procedure Laterality Date  . Left heart catheterization with coronary angiogram N/A 12/28/2013    Procedure: LEFT HEART CATHETERIZATION WITH CORONARY ANGIOGRAM;  Surgeon: Lennette Bihari, MD;  Location: [facility] CATH LAB;  Service: Cardiovascular;  Laterality: N/A;  . Cardiac catheterization    . Cholecystectomy open  1980's  . Appendectomy  ~ 1948  . Tonsillectomy  1954  . Hernia repair    . Abdominal hernia repair  "many times"  . Hemorrhoid banding    . Fracture surgery    . Foot fracture surgery Right ~ 1980  . Cataract extraction Right    HPI:  Melanie Camacho is a 81 y.o. female, with a past medical history of A. Fib, coronary artery disease with NSTEMI on 12/22/2013, aortic valve replacement (porcine tissue), diet controlled diabetes, hypertension, and hypothyroidism. Pt admitted on 12-14-14 with CHF exacerbation and hypoxia. Pt with recent onset of change in swallow function with complaints of  "coughing during meals and food getting stuck".    Assessment /  Plan / Recommendation Clinical Impression  Pt presents with suspected mild pharyngeal dysphagia. No observable overt signs or symptoms of aspriation, however pt reports coughing mid meals after solid consistencies. Educated patient regarding safe swallow strategies including alternating liquids and solids, which she states feeling relief from. Pt unable to utilize liquid wash technique consistently secondary to fluid restrictions. Pt also with complaints of changes in vocal quality characterized by vocal fatigue and decreased vocal intensity. Continue regular thin diet at this time. Recommend FEES for objective evaluation of swallowing and voice.    Aspiration Risk  Mild    Diet Recommendation Regular;Thin liquid   Liquid Administration via: Straw;Cup Medication Administration: Whole meds with liquid Supervision: Patient able to self feed Compensations: Follow solids with liquid;Slow rate;Small sips/bites;Effortful swallow Postural Changes and/or Swallow Maneuvers: Seated upright 90 degrees    Other  Recommendations Recommended Consults: FEES Oral Care Recommendations: Oral care BID   Follow Up Recommendations       Frequency and Duration min 1 x/week  1 week   Pertinent Vitals/Pain None stated    SLP Swallow Goals     Swallow Study Prior Functional Status       General Date of Onset: 12/23/14 HPI: Melanie Camacho is a 81 y.o. female, with a past medical history of A. Fib, coronary artery disease with NSTEMI on 12/22/2013, aortic  valve replacement (porcine tissue), diet controlled diabetes, hypertension, and hypothyroidism. Pt admitted on 12-14-14 with CHF exacerbation and hypoxia. Pt with recent onset of change in swallow function with complaints of  "coughing during meals and food getting stuck".  Type of Study: Bedside swallow evaluation Diet Prior to this Study: Regular;Thin liquids Temperature Spikes Noted: No Respiratory Status: Room air History of Recent Intubation:  No Behavior/Cognition: Alert;Cooperative;Pleasant mood Oral Cavity - Dentition: Dentures, top;Dentures, bottom Self-Feeding Abilities: Able to feed self Patient Positioning: Upright in chair Baseline Vocal Quality: Clear;Low vocal intensity Volitional Cough: Strong Volitional Swallow: Able to elicit    Oral/Motor/Sensory Function Overall Oral Motor/Sensory Function: Appears within functional limits for tasks assessed Labial ROM: Within Functional Limits Labial Symmetry: Within Functional Limits Labial Strength: Within Functional Limits Labial Sensation: Within Functional Limits Lingual ROM: Within Functional Limits Lingual Symmetry: Within Functional Limits Lingual Strength: Within Functional Limits   Ice Chips Ice chips: Not tested   Thin Liquid Thin Liquid: Within functional limits Presentation: Straw    Nectar Thick Nectar Thick Liquid: Not tested   Honey Thick Honey Thick Liquid: Not tested   Puree Puree: Not tested   Solid   GO    Solid: Impaired Presentation: Self Fed Pharyngeal Phase Impairments: Throat Clearing - Delayed;Multiple swallows Other Comments:  (verbalizes "food feeling stuck")      Marcene Duos MA, CCC-SLP Acute Care Speech Language Pathologist    Marcene Duos E 12/25/2014,2:13 PM

## 2014-12-25 NOTE — Progress Notes (Signed)
[facility] PROGRESS NOTE  Melanie Camacho ZOX:[id number] DOB: [birth year] DOA: 12/14/2014 PCP: PROVIDER NOT IN SYSTEM Interim summary: 80 year old lady admitted for SOB. She was being treated for acute on chronic diastolic heart failure.  Assessment/Plan: #1 acute on chronic diastolic CHF exacerbation Improving. Cardiac enzymes minimally elevated however seems to have plateaued. BNP was elevated on admission at 1222.9. 2-D echo with EF of 55-60% with no wall motion abnormalities. Aortic valve of the prosthesis was present with normal range of motion no evidence of dehiscence.  Continue current dose of IV Lasix, zebeta, welchol diuresed about 17 liters since admission.  Cardiology following and appreciate input and recommendations, added metolazone to the lasix regimen for more diuresis.   #2 atrial fibrillation Continue beta blocker for rate control. Continue eliquis for anticoagulation.  #3 status post AVR 2011 at [facility]. 2-D echo with prosthesis present with normal range of motion and no evidence of dehiscence. Per cardiology.  #4 well-controlled diabetes mellitus diet-controlled Hemoglobin A1c is 5.5. CBGs have ranged from 94-119. Sliding scale insulin. CBG (last 3)   Recent Labs  12/24/14 1730 12/24/14 2118 12/25/14 0728  GLUCAP 86 96 81      #5 hypothyroidism Patient with prior history of hypothyroidism, however per family patient had been off thyroid medication for approximately 3 years. Patient noted to be volume overloaded with complaints of feeling cold all the time and lethargic. TSH obtained is elevated at 7.734. Free T4 is decreased at 0.75. Free T3 is decreased at 1.6 total T3 was decreased at 66. Continue Synthroid 25 mcg daily. Patient will need repeat thyroid function studies done in about 4-6 weeks as outpatient.  #6 prophylaxis Protonix for GI prophylaxis. Eliquis for anticoagulation.  Mild acute on CKD. : Probably from diuresis. Monitor and replete  potassium as needed.    Anemia: Macrocytic. Anemia panel will be sent. Showed b12 levels of 470 and normal folate levels. Low iron levels. Iron supplements on discharge   Stage 2 CKD: - appears stable.    Code Status: Full Family Communication: Updated patient , none at bedside.  Disposition Plan: Remain inpatient.   Consultants:  Cardiology: Dr. Allyson Sabal 12/14/2014  Procedures:  Chest x-ray 12/15/2014, 12/14/2014  2-D echo 12/15/2014    Antibiotics:  None  HPI/Subjective: Reports no change in sob since yesterday. Still feel weak and sob.   Objective: Filed Vitals:   12/25/14 0500  BP: 94/54  Pulse: 85  Temp: 97.6 F (36.4 C)  Resp: 20    Intake/Output Summary (Last 24 hours) at 12/25/14 1049 Last data filed at 12/25/14 0500  Gross per 24 hour  Intake      0 ml  Output   1900 ml  Net  -1900 ml   Filed Weights   12/23/14 0445 12/24/14 0500 12/25/14 0500  Weight: 105.371 kg (232 lb 4.8 oz) 106.397 kg (234 lb 9 oz) 106.958 kg (235 lb 12.8 oz)    Exam:   General:  NAD.   Cardiovascular: Irregularly irregular,  Respiratory: air entry fair, no wheezing or rhonchi, no rales hears  Abdomen: Soft, positive bowel sounds, nontender to palpation, lower abdominal wall edema.  Musculoskeletal: No clubbing cyanosis. 2 + edema upto the knees, scabs over the legs.   Data Reviewed: Basic Metabolic Panel:  Recent Labs Lab 12/21/14 0517 12/22/14 0607 12/23/14 0436 12/24/14 0620 12/25/14 0434  NA 138 140 140 141 137  K 4.2 4.1 3.8 4.3 3.9  CL 102 101 102 101 100  CO2  GLUCOSE 88 89 85 79 85  BUN 36* 37* 35* 37* 37*  CREATININE 1.32* 1.27* 1.27* 1.30* 1.23*  CALCIUM 8.6 8.9 8.6 8.9 8.7   Liver Function Tests:  Recent Labs Lab 12/21/14 0517 12/22/14 0607  AST 30 28  ALT 14 13  ALKPHOS 63 67  BILITOT 0.7 0.7  PROT 6.5 7.0  ALBUMIN 2.7* 2.9*   No results for input(s): LIPASE, AMYLASE in the last 168 hours. No results for  input(s): AMMONIA in the last 168 hours. CBC:  Recent Labs Lab 12/19/14 0538 12/21/14 0517  WBC 4.3 3.5*  HGB 10.1* 10.4*  HCT 31.4* 34.1*  MCV 99.4 100.6*  PLT PLATELET CLUMPS NOTED ON SMEAR, UNABLE TO ESTIMATE 172   Cardiac Enzymes: No results for input(s): CKTOTAL, CKMB, CKMBINDEX, TROPONINI in the last 168 hours. BNP (last 3 results)  Recent Labs  12/14/14 1410  BNP 1222.9*    ProBNP (last 3 results) No results for input(s): PROBNP in the last 8760 hours.  CBG:  Recent Labs Lab 12/24/14 0743 12/24/14 1156 12/24/14 1730 12/24/14 2118 12/25/14 0728  GLUCAP 71 92 86 96 81    No results found for this or any previous visit (from the past 240 hour(s)).   Studies: No results found.  Scheduled Meds: . apixaban  5 mg Oral BID  . bisoprolol  5 mg Oral Daily  . colesevelam  1,875 mg Oral BID WC  . docusate sodium  100 mg Oral BID  . furosemide  80 mg Intravenous Q12H  . gabapentin  300 mg Oral BID  . insulin aspart  0-9 Units Subcutaneous TID WC  . levothyroxine  25 mcg Oral QAC breakfast  . metolazone  2.5 mg Oral Daily  . multivitamin with minerals  1 tablet Oral Daily  . multivitamin-lutein  2 capsule Oral Daily  . nystatin cream   Topical BID  . pantoprazole  40 mg Oral QHS  . potassium chloride  40 mEq Oral Daily  . sodium chloride  3 mL Intravenous Q12H   Continuous Infusions:   Principal Problem:   Acute on chronic diastolic heart failure Active Problems:   Chronic a-fib   S/P AVR-(tissue) 2011 at [facility] - no CABG with this   HTN- (transient hypotension during recent admission)   Hypothyroid   Obesity- BMI 45   DM (diabetes mellitus)   Left sided chest pain   Type 2 diabetes mellitus without complication   Congestive heart disease    Time spent: 25 minutes    Blakeley Scheier M.D. [facility] Pager [phone]  If 7PM-7AM, please contact night-coverage at [email or web address], password [email or web address] 12/25/2014, 10:49 AM  LOS: 11 days

## 2014-12-25 NOTE — Progress Notes (Signed)
Consulting cardiologist: Cassell Clement MD Primary Cardiologist:Kelly, Maisie Fus Now followed: Dr. Tedd Sias  Cardiology Specific Problem List: 1.Acute on Chronic Diastolic CHF 2. Chronic Afib 3. S/P AVR repair 4. Hypertension   Subjective:    Complaints of SOB when getting up out of bed. Still has marked LE edema and the patient reports it is unchanged.   Objective:   Temp:  [97.5 F (36.4 C)-97.6 F (36.4 C)] 97.6 F (36.4 C) (04/17 0500) Pulse Rate:  [85-94] 85 (04/17 0500) Resp:  [18-26] 20 (04/17 0500) BP: (94-108)/(48-70) 94/54 mmHg (04/17 0500) SpO2:  [93 %-95 %] 94 % (04/17 0500) Weight:  [235 lb 12.8 oz (106.958 kg)] 235 lb 12.8 oz (106.958 kg) (04/17 0500) Last BM Date: 12/21/14  Filed Weights   12/23/14 0445 12/24/14 0500 12/25/14 0500  Weight: 232 lb 4.8 oz (105.371 kg) 234 lb 9 oz (106.397 kg) 235 lb 12.8 oz (106.958 kg)    Intake/Output Summary (Last 24 hours) at 12/25/14 0843 Last data filed at 12/25/14 0500  Gross per 24 hour  Intake      0 ml  Output   1900 ml  Net  -1900 ml    Telemetry:  Atrial fib rate of 90's  Exam:  General: No acute distress.  HEENT: Conjunctiva and lids normal, oropharynx clear.  Lungs: Clear to auscultation, nonlabored.  Cardiac: No elevated JVP or bruits. IRRR, no gallop or rub.   Abdomen: Normoactive bowel sounds, nontender, nondistended.  Extremities: Erythema with weeping on the right LE, scaling. Left leg with two dressings, erythema noted, diminished DP pulses. 3+ edema  Neuropsychiatric: Alert and oriented x3, affect appropriate.   Lab Results:  Basic Metabolic Panel:  Recent Labs Lab 12/23/14 0436 12/24/14 0620 12/25/14 0434  NA 140 141 137  K 3.8 4.3 3.9  CL 102 101 100  CO2 GLUCOSE 85 79 85  BUN 35* 37* 37*  CREATININE 1.27* 1.30* 1.23*  CALCIUM 8.6 8.9 8.7    Liver Function Tests:  Recent Labs Lab 12/21/14 0517 12/22/14 0607  AST 30 28  ALT 14 13  ALKPHOS 63 67    BILITOT 0.7 0.7  PROT 6.5 7.0  ALBUMIN 2.7* 2.9*    CBC:  Recent Labs Lab 12/19/14 0538 12/21/14 0517  WBC 4.3 3.5*  HGB 10.1* 10.4*  HCT 31.4* 34.1*  MCV 99.4 100.6*  PLT PLATELET CLUMPS NOTED ON SMEAR, UNABLE TO ESTIMATE 172    Radiology: CXR 12/20/2014 FINDINGS: The cardiac shadow remains enlarged. A right-sided pleural effusion and right basilar atelectasis is again identified. Postsurgical changes are seen. The left lung shows a small effusion but stable. No acute bony abnormality is seen.  IMPRESSION: Bilateral effusions right greater than left. Right basilar atelectasis is noted as well.:   Medications:   Scheduled Medications: . apixaban  5 mg Oral BID  . bisoprolol  5 mg Oral Daily  . colesevelam  1,875 mg Oral BID WC  . docusate sodium  100 mg Oral BID  . furosemide  80 mg Intravenous Q12H  . gabapentin  300 mg Oral BID  . insulin aspart  0-9 Units Subcutaneous TID WC  . levothyroxine  25 mcg Oral QAC breakfast  . multivitamin with minerals  1 tablet Oral Daily  . multivitamin-lutein  2 capsule Oral Daily  . nystatin cream   Topical BID  . pantoprazole  40 mg Oral QHS  . potassium chloride  40 mEq Oral Daily  . sodium chloride  3  mL Intravenous Q12H      PRN Medications: sodium chloride, acetaminophen, ALPRAZolam, cyclobenzaprine, gi cocktail, ondansetron (ZOFRAN) IV, oxycodone, rOPINIRole, sodium chloride   Assessment and Plan:   1. Acute on Chronic Diastolic CHF: Diuresed 1.9 cc overnight with total of 16+ liters total since admission. Weight is actually increased since admit.  Persistent bilateral edema. Currently on Lasix 80 mg IV BID. Creatinine 1.30 with CO2 of 30. Will add metolazone for improved diuresis. Will follow BMET closely.   2. Atrial fib: Heart rate is well controlled. CHADS VASC Score of 5. On Eliquis. No evidence of bleeding. H&H is stable. On zetia 5 mg daily only for rate control.   4. Hypertension: Low normal.   5.  Deconditioning: Remain OOB as much as possible. Ambulate with assistance.     Peter Swaz[place], MDFACC 12/25/2014 8:43 AM

## 2014-12-26 DIAGNOSIS — R600 Localized edema: Secondary | ICD-10-CM

## 2014-12-26 LAB — GLUCOSE, CAPILLARY
GLUCOSE-CAPILLARY: 75 mg/dL (ref 70–99)
GLUCOSE-CAPILLARY: 107 mg/dL — AB (ref 70–99)
GLUCOSE-CAPILLARY: 95 mg/dL (ref 70–99)
Glucose-Capillary: 111 mg/dL — ABNORMAL HIGH (ref 70–99)

## 2014-12-26 MED ORDER — FUROSEMIDE 10 MG/ML IJ SOLN
80.0000 mg | Freq: Three times a day (TID) | INTRAMUSCULAR | Status: DC
  Administered 2014-12-26 – 2014-12-27 (×3): 80 mg via INTRAVENOUS
  Filled 2014-12-26 (×3): qty 8

## 2014-12-26 NOTE — Progress Notes (Signed)
Physical Therapy Treatment Patient Details Name: SHIKIA EDOUARD MRN: [id number] DOB: 1932/01/31 Today's Date: 01/10/15    History of Present Illness Pt adm with acute on chronic diastolic heart failure. PMH - CAD, NSTEMI, AVR, DM, HTN, CHF, a-fib.    PT Comments    Pt making steady progress.  Follow Up Recommendations  SNF     Equipment Recommendations  None recommended by PT    Recommendations for Other Services       Precautions / Restrictions Precautions Precautions: Fall Precaution Comments: very fearful of falling Restrictions Weight Bearing Restrictions: No    Mobility  Bed Mobility                  Transfers Overall transfer level: Needs assistance Equipment used: 2 person hand held assist   Sit to Stand: +2 physical assistance;Mod assist         General transfer comment: Pt prefers hand held assist technique due to shoulder pain. Pt requires mod A +2 to stand and maintain balance. Pt sits with only min A and is able to slowly descend to seat.   Ambulation/Gait Ambulation/Gait assistance: Min guard Ambulation Distance (Feet): 45 Feet Assistive device: Rolling walker (2 wheeled) Gait Pattern/deviations: Step-to pattern;Decreased step length - right;Decreased step length - left;Shuffle;Trunk flexed Gait velocity: decr Gait velocity interpretation: Below normal speed for age/gender General Gait Details: Pt able to stay closer to walker now that she is using shorter youth walker. Several standing rest breaks to relieve bil shoulders.   Stairs            Wheelchair Mobility    Modified Rankin (Stroke Patients Only)       Balance Overall balance assessment: Needs assistance Sitting-balance support: No upper extremity supported Sitting balance-Leahy Scale: Fair       Standing balance-Leahy Scale: Poor Standing balance comment: UE support on walker.                    Cognition Arousal/Alertness: Awake/alert Behavior During  Therapy: WFL for tasks assessed/performed Overall Cognitive Status: Within Functional Limits for tasks assessed                      Exercises      General Comments        Pertinent Vitals/Pain Pain Assessment: Faces Faces Pain Scale: Hurts even more Pain Location: bil shoulders Pain Intervention(s): Limited activity within patient's tolerance;Repositioned    Home Living                      Prior Function            PT Goals (current goals can now be found in the care plan section) Progress towards PT goals: Progressing toward goals    Frequency  Min 2X/week    PT Plan Current plan remains appropriate    Co-evaluation             End of Session Equipment Utilized During Treatment: Gait belt Activity Tolerance: Patient tolerated treatment well Patient left: in chair;with call bell/phone within reach     Time: 1002-1021 PT Time Calculation (min) (ACUTE ONLY): 19 min  Charges:  $Gait Training: 8-22 mins                    G Codes:      Leandrea Ackley 10-Jan-2015, 11:02 AM  Skip Mayer PT [phone]

## 2014-12-26 NOTE — Progress Notes (Signed)
[facility] PROGRESS NOTE  Melanie Camacho WUJ:[id number] DOB: 11-Jan-1932 DOA: 12/14/2014 PCP: PROVIDER NOT IN SYSTEM Interim summary: 81 year old lady admitted for SOB. She was being treated for acute on chronic diastolic heart failure.  Assessment/Plan: #1 acute on chronic diastolic CHF exacerbation Improving. Cardiac enzymes minimally elevated however seems to have plateaued. BNP was elevated on admission at 1222.9. 2-D echo with EF of 55-60% with no wall motion abnormalities. Aortic valve of the prosthesis was present with normal range of motion no evidence of dehiscence.  Continue current dose of IV Lasix, zebeta, welchol diuresed about 18.4 liters since admission.  Cardiology following and appreciate input and recommendations, added metolazone to the lasix regimen for more diuresis.   #2 atrial fibrillation Continue beta blocker for rate control. Continue eliquis for anticoagulation.  #3 status post AVR 2011 at [facility]. 2-D echo with prosthesis present with normal range of motion and no evidence of dehiscence. Per cardiology.  #4 well-controlled diabetes mellitus diet-controlled Hemoglobin A1c is 5.5. CBGs have ranged from 94-119. Sliding scale insulin. CBG (last 3)   Recent Labs  12/26/14 0750 12/26/14 1154 12/26/14 1655  GLUCAP 75 107* 95      #5 hypothyroidism Patient with prior history of hypothyroidism, however per family patient had been off thyroid medication for approximately 3 years. Patient noted to be volume overloaded with complaints of feeling cold all the time and lethargic. TSH obtained is elevated at 7.734. Free T4 is decreased at 0.75. Free T3 is decreased at 1.6 total T3 was decreased at 66. Continue Synthroid 25 mcg daily. Patient will need repeat thyroid function studies done in about 4-6 weeks as outpatient.  #6 prophylaxis Protonix for GI prophylaxis. Eliquis for anticoagulation.  Mild acute on CKD. : Probably from diuresis. Monitor and replete  potassium as needed.    Anemia: Macrocytic. Anemia panel will be sent. Showed b12 levels of 470 and normal folate levels. Low iron levels. Iron supplements on discharge   Stage 2 CKD: - appears stable.    Code Status: Full Family Communication: Updated patient , none at bedside.  Disposition Plan: Remain inpatient.   Consultants:  Cardiology: Dr. Allyson Sabal 12/14/2014  Procedures:  Chest x-ray 12/15/2014, 12/14/2014  2-D echo 12/15/2014    Antibiotics:  None  HPI/Subjective: Her legs feel still heavy. Changed her diet to carb modified in addition to heart and healthy.   Objective: Filed Vitals:   12/26/14 0643  BP: 105/61  Pulse:   Temp:   Resp:     Intake/Output Summary (Last 24 hours) at 12/26/14 1838 Last data filed at 12/26/14 1700  Gross per 24 hour  Intake    520 ml  Output   1900 ml  Net  -1380 ml   Filed Weights   12/25/14 0500 12/26/14 0500 12/26/14 1520  Weight: 106.958 kg (235 lb 12.8 oz) 107.049 kg (236 lb) 106.3 kg (234 lb 5.6 oz)    Exam:   General:  NAD.   Cardiovascular: Irregularly irregular,  Respiratory: air entry fair, no wheezing or rhonchi, no rales hears  Abdomen: Soft, positive bowel sounds, nontender to palpation, lower abdominal wall edema.  Musculoskeletal: No clubbing cyanosis. 2 + edema upto the knees, scabs over the legs.   Data Reviewed: Basic Metabolic Panel:  Recent Labs Lab 12/21/14 0517 12/22/14 0607 12/23/14 0436 12/24/14 0620 12/25/14 0434  NA 138 140 140 141 137  K 4.2 4.1 3.8 4.3 3.9  CL 102 101 102 101 100  CO2 24 29  28 30 28   GLUCOSE 88 89 85 79 85  BUN 36* 37* 35* 37* 37*  CREATININE 1.32* 1.27* 1.27* 1.30* 1.23*  CALCIUM 8.6 8.9 8.6 8.9 8.7   Liver Function Tests:  Recent Labs Lab 12/21/14 0517 12/22/14 0607  AST 30 28  ALT 14 13  ALKPHOS 63 67  BILITOT 0.7 0.7  PROT 6.5 7.0  ALBUMIN 2.7* 2.9*   No results for input(s): LIPASE, AMYLASE in the last 168 hours. No results for  input(s): AMMONIA in the last 168 hours. CBC:  Recent Labs Lab 12/21/14 0517  WBC 3.5*  HGB 10.4*  HCT 34.1*  MCV 100.6*  PLT 172   Cardiac Enzymes: No results for input(s): CKTOTAL, CKMB, CKMBINDEX, TROPONINI in the last 168 hours. BNP (last 3 results)  Recent Labs  12/14/14 1410  BNP 1222.9*    ProBNP (last 3 results) No results for input(s): PROBNP in the last 8760 hours.  CBG:  Recent Labs Lab 12/25/14 1703 12/25/14 2116 12/26/14 0750 12/26/14 1154 12/26/14 1655  GLUCAP 98 102* 75 107* 95    No results found for this or any previous visit (from the past 240 hour(s)).   Studies: No results found.  Scheduled Meds: . apixaban  5 mg Oral BID  . bisoprolol  5 mg Oral Daily  . colesevelam  1,875 mg Oral BID WC  . docusate sodium  100 mg Oral BID  . furosemide  80 mg Intravenous Q8H  . gabapentin  300 mg Oral BID  . insulin aspart  0-9 Units Subcutaneous TID WC  . levothyroxine  25 mcg Oral QAC breakfast  . metolazone  2.5 mg Oral Daily  . multivitamin with minerals  1 tablet Oral Daily  . multivitamin-lutein  2 capsule Oral Daily  . nystatin cream   Topical BID  . pantoprazole  40 mg Oral QHS  . potassium chloride  40 mEq Oral Daily  . sodium chloride  3 mL Intravenous Q12H   Continuous Infusions:   Principal Problem:   Acute on chronic diastolic heart failure Active Problems:   Chronic a-fib   S/P AVR-(tissue) 2011 at [facility] - no CABG with this   HTN- (transient hypotension during recent admission)   Hypothyroid   Obesity- BMI 45   DM (diabetes mellitus)   Left sided chest pain   Type 2 diabetes mellitus without complication   Congestive heart disease    Time spent: 25 minutes    Kadesia Robel M.D. [facility] Pager [phone]  If 7PM-7AM, please contact night-coverage at [email or web address], password [facility] 12/26/2014, 6:38 PM  LOS: 12 days

## 2014-12-26 NOTE — Progress Notes (Signed)
Subjective: Wants to go.  Legs still swollen.  Feet are numb.  Objective: Vital signs in last 24 hours: Temp:  [97.5 F (36.4 C)-97.7 F (36.5 C)] 97.7 F (36.5 C) (04/18 0500) Pulse Rate:  [74-91] 91 (04/18 0500) Resp:  [18-20] 18 (04/18 0500) BP: (87-107)/(52-61) 105/61 mmHg (04/18 0643) SpO2:  [93 %-98 %] 98 % (04/18 0500) Weight:  [236 lb (107.049 kg)] 236 lb (107.049 kg) (04/18 0500) Last BM Date: 12/25/14  Intake/Output from previous day: 04/17 0701 - 04/18 0700 In: 240 [P.O.:240] Out: 1200 [Urine:1200] Intake/Output this shift:    Medications Current Facility-Administered Medications  Medication Dose Route Frequency Provider Last Rate Last Dose  . 0.9 %  sodium chloride infusion  250 mL Intravenous PRN Stephani Police, PA-C      . acetaminophen (TYLENOL) tablet 650 mg  650 mg Oral Q4H PRN Stephani Police, PA-C   650 mg at 12/21/14 0020  . ALPRAZolam Prudy Feeler) tablet 0.25 mg  0.25 mg Oral QHS PRN Abelino Derrick, PA-C   0.25 mg at 12/23/14 2237  . apixaban (ELIQUIS) tablet 5 mg  5 mg Oral BID Stephani Police, PA-C   5 mg at 12/26/14 0849  . bisoprolol (ZEBETA) tablet 5 mg  5 mg Oral Daily Stephani Police, PA-C   5 mg at 12/26/14 0849  . colesevelam [facility]) tablet 1,875 mg  1,875 mg Oral BID WC Stephani Police, PA-C   1,875 mg at 12/25/14 1741  . cyclobenzaprine (FLEXERIL) tablet 5 mg  5 mg Oral TID PRN Rodolph Bong, MD      . docusate sodium (COLACE) capsule 100 mg  100 mg Oral BID Rodolph Bong, MD   100 mg at 12/24/14 0923  . furosemide (LASIX) injection 80 mg  80 mg Intravenous Q12H Kathlen Mody, MD   80 mg at 12/26/14 0849  . gabapentin (NEURONTIN) capsule 300 mg  300 mg Oral BID Stephani Police, PA-C   300 mg at 12/26/14 0849  . gi cocktail (Maalox,Lidocaine,Donnatal)  30 mL Oral BID PRN Stephani Police, PA-C   30 mL at 12/14/14 1640  . insulin aspart (novoLOG) injection 0-9 Units  0-9 Units Subcutaneous TID WC Rodolph Bong, MD   1 Units at 12/15/14  1735  . levothyroxine (SYNTHROID, LEVOTHROID) tablet 25 mcg  25 mcg Oral QAC breakfast Rodolph Bong, MD   25 mcg at 12/26/14 [phone]  . metolazone (ZAROXOLYN) tablet 2.5 mg  2.5 mg Oral Daily Peter M Swaziland, MD   2.5 mg at 12/26/14 0848  . multivitamin with minerals tablet 1 tablet  1 tablet Oral Daily Stephani Police, PA-C   1 tablet at 12/26/14 0849  . multivitamin-lutein (OCUVITE-LUTEIN) capsule 2 capsule  2 capsule Oral Daily Stephani Police, PA-C   2 capsule at 12/25/14 0944  . nystatin cream (MYCOSTATIN)   Topical BID Stephani Police, PA-C      . ondansetron [facility]) injection 4 mg  4 mg Intravenous Q6H PRN Stephani Police, PA-C      . oxycodone (OXY-IR) immediate release capsule 5 mg  5 mg Oral Q6H PRN Stephani Police, PA-C      . pantoprazole (PROTONIX) EC tablet 40 mg  40 mg Oral QHS Rodolph Bong, MD   40 mg at 12/25/14 2255  . potassium chloride SA (K-DUR,KLOR-CON) CR tablet 40 mEq  40 mEq Oral Daily Rodolph Bong, MD   40 mEq at 12/26/14 0850  .  rOPINIRole (REQUIP) tablet 0.25 mg  0.25 mg Oral TID PRN Laurey Morale, MD   0.25 mg at 12/21/14 2220  . sodium chloride 0.9 % injection 3 mL  3 mL Intravenous Q12H Stephani Police, PA-C   3 mL at 12/26/14 0850  . sodium chloride 0.9 % injection 3 mL  3 mL Intravenous PRN Stephani Police, PA-C   3 mL at 12/25/14 2304    PE: General appearance: alert, cooperative and no distress Lungs: Decreased BS and crackles on the right.  Heart: irregularly irregular rhythm Extremities: very tense pitting LEE Pulses: 2+ and symmetric Skin: Warm and dry Neurologic: Grossly normal  Lab Results:  No results for input(s): WBC, HGB, HCT, PLT in the last 72 hours. BMET  Recent Labs  12/24/14 0620 12/25/14 0434  NA 141 137  K 4.3 3.9  CL 101 100  CO2 30 28  GLUCOSE 79 85  BUN 37* 37*  CREATININE 1.30* 1.23*  CALCIUM 8.9 8.7   BNP (last 3 results)  Recent Labs  12/14/14 1410  BNP 1222.9*    ProBNP (last 3 results) No  results for input(s): PROBNP in the last 8760 hours.   Assessment/Plan  Principal Problem:   Acute on chronic diastolic heart failure Net fluids:  -1.0L/-18.3L.  PO was not chart over the weekend.  On IV Lasix  BID.  Metolazone 2.5mg  added yesterday.   SCr stable 1.23 today.    Her legs were better on Friday when I saw her.  Albumin level not helping.  Will give another 2.5 mg of metolazone tonight.  Will increase lasix to TID today.    Repeat CXR.      Chronic a-fib  Rate stable.  On eliquis.   S/P AVR-(tissue) 2011 at [facility] - no CABG with this   HTN- (transient hypotension during recent admission)  Some hypotension.     Hypothyroid  Synthroid   Obesity- BMI 45   Type 2 diabetes mellitus without complication     LOS: 12 days    HAGER, BRYAN PA-C 12/26/2014 9:10 AM    Patient seen and examined. Agree with assessment and plan. Continues to diurese -[id number] since admission. S/P bioprosthetic AVR; normal coronaries on last cath 12/2013. Still with tense LE edema. Will f/u BNP. AF rate in 90's. On eliquis anticoagulation. F/u renal fxn tomorrow with dose adjustment above.   Lennette Bihari, MD, [facility] 12/26/2014 11:28 AM

## 2014-12-26 NOTE — Progress Notes (Addendum)
CSW (Clinical Child psychotherapist) checking with [facility] to see if they are able to accept pt on IV lasix.  Awaiting return phone call.   ADDENDUM 4:00pm: CSW spoke with [facility] and they are able to do IV lasix twice a day. Charge nurse and RNCM made aware.  ADDENDUM 4:35pm: Pt daughter asked to speak with CSW. Pt family working with [facility] SN[facility] in [place] and hoping for CSW to contact facility for potential placement. CSW agreed to do so but did explain to pt daughter that all facilities do not accept IV lasix. Should pt be stable to dc to facility and in need of IV lasix plan will be for [facility] if [facility] cannot manage. Pt daughter agreeable. CSW left voicemail for [facility] facility.   Valin Massie, LCSWA [phone]

## 2014-12-27 ENCOUNTER — Inpatient Hospital Stay (HOSPITAL_COMMUNITY): Payer: Medicare Other

## 2014-12-27 LAB — BRAIN NATRIURETIC PEPTIDE: B NATRIURETIC PEPTIDE 5: 1099.7 pg/mL — AB (ref 0.0–100.0)

## 2014-12-27 LAB — GLUCOSE, CAPILLARY
GLUCOSE-CAPILLARY: 113 mg/dL — AB (ref 70–99)
GLUCOSE-CAPILLARY: 125 mg/dL — AB (ref 70–99)
Glucose-Capillary: 79 mg/dL (ref 70–99)
Glucose-Capillary: 93 mg/dL (ref 70–99)

## 2014-12-27 LAB — BASIC METABOLIC PANEL
Anion gap: 11 (ref 5–15)
BUN: 47 mg/dL — ABNORMAL HIGH (ref 6–23)
CHLORIDE: 100 mmol/L (ref 96–112)
CO2: 30 mmol/L (ref 19–32)
Calcium: 8.9 mg/dL (ref 8.4–10.5)
Creatinine, Ser: 1.39 mg/dL — ABNORMAL HIGH (ref 0.50–1.10)
GFR calc Af Amer: 40 mL/min — ABNORMAL LOW (ref >90)
GFR calc non Af Amer: 35 mL/min — ABNORMAL LOW (ref >90)
Glucose, Bld: 85 mg/dL (ref 70–99)
Potassium: 3.1 mmol/L — ABNORMAL LOW (ref 3.5–5.1)
SODIUM: 141 mmol/L (ref 135–145)

## 2014-12-27 LAB — POTASSIUM: Potassium: 3.8 mmol/L (ref 3.5–5.1)

## 2014-12-27 MED ORDER — DOCUSATE SODIUM 100 MG PO CAPS: 100.0000 mg | ORAL_CAPSULE | Freq: Two times a day (BID) | ORAL | Status: AC

## 2014-12-27 MED ORDER — TORSEMIDE 20 MG PO TABS: 40.0000 mg | ORAL_TABLET | Freq: Two times a day (BID) | ORAL | Status: DC

## 2014-12-27 MED ORDER — OXYCODONE HCL 5 MG PO CAPS: 5.0000 mg | ORAL_CAPSULE | Freq: Four times a day (QID) | ORAL | Status: AC | PRN

## 2014-12-27 MED ORDER — POTASSIUM CHLORIDE CRYS ER 20 MEQ PO TBCR
40.0000 meq | EXTENDED_RELEASE_TABLET | Freq: Every day | ORAL | Status: AC
Start: 2014-12-27 — End: ?

## 2014-12-27 MED ORDER — PANTOPRAZOLE SODIUM 40 MG PO TBEC
40.0000 mg | DELAYED_RELEASE_TABLET | Freq: Every day | ORAL | Status: AC
Start: 2014-12-27 — End: ?

## 2014-12-27 MED ORDER — ROPINIROLE HCL 0.25 MG PO TABS: 0.2500 mg | ORAL_TABLET | Freq: Three times a day (TID) | ORAL | Status: DC | PRN

## 2014-12-27 MED ORDER — LEVOTHYROXINE SODIUM 25 MCG PO TABS: 25.0000 ug | ORAL_TABLET | Freq: Every day | ORAL | Status: AC

## 2014-12-27 MED ORDER — POTASSIUM CHLORIDE CRYS ER 20 MEQ PO TBCR
40.0000 meq | EXTENDED_RELEASE_TABLET | Freq: Once | ORAL | Status: AC
  Administered 2014-12-27: 40 meq via ORAL
  Filled 2014-12-27: qty 2

## 2014-12-27 MED ORDER — TORSEMIDE 20 MG PO TABS
40.0000 mg | ORAL_TABLET | Freq: Two times a day (BID) | ORAL | Status: DC
  Administered 2014-12-27 – 2014-12-28 (×3): 40 mg via ORAL
  Filled 2014-12-27 (×3): qty 2

## 2014-12-27 NOTE — Progress Notes (Addendum)
Occupational Therapy Treatment Patient Details Name: Melanie Camacho MRN: [id number] DOB: November 02, 1931 Today's Date: 12/27/2014    History of present illness Pt adm with acute on chronic diastolic heart failure. PMH - CAD, NSTEMI, AVR, DM, HTN, CHF, a-fib.   OT comments  Pt progressing. Continue to recommend SNF for rehab.  Follow Up Recommendations  SNF;Supervision/Assistance - 24 hour    Equipment Recommendations  Tub/shower bench;3 in 1 bedside comode    Recommendations for Other Services      Precautions / Restrictions Precautions Precautions: Fall Precaution Comments: very fearful of falling Restrictions Weight Bearing Restrictions: No       Mobility Bed Mobility               General bed mobility comments: not assessed  Transfers Overall transfer level: Needs assistance Equipment used: 2 person hand held assist Transfers: Sit to/from Stand Sit to Stand: +2 physical assistance;Mod assist         General transfer comment: attempted standing without second person, but unable to do so. Cues for technique.        ADL Overall ADL's : Needs assistance/impaired Eating/Feeding: Set up;Sitting Eating/Feeding Details (indicate cue type and reason): opened water for pt as she asked due to weakness; did not observe her feeding/drinking Grooming: Wash/dry hands;Set up;Supervision/safety;Standing               Lower Body Dressing: Total assistance;Sitting/lateral leans (socks)   Toilet Transfer: +2 for physical assistance;Moderate assistance;Min guard;Ambulation;RW (+2 Mod assist for sit to stand from chair; Min guard for ambulation)           Functional mobility during ADLs: Min guard;Rolling walker General ADL Comments: Discussed AE including toilet aide/wipes and what pt could use for this at home. Educated on safety such on use of bag on walker.       Vision                     Perception     Praxis      Cognition   Awake/Alert Behavior During Therapy: WFL for tasks assessed/performed Overall Cognitive Status: Within Functional Limits for tasks assessed                       Extremity/Trunk Assessment               Exercises     Shoulder Instructions       General Comments      Pertinent Vitals/ Pain       Pain Assessment: 0-10 Pain Score: 4  Pain Location: bilateral arms Pain Descriptors / Indicators: Aching Pain Intervention(s): Monitored during session  Home Living                                          Prior Functioning/Environment              Frequency Min 2X/week     Progress Toward Goals  OT Goals(current goals can now be found in the care plan section)  Progress towards OT goals: Progressing toward goals  Acute Rehab OT Goals Patient Stated Goal: to get out of hospital; and see how far she can go with family history OT Goal Formulation: With patient/family Time For Goal Achievement: 12/30/14 Potential to Achieve Goals: Good ADL Goals Pt Will Perform Grooming: with min assist;standing Pt Will Transfer to Toilet:  with min guard assist;stand pivot transfer;bedside commode Additional ADL Goal #1: Pt will perform bed mobility with supervision to prepare for ADLs.  Additional ADL Goal #2: Pt will Independently demonstrate pursed lip breathing technique when dyspneic during ADLs.   Plan Discharge plan remains appropriate    Co-evaluation                 End of Session Equipment Utilized During Treatment: Gait belt;Rolling walker   Activity Tolerance Patient tolerated treatment well   Patient Left in chair;with call bell/phone within reach   Nurse Communication          Time: 1204-1221 OT Time Calculation (min): 17 min  Charges: OT General Charges $OT Visit: 1 Procedure OT Treatments $Self Care/Home Management : 8-22 mins   Earlie Raveling OTR/L [phone] 12/27/2014, 12:33 PM

## 2014-12-27 NOTE — Progress Notes (Signed)
CSW (Clinical Child psychotherapist) notified by MD of plan for [employer]. CSW notified facility who confirmed they will be able to accept pt tomorrow. Pt and pt daughter updated on plan.  Cuba Natarajan, LCSWA [phone]

## 2014-12-27 NOTE — Progress Notes (Signed)
CSW (Clinical Child psychotherapist) has spoken with Charolette Child at [facility] facility in [place] multiple times today. CSW confirmed they can accept pt today. CSW spoke with pt daughter Arline Asp who is glad that preferred facility is able to accept pt. Cindy plans to provide transportation. Facility to contact pt other daughter who lives locally to complete paperwork. CSW awaiting dc summary to complete dc packet and fax to facility.   Isola Mehlman, LCSWA [phone]

## 2014-12-27 NOTE — Progress Notes (Signed)
Subjective: Feels like she can take a deeper breath today.    Objective: Vital signs in last 24 hours: Temp:  [97.5 F (36.4 C)-97.8 F (36.6 C)] 97.8 F (36.6 C) (04/19 0500) Pulse Rate:  [76-94] 94 (04/19 0500) Resp:  [18-20] 18 (04/19 0500) BP: (101-102)/(49-55) 101/49 mmHg (04/19 0500) SpO2:  [94 %-96 %] 94 % (04/19 0500) Weight:  [223 lb 6.4 oz (101.334 kg)-234 lb 5.6 oz (106.3 kg)] 223 lb 6.4 oz (101.334 kg) (04/19 0500) Last BM Date: 12/26/14  Intake/Output from previous day: 04/18 0701 - 04/19 0700 In: 520 [P.O.:520] Out: 2800 [Urine:2800] Intake/Output this shift: Total I/O In: 356 [P.O.:356] Out: -   Medications Current Facility-Administered Medications  Medication Dose Route Frequency Provider Last Rate Last Dose  . 0.9 %  sodium chloride infusion  250 mL Intravenous PRN Stephani Police, PA-C      . acetaminophen (TYLENOL) tablet 650 mg  650 mg Oral Q4H PRN Stephani Police, PA-C   650 mg at 12/27/14 0813  . ALPRAZolam Prudy Feeler) tablet 0.25 mg  0.25 mg Oral QHS PRN Abelino Derrick, PA-C   0.25 mg at 12/23/14 2237  . apixaban (ELIQUIS) tablet 5 mg  5 mg Oral BID Stephani Police, PA-C   5 mg at 12/27/14 0813  . bisoprolol (ZEBETA) tablet 5 mg  5 mg Oral Daily Stephani Police, PA-C   5 mg at 12/27/14 0813  . colesevelam [facility]) tablet 1,875 mg  1,875 mg Oral BID WC Stephani Police, PA-C   1,875 mg at 12/27/14 7564  . cyclobenzaprine (FLEXERIL) tablet 5 mg  5 mg Oral TID PRN Rodolph Bong, MD      . docusate sodium (COLACE) capsule 100 mg  100 mg Oral BID Rodolph Bong, MD   100 mg at 12/24/14 0923  . furosemide (LASIX) injection 80 mg  80 mg Intravenous Q8H Dwana Melena, PA-C   80 mg at 12/27/14 3329  . gabapentin (NEURONTIN) capsule 300 mg  300 mg Oral BID Stephani Police, PA-C   300 mg at 12/27/14 0813  . gi cocktail (Maalox,Lidocaine,Donnatal)  30 mL Oral BID PRN Stephani Police, PA-C   30 mL at 12/14/14 1640  . insulin aspart (novoLOG) injection 0-9  Units  0-9 Units Subcutaneous TID WC Rodolph Bong, MD   1 Units at 12/15/14 1735  . levothyroxine (SYNTHROID, LEVOTHROID) tablet 25 mcg  25 mcg Oral QAC breakfast Rodolph Bong, MD   25 mcg at 12/27/14 0817  . metolazone (ZAROXOLYN) tablet 2.5 mg  2.5 mg Oral Daily Peter M Sw[place], MD   2.5 mg at 12/27/14 1002  . multivitamin with minerals tablet 1 tablet  1 tablet Oral Daily Stephani Police, PA-C   1 tablet at 12/27/14 0813  . multivitamin-lutein (OCUVITE-LUTEIN) capsule 2 capsule  2 capsule Oral Daily Stephani Police, PA-C   2 capsule at 12/27/14 0813  . nystatin cream (MYCOSTATIN)   Topical BID Stephani Police, PA-C      . ondansetron [facility]) injection 4 mg  4 mg Intravenous Q6H PRN Stephani Police, PA-C      . oxycodone (OXY-IR) immediate release capsule 5 mg  5 mg Oral Q6H PRN Stephani Police, PA-C      . pantoprazole (PROTONIX) EC tablet 40 mg  40 mg Oral QHS Rodolph Bong, MD   40 mg at 12/26/14 2226  . potassium chloride SA (K-DUR,KLOR-CON) CR tablet 40 mEq  40 mEq Oral Daily Rodolph Bong, MD   40 mEq at 12/27/14 1610  . potassium chloride SA (K-DUR,KLOR-CON) CR tablet 40 mEq  40 mEq Oral Once Kathlen Mody, MD      . rOPINIRole (REQUIP) tablet 0.25 mg  0.25 mg Oral TID PRN Laurey Morale, MD   0.25 mg at 12/21/14 2220  . sodium chloride 0.9 % injection 3 mL  3 mL Intravenous Q12H Stephani Police, PA-C   3 mL at 12/27/14 0817  . sodium chloride 0.9 % injection 3 mL  3 mL Intravenous PRN Stephani Police, PA-C   3 mL at 12/25/14 2304    PE: General appearance: alert, cooperative and no distress Lungs: Decreased BS and crackles on the right.  Improved since yesterday Heart: irregularly irregular rhythm Extremities: very tense pitting LEE.  A little softer below the knee.  Pulses: 2+ and symmetric Skin: Warm and dry Neurologic: Grossly normal  Lab Results:  No results for input(s): WBC, HGB, HCT, PLT in the last 72 hours. BMET  Recent Labs  12/25/14 0434  12/27/14 0449  NA 137 141  K 3.9 3.1*  CL 100 100  CO2 28 30  GLUCOSE 85 85  BUN 37* 47*  CREATININE 1.23* 1.39*  CALCIUM 8.7 8.9   BNP (last 3 results)  Recent Labs  12/14/14 1410 12/27/14 0449  BNP 1222.9* 1099.7*    ProBNP (last 3 results) No results for input(s): PROBNP in the last 8760 hours.  Assessment/Plan   Principal Problem:   Acute on chronic diastolic heart failure Active Problems:   Chronic a-fib   S/P AVR-(tissue) 2011 at [facility] - no CABG with this   HTN- (transient hypotension during recent admission)   Hypothyroid   Obesity- BMI 45   DM (diabetes mellitus)   Left sided chest pain   Type 2 diabetes mellitus without complication   Congestive heart disease  Acute on chronic diastolic heart failure Net fluids: -2.3L/-20.5L. PO was not chart over the weekend.   Her legs were better on Friday when I saw her. Albumin level not helping. Gave another 2.5 mg of last night and three doses of lasix.  SCr increased today 1.23 to 1.39.  May be intravascularly dry now.  Repeat CXR today.  Right lung sounds like it is moving more air today.   I think she needs to be changed over to po diuretic and continue a SNF.  She was on lasix 40-60 mg PRN at home.  I think trying torsemide  BID and see how she does. DC to SNF in 24-48 hours.     Chronic a-fib Rate stable. On eliquis.  S/P AVR-(tissue) 2011 at [facility] - no CABG with this  HTN- (transient hypotension during recent admission) Some hypotension.   Hypothyroid Synthroid  Obesity- BMI 45  Type 2 diabetes mellitus without complication   Hypokalemia.  replace   LOS: 13 days    HAGER, BRYAN PA-C 12/27/2014 12:29 PM  Patient seen and examined. Agree with assessment and plan. Breathing better. I/O -20,180; LE edema persis but upper are softer and less tense. F/U CXR. BNP better but still elevated 1099.    Lennette Bihari, MD, [facility] 12/27/2014 1:11 PM

## 2014-12-27 NOTE — Discharge Summary (Addendum)
Physician Discharge Summary  Melanie Camacho PRF:[id number] DOB: 24-Dec-1931 DOA: 12/14/2014  PCP: PROVIDER NOT IN SYSTEM  Admit date: 12/14/2014 Discharge date: 12/30/2014  Time spent: 30 minutes  Recommendations for Outpatient Follow-up:  Follow up with BMP in one week Follow up with cardiology  As recommended Follow up with physical therapy at the SNF  Discharge Diagnoses:  Principal Problem:   Acute on chronic diastolic heart failure Active Problems:   Chronic a-fib   S/P AVR-(tissue) 2011 at [facility] - no CABG with this   HTN- (transient hypotension during recent admission)   Hypothyroid   Obesity- BMI 45   DM (diabetes mellitus)   Left sided chest pain   Type 2 diabetes mellitus without complication   CKD (chronic kidney disease), stage III   Acute on chronic diastolic CHF (congestive heart failure), NYHA class 1   Venous insufficiency (chronic) (peripheral)   Discharge Condition: improved  Diet recommendation: low sodium diet  Filed Weights   12/28/14 0539 12/29/14 0355 12/30/14 0100  Weight: 105.098 kg (231 lb 11.2 oz) 103.329 kg (227 lb 12.8 oz) 104.599 kg (230 lb 9.6 oz)    History of present illness:  81 year old lady admitted for SOB. She was being treated for acute on chronic diastolic heart failure.   Hospital Course:   #1 acute on chronic diastolic CHF exacerbation Improving. Cardiac enzymes minimally elevated however seems to have plateaued. BNP was elevated on admission at 1222.9. 2-D echo with EF of 55-60% with no wall motion abnormalities. Aortic valve of the prosthesis was present with normal range of motion no evidence of dehiscence. Continue current dose of IV Lasix, zebeta, welchol diuresed about 20 liters since admission. Cardiology following and appreciate input and recommendations, added metolazone to the lasix regimen for more diuresis. changed to oral torsemide and watch for 24 hours and plan for discharge in am.  Repeat CXR ordered .   #2 atrial  fibrillation Continue beta blocker for rate control. Continue eliquis for anticoagulation. Adjust Eliquis dose based on Renal function in 7-10 days.  #3 status post AVR 2011 at [facility]. 2-D echo with prosthesis present with normal range of motion and no evidence of dehiscence. Per cardiology.  #4 well-controlled diabetes mellitus diet-controlled Hemoglobin A1c is 5.5. CBGs have ranged from 94-119.     #5. hypothyroidism Patient with prior history of hypothyroidism, however per family patient had been off thyroid medication for approximately 3 years. Patient noted to be volume overloaded with complaints of feeling cold all the time and lethargic. TSH obtained is elevated at 7.734. Free T4 is decreased at 0.75. Free T3 is decreased at 1.6 total T3 was decreased at 66. Continue Synthroid 25 mcg daily. Patient will need repeat thyroid function studies done in about 4-6 weeks as outpatient.    Hypokalemia: replete as needed   Anemia: Macrocytic. Anemia panel will be sent. Showed b12 levels of 470 and normal folate levels. Low iron levels. Iron supplements on discharge   ARF on Stage 2 CKD: - seen by renal, [place] and UA stable, function stable now on reduced diuretic dose, repeat BMP in 3-4 days, adjust Eliquis dose as needed.       Procedures:  none  Consultations:  cardiology  Discharge Exam: Filed Vitals:   12/30/14 0300  BP: 89/53  Pulse: 54  Temp: 97.9 F (36.6 C)  Resp: 16    General: alert afebrile comfortable Cardiovascular: s1s2 Respiratory: clear to auscultation,   Discharge Instructions   Discharge Instructions    (  HEART FAILURE PATIENTS) Call MD:  Anytime you have any of the following symptoms: 1) 3 pound weight gain in 24 hours or 5 pounds in 1 week 2) shortness of breath, with or without a dry hacking cough 3) swelling in the hands, feet or stomach 4) if you have to sleep on extra pillows at night in order to breathe.    Complete by:  As directed       Diet - low sodium heart healthy    Complete by:  As directed      Discharge instructions    Complete by:  As directed   Follow up with PCP in one week Follow up with cardiology as recommended Please check BMP tomorrow and follow potassium and replete as needed.     Discharge instructions    Complete by:  As directed   Follow with Primary MD  in 7 days   Get CBC, CMP, 2 view Chest X ray checked  by  SNF MD next visit.    Activity: As tolerated with Full fall precautions use walker/cane & assistance as needed   Disposition SNF   Diet: Heart Healthy   - Check your Weight same time everyday, if you gain over 2 pounds, or you develop in leg swelling, experience more shortness of breath or chest pain, call your Primary MD immediately. Follow Cardiac Low Salt Diet and 1.5 lit/day fluid restriction.   On your next visit with your primary care physician please Get Medicines reviewed and adjusted.   Please request your Prim.MD to go over all Hospital Tests and Procedure/Radiological results at the follow up, please get all Hospital records sent to your Prim MD by signing hospital release before you go home.   If you experience worsening of your admission symptoms, develop shortness of breath, life threatening emergency, suicidal or homicidal thoughts you must seek medical attention immediately by calling 911 or calling your MD immediately  if symptoms less severe.  You Must read complete instructions/literature along with all the possible adverse reactions/side effects for all the Medicines you take and that have been prescribed to you. Take any new Medicines after you have completely understood and accpet all the possible adverse reactions/side effects.   Do not drive, operating heavy machinery, perform activities at heights, swimming or participation in water activities or provide baby sitting services if your were admitted for syncope or siezures until you have seen by Primary MD or a  Neurologist and advised to do so again.  Do not drive when taking Pain medications.    Do not take more than prescribed Pain, Sleep and Anxiety Medications  Special Instructions: If you have smoked or chewed Tobacco  in the last 2 yrs please stop smoking, stop any regular Alcohol  and or any Recreational drug use.  Wear Seat belts while driving.   Please note  You were cared for by a hospitalist during your hospital stay. If you have any questions about your discharge medications or the care you received while you were in the hospital after you are discharged, you can call the unit and asked to speak with the hospitalist on call if the hospitalist that took care of you is not available. Once you are discharged, your primary care physician will handle any further medical issues. Please note that NO REFILLS for any discharge medications will be authorized once you are discharged, as it is imperative that you return to your primary care physician (or establish a relationship with a primary  care physician if you do not have one) for your aftercare needs so that they can reassess your need for medications and monitor your lab values.        Information on my medicine - ELIQUIS (apixaban)  This medication education was reviewed with me or my healthcare representative as part of my discharge preparation.  The pharmacist that spoke with me during my hospital stay was:  Pasty Spillers, [facility]  Why was Eliquis prescribed for you? Eliquis was prescribed for you to reduce the risk of a blood clot forming that can cause a stroke if you have a medical condition called atrial fibrillation (a type of irregular heartbeat).  What do You need to know about Eliquis ? Take your Eliquis TWICE DAILY - one tablet in the morning and one tablet in the evening with or without food. If you have difficulty swallowing the tablet whole please discuss with your pharmacist how to take the medication  safely.  Take Eliquis exactly as prescribed by your doctor and DO NOT stop taking Eliquis without talking to the doctor who prescribed the medication.  Stopping may increase your risk of developing a stroke.  Refill your prescription before you run out.  After discharge, you should have regular check-up appointments with your healthcare provider that is prescribing your Eliquis.  In the future your dose may need to be changed if your kidney function or weight changes by a significant amount or as you get older.  What do you do if you miss a dose? If you miss a dose, take it as soon as you remember on the same day and resume taking twice daily.  Do not take more than one dose of ELIQUIS at the same time to make up a missed dose.  Important Safety Information A possible side effect of Eliquis is bleeding. You should call your healthcare provider right away if you experience any of the following: Bleeding from an injury or your nose that does not stop. Unusual colored urine (red or dark brown) or unusual colored stools (red or black). Unusual bruising for unknown reasons. A serious fall or if you hit your head (even if there is no bleeding).  Some medicines may interact with Eliquis and might increase your risk of bleeding or clotting while on Eliquis. To help avoid this, consult your healthcare provider or pharmacist prior to using any new prescription or non-prescription medications, including herbals, vitamins, non-steroidal anti-inflammatory drugs (NSAIDs) and supplements.  This website has more information on Eliquis (apixaban): http://www.eliquis.com/eliquis/home     Increase activity slowly    Complete by:  As directed           Current Discharge Medication List    START taking these medications   Details  docusate sodium (COLACE) 100 MG capsule Take 1 capsule (100 mg total) by mouth 2 (two) times daily. Qty: 10 capsule, Refills: 0    levothyroxine (SYNTHROID, LEVOTHROID)  25 MCG tablet Take 1 tablet (25 mcg total) by mouth daily before breakfast.    pantoprazole (PROTONIX) 40 MG tablet Take 1 tablet (40 mg total) by mouth at bedtime.    potassium chloride SA (K-DUR,KLOR-CON) 20 MEQ tablet Take 2 tablets (40 mEq total) by mouth daily. Qty: 30 tablet, Refills: 0    torsemide (DEMADEX) 20 MG tablet Take 1 tablet (20 mg total) by mouth daily.      CONTINUE these medications which have CHANGED   Details  apixaban (ELIQUIS) 2.5 MG TABS tablet Take 1 tablet (  2.5 mg total) by mouth 2 (two) times daily.    oxycodone (OXY-IR) 5 MG capsule Take 1 capsule (5 mg total) by mouth every 6 (six) hours as needed for pain. Qty: 10 capsule, Refills: 0    rOPINIRole (REQUIP) 0.25 MG tablet Take 1 tablet (0.25 mg total) by mouth 3 (three) times daily as needed (restless leg syndrome).      CONTINUE these medications which have NOT CHANGED   Details  bisoprolol (ZEBETA) 5 MG tablet Take 1 tablet (5 mg total) by mouth daily. Qty: 30 tablet, Refills: 11    Cholecalciferol (VITAMIN D3) 5000 UNITS CAPS Take 5,000 Units by mouth daily.    colesevelam (WELCHOL) 625 MG tablet Take 1,875 mg by mouth 2 (two) times daily with a meal.    gabapentin (NEURONTIN) 300 MG capsule Take 300 mg by mouth 2 (two) times daily.    IRON PO Take 1 tablet by mouth daily. 65mg     Multiple Vitamins-Minerals (ICAPS) CAPS Take 2 capsules by mouth daily.       STOP taking these medications     furosemide (LASIX) 40 MG tablet      Multiple Vitamins-Minerals (MULTIVITAMIN WITH MINERALS) tablet      potassium chloride (K-DUR) 10 MEQ tablet        Allergies  Allergen Reactions  . Morphine And Related     Stevens-Johnsons  . Tape   . Bacitracin Rash  . Betadine [Povidone Iodine] Rash  . Clindamycin/Lincomycin Rash  . Vancomycin Rash   Follow-up Information    Follow up with SIMMONS, BRITTAINY, PA-C.   Specialty:  Cardiology   Why:  [facility] Office - 01/04/15 at [facility] information:   [address] [place] [address] [phone]        The results of significant diagnostics from this hospitalization (including imaging, microbiology, ancillary and laboratory) are listed below for reference.    Significant Diagnostic Studies: Dg Chest 2 View  12/27/2014   CLINICAL DATA:  Chest pain for 1 day.  EXAM: CHEST  2 VIEW  COMPARISON:  12/20/2014.  FINDINGS: Stable enlarged cardiac silhouette. No significant change in bilateral pleural effusions. Post CABG changes are again demonstrated. Evidence of bilateral chronic rotator cuff tears. Left shoulder degenerative changes.  IMPRESSION: Stable cardiomegaly and bilateral pleural effusions.   Electronically Signed   By: Beckie Salts M.D.   On: 12/27/2014 15:47   Dg Chest 2 View  12/20/2014   CLINICAL DATA:  Congestive failure  EXAM: CHEST  2 VIEW  COMPARISON:  12/15/2014  FINDINGS: The cardiac shadow remains enlarged. A right-sided pleural effusion and right basilar atelectasis is again identified. Postsurgical changes are seen. The left lung shows a small effusion but stable. No acute bony abnormality is seen.  IMPRESSION: Bilateral effusions right greater than left. Right basilar atelectasis is noted as well.   Electronically Signed   By: Alcide Clever M.D.   On: 12/20/2014 12:16   Dg Chest 2 View  12/15/2014   CLINICAL DATA:  Shortness of breath, history of aortic valve replacement an acute and chronic CHF  EXAM: CHEST  2 VIEW  COMPARISON:  Portable chest x-ray of December 14, 2014  FINDINGS: The left lung is well-expanded. There is a small left pleural effusion. On the right there is a moderate-sized effusion with basilar atelectasis. The pulmonary interstitial markings remain mildly increased. The cardiac silhouette remains enlarged. There is tortuosity of the descending thoracic aorta.  IMPRESSION: COPD with superimposed CHF. Slight interval improvement in the appearance of the pulmonary interstitium has  occurred since yesterday's study. There is persistent pleural fluid and basilar atelectasis on the right.   Electronically Signed   By: David  [place]   On: 12/15/2014 08:07   US Renal  12/29/2014   CLINICAL DATA:  Acute renal failure  EXAM: RENAL/URINARY TRACT ULTRASOUND COMPLETE  COMPARISON:  None.  FINDINGS: Right Kidney:  Length: 9.1 cm. Diffuse cortical thinning is noted. No focal mass lesion or hydronephrosis is noted.  Left Kidney:  Length: 10.2 cm. Echogenicity within normal limits. No mass or hydronephrosis visualized.  Bladder:  Appears normal for degree of bladder distention.  A 3.2 cm hepatic cyst is noted. Small bilateral pleural effusions are noted.  IMPRESSION: Mild cortical thinning in the right kidney.  No mass lesion or hydronephrosis is noted.  Small bilateral pleural effusions.  Hepatic cyst.   Electronically Signed   By: Alcide Clever M.D.   On: 12/29/2014 11:28   Dg Chest Port 1 View  12/14/2014   CLINICAL DATA:  Shortness of Breath  EXAM: PORTABLE CHEST - 1 VIEW  COMPARISON:  None.  FINDINGS: There is cardiomegaly with pulmonary venous hypertension. There is a right pleural effusion with airspace consolidation in the right base. There is mild interstitial edema. No adenopathy. Bones are osteoporotic. Patient is status post median sternotomy.  IMPRESSION: Evidence of congestive heart failure. Cannot exclude superimposed pneumonia in the right base.   Electronically Signed   By: Bretta Bang III M.D.   On: 12/14/2014 14:37    Microbiology: No results found for this or any previous visit (from the past 240 hour(s)).   Labs: Basic Metabolic Panel:  Recent Labs Lab 12/25/14 0434 12/27/14 0449 12/27/14 2025 12/28/14 0620 12/29/14 0544 12/30/14 0650  NA 137 141  --  141 143 141  K 3.9 3.1* 3.8 3.4* 3.4* 3.9  CL 100 100  --  100 101 101  CO2 28 30  --  GLUCOSE 85 85  --  88 89 84  BUN 37* 47*  --  49* 51* 53*  CREATININE 1.23* 1.39*  --  1.49* 1.58* 1.54*   CALCIUM 8.7 8.9  --  8.9 8.9 9.1   Liver Function Tests: No results for input(s): AST, ALT, ALKPHOS, BILITOT, PROT, ALBUMIN in the last 168 hours. No results for input(s): LIPASE, AMYLASE in the last 168 hours. No results for input(s): AMMONIA in the last 168 hours. CBC: No results for input(s): WBC, NEUTROABS, HGB, HCT, MCV, PLT in the last 168 hours. Cardiac Enzymes: No results for input(s): CKTOTAL, CKMB, CKMBINDEX, TROPONINI in the last 168 hours. BNP: BNP (last 3 results)  Recent Labs  12/14/14 1410 12/27/14 0449  BNP 1222.9* 1099.7*    ProBNP (last 3 results) No results for input(s): PROBNP in the last 8760 hours.  CBG:  Recent Labs Lab 12/29/14 0741 12/29/14 1138 12/29/14 1658 12/29/14 2033 12/30/14 0730  GLUCAP 89 111* 119* 100* 80       Signed:  SINGH,PRASHANT K  [facility] 12/30/2014, 9:04 AM

## 2014-12-27 NOTE — Progress Notes (Signed)
Speech Language Pathology Treatment: Dysphagia  Patient Details Name: Melanie Camacho MRN: [id number] DOB: [birth year] Today's Date: 12/27/2014 Time: 7867-5449 SLP Time Calculation (min) (ACUTE ONLY): 12 min  Assessment / Plan / Recommendation Clinical Impression  F/u after 4/17 swallow evaluation.  Pt reports decreased symptoms of esophageal issues (globus) with improved transitioning of POs.  Reviewed basic esophageal precautions.  Pt describes vocal changes/hoarseness and increased post-nasal drip; rec f/u with ENT as OP should symptoms persist.  Able to discuss strategies to facilitate better esophageal clearance.  No SLP f/u is needed - will sign off.  Pt agrees.   HPI HPI: Melanie Camacho is a 81 y.o. female, with a past medical history of A. Fib, coronary artery disease with NSTEMI on 12/22/2013, aortic valve replacement (porcine tissue), diet controlled diabetes, hypertension, and hypothyroidism. Pt admitted on 12-14-14 with CHF exacerbation and hypoxia. Pt with recent onset of change in swallow function with complaints of  "coughing during meals and food getting stuck".    Pertinent Vitals Pain Assessment: No/denies pain  SLP Plan  All goals met    Recommendations Diet recommendations: Regular;Thin liquid Liquids provided via: Cup;Straw Medication Administration: Whole meds with liquid Supervision: Patient able to self feed Compensations: Follow solids with liquid;Slow rate;Small sips/bites;Effortful swallow Postural Changes and/or Swallow Maneuvers: Seated upright 90 degrees              Oral Care Recommendations: Oral care BID Plan: All goals met   Jinnifer Montejano L. Tivis Ringer, [place] CCC/SLP Pager [phone]      Juan Quam Laurice 12/27/2014, 9:52 AM

## 2014-12-27 NOTE — Progress Notes (Signed)
[facility] PROGRESS NOTE  Melanie Camacho ZOX:[id number] DOB: 23-Jan-1932 DOA: 12/14/2014 PCP: PROVIDER NOT IN SYSTEM Interim summary: 81 year old lady admitted for SOB. She was being treated for acute on chronic diastolic heart failure.  Assessment/Plan: #1 acute on chronic diastolic CHF exacerbation Improving. Cardiac enzymes minimally elevated however seems to have plateaued. BNP was elevated on admission at 1222.9. 2-D echo with EF of 55-60% with no wall motion abnormalities. Aortic valve of the prosthesis was present with normal range of motion no evidence of dehiscence.  Continue current dose of IV Lasix, zebeta, welchol diuresed about 20 liters since admission.  Cardiology following and appreciate input and recommendations, added metolazone to the lasix regimen for more diuresis. Changed to oral torsemide and watch for 24 hours more for diuresis. CXR ordered and done.   #2 atrial fibrillation Continue beta blocker for rate control. Continue eliquis for anticoagulation.  #3 status post AVR 2011 at [facility]. 2-D echo with prosthesis present with normal range of motion and no evidence of dehiscence. Per cardiology.  #4 well-controlled diabetes mellitus diet-controlled Hemoglobin A1c is 5.5. CBGs have ranged from 94-119. Sliding scale insulin. CBG (last 3)   Recent Labs  12/26/14 2103 12/27/14 0745 12/27/14 1109  GLUCAP 111* 79 113*      #5 hypothyroidism Patient with prior history of hypothyroidism, however per family patient had been off thyroid medication for approximately 3 years. Patient noted to be volume overloaded with complaints of feeling cold all the time and lethargic. TSH obtained is elevated at 7.734. Free T4 is decreased at 0.75. Free T3 is decreased at 1.6 total T3 was decreased at 66. Continue Synthroid 25 mcg daily. Patient will need repeat thyroid function studies done in about 4-6 weeks as outpatient.    Mild acute on CKD. : Probably from diuresis.  Monitor and replete potassium as needed. Her creatinine slightly worse than yesterday, repeat BMP in am.  Hypokalemia: replete as needed. Repeat in am.    Anemia: Macrocytic. Anemia panel will be sent. Showed b12 levels of 470 and normal folate levels. Low iron levels. Iron supplements on discharge     Code Status: Full Family Communication: Updated patient , none at bedside.  Disposition Plan: discharge in am.    Consultants:  Cardiology: Dr. Allyson Sabal 12/14/2014  Procedures:  Chest x-ray 12/15/2014, 12/14/2014  2-D echo 12/15/2014    Antibiotics:  None  HPI/Subjective: Feels good today. Breathing better no new complaints.   Objective: Filed Vitals:   12/27/14 0500  BP: 101/49  Pulse: 94  Temp: 97.8 F (36.6 C)  Resp: 18    Intake/Output Summary (Last 24 hours) at 12/27/14 1529 Last data filed at 12/27/14 0857  Gross per 24 hour  Intake    516 ml  Output   2100 ml  Net  -1584 ml   Filed Weights   12/26/14 0500 12/26/14 1520 12/27/14 0500  Weight: 107.049 kg (236 lb) 106.3 kg (234 lb 5.6 oz) 101.334 kg (223 lb 6.4 oz)    Exam:   General:  NAD.   Cardiovascular: Irregularly irregular,  Respiratory: air entry fair, no wheezing or rhonchi, no rales hears  Abdomen: Soft, positive bowel sounds, nontender to palpation, lower abdominal wall edema.  Musculoskeletal: No clubbing cyanosis. 2 + edema upto the knees, scabs over the legs.   Data Reviewed: Basic Metabolic Panel:  Recent Labs Lab 12/22/14 0607 12/23/14 0436 12/24/14 0620 12/25/14 0434 12/27/14 0449  NA 140 140 141 137 141  K 4.1  3.8 4.3 3.9 3.1*  CL 101 102 101 100 100  CO2 29 28 30 28 30   GLUCOSE 89 85 79 85 85  BUN 37* 35* 37* 37* 47*  CREATININE 1.27* 1.27* 1.30* 1.23* 1.39*  CALCIUM 8.9 8.6 8.9 8.7 8.9   Liver Function Tests:  Recent Labs Lab 12/21/14 0517 12/22/14 0607  AST 30 28  ALT 14 13  ALKPHOS 63 67  BILITOT 0.7 0.7  PROT 6.5 7.0  ALBUMIN 2.7* 2.9*   No  results for input(s): LIPASE, AMYLASE in the last 168 hours. No results for input(s): AMMONIA in the last 168 hours. CBC:  Recent Labs Lab 12/21/14 0517  WBC 3.5*  HGB 10.4*  HCT 34.1*  MCV 100.6*  PLT 172   Cardiac Enzymes: No results for input(s): CKTOTAL, CKMB, CKMBINDEX, TROPONINI in the last 168 hours. BNP (last 3 results)  Recent Labs  12/14/14 1410 12/27/14 0449  BNP 1222.9* 1099.7*    ProBNP (last 3 results) No results for input(s): PROBNP in the last 8760 hours.  CBG:  Recent Labs Lab 12/26/14 1154 12/26/14 1655 12/26/14 2103 12/27/14 0745 12/27/14 1109  GLUCAP 107* 95 111* 79 113*    No results found for this or any previous visit (from the past 240 hour(s)).   Studies: No results found.  Scheduled Meds: . apixaban  5 mg Oral BID  . bisoprolol  5 mg Oral Daily  . colesevelam  1,875 mg Oral BID WC  . docusate sodium  100 mg Oral BID  . gabapentin  300 mg Oral BID  . insulin aspart  0-9 Units Subcutaneous TID WC  . levothyroxine  25 mcg Oral QAC breakfast  . metolazone  2.5 mg Oral Daily  . multivitamin with minerals  1 tablet Oral Daily  . multivitamin-lutein  2 capsule Oral Daily  . nystatin cream   Topical BID  . pantoprazole  40 mg Oral QHS  . potassium chloride  40 mEq Oral Daily  . potassium chloride  40 mEq Oral Once  . sodium chloride  3 mL Intravenous Q12H  . torsemide  40 mg Oral BID   Continuous Infusions:   Principal Problem:   Acute on chronic diastolic heart failure Active Problems:   Chronic a-fib   S/P AVR-(tissue) 2011 at [facility] - no CABG with this   HTN- (transient hypotension during recent admission)   Hypothyroid   Obesity- BMI 45   DM (diabetes mellitus)   Left sided chest pain   Type 2 diabetes mellitus without complication   Congestive heart disease    Time spent: 25 minutes    Dimitri Shakespeare M.D. [facility] Pager [phone]  If 7PM-7AM, please contact night-coverage at [email or web address], password  [facility] 12/27/2014, 3:29 PM  LOS: 13 days

## 2014-12-28 ENCOUNTER — Telehealth: Payer: Self-pay | Admitting: Cardiology

## 2014-12-28 DIAGNOSIS — I872 Venous insufficiency (chronic) (peripheral): Secondary | ICD-10-CM | POA: Insufficient documentation

## 2014-12-28 DIAGNOSIS — N183 Chronic kidney disease, stage 3 unspecified: Secondary | ICD-10-CM | POA: Insufficient documentation

## 2014-12-28 DIAGNOSIS — I5033 Acute on chronic diastolic (congestive) heart failure: Secondary | ICD-10-CM | POA: Insufficient documentation

## 2014-12-28 LAB — GLUCOSE, CAPILLARY
GLUCOSE-CAPILLARY: 116 mg/dL — AB (ref 70–99)
Glucose-Capillary: 91 mg/dL (ref 70–99)
Glucose-Capillary: 99 mg/dL (ref 70–99)
Glucose-Capillary: 109 mg/dL — ABNORMAL HIGH (ref 70–99)

## 2014-12-28 LAB — BASIC METABOLIC PANEL WITH GFR
Anion gap: 12 (ref 5–15)
BUN: 49 mg/dL — ABNORMAL HIGH (ref 6–23)
CO2: 29 mmol/L (ref 19–32)
Calcium: 8.9 mg/dL (ref 8.4–10.5)
Chloride: 100 mmol/L (ref 96–112)
Creatinine, Ser: 1.49 mg/dL — ABNORMAL HIGH (ref 0.50–1.10)
GFR calc Af Amer: 37 mL/min — ABNORMAL LOW
GFR calc non Af Amer: 32 mL/min — ABNORMAL LOW
Glucose, Bld: 88 mg/dL (ref 70–99)
Potassium: 3.4 mmol/L — ABNORMAL LOW (ref 3.5–5.1)
Sodium: 141 mmol/L (ref 135–145)

## 2014-12-28 MED ORDER — POTASSIUM CHLORIDE CRYS ER 20 MEQ PO TBCR
40.0000 meq | EXTENDED_RELEASE_TABLET | Freq: Once | ORAL | Status: AC
  Administered 2014-12-28: 40 meq via ORAL
  Filled 2014-12-28: qty 2

## 2014-12-28 NOTE — Telephone Encounter (Signed)
New message      TCM appt on 01-04-15 per [facility]

## 2014-12-28 NOTE — Progress Notes (Signed)
[facility] PROGRESS NOTE  Melanie Camacho ZOX:[id number] DOB: 04/02/1932 DOA: 12/14/2014 PCP: PROVIDER NOT IN SYSTEM Interim summary: 81 year old lady admitted for SOB. She was being treated for acute on chronic diastolic heart failure.    Assessment/Plan:  #1 Acute on chronic diastolic CHF exacerbation EF 55%  Cardiology on board, now switched to oral Demadex twice a day, is 21 L negative, still has 2+ edema in both lower extremities, discussed with cardiology, continue diuretics and hold today as creatinine has bumped. Repeat BMP in the morning.  Filed Weights   12/26/14 1520 12/27/14 0500 12/28/14 0539  Weight: 106.3 kg (234 lb 5.6 oz) 101.334 kg (223 lb 6.4 oz) 105.098 kg (231 lb 11.2 oz)     #2 Chr atrial fibrillation CHADS VAsc Score - 3 Continue beta blocker for rate control. Continue eliquis for anticoagulation.   #3 status post Tissue AVR 2011 at [facility]. 2-D echo with prosthesis present with normal range of motion and no evidence of dehiscence. Per cardiology.   #4 well-controlled diabetes mellitus diet-controlled Hemoglobin A1c is 5.5. CBGs have ranged from 94-119. Sliding scale insulin. CBG (last 3)   Recent Labs  12/27/14 2107 12/28/14 0756 12/28/14 1138  GLUCAP 125* 91 99      #5 hypothyroidism Started on Synthroid this admission, repeat TSH in 4-6 weeks. Was off Synthroid for 3 years.    ARF on CKD 3 : Baseline creatinine close to 1.2, currently 1.5. Discussed with cardiology. They will adjust diuretics. Currently has evidence of fluid overload. Potassium replaced.     Anemia: Macrocytic. Anemia panel   Showed b12 levels of 470 and normal folate levels.  This is likely due to anemia of chronic disease with hypothyroidism. Continue Synthroid and monitor.     Code Status: Full Family Communication: Updated patient , none at bedside.  Disposition Plan: discharge was creatinine stable   Consultants:  Cardiology   Procedures:  Chest  x-ray 12/15/2014, 12/14/2014  2-D echo 12/15/2014   - Left ventricle: The cavity size was normal. Wall thickness was normal. Systolic function was normal. The estimated ejection fraction was in the range of 55% to 60%. Wall motion was normal; there were no regional wall motion abnormalities. - Aortic valve: A prosthesis was present. The prosthesis had a normal range of motion. The sewing ring appeared normal, had no rocking motion, and showed no evidence of dehiscence. There was mild stenosis. There was mild regurgitation. Peak velocity (S): 240 cm/s. - Mitral valve: Moderately calcified annulus. Mildly thickened, mildly calcified leaflets . There was mild regurgitation. - Left atrium: The atrium was moderately dilated. - Right ventricle: The cavity size was mildly dilated. Wall thickness was normal. - Right atrium: The atrium was mildly dilated. - Pulmonary arteries: Systolic pressure was mildly increased. PA peak pressure: 31 mm Hg (S). - Pericardium, extracardiac: A trivial pericardial effusion was identified posterior to the heart.    Antibiotics:  None  HPI/Subjective:  Sitting up in chair, no headache, no chest or abdominal pain, no shortness of breath.  Objective: Filed Vitals:   12/28/14 0700  BP:   Pulse: 84  Temp:   Resp:     Intake/Output Summary (Last 24 hours) at 12/28/14 1322 Last data filed at 12/28/14 1100  Gross per 24 hour  Intake    600 ml  Output   2200 ml  Net  -1600 ml   Filed Weights   12/26/14 1520 12/27/14 0500 12/28/14 0539  Weight: 106.3 kg (234 lb  5.6 oz) 101.334 kg (223 lb 6.4 oz) 105.098 kg (231 lb 11.2 oz)    Exam:   General:  NAD.   Cardiovascular: Irregularly irregular,  Respiratory: air entry fair, no wheezing or rhonchi, no rales hears  Abdomen: Soft, positive bowel sounds, nontender to palpation, lower abdominal wall edema.  Musculoskeletal: No clubbing cyanosis. 2 + edema upto the knees, scabs  over the legs.   Data Reviewed: Basic Metabolic Panel:  Recent Labs Lab 12/23/14 0436 12/24/14 0620 12/25/14 0434 12/27/14 0449 12/27/14 2025 12/28/14 0620  NA 140 141 137 141  --  141  K 3.8 4.3 3.9 3.1* 3.8 3.4*  CL 102 101 100 100  --  100  CO2 --  29  GLUCOSE 85 79 85 85  --  88  BUN 35* 37* 37* 47*  --  49*  CREATININE 1.27* 1.30* 1.23* 1.39*  --  1.49*  CALCIUM 8.6 8.9 8.7 8.9  --  8.9   Liver Function Tests:  Recent Labs Lab 12/22/14 0607  AST 28  ALT 13  ALKPHOS 67  BILITOT 0.7  PROT 7.0  ALBUMIN 2.9*   No results for input(s): LIPASE, AMYLASE in the last 168 hours. No results for input(s): AMMONIA in the last 168 hours. CBC: No results for input(s): WBC, NEUTROABS, HGB, HCT, MCV, PLT in the last 168 hours. Cardiac Enzymes: No results for input(s): CKTOTAL, CKMB, CKMBINDEX, TROPONINI in the last 168 hours. BNP (last 3 results)  Recent Labs  12/14/14 1410 12/27/14 0449  BNP 1222.9* 1099.7*    ProBNP (last 3 results) No results for input(s): PROBNP in the last 8760 hours.  CBG:  Recent Labs Lab 12/27/14 1109 12/27/14 1639 12/27/14 2107 12/28/14 0756 12/28/14 1138  GLUCAP 113* 93 125* 91 99    No results found for this or any previous visit (from the past 240 hour(s)).   Studies: Dg Chest 2 View  12/27/2014   CLINICAL DATA:  Chest pain for 1 day.  EXAM: CHEST  2 VIEW  COMPARISON:  12/20/2014.  FINDINGS: Stable enlarged cardiac silhouette. No significant change in bilateral pleural effusions. Post CABG changes are again demonstrated. Evidence of bilateral chronic rotator cuff tears. Left shoulder degenerative changes.  IMPRESSION: Stable cardiomegaly and bilateral pleural effusions.   Electronically Signed   By: Beckie Salts M.D.   On: 12/27/2014 15:47    Scheduled Meds: . apixaban  5 mg Oral BID  . bisoprolol  5 mg Oral Daily  . colesevelam  1,875 mg Oral BID WC  . docusate sodium  100 mg Oral BID  . gabapentin  300 mg Oral  BID  . insulin aspart  0-9 Units Subcutaneous TID WC  . levothyroxine  25 mcg Oral QAC breakfast  . multivitamin with minerals  1 tablet Oral Daily  . multivitamin-lutein  2 capsule Oral Daily  . nystatin cream   Topical BID  . pantoprazole  40 mg Oral QHS  . potassium chloride  40 mEq Oral Daily  . potassium chloride  40 mEq Oral Once  . sodium chloride  3 mL Intravenous Q12H  . torsemide  40 mg Oral BID   Continuous Infusions:   Principal Problem:   Acute on chronic diastolic heart failure Active Problems:   Chronic a-fib   S/P AVR-(tissue) 2011 at [facility] - no CABG with this   HTN- (transient hypotension during recent admission)   Hypothyroid   Obesity- BMI 45   DM (diabetes mellitus)  Left sided chest pain   Type 2 diabetes mellitus without complication   CKD (chronic kidney disease), stage III    Time spent: 25 minutes    Leroy Sea M.D. [facility] Pager [phone]  If 7PM-7AM, please contact night-coverage at [email or web address], password [facility] 12/28/2014, 1:22 PM  LOS: 14 days

## 2014-12-28 NOTE — Progress Notes (Addendum)
Patient: Melanie Camacho / Admit Date: 12/14/2014 / Date of Encounter: 12/28/2014, 8:20 AM   Subjective: Feeling so much better. No SOB. LEE has improved significantly. They do not ever fully go down.   Objective: Telemetry: atrial fib, rates mostly controlled 80-90s (brief 110s earlier this AM) Physical Exam: Blood pressure 107/48, pulse 84, temperature 97.4 F (36.3 C), temperature source Oral, resp. rate 18, height  (1.549 m), weight 231 lb 11.2 oz (105.098 kg), SpO2 91 %. General: Well developed, well nourished WF in no acute distress. Head: Normocephalic, atraumatic, sclera non-icteric, no xanthomas, nares are without discharge. Neck: JVP not elevated. Lungs: Diminished BS at right base 1/3 of the way up. Otherwise clear bilaterally to auscultation without wheezes, rales, or rhonchi. Breathing is unlabored. Heart: Irregularly irregular, rate controlled, systolic click noted over AV area, S1 S2 without murmurs, rubs, or gallops.  Abdomen: Soft, non-tender, non-distended with normoactive bowel sounds. No rebound/guarding. Extremities: No clubbing or cyanosis. 1+ BLE with stasis thickening of skin indicating chronic edema.  Neuro: Alert and oriented X 3. Moves all extremities spontaneously. Psych:  Responds to questions appropriately with a normal affect.   Intake/Output Summary (Last 24 hours) at 12/28/14 0820 Last data filed at 12/28/14 0809  Gross per 24 hour  Intake    716 ml  Output   1400 ml  Net   -684 ml    Inpatient Medications:  . apixaban  5 mg Oral BID  . bisoprolol  5 mg Oral Daily  . colesevelam  1,875 mg Oral BID WC  . docusate sodium  100 mg Oral BID  . gabapentin  300 mg Oral BID  . insulin aspart  0-9 Units Subcutaneous TID WC  . levothyroxine  25 mcg Oral QAC breakfast  . metolazone  2.5 mg Oral Daily  . multivitamin with minerals  1 tablet Oral Daily  . multivitamin-lutein  2 capsule Oral Daily  . nystatin cream   Topical BID  . pantoprazole  40 mg  Oral QHS  . potassium chloride  40 mEq Oral Daily  . sodium chloride  3 mL Intravenous Q12H  . torsemide  40 mg Oral BID   Infusions:    Labs:  Recent Labs  12/27/14 0449 12/27/14 2025 12/28/14 0620  NA 141  --  141  K 3.1* 3.8 3.4*  CL 100  --  100  CO2 30  --  29  GLUCOSE 85  --  88  BUN 47*  --  49*  CREATININE 1.39*  --  1.49*  CALCIUM 8.9  --  8.9   No results for input(s): AST, ALT, ALKPHOS, BILITOT, PROT, ALBUMIN in the last 72 hours. No results for input(s): WBC, NEUTROABS, HGB, HCT, MCV, PLT in the last 72 hours. No results for input(s): CKTOTAL, CKMB, TROPONINI in the last 72 hours. Invalid input(s): POCBNP No results for input(s): HGBA1C in the last 72 hours.   Radiology/Studies:  Dg Chest 2 View  12/27/2014   CLINICAL DATA:  Chest pain for 1 day.  EXAM: CHEST  2 VIEW  COMPARISON:  12/20/2014.  FINDINGS: Stable enlarged cardiac silhouette. No significant change in bilateral pleural effusions. Post CABG changes are again demonstrated. Evidence of bilateral chronic rotator cuff tears. Left shoulder degenerative changes.  IMPRESSION: Stable cardiomegaly and bilateral pleural effusions.   Electronically Signed   By: Beckie Salts M.D.   On: 12/27/2014 15:47   Dg Chest 2 View  12/20/2014   CLINICAL DATA:  Congestive failure  EXAM: CHEST  2 VIEW  COMPARISON:  12/15/2014  FINDINGS: The cardiac shadow remains enlarged. A right-sided pleural effusion and right basilar atelectasis is again identified. Postsurgical changes are seen. The left lung shows a small effusion but stable. No acute bony abnormality is seen.  IMPRESSION: Bilateral effusions right greater than left. Right basilar atelectasis is noted as well.   Electronically Signed   By: Alcide Clever M.D.   On: 12/20/2014 12:16   Dg Chest 2 View  12/15/2014   CLINICAL DATA:  Shortness of breath, history of aortic valve replacement an acute and chronic CHF  EXAM: CHEST  2 VIEW  COMPARISON:  Portable chest x-ray of December 14, 2014  FINDINGS: The left lung is well-expanded. There is a small left pleural effusion. On the right there is a moderate-sized effusion with basilar atelectasis. The pulmonary interstitial markings remain mildly increased. The cardiac silhouette remains enlarged. There is tortuosity of the descending thoracic aorta.  IMPRESSION: COPD with superimposed CHF. Slight interval improvement in the appearance of the pulmonary interstitium has occurred since yesterday's study. There is persistent pleural fluid and basilar atelectasis on the right.   Electronically Signed   By: David  [place]   On: 12/15/2014 08:07   Dg Chest Port 1 View  12/14/2014   CLINICAL DATA:  Shortness of Breath  EXAM: PORTABLE CHEST - 1 VIEW  COMPARISON:  None.  FINDINGS: There is cardiomegaly with pulmonary venous hypertension. There is a right pleural effusion with airspace consolidation in the right base. There is mild interstitial edema. No adenopathy. Bones are osteoporotic. Patient is status post median sternotomy.  IMPRESSION: Evidence of congestive heart failure. Cannot exclude superimposed pneumonia in the right base.   Electronically Signed   By: Bretta Bang III M.D.   On: 12/14/2014 14:37     Assessment and Plan  1. Acute on chronic diastolic CHF 2. Chronic atrial fibrillation, rate controlled 3. Chest pain with normal coronaries 12/2013, elevated troponin possibly due to #1 4. H/o AVR tissue valve at [facility], 2D Echo 12/15/14 with normal appearance and mild AS/AI 5. HTN with h/o tendency towards softer BP this admission  6. Hypothyroidism with abnormal thyroid indices this admission, managed by IM 7. Macrocytic anemia, per IM 8. CKD stage III  Weights do not seem to reflect the amount of volume she has put out -- net -21L since admission with significant symptomatic improvement. Since BUN/Cr have bumped further, will d/c metolazone. Hypokalemia should improve off this. Given bilateral effusions on CXR yesterday, would  continue torsemide for now. I will discuss plan for discharge dose with Dr. Tresa Endo given that she was only on PRN diuretic at home. I will also discuss Eliquis dose with MD. She is right on the cusp in terms of Cr (1.49) and she is 81 years old.   Discussed salt/fluid restriction with patient. Agree that hypoalbuminemia is likely playing a role in her chronic edema. Consider outpatient renal evaluation for CKD/hypoalbuminemia as she may need eval for nephrotic syndrome.  Tentatively have arranged f/u in flex clinic 01/04/15 with Robbie Lis in [facility] office - no availability at [employer] office.   Signed, Ronie Spies PA-C Pager: [phone]     Patient seen and examined. Agree with assessment and plan. I/O since admission -21,780.  Weight change (minimal) is inconsistent with this amount and I suspect weights are not accurate. DC metolazone.  Consider 24 hr urine to assess protein excre tion, can be done as an outpatient.Will need support  stockings. Suspect significant LE venous insufficiency consider LE venous duplex imaging.   Lennette Bihari, MD, [facility] 12/28/2014 1:19 PM

## 2014-12-29 ENCOUNTER — Inpatient Hospital Stay (HOSPITAL_COMMUNITY): Payer: Medicare Other

## 2014-12-29 LAB — SODIUM, URINE, RANDOM: Sodium, Ur: 30 mmol/L

## 2014-12-29 LAB — BASIC METABOLIC PANEL
Anion gap: 12 (ref 5–15)
BUN: 51 mg/dL — ABNORMAL HIGH (ref 6–23)
CHLORIDE: 101 mmol/L (ref 96–112)
CO2: 30 mmol/L (ref 19–32)
Calcium: 8.9 mg/dL (ref 8.4–10.5)
Creatinine, Ser: 1.58 mg/dL — ABNORMAL HIGH (ref 0.50–1.10)
GFR calc non Af Amer: 30 mL/min — ABNORMAL LOW (ref >90)
GFR, EST AFRICAN AMERICAN: 35 mL/min — AB (ref >90)
Glucose, Bld: 89 mg/dL (ref 70–99)
Potassium: 3.4 mmol/L — ABNORMAL LOW (ref 3.5–5.1)
SODIUM: 143 mmol/L (ref 135–145)

## 2014-12-29 LAB — URINALYSIS, ROUTINE W REFLEX MICROSCOPIC
Bilirubin Urine: NEGATIVE
Glucose, UA: NEGATIVE mg/dL
Ketones, ur: NEGATIVE mg/dL
NITRITE: POSITIVE — AB
PH: 5 (ref 5.0–8.0)
PROTEIN: NEGATIVE mg/dL
Specific Gravity, Urine: 1.01 (ref 1.005–1.030)
Urobilinogen, UA: 0.2 mg/dL (ref 0.0–1.0)

## 2014-12-29 LAB — OSMOLALITY, URINE: OSMOLALITY UR: 338 mosm/kg — AB (ref 390–1090)

## 2014-12-29 LAB — URINE MICROSCOPIC-ADD ON

## 2014-12-29 LAB — PROTEIN / CREATININE RATIO, URINE: Creatinine, Urine: 36.25 mg/dL

## 2014-12-29 LAB — GLUCOSE, CAPILLARY
GLUCOSE-CAPILLARY: 119 mg/dL — AB (ref 70–99)
GLUCOSE-CAPILLARY: 100 mg/dL — AB (ref 70–99)
Glucose-Capillary: 89 mg/dL (ref 70–99)
Glucose-Capillary: 111 mg/dL — ABNORMAL HIGH (ref 70–99)

## 2014-12-29 LAB — OSMOLALITY: Osmolality: 314 mOsm/kg — ABNORMAL HIGH (ref 275–300)

## 2014-12-29 LAB — CREATININE, URINE, RANDOM: CREATININE, URINE: 37.38 mg/dL

## 2014-12-29 MED ORDER — TORSEMIDE 20 MG PO TABS
20.0000 mg | ORAL_TABLET | Freq: Every day | ORAL | Status: DC
Start: 2014-12-29 — End: 2014-12-30
  Administered 2014-12-29 – 2014-12-30 (×2): 20 mg via ORAL
  Filled 2014-12-29 (×2): qty 1

## 2014-12-29 NOTE — Consult Note (Signed)
Melanie Camacho Admit Date: 12/14/2014 12/29/2014 Arita Miss Requesting Physician:  Burney Gauze  Reason for Consult:  AoCKD, Volume Overload HPI:  81 year old female seen at the request of Dr. seeing for the evaluation of an elevated serum creatinine. Patient has a recent baseline serum creatinine around 1.2-1.3. She was admitted to [facility] on 12/14/14 with hypervolemia and a presumed diastolic heart failure exacerbation. Pertinent past medical history includes history of bioprosthetic aortic valve in 2011 at [facility], chronic atrial fibrillation on up except in an beta-blockade with rate control, hypertension, type 2 diabetes. Preceding the admission to this hospital she was admitted to a local hospital with similar complaints where she underwent a 10 pound diuresis but unfortunately was unable to maintain her volume status prompting this admission.  On 12/27/14 the patient's serum creatinine increased for the first time and has increased each day thereafter to a level of 1.58 today. Ins and outs have documented a 22.8 L fluid deficit since admission secondary to diuresis but there is discrepancy and that her weights have only changed by 4 pounds from admission.  In the days preceding 12/27/14 she was receiving 3 times a day IV furosemide, metolazone. She was transitioned to oral torsemide. Metolazone was discontinued. There have been no IV contrast agents, nonsteroidals, or other obvious nephrotoxin exposures. Renal ultrasound obtained today demonstrated no structural abnormalities but some increased echogenicity in the smaller kidney on the right side. A urinalysis obtained today demonstrates no proteinuria. Her fractional excretion of sodium is 0.9%, measured today.  The patient is in good spirits. Despite the discordance between I/os and her weights she feels markedly improved. She best describes this as feeling stronger, with more stamina, and able to travel further  distances before developing dyspnea. She hopes to be discharged to a nursing facility tomorrow. She continues to have tight pitting peripheral edema up into the proximal thighs with overlying erythema, oozing, blisters. She tells me that though it is still present it is improved from when she came into the hospital   CREATININE, SER (mg/dL)  Date Value  02/72/5366 1.58*  12/28/2014 1.49*  12/27/2014 1.39*  12/25/2014 1.23*  12/24/2014 1.30*  12/23/2014 1.27*  12/22/2014 1.27*  12/21/2014 1.32*  12/20/2014 1.21*  12/19/2014 1.21*  ] I/Os: I/O last 3 completed shifts: In: 940 [P.O.:940] Out: 3050 [Urine:3050]  ROS Balance of 12 systems is negative w/ exceptions as above  PMH  Past Medical History  Diagnosis Date  . Anginal pain   . Hypertension   . Heart murmur   . CHF (congestive heart failure)   . Shortness of breath   . Complication of anesthesia     "I had a slight reaction to the morphine given in surgery"  . High cholesterol   . Coronary artery disease   . Myocardial infarction 12/2013  . Pneumonia "once or twice"  . 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 Type II diabetes mellitus   . Anemia   . History of blood transfusion     "related to the anemia"  . GERD (gastroesophageal reflux disease)   . Arthritis     "all over"  . Chronic lower back pain    PSH  Past Surgical History  Procedure Laterality Date  . Left heart catheterization with coronary angiogram N/A 12/28/2013    Procedure: LEFT HEART CATHETERIZATION WITH CORONARY ANGIOGRAM;  Surgeon: Lennette Bihari, MD;  Location: [facility] CATH LAB;  Service:  Cardiovascular;  Laterality: N/A;  . Cardiac catheterization    . Cholecystectomy open  1980's  . Appendectomy  ~ 1948  . Tonsillectomy  1954  . Hernia repair    . Abdominal hernia repair  "many times"  . Hemorrhoid banding    . Fracture surgery    . Foot fracture surgery Right ~ 1980  . Cataract extraction Right    FH  Family History  Problem  Relation Age of Onset  . Hypertension Mother    SH  reports that she has never smoked. She has never used smokeless tobacco. She reports that she does not drink alcohol or use illicit drugs. Allergies  Allergies  Allergen Reactions  . Morphine And Related     Stevens-Johnsons  . Tape   . Bacitracin Rash  . Betadine [Povidone Iodine] Rash  . Clindamycin/Lincomycin Rash  . Vancomycin Rash   Home medications Prior to Admission medications   Medication Sig Start Date End Date Taking? Authorizing Provider  apixaban (ELIQUIS) 5 MG TABS tablet Take 1 tablet (5 mg total) by mouth 2 (two) times daily. 12/31/13  Yes Rhonda G Barrett, PA-C  bisoprolol (ZEBETA) 5 MG tablet Take 1 tablet (5 mg total) by mouth daily. Patient taking differently: Take 10 mg by mouth daily.  12/29/13  Yes Rhonda G Barrett, PA-C  Cholecalciferol (VITAMIN D3) 5000 UNITS CAPS Take 5,000 Units by mouth daily.   Yes Historical Provider, MD  colesevelam (WELCHOL) 625 MG tablet Take 1,875 mg by mouth 2 (two) times daily with a meal.   Yes Historical Provider, MD  furosemide (LASIX) 40 MG tablet Take 40-60 mg by mouth daily as needed for fluid.    Yes Historical Provider, MD  gabapentin (NEURONTIN) 300 MG capsule Take 300 mg by mouth 2 (two) times daily.   Yes Historical Provider, MD  IRON PO Take 1 tablet by mouth daily.    Yes Historical Provider, MD  Multiple Vitamins-Minerals (ICAPS) CAPS Take 2 capsules by mouth daily.    Yes Historical Provider, MD  Multiple Vitamins-Minerals (MULTIVITAMIN WITH MINERALS) tablet Take 1 tablet by mouth daily.   Yes Historical Provider, MD  potassium chloride (K-DUR) 10 MEQ tablet Take 15 mEq by mouth daily.   Yes Historical Provider, MD  docusate sodium (COLACE) 100 MG capsule Take 1 capsule (100 mg total) by mouth 2 (two) times daily. 12/27/14   Kathlen Mody, MD  levothyroxine (SYNTHROID, LEVOTHROID) 25 MCG tablet Take 1 tablet (25 mcg total) by mouth daily before breakfast. 12/27/14    Kathlen Mody, MD  oxycodone (OXY-IR) 5 MG capsule Take 1 capsule (5 mg total) by mouth every 6 (six) hours as needed for pain. 12/27/14   Kathlen Mody, MD  pantoprazole (PROTONIX) 40 MG tablet Take 1 tablet (40 mg total) by mouth at bedtime. 12/27/14   Kathlen Mody, MD  potassium chloride SA (K-DUR,KLOR-CON) 20 MEQ tablet Take 2 tablets (40 mEq total) by mouth daily. 12/27/14   Kathlen Mody, MD  rOPINIRole (REQUIP) 0.25 MG tablet Take 0.25 mg by mouth 3 (three) times daily.    Historical Provider, MD  rOPINIRole (REQUIP) 0.25 MG tablet Take 1 tablet (0.25 mg total) by mouth 3 (three) times daily as needed (restless leg syndrome). 12/27/14   Kathlen Mody, MD  torsemide (DEMADEX) 20 MG tablet Take 2 tablets (40 mg total) by mouth 2 (two) times daily. 12/27/14   Kathlen Mody, MD    Current Medications Scheduled Meds: . apixaban  5 mg Oral BID  .  bisoprolol  5 mg Oral Daily  . colesevelam  1,875 mg Oral BID WC  . docusate sodium  100 mg Oral BID  . gabapentin  300 mg Oral BID  . insulin aspart  0-9 Units Subcutaneous TID WC  . levothyroxine  25 mcg Oral QAC breakfast  . multivitamin with minerals  1 tablet Oral Daily  . multivitamin-lutein  2 capsule Oral Daily  . nystatin cream   Topical BID  . pantoprazole  40 mg Oral QHS  . potassium chloride  40 mEq Oral Daily  . sodium chloride  3 mL Intravenous Q12H  . torsemide  20 mg Oral Daily   Continuous Infusions:  PRN Meds:.sodium chloride, acetaminophen, ALPRAZolam, cyclobenzaprine, gi cocktail, ondansetron (ZOFRAN) IV, oxycodone, rOPINIRole, sodium chloride  CBC No results for input(s): WBC, NEUTROABS, HGB, HCT, MCV, PLT in the last 168 hours. Basic Metabolic Panel  Recent Labs Lab 12/23/14 0436 12/24/14 0620 12/25/14 0434 12/27/14 0449 12/27/14 2025 12/28/14 0620 12/29/14 0544  NA 140 141 137 141  --  141 143  K 3.8 4.3 3.9 3.1* 3.8 3.4* 3.4*  CL 102 101 100 100  --  100 101  CO2 --  29 30  GLUCOSE 85 79 85 85  --  88  89  BUN 35* 37* 37* 47*  --  49* 51*  CREATININE 1.27* 1.30* 1.23* 1.39*  --  1.49* 1.58*  CALCIUM 8.6 8.9 8.7 8.9  --  8.9 8.9    Physical Exam  Blood pressure 94/52, pulse 67, temperature 97.5 F (36.4 C), temperature source Oral, resp. rate 18, height  (1.549 m), weight 103.329 kg (227 lb 12.8 oz), SpO2 94 %. GEN: NAD, sitting in chair ENT: dentures present, NCAT EYES: EOMI CV: IRIR. No murmur or rub PULM: CTAB, nl WOB ABD: s/nt/nd SKIN: No rashes or lesions ZOX:WRUE pitting edema of LEs with pink erythema and some oozing into proximal thighs  Assessment/Plan 81 year old female with mild acute kidney injury in the setting of aggressive diuresis for a presumed diastolic heart failure exacerbation.  Interestingly her transthoracic echocardiogram demonstrated normal systolic function, normal left ventricular wall thickness, and made no mention of diastolic dysfunction. Right-sided function appeared intact. Her renal ultrasound obtained today demonstrates no explanatory structural problems. Her fractional excretion of sodium from today suggests perceived low renal perfusion. I think the most likely etiology would be her aggressive diuresis leading to her acute kidney injury.  1. AKI likely 2/2 overdiuresis 1. Diuretic dosing has already been reasonably reduced 2. We'll continue to follow, hopefully it will improve over the next 24-48 hours at the reduced dosing 3. Check UP/C to quantify proteinuria but given the negative protein on her UA, hydralazine as a problem here 4. Continue avoidance of nephrotoxins such as NSAIDs and IV contrast 2. Hypervolemia and Edema:  1. I don't know what to make of the discordance between her weights and ins and outs. 2. Certainly she continues to have hypervolemia based on physical exam 3. Continue sodium restriction and fluid restriction 4. Agree with compression stockings to augment diuresis 3. Chronic AFib on APixaban 4. S/p bioprosthetic  AVR 5. HTN  Sabra Heck MD [phone] pgr 12/29/2014, 1:35 PM

## 2014-12-29 NOTE — Progress Notes (Signed)
Patient: Melanie Camacho / Admit Date: 12/14/2014 / Date of Encounter: 12/29/2014, 7:30 AM   Subjective: HR increased to 120s while on commode for BM, but now 80s. Still feeling better daily. No CP or SOB. LEE is about baseline for her.   Objective: Telemetry: atrial fib rates 80s-120s Physical Exam: Blood pressure 99/54, pulse 67, temperature 97.5 F (36.4 C), temperature source Oral, resp. rate 18, height 5\' 1"  (1.549 m), weight 227 lb 12.8 oz (103.329 kg), SpO2 94 %. General: Well developed, well nourished WF in no acute distress. Head: Normocephalic, atraumatic, sclera non-icteric, no xanthomas, nares are without discharge. Neck: JVP not elevated. Lungs: Diminished BS at right base but generally improved aeration there. Otherwise clear bilaterally to auscultation without wheezes, rales, or rhonchi. Breathing is unlabored. Heart: Irregularly irregular, rate controlled, systolic click noted over AV area, S1 S2 without murmurs, rubs, or gallops.  Abdomen: Soft, non-tender, non-distended with normoactive bowel sounds. No rebound/guarding. Extremities: No clubbing or cyanosis. 1+ BLE with stasis thickening of skin indicating chronic edema.  Neuro: Alert and oriented X 3. Moves all extremities spontaneously. Psych: Responds to questions appropriately with a normal affect.   Intake/Output Summary (Last 24 hours) at 12/29/14 0730 Last data filed at 12/29/14 0322  Gross per 24 hour  Intake    700 ml  Output   1650 ml  Net   -950 ml    Inpatient Medications:  . apixaban  5 mg Oral BID  . bisoprolol  5 mg Oral Daily  . colesevelam  1,875 mg Oral BID WC  . docusate sodium  100 mg Oral BID  . gabapentin  300 mg Oral BID  . insulin aspart  0-9 Units Subcutaneous TID WC  . levothyroxine  25 mcg Oral QAC breakfast  . multivitamin with minerals  1 tablet Oral Daily  . multivitamin-lutein  2 capsule Oral Daily  . nystatin cream   Topical BID  . pantoprazole  40 mg Oral QHS  . potassium  chloride  40 mEq Oral Daily  . sodium chloride  3 mL Intravenous Q12H  . torsemide  40 mg Oral BID   Infusions:    Labs:  Recent Labs  12/28/14 0620 12/29/14 0544  NA 141 143  K 3.4* 3.4*  CL 100 101  CO2 29 30  GLUCOSE 88 89  BUN 49* 51*  CREATININE 1.49* 1.58*  CALCIUM 8.9 8.9   No results for input(s): AST, ALT, ALKPHOS, BILITOT, PROT, ALBUMIN in the last 72 hours. No results for input(s): WBC, NEUTROABS, HGB, HCT, MCV, PLT in the last 72 hours. No results for input(s): CKTOTAL, CKMB, TROPONINI in the last 72 hours. Invalid input(s): POCBNP No results for input(s): HGBA1C in the last 72 hours.   Radiology/Studies:  Dg Chest 2 View  12/27/2014   CLINICAL DATA:  Chest pain for 1 day.  EXAM: CHEST  2 VIEW  COMPARISON:  12/20/2014.  FINDINGS: Stable enlarged cardiac silhouette. No significant change in bilateral pleural effusions. Post CABG changes are again demonstrated. Evidence of bilateral chronic rotator cuff tears. Left shoulder degenerative changes.  IMPRESSION: Stable cardiomegaly and bilateral pleural effusions.   Electronically Signed   By: Beckie Salts M.D.   On: 12/27/2014 15:47   Dg Chest 2 View  12/20/2014   CLINICAL DATA:  Congestive failure  EXAM: CHEST  2 VIEW  COMPARISON:  12/15/2014  FINDINGS: The cardiac shadow remains enlarged. A right-sided pleural effusion and right basilar atelectasis is again identified. Postsurgical changes are seen.  The left lung shows a small effusion but stable. No acute bony abnormality is seen.  IMPRESSION: Bilateral effusions right greater than left. Right basilar atelectasis is noted as well.   Electronically Signed   By: Alcide Clever M.D.   On: 12/20/2014 12:16   Dg Chest 2 View  12/15/2014   CLINICAL DATA:  Shortness of breath, history of aortic valve replacement an acute and chronic CHF  EXAM: CHEST  2 VIEW  COMPARISON:  Portable chest x-ray of December 14, 2014  FINDINGS: The left lung is well-expanded. There is a small left pleural  effusion. On the right there is a moderate-sized effusion with basilar atelectasis. The pulmonary interstitial markings remain mildly increased. The cardiac silhouette remains enlarged. There is tortuosity of the descending thoracic aorta.  IMPRESSION: COPD with superimposed CHF. Slight interval improvement in the appearance of the pulmonary interstitium has occurred since yesterday's study. There is persistent pleural fluid and basilar atelectasis on the right.   Electronically Signed   By: David  [place]   On: 12/15/2014 08:07   Dg Chest Port 1 View  12/14/2014   CLINICAL DATA:  Shortness of Breath  EXAM: PORTABLE CHEST - 1 VIEW  COMPARISON:  None.  FINDINGS: There is cardiomegaly with pulmonary venous hypertension. There is a right pleural effusion with airspace consolidation in the right base. There is mild interstitial edema. No adenopathy. Bones are osteoporotic. Patient is status post median sternotomy.  IMPRESSION: Evidence of congestive heart failure. Cannot exclude superimposed pneumonia in the right base.   Electronically Signed   By: Bretta Bang III M.D.   On: 12/14/2014 14:37     Assessment and Plan  1. Acute on chronic diastolic CHF 2. Chronic atrial fibrillation, rate controlled 3. Chest pain with normal coronaries 12/2013, elevated troponin possibly due to #1 4. H/o AVR tissue valve at [facility], 2D Echo 12/15/14 with normal appearance and mild AS/AI 5. HTN with h/o tendency towards softer BP this admission  6. Hypothyroidism with abnormal thyroid indices this admission, managed by IM 7. Macrocytic anemia, per IM 8. AKI on CKD stage III  Weights do not seem to reflect the amount of volume she has put out -- net -22L since admission with significant symptomatic improvement. Since BUN/Cr bumped, metolazone was discontinued yesterday. Dry weight not really known, has chronic LEE with suspected component of venous stasis. She was only on PRN diuretics at home. Since BUN continues to rise  higher than Cr, will drop torsemide further from  BID to  daily. Right pleural effusion persists but sounds better than yesterday. Hypokalemia should improve with this change. Agree that hypoalbuminemia is likely playing a role in her chronic edema. Consider outpatient renal evaluation for CKD/hypoalbuminemia as she may need eval for nephrotic syndrome.  I will also discuss Eliquis dose with MD. Baseline Cr 1.2-1.3, but her Cr has risen to >1.5 and she is 81 years old.   Tentatively have arranged f/u in flex clinic 01/04/15 with Robbie Lis in [facility] office - no availability at [employer] office.   Her daughter told me yesterday that she will not have transportation to rehab until tomorrow. This will give [place] another day to watch her renal function.  Signed, Ronie Spies PA-C Pager: [phone]   Personally seen and examined. Agree with above. Right lung base sound blunted Renal US per Dr. Thedore Mins, UA as well Decreased torsemide as above Legs still with tight 3+ edema  Donato Schultz, MD

## 2014-12-29 NOTE — Progress Notes (Signed)
[facility] PROGRESS NOTE  Melanie Camacho NUU:[id number] DOB: April 30, 1932 DOA: 12/14/2014 PCP: PROVIDER NOT IN SYSTEM Interim summary: 81 year old lady admitted for SOB. She was being treated for acute on chronic diastolic heart failure.    Assessment/Plan:  #1 Acute on chronic diastolic CHF exacerbation EF 55%  Cardiology on board, now switched to oral Demadex twice a day, is 22.5 L negative, still has 2+ edema in both lower extremities, discussed with cardiology, continue diuretics per Cards. Repeat BMP in the morning.  Filed Weights   12/27/14 0500 12/28/14 0539 12/29/14 0355  Weight: 101.334 kg (223 lb 6.4 oz) 105.098 kg (231 lb 11.2 oz) 103.329 kg (227 lb 12.8 oz)     #2 Chr atrial fibrillation CHADS VAsc Score - 3 Continue beta blocker for rate control. Continue eliquis for anticoagulation.   #3 Status post Tissue AVR 2011 at [facility]. 2-D echo with prosthesis present with normal range of motion and no evidence of dehiscence. Per cardiology.   #4 well-controlled diabetes mellitus diet-controlled Hemoglobin A1c is 5.5. CBGs have ranged from 94-119. Sliding scale insulin. CBG (last 3)   Recent Labs  12/28/14 1658 12/28/14 2154 12/29/14 0741  GLUCAP 116* 109* 89      #5 Hypothyroidism Started on Synthroid this admission, repeat TSH in 4-6 weeks. Was off Synthroid for 3 years.    #6.ARF on CKD 3 : Baseline creatinine close to 1.2, currently 1.58.  She is about 22.5 L negative however still has evidence of fluid overload, cardiology has reduced diuretic. Creatinine continues to climb. Urine electrolytes, UA and renal ultrasound ordered. Have requested renal to evaluate one time as well.     #7.Anemia: Macrocytic. Anemia panel   Showed b12 levels of 470 and normal folate levels.  This is likely due to anemia of chronic disease with hypothyroidism. Continue Synthroid and monitor.     Code Status: Full Family Communication: Updated patient , none at bedside.   Disposition Plan: discharge was creatinine stable   Consultants:  Cardiology   renal  Procedures:  Chest x-ray 12/15/2014, 12/14/2014  2-D echo 12/15/2014   - Left ventricle: The cavity size was normal. Wall thickness was normal. Systolic function was normal. The estimated ejection fraction was in the range of 55% to 60%. Wall motion was normal; there were no regional wall motion abnormalities. - Aortic valve: A prosthesis was present. The prosthesis had a normal range of motion. The sewing ring appeared normal, had no rocking motion, and showed no evidence of dehiscence. There was mild stenosis. There was mild regurgitation. Peak velocity (S): 240 cm/s. - Mitral valve: Moderately calcified annulus. Mildly thickened, mildly calcified leaflets . There was mild regurgitation. - Left atrium: The atrium was moderately dilated. - Right ventricle: The cavity size was mildly dilated. Wall thickness was normal. - Right atrium: The atrium was mildly dilated. - Pulmonary arteries: Systolic pressure was mildly increased. PA peak pressure: 31 mm Hg (S). - Pericardium, extracardiac: A trivial pericardial effusion was identified posterior to the heart.    Antibiotics:  None  HPI/Subjective:  Sitting up in chair, no headache, no chest or abdominal pain, no shortness of breath. No focal weakness  Objective: Filed Vitals:   12/29/14 0355  BP: 99/54  Pulse:   Temp: 97.5 F (36.4 C)  Resp: 18    Intake/Output Summary (Last 24 hours) at 12/29/14 1124 Last data filed at 12/29/14 0759  Gross per 24 hour  Intake    340 ml  Output  1150 ml  Net   -810 ml   Filed Weights   12/27/14 0500 12/28/14 0539 12/29/14 0355  Weight: 101.334 kg (223 lb 6.4 oz) 105.098 kg (231 lb 11.2 oz) 103.329 kg (227 lb 12.8 oz)    Exam:   General:  NAD.   Cardiovascular: Irregularly irregular,  Respiratory: air entry fair, no wheezing or rhonchi, no rales  hears  Abdomen: Soft, positive bowel sounds, nontender to palpation, lower abdominal wall edema.  Musculoskeletal: No clubbing cyanosis. 2 + edema upto the knees, scabs over the legs.   Data Reviewed: Basic Metabolic Panel:  Recent Labs Lab 12/24/14 0620 12/25/14 0434 12/27/14 0449 12/27/14 2025 12/28/14 0620 12/29/14 0544  NA 141 137 141  --  141 143  K 4.3 3.9 3.1* 3.8 3.4* 3.4*  CL 101 100 100  --  100 101  CO2 --  29 30  GLUCOSE 79 85 85  --  88 89  BUN 37* 37* 47*  --  49* 51*  CREATININE 1.30* 1.23* 1.39*  --  1.49* 1.58*  CALCIUM 8.9 8.7 8.9  --  8.9 8.9   Liver Function Tests: No results for input(s): AST, ALT, ALKPHOS, BILITOT, PROT, ALBUMIN in the last 168 hours. No results for input(s): LIPASE, AMYLASE in the last 168 hours. No results for input(s): AMMONIA in the last 168 hours. CBC: No results for input(s): WBC, NEUTROABS, HGB, HCT, MCV, PLT in the last 168 hours. Cardiac Enzymes: No results for input(s): CKTOTAL, CKMB, CKMBINDEX, TROPONINI in the last 168 hours. BNP (last 3 results)  Recent Labs  12/14/14 1410 12/27/14 0449  BNP 1222.9* 1099.7*    ProBNP (last 3 results) No results for input(s): PROBNP in the last 8760 hours.  CBG:  Recent Labs Lab 12/28/14 0756 12/28/14 1138 12/28/14 1658 12/28/14 2154 12/29/14 0741  GLUCAP 91 99 116* 109* 89    No results found for this or any previous visit (from the past 240 hour(s)).   Studies: Dg Chest 2 View  12/27/2014   CLINICAL DATA:  Chest pain for 1 day.  EXAM: CHEST  2 VIEW  COMPARISON:  12/20/2014.  FINDINGS: Stable enlarged cardiac silhouette. No significant change in bilateral pleural effusions. Post CABG changes are again demonstrated. Evidence of bilateral chronic rotator cuff tears. Left shoulder degenerative changes.  IMPRESSION: Stable cardiomegaly and bilateral pleural effusions.   Electronically Signed   By: Beckie Salts M.D.   On: 12/27/2014 15:47    Scheduled Meds: .  apixaban  5 mg Oral BID  . bisoprolol  5 mg Oral Daily  . colesevelam  1,875 mg Oral BID WC  . docusate sodium  100 mg Oral BID  . gabapentin  300 mg Oral BID  . insulin aspart  0-9 Units Subcutaneous TID WC  . levothyroxine  25 mcg Oral QAC breakfast  . multivitamin with minerals  1 tablet Oral Daily  . multivitamin-lutein  2 capsule Oral Daily  . nystatin cream   Topical BID  . pantoprazole  40 mg Oral QHS  . potassium chloride  40 mEq Oral Daily  . sodium chloride  3 mL Intravenous Q12H  . torsemide  20 mg Oral Daily   Continuous Infusions:   Principal Problem:   Acute on chronic diastolic heart failure Active Problems:   Chronic a-fib   S/P AVR-(tissue) 2011 at [facility] - no CABG with this   HTN- (transient hypotension during recent admission)   Hypothyroid   Obesity- BMI 45  DM (diabetes mellitus)   Left sided chest pain   Type 2 diabetes mellitus without complication   CKD (chronic kidney disease), stage III   Acute on chronic diastolic CHF (congestive heart failure), NYHA class 1   Venous insufficiency (chronic) (peripheral)    Time spent: 25 minutes    Susa Raring K M.D. [facility] Pager [phone]  If 7PM-7AM, please contact night-coverage at [email or web address], password [facility] 12/29/2014, 11:24 AM  LOS: 15 days

## 2014-12-29 NOTE — Progress Notes (Signed)
CSW (Clinical Social Worker) notified facility of potential for dc tomorrow.   Nataliya Graig, LCSWA [phone]  

## 2014-12-29 NOTE — Progress Notes (Signed)
Physical Therapy Treatment Patient Details Name: Melanie Camacho MRN: [id number] DOB: [birth year] Today's Date: Jan 14, 2015    History of Present Illness Pt adm with acute on chronic diastolic heart failure. PMH - CAD, NSTEMI, AVR, DM, HTN, CHF, a-fib.    PT Comments    Pt making slow, steady progress. Pt motivated to continue to improve.  Follow Up Recommendations  SNF     Equipment Recommendations  None recommended by PT    Recommendations for Other Services       Precautions / Restrictions Precautions Precautions: Fall Precaution Comments: very fearful of falling    Mobility  Bed Mobility                  Transfers Overall transfer level: Needs assistance Equipment used: 2 person hand held assist Transfers: Sit to/from Stand Sit to Stand: +2 physical assistance;Mod assist         General transfer comment: Hand held assist to come up due to shoulder pain.  Ambulation/Gait Ambulation/Gait assistance: Min guard Ambulation Distance (Feet): 45 Feet Assistive device: Rolling walker (2 wheeled) Gait Pattern/deviations: Step-to pattern;Decreased step length - right;Decreased step length - left;Trunk flexed;Shuffle Gait velocity: decr Gait velocity interpretation: Below normal speed for age/gender General Gait Details: Pt with difficulty with advancing legs past each other due to fluid in knees.   Stairs            Wheelchair Mobility    Modified Rankin (Stroke Patients Only)       Balance Overall balance assessment: Needs assistance Sitting-balance support: No upper extremity supported;Feet supported Sitting balance-Leahy Scale: Fair     Standing balance support: Bilateral upper extremity supported Standing balance-Leahy Scale: Poor Standing balance comment: UE support of walker                    Cognition Arousal/Alertness: Awake/alert Behavior During Therapy: WFL for tasks assessed/performed Overall Cognitive Status: Within  Functional Limits for tasks assessed                      Exercises      General Comments        Pertinent Vitals/Pain Pain Assessment: No/denies pain Faces Pain Scale: Hurts even more Pain Location: shoulder Pain Descriptors / Indicators: Aching Pain Intervention(s): Limited activity within patient's tolerance;Monitored during session    Home Living                      Prior Function            PT Goals (current goals can now be found in the care plan section) Acute Rehab PT Goals PT Goal Formulation: With patient Time For Goal Achievement: 01/05/15 Potential to Achieve Goals: Fair Progress towards PT goals: Goals met and updated - see care plan    Frequency  Min 2X/week    PT Plan Current plan remains appropriate    Co-evaluation             End of Session Equipment Utilized During Treatment: Gait belt Activity Tolerance: Patient tolerated treatment well Patient left: in chair;with call bell/phone within reach     Time: 1518-1550 PT Time Calculation (min) (ACUTE ONLY): 32 min  Charges:  $Gait Training: 23-37 mins                    G Codes:      Melanie Camacho 2015/01/14, 5:13 PM  [employer] PT [phone]

## 2014-12-30 LAB — BASIC METABOLIC PANEL
Anion gap: 14 (ref 5–15)
BUN: 53 mg/dL — AB (ref 6–23)
CALCIUM: 9.1 mg/dL (ref 8.4–10.5)
CO2: 26 mmol/L (ref 19–32)
Chloride: 101 mmol/L (ref 96–112)
Creatinine, Ser: 1.54 mg/dL — ABNORMAL HIGH (ref 0.50–1.10)
GFR calc Af Amer: 36 mL/min — ABNORMAL LOW (ref >90)
GFR calc non Af Amer: 31 mL/min — ABNORMAL LOW (ref >90)
GLUCOSE: 84 mg/dL (ref 70–99)
Potassium: 3.9 mmol/L (ref 3.5–5.1)
Sodium: 141 mmol/L (ref 135–145)

## 2014-12-30 LAB — GLUCOSE, CAPILLARY
GLUCOSE-CAPILLARY: 106 mg/dL — AB (ref 70–99)
Glucose-Capillary: 80 mg/dL (ref 70–99)

## 2014-12-30 MED ORDER — APIXABAN 2.5 MG PO TABS
2.5000 mg | ORAL_TABLET | Freq: Two times a day (BID) | ORAL | Status: DC
  Administered 2014-12-30: 2.5 mg via ORAL
  Filled 2014-12-30: qty 1

## 2014-12-30 MED ORDER — TORSEMIDE 20 MG PO TABS: 20.0000 mg | ORAL_TABLET | Freq: Every day | ORAL | Status: DC

## 2014-12-30 MED ORDER — CEFPODOXIME PROXETIL 200 MG PO TABS
200.0000 mg | ORAL_TABLET | Freq: Two times a day (BID) | ORAL | Status: DC
  Administered 2014-12-30: 200 mg via ORAL
  Filled 2014-12-30 (×2): qty 1

## 2014-12-30 MED ORDER — APIXABAN 2.5 MG PO TABS: 2.5000 mg | ORAL_TABLET | Freq: Two times a day (BID) | ORAL | Status: AC

## 2014-12-30 NOTE — Progress Notes (Signed)
Report given to SNF [facility]) RN phone #[phone].  Waiting for daughter to pick up pt and transport her to SNF.

## 2014-12-30 NOTE — Progress Notes (Signed)
Ok  per MD for d/c today to [facility] in [place], [place].  Her daughter Colette was notified and she planned to transport patient via car to facility (about a 2 hour drive from [place]).  Nursing notified to call report. CSW met with patient who is aware of d/c plan and states she is looking forward to her daughter getting here to take her to the facility.  Patient stated that she is feeling better and is please to be leaving the hospital.  No further CSW needs identified. CSW signing off.  Donna T. Crowder, LCSW [phone]    

## 2014-12-30 NOTE — Progress Notes (Signed)
Subjective: No SOB.   Objective: Vital signs in last 24 hours: Temp:  [97.5 F (36.4 C)-97.9 F (36.6 C)] 97.9 F (36.6 C) (04/22 0300) Pulse Rate:  [54-87] 54 (04/22 0300) Resp:  [16-20] 16 (04/22 0300) BP: (89-108)/(37-53) 89/53 mmHg (04/22 0300) SpO2:  [92 %-96 %] 92 % (04/22 0300) Weight:  [230 lb 9.6 oz (104.599 kg)] 230 lb 9.6 oz (104.599 kg) (04/22 0100) Last BM Date: 12/30/14  Intake/Output from previous day: 04/21 0701 - 04/22 0700 In: 220 [P.O.:220] Out: 1450 [Urine:1450] Intake/Output this shift: Total I/O In: 240 [P.O.:240] Out: -   Medications Current Facility-Administered Medications  Medication Dose Route Frequency Provider Last Rate Last Dose  . 0.9 %  sodium chloride infusion  250 mL Intravenous PRN Stephani Police, PA-C      . acetaminophen (TYLENOL) tablet 650 mg  650 mg Oral Q4H PRN Stephani Police, PA-C   650 mg at 12/27/14 0813  . ALPRAZolam Prudy Feeler) tablet 0.25 mg  0.25 mg Oral QHS PRN Abelino Derrick, PA-C   0.25 mg at 12/23/14 2237  . apixaban (ELIQUIS) tablet 2.5 mg  2.5 mg Oral BID Leroy Sea, MD      . bisoprolol (ZEBETA) tablet 5 mg  5 mg Oral Daily Stephani Police, PA-C   5 mg at 12/29/14 1748  . cefpodoxime (VANTIN) tablet 200 mg  200 mg Oral Q12H Leroy Sea, MD      . colesevelam [facility]) tablet 1,875 mg  1,875 mg Oral BID WC Stephani Police, PA-C   1,875 mg at 12/30/14 0753  . cyclobenzaprine (FLEXERIL) tablet 5 mg  5 mg Oral TID PRN Rodolph Bong, MD      . docusate sodium (COLACE) capsule 100 mg  100 mg Oral BID Rodolph Bong, MD   100 mg at 12/29/14 2143  . gabapentin (NEURONTIN) capsule 300 mg  300 mg Oral BID Stephani Police, PA-C   300 mg at 12/29/14 2143  . gi cocktail (Maalox,Lidocaine,Donnatal)  30 mL Oral BID PRN Stephani Police, PA-C   30 mL at 12/14/14 1640  . insulin aspart (novoLOG) injection 0-9 Units  0-9 Units Subcutaneous TID WC Rodolph Bong, MD   1 Units at 12/15/14 1735  . levothyroxine  (SYNTHROID, LEVOTHROID) tablet 25 mcg  25 mcg Oral QAC breakfast Rodolph Bong, MD   25 mcg at 12/30/14 0753  . multivitamin with minerals tablet 1 tablet  1 tablet Oral Daily Stephani Police, PA-C   1 tablet at 12/29/14 1147  . multivitamin-lutein (OCUVITE-LUTEIN) capsule 2 capsule  2 capsule Oral Daily Stephani Police, PA-C   2 capsule at 12/29/14 1147  . nystatin cream (MYCOSTATIN)   Topical BID Stephani Police, PA-C   1 application at 12/29/14 2200  . ondansetron (ZOFRAN) injection 4 mg  4 mg Intravenous Q6H PRN Stephani Police, PA-C      . oxycodone (OXY-IR) immediate release capsule 5 mg  5 mg Oral Q6H PRN Stephani Police, PA-C      . pantoprazole (PROTONIX) EC tablet 40 mg  40 mg Oral QHS Rodolph Bong, MD   40 mg at 12/29/14 2143  . potassium chloride SA (K-DUR,KLOR-CON) CR tablet 40 mEq  40 mEq Oral Daily Rodolph Bong, MD   40 mEq at 12/29/14 1148  . rOPINIRole (REQUIP) tablet 0.25 mg  0.25 mg Oral TID PRN Laurey Morale, MD   0.25 mg at 12/29/14  2143  . sodium chloride 0.9 % injection 3 mL  3 mL Intravenous Q12H Stephani Police, PA-C   3 mL at 12/29/14 2144  . sodium chloride 0.9 % injection 3 mL  3 mL Intravenous PRN Stephani Police, PA-C   3 mL at 12/25/14 2304  . torsemide (DEMADEX) tablet 20 mg  20 mg Oral Daily Dayna N Dunn, PA-C   20 mg at 12/29/14 1147    PE: General appearance: alert, cooperative and no distress Lungs: Decreased BS and crackles on the right. Improved since yesterday Heart: irregularly irregular rhythm Extremities: very tense pitting LEE.  Pulses: 2+ and symmetric Skin: Warm and dry.  LE errythema Neurologic: Grossly normal   Lab Results:  No results for input(s): WBC, HGB, HCT, PLT in the last 72 hours. BMET  Recent Labs  12/28/14 0620 12/29/14 0544 12/30/14 0650  NA 141 143 141  K 3.4* 3.4* 3.9  CL 100 101 101  CO2 GLUCOSE 88 89 84  BUN 49* 51* 53*  CREATININE 1.49* 1.58* 1.54*  CALCIUM 8.9 8.9 9.1     Assessment/Plan  Principal Problem:   Acute on chronic diastolic heart failure Net fluids:  -1.2L/-23.5L.   I doubt her PO intake was only .  Arm edema has improved.  Now on torsemide  daily only.  Scr stable.  Eliquis decreased to 2.5mg  BID due to Scr >1.5 and age.  She will be going directly to SNF in [place].  I do not think follow up here is reasonable.   Continue daily weight monitoring an low sodium diet.     Chronic a-fib  Rate controlled on bisoprolol.    S/P AVR-(tissue) 2011 at [facility] - no CABG with this   HTN- (transient hypotension during recent admission)  Hypotensive.  No dizziness.      Hypothyroid   Obesity- BMI 45   DM (diabetes mellitus)   Type 2 diabetes mellitus without complication   CKD (chronic kidney disease), stage III  SCr stable.  Nephrology following.    Venous insufficiency (chronic) (peripheral)  Wrap legs.     LOS: 16 days    HAGER, BRYAN PA-C 12/30/2014 9:40 AM   Patient seen and examined. Agree with assessment and plan. Wll need support stockings for venous insufficiency.Breathing better. No rales on exam. AF rate controlled. To go to SNF.   Lennette Bihari, MD, [facility] 12/30/2014 10:41 AM

## 2014-12-30 NOTE — Progress Notes (Signed)
Patient ID: Melanie Camacho, female   DOB: 1932/02/22, 81 y.o.   MRN: [id number] S:feels better O:BP 94/51 mmHg  Pulse 54  Temp(Src) 97.9 F (36.6 C) (Oral)  Resp 16  Ht 5\' 1"  (1.549 m)  Wt 104.599 kg (230 lb 9.6 oz)  BMI 43.59 kg/m2  SpO2 92%  Intake/Output Summary (Last 24 hours) at 12/30/14 1024 Last data filed at 12/30/14 0926  Gross per 24 hour  Intake    340 ml  Output   1150 ml  Net   -810 ml   Intake/Output: I/O last 3 completed shifts: In: 320 [P.O.:320] Out: 2300 [Urine:2300]  Intake/Output this shift:  Total I/O In: 240 [P.O.:240] Out: -  Weight change: 1.27 kg (2 lb 12.8 oz) Gen:WD WN obese WF in NAD CVS:IRR IRR Resp:cta SPQ:ZRAQT +BS Ext:2+ brawny edema   Recent Labs Lab 12/24/14 0620 12/25/14 0434 12/27/14 0449 12/27/14 2025 12/28/14 0620 12/29/14 0544 12/30/14 0650  NA 141 137 141  --  141 143 141  K 4.3 3.9 3.1* 3.8 3.4* 3.4* 3.9  CL 101 100 100  --  100 101 101  CO2 30 28 30   --  29 30 26   GLUCOSE 79 85 85  --  88 89 84  BUN 37* 37* 47*  --  49* 51* 53*  CREATININE 1.30* 1.23* 1.39*  --  1.49* 1.58* 1.54*  CALCIUM 8.9 8.7 8.9  --  8.9 8.9 9.1   Liver Function Tests: No results for input(s): AST, ALT, ALKPHOS, BILITOT, PROT, ALBUMIN in the last 168 hours. No results for input(s): LIPASE, AMYLASE in the last 168 hours. No results for input(s): AMMONIA in the last 168 hours. CBC: No results for input(s): WBC, NEUTROABS, HGB, HCT, MCV, PLT in the last 168 hours. Cardiac Enzymes: No results for input(s): CKTOTAL, CKMB, CKMBINDEX, TROPONINI in the last 168 hours. CBG:  Recent Labs Lab 12/29/14 0741 12/29/14 1138 12/29/14 1658 12/29/14 2033 12/30/14 0730  GLUCAP 89 111* 119* 100* 80    Iron Studies: No results for input(s): IRON, TIBC, TRANSFERRIN, FERRITIN in the last 72 hours. Studies/Results: US Renal  12/29/2014   CLINICAL DATA:  Acute renal failure  EXAM: RENAL/URINARY TRACT ULTRASOUND COMPLETE  COMPARISON:  None.  FINDINGS: Right  Kidney:  Length: 9.1 cm. Diffuse cortical thinning is noted. No focal mass lesion or hydronephrosis is noted.  Left Kidney:  Length: 10.2 cm. Echogenicity within normal limits. No mass or hydronephrosis visualized.  Bladder:  Appears normal for degree of bladder distention.  A 3.2 cm hepatic cyst is noted. Small bilateral pleural effusions are noted.  IMPRESSION: Mild cortical thinning in the right kidney.  No mass lesion or hydronephrosis is noted.  Small bilateral pleural effusions.  Hepatic cyst.   Electronically Signed   By: Alcide Clever M.D.   On: 12/29/2014 11:28   . apixaban  2.5 mg Oral BID  . bisoprolol  5 mg Oral Daily  . cefpodoxime  200 mg Oral Q12H  . colesevelam  1,875 mg Oral BID WC  . docusate sodium  100 mg Oral BID  . gabapentin  300 mg Oral BID  . insulin aspart  0-9 Units Subcutaneous TID WC  . levothyroxine  25 mcg Oral QAC breakfast  . multivitamin with minerals  1 tablet Oral Daily  . multivitamin-lutein  2 capsule Oral Daily  . nystatin cream   Topical BID  . pantoprazole  40 mg Oral QHS  . potassium chloride  40 mEq Oral Daily  .  sodium chloride  3 mL Intravenous Q12H  . torsemide  20 mg Oral Daily    BMET    Component Value Date/Time   NA 141 12/30/2014 0650   K 3.9 12/30/2014 0650   CL 101 12/30/2014 0650   CO2 26 12/30/2014 0650   GLUCOSE 84 12/30/2014 0650   BUN 53* 12/30/2014 0650   CREATININE 1.54* 12/30/2014 0650   CALCIUM 9.1 12/30/2014 0650   GFRNONAA 31* 12/30/2014 0650   GFRAA 36* 12/30/2014 0650   CBC    Component Value Date/Time   WBC 3.5* 12/21/2014 0517   RBC 3.41* 12/22/2014 0607   RBC 3.39* 12/21/2014 0517   HGB 10.4* 12/21/2014 0517   HCT 34.1* 12/21/2014 0517   PLT 172 12/21/2014 0517   MCV 100.6* 12/21/2014 0517   MCH 30.7 12/21/2014 0517   MCHC 30.5 12/21/2014 0517   RDW 16.4* 12/21/2014 0517     Assessment/Plan 81 year old female with mild acute kidney injury in the setting of aggressive diuresis for a presumed diastolic  heart failure exacerbation. Interestingly her transthoracic echocardiogram demonstrated normal systolic function, normal left ventricular wall thickness, and made no mention of diastolic dysfunction. Right-sided function appeared intact. Her renal ultrasound obtained today demonstrates no explanatory structural problems. Her fractional excretion of sodium from today suggests perceived low renal perfusion. I think the most likely etiology would be her aggressive diuresis leading to her acute kidney injury.  1. AKI likely 2/2 overdiuresis 1. Diuretic dosing has already been reasonably reduced and Scr improved 2. Will need to be followed closely at SNF 3. No significant proteinuria so volume is related to CHF.   4. Continue avoidance of nephrotoxins such as NSAIDs and IV contrast 2. Hypervolemia and Edema:  1. I don't know what to make of the discordance between her weights and ins and outs. 2. Certainly she continues to have hypervolemia based on physical exam 3. Continue sodium restriction and fluid restriction 4. Agree with compression stockings to augment diuresis 3. Chronic AFib on APixaban 4. S/p bioprosthetic AVR 5. HTN 6. For discharge to SNF today, and will need to be followed there.  Jamilya Sarrazin A

## 2014-12-31 LAB — URINE CULTURE

## 2015-01-02 NOTE — Telephone Encounter (Signed)
Pt discharged to a SNF per social worker and case managers note in the hospital.  Pt discharged to [facility] in [place], [place].  No TCM call to be placed.

## 2015-01-04 ENCOUNTER — Encounter: Payer: Medicare Other | Admitting: Cardiology

## 2015-01-05 ENCOUNTER — Inpatient Hospital Stay (HOSPITAL_COMMUNITY)
Admission: EM | Admit: 2015-01-05 | Discharge: 2015-01-27 | DRG: 291 | Disposition: A | Discharge status: Skilled Nursing Facility | Payer: Medicare Other | Attending: Internal Medicine | Admitting: Internal Medicine

## 2015-01-05 ENCOUNTER — Encounter (HOSPITAL_COMMUNITY): Payer: Self-pay | Admitting: *Deleted

## 2015-01-05 ENCOUNTER — Emergency Department (HOSPITAL_COMMUNITY): Payer: Medicare Other

## 2015-01-05 DIAGNOSIS — Z6841 Body Mass Index (BMI) 40.0 and over, adult: Secondary | ICD-10-CM

## 2015-01-05 DIAGNOSIS — I959 Hypotension, unspecified: Secondary | ICD-10-CM | POA: Diagnosis not present

## 2015-01-05 DIAGNOSIS — R601 Generalized edema: Secondary | ICD-10-CM | POA: Diagnosis not present

## 2015-01-05 DIAGNOSIS — I493 Ventricular premature depolarization: Secondary | ICD-10-CM | POA: Insufficient documentation

## 2015-01-05 DIAGNOSIS — E109 Type 1 diabetes mellitus without complications: Secondary | ICD-10-CM | POA: Diagnosis not present

## 2015-01-05 DIAGNOSIS — R109 Unspecified abdominal pain: Secondary | ICD-10-CM | POA: Diagnosis present

## 2015-01-05 DIAGNOSIS — I129 Hypertensive chronic kidney disease with stage 1 through stage 4 chronic kidney disease, or unspecified chronic kidney disease: Secondary | ICD-10-CM | POA: Diagnosis present

## 2015-01-05 DIAGNOSIS — I482 Chronic atrial fibrillation, unspecified: Secondary | ICD-10-CM | POA: Diagnosis present

## 2015-01-05 DIAGNOSIS — I50810 Right heart failure, unspecified: Secondary | ICD-10-CM | POA: Insufficient documentation

## 2015-01-05 DIAGNOSIS — R040 Epistaxis: Secondary | ICD-10-CM | POA: Diagnosis not present

## 2015-01-05 DIAGNOSIS — I2781 Cor pulmonale (chronic): Secondary | ICD-10-CM | POA: Diagnosis present

## 2015-01-05 DIAGNOSIS — N183 Chronic kidney disease, stage 3 unspecified: Secondary | ICD-10-CM | POA: Diagnosis present

## 2015-01-05 DIAGNOSIS — M1990 Unspecified osteoarthritis, unspecified site: Secondary | ICD-10-CM | POA: Diagnosis present

## 2015-01-05 DIAGNOSIS — R1084 Generalized abdominal pain: Secondary | ICD-10-CM | POA: Diagnosis not present

## 2015-01-05 DIAGNOSIS — Z952 Presence of prosthetic heart valve: Secondary | ICD-10-CM

## 2015-01-05 DIAGNOSIS — I272 Other secondary pulmonary hypertension: Secondary | ICD-10-CM | POA: Diagnosis present

## 2015-01-05 DIAGNOSIS — D509 Iron deficiency anemia, unspecified: Secondary | ICD-10-CM | POA: Diagnosis present

## 2015-01-05 DIAGNOSIS — I459 Conduction disorder, unspecified: Secondary | ICD-10-CM | POA: Diagnosis not present

## 2015-01-05 DIAGNOSIS — I10 Essential (primary) hypertension: Secondary | ICD-10-CM | POA: Diagnosis not present

## 2015-01-05 DIAGNOSIS — K219 Gastro-esophageal reflux disease without esophagitis: Secondary | ICD-10-CM | POA: Diagnosis present

## 2015-01-05 DIAGNOSIS — I2510 Atherosclerotic heart disease of native coronary artery without angina pectoris: Secondary | ICD-10-CM | POA: Diagnosis present

## 2015-01-05 DIAGNOSIS — Z8249 Family history of ischemic heart disease and other diseases of the circulatory system: Secondary | ICD-10-CM

## 2015-01-05 DIAGNOSIS — G9340 Encephalopathy, unspecified: Secondary | ICD-10-CM | POA: Diagnosis present

## 2015-01-05 DIAGNOSIS — D539 Nutritional anemia, unspecified: Secondary | ICD-10-CM | POA: Diagnosis present

## 2015-01-05 DIAGNOSIS — E039 Hypothyroidism, unspecified: Secondary | ICD-10-CM | POA: Diagnosis not present

## 2015-01-05 DIAGNOSIS — N179 Acute kidney failure, unspecified: Secondary | ICD-10-CM | POA: Diagnosis not present

## 2015-01-05 DIAGNOSIS — K7290 Hepatic failure, unspecified without coma: Secondary | ICD-10-CM | POA: Diagnosis not present

## 2015-01-05 DIAGNOSIS — I509 Heart failure, unspecified: Secondary | ICD-10-CM

## 2015-01-05 DIAGNOSIS — Z79899 Other long term (current) drug therapy: Secondary | ICD-10-CM | POA: Diagnosis not present

## 2015-01-05 DIAGNOSIS — Z954 Presence of other heart-valve replacement: Secondary | ICD-10-CM | POA: Diagnosis not present

## 2015-01-05 DIAGNOSIS — R5381 Other malaise: Secondary | ICD-10-CM | POA: Diagnosis not present

## 2015-01-05 DIAGNOSIS — E876 Hypokalemia: Secondary | ICD-10-CM | POA: Insufficient documentation

## 2015-01-05 DIAGNOSIS — I5033 Acute on chronic diastolic (congestive) heart failure: Principal | ICD-10-CM | POA: Diagnosis present

## 2015-01-05 DIAGNOSIS — R0602 Shortness of breath: Secondary | ICD-10-CM | POA: Diagnosis present

## 2015-01-05 DIAGNOSIS — R1031 Right lower quadrant pain: Secondary | ICD-10-CM | POA: Diagnosis not present

## 2015-01-05 DIAGNOSIS — E8809 Other disorders of plasma-protein metabolism, not elsewhere classified: Secondary | ICD-10-CM | POA: Diagnosis not present

## 2015-01-05 DIAGNOSIS — Z7901 Long term (current) use of anticoagulants: Secondary | ICD-10-CM

## 2015-01-05 DIAGNOSIS — R5383 Other fatigue: Secondary | ICD-10-CM

## 2015-01-05 DIAGNOSIS — R197 Diarrhea, unspecified: Secondary | ICD-10-CM | POA: Diagnosis not present

## 2015-01-05 DIAGNOSIS — B029 Zoster without complications: Secondary | ICD-10-CM | POA: Diagnosis not present

## 2015-01-05 DIAGNOSIS — E875 Hyperkalemia: Secondary | ICD-10-CM | POA: Diagnosis not present

## 2015-01-05 DIAGNOSIS — I252 Old myocardial infarction: Secondary | ICD-10-CM

## 2015-01-05 DIAGNOSIS — E785 Hyperlipidemia, unspecified: Secondary | ICD-10-CM | POA: Diagnosis present

## 2015-01-05 DIAGNOSIS — R0600 Dyspnea, unspecified: Secondary | ICD-10-CM | POA: Diagnosis not present

## 2015-01-05 DIAGNOSIS — G2581 Restless legs syndrome: Secondary | ICD-10-CM | POA: Diagnosis present

## 2015-01-05 DIAGNOSIS — IMO0002 Reserved for concepts with insufficient information to code with codable children: Secondary | ICD-10-CM

## 2015-01-05 DIAGNOSIS — R229 Localized swelling, mass and lump, unspecified: Secondary | ICD-10-CM

## 2015-01-05 DIAGNOSIS — Z953 Presence of xenogenic heart valve: Secondary | ICD-10-CM | POA: Diagnosis not present

## 2015-01-05 DIAGNOSIS — R001 Bradycardia, unspecified: Secondary | ICD-10-CM | POA: Diagnosis not present

## 2015-01-05 DIAGNOSIS — A419 Sepsis, unspecified organism: Secondary | ICD-10-CM | POA: Diagnosis not present

## 2015-01-05 DIAGNOSIS — E119 Type 2 diabetes mellitus without complications: Secondary | ICD-10-CM | POA: Diagnosis present

## 2015-01-05 DIAGNOSIS — Z789 Other specified health status: Secondary | ICD-10-CM

## 2015-01-05 DIAGNOSIS — Z09 Encounter for follow-up examination after completed treatment for conditions other than malignant neoplasm: Secondary | ICD-10-CM

## 2015-01-05 LAB — URINALYSIS, ROUTINE W REFLEX MICROSCOPIC
BILIRUBIN URINE: NEGATIVE
GLUCOSE, UA: NEGATIVE mg/dL
Hgb urine dipstick: NEGATIVE
KETONES UR: NEGATIVE mg/dL
Leukocytes, UA: NEGATIVE
Nitrite: NEGATIVE
Protein, ur: NEGATIVE mg/dL
Specific Gravity, Urine: 1.011 (ref 1.005–1.030)
Urobilinogen, UA: 0.2 mg/dL (ref 0.0–1.0)
pH: 5 (ref 5.0–8.0)

## 2015-01-05 LAB — COMPREHENSIVE METABOLIC PANEL
ALT: 14 U/L (ref 0–35)
ANION GAP: 8 (ref 5–15)
AST: 30 U/L (ref 0–37)
Albumin: 2.9 g/dL — ABNORMAL LOW (ref 3.5–5.2)
Alkaline Phosphatase: 74 U/L (ref 39–117)
BUN: 43 mg/dL — AB (ref 6–23)
CALCIUM: 9 mg/dL (ref 8.4–10.5)
CO2: 28 mmol/L (ref 19–32)
CREATININE: 1.32 mg/dL — AB (ref 0.50–1.10)
Chloride: 105 mmol/L (ref 96–112)
GFR calc Af Amer: 43 mL/min — ABNORMAL LOW (ref >90)
GFR calc non Af Amer: 37 mL/min — ABNORMAL LOW (ref >90)
Glucose, Bld: 105 mg/dL — ABNORMAL HIGH (ref 70–99)
Potassium: 4.3 mmol/L (ref 3.5–5.1)
Sodium: 141 mmol/L (ref 135–145)
Total Bilirubin: 0.7 mg/dL (ref 0.3–1.2)
Total Protein: 7 g/dL (ref 6.0–8.3)

## 2015-01-05 LAB — CBC
HCT: 34.6 % — ABNORMAL LOW (ref 36.0–46.0)
HEMOGLOBIN: 10.5 g/dL — AB (ref 12.0–15.0)
MCH: 31.6 pg (ref 26.0–34.0)
MCHC: 30.3 g/dL (ref 30.0–36.0)
MCV: 104.2 fL — ABNORMAL HIGH (ref 78.0–100.0)
PLATELETS: 148 10*3/uL — AB (ref 150–400)
RBC: 3.32 MIL/uL — AB (ref 3.87–5.11)
RDW: 16.1 % — ABNORMAL HIGH (ref 11.5–15.5)
WBC: 3.7 10*3/uL — AB (ref 4.0–10.5)

## 2015-01-05 LAB — TROPONIN I: Troponin I: 0.03 ng/mL (ref <0.031)

## 2015-01-05 LAB — GLUCOSE, CAPILLARY: Glucose-Capillary: 84 mg/dL (ref 70–99)

## 2015-01-05 LAB — BRAIN NATRIURETIC PEPTIDE: B NATRIURETIC PEPTIDE 5: 972.1 pg/mL — AB (ref 0.0–100.0)

## 2015-01-05 MED ORDER — VITAMIN D3 125 MCG (5000 UT) PO CAPS: 5000.0000 [IU] | ORAL_CAPSULE | Freq: Every day | ORAL | Status: DC

## 2015-01-05 MED ORDER — FERROUS FUMARATE 325 (106 FE) MG PO TABS
1.0000 | ORAL_TABLET | Freq: Every day | ORAL | Status: DC
  Administered 2015-01-05: 106 mg via ORAL
  Administered 2015-01-06: 1 via ORAL
  Administered 2015-01-07 – 2015-01-11 (×5): 106 mg via ORAL
  Administered 2015-01-12: 1 via ORAL
  Administered 2015-01-13 – 2015-01-22 (×10): 106 mg via ORAL
  Administered 2015-01-23 – 2015-01-24 (×2): 1 via ORAL
  Administered 2015-01-25 – 2015-01-27 (×3): 106 mg via ORAL
  Filled 2015-01-05 (×25): qty 1

## 2015-01-05 MED ORDER — APIXABAN 2.5 MG PO TABS
2.5000 mg | ORAL_TABLET | Freq: Two times a day (BID) | ORAL | Status: DC
  Administered 2015-01-05 – 2015-01-10 (×10): 2.5 mg via ORAL
  Filled 2015-01-05 (×11): qty 1

## 2015-01-05 MED ORDER — ROPINIROLE HCL 0.25 MG PO TABS
0.2500 mg | ORAL_TABLET | Freq: Three times a day (TID) | ORAL | Status: DC | PRN
  Administered 2015-01-07: 0.25 mg via ORAL
  Filled 2015-01-05 (×3): qty 1

## 2015-01-05 MED ORDER — OCUVITE-LUTEIN PO CAPS
2.0000 | ORAL_CAPSULE | Freq: Every day | ORAL | Status: DC
  Administered 2015-01-06 – 2015-01-08 (×3): 2 via ORAL
  Filled 2015-01-05 (×4): qty 2

## 2015-01-05 MED ORDER — DOCUSATE SODIUM 100 MG PO CAPS
100.0000 mg | ORAL_CAPSULE | Freq: Two times a day (BID) | ORAL | Status: DC
  Administered 2015-01-07 – 2015-01-14 (×8): 100 mg via ORAL
  Filled 2015-01-05 (×21): qty 1

## 2015-01-05 MED ORDER — ONDANSETRON HCL 4 MG/2ML IJ SOLN
4.0000 mg | Freq: Four times a day (QID) | INTRAMUSCULAR | Status: DC | PRN
  Filled 2015-01-05: qty 2

## 2015-01-05 MED ORDER — SODIUM CHLORIDE 0.9 % IJ SOLN
3.0000 mL | Freq: Two times a day (BID) | INTRAMUSCULAR | Status: DC
  Administered 2015-01-05 – 2015-01-27 (×34): 3 mL via INTRAVENOUS

## 2015-01-05 MED ORDER — SODIUM CHLORIDE 0.9 % IJ SOLN
3.0000 mL | INTRAMUSCULAR | Status: DC | PRN
  Administered 2015-01-13 – 2015-01-20 (×2): 3 mL via INTRAVENOUS
  Filled 2015-01-05 (×2): qty 3

## 2015-01-05 MED ORDER — LEVOTHYROXINE SODIUM 25 MCG PO TABS
25.0000 ug | ORAL_TABLET | Freq: Every day | ORAL | Status: DC
  Administered 2015-01-06 – 2015-01-12 (×7): 25 ug via ORAL
  Filled 2015-01-05 (×8): qty 1

## 2015-01-05 MED ORDER — COLESEVELAM HCL 625 MG PO TABS
1875.0000 mg | ORAL_TABLET | Freq: Two times a day (BID) | ORAL | Status: DC
  Administered 2015-01-06 – 2015-01-27 (×43): 1875 mg via ORAL
  Filled 2015-01-05 (×47): qty 3

## 2015-01-05 MED ORDER — ACETAMINOPHEN 325 MG PO TABS
650.0000 mg | ORAL_TABLET | ORAL | Status: DC | PRN
  Administered 2015-01-14 – 2015-01-15 (×2): 650 mg via ORAL
  Filled 2015-01-05 (×2): qty 2

## 2015-01-05 MED ORDER — PANTOPRAZOLE SODIUM 40 MG PO TBEC
40.0000 mg | DELAYED_RELEASE_TABLET | Freq: Every day | ORAL | Status: DC
  Administered 2015-01-05 – 2015-01-08 (×4): 40 mg via ORAL
  Filled 2015-01-05 (×4): qty 1

## 2015-01-05 MED ORDER — FUROSEMIDE 10 MG/ML IJ SOLN
60.0000 mg | Freq: Once | INTRAMUSCULAR | Status: AC
  Administered 2015-01-05: 60 mg via INTRAVENOUS
  Filled 2015-01-05: qty 6

## 2015-01-05 MED ORDER — GABAPENTIN 300 MG PO CAPS
300.0000 mg | ORAL_CAPSULE | Freq: Two times a day (BID) | ORAL | Status: DC
  Administered 2015-01-05 – 2015-01-11 (×13): 300 mg via ORAL
  Filled 2015-01-05 (×15): qty 1

## 2015-01-05 MED ORDER — FUROSEMIDE 80 MG PO TABS
80.0000 mg | ORAL_TABLET | Freq: Two times a day (BID) | ORAL | Status: DC
  Filled 2015-01-05 (×2): qty 1

## 2015-01-05 MED ORDER — OXYCODONE HCL 5 MG PO TABS
5.0000 mg | ORAL_TABLET | Freq: Four times a day (QID) | ORAL | Status: DC | PRN
  Administered 2015-01-07 – 2015-01-27 (×14): 5 mg via ORAL
  Filled 2015-01-05 (×15): qty 1

## 2015-01-05 MED ORDER — COLESEVELAM HCL 625 MG PO TABS: 1875.0000 mg | ORAL_TABLET | Freq: Two times a day (BID) | ORAL | Status: DC

## 2015-01-05 MED ORDER — SODIUM CHLORIDE 0.9 % IV SOLN: 250.0000 mL | INTRAVENOUS | Status: DC | PRN

## 2015-01-05 MED ORDER — BISOPROLOL FUMARATE 10 MG PO TABS
10.0000 mg | ORAL_TABLET | Freq: Every day | ORAL | Status: DC
  Administered 2015-01-06 – 2015-01-09 (×2): 10 mg via ORAL
  Filled 2015-01-05 (×5): qty 1

## 2015-01-05 NOTE — ED Notes (Signed)
MD at bedside. 

## 2015-01-05 NOTE — ED Notes (Signed)
Pt was discharged from [facility] last Fri after being tx x 16 days for CHF.  Daughter pulled her out of rehab today d/t sob and the fact that she had gained almost 10 lbs in rehab in less than a week.  Dyspnea at rest.

## 2015-01-05 NOTE — ED Provider Notes (Signed)
CSN: [id number]     Arrival date & time 01/05/15  1503 History   First MD Initiated Contact with Patient 01/05/15 1649     Chief Complaint  Patient presents with  . Shortness of Breath     (Consider location/radiation/quality/duration/timing/severity/associated sxs/prior Treatment) Patient is a 81 y.o. female presenting with shortness of breath.  Shortness of Breath Severity:  Severe Onset quality:  Gradual Timing:  Constant Progression:  Worsening Chronicity:  Recurrent Context comment:  Dc'd from hospital to rehab facility about 6 days ago.  Feels that she has been getting progressively worse since leaving the hospital Relieved by:  Rest Worsened by:  Exertion Associated symptoms: abdominal pain (bloating)   Associated symptoms: no fever   Associated symptoms comment:  DOE, orthopnea, generalized fatigue   Past Medical History  Diagnosis Date  . Anginal pain   . Hypertension   . Heart murmur   . CHF (congestive heart failure)   . Shortness of breath   . Complication of anesthesia     "I had a slight reaction to the morphine given in surgery"  . High cholesterol   . Coronary artery disease   . Myocardial infarction 12/2013  . Pneumonia "once or twice"  . 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 Type II diabetes mellitus   . Anemia   . History of blood transfusion     "related to the anemia"  . GERD (gastroesophageal reflux disease)   . Arthritis     "all over"  . Chronic lower back pain    Past Surgical History  Procedure Laterality Date  . Left heart catheterization with coronary angiogram N/A 12/28/2013    Procedure: LEFT HEART CATHETERIZATION WITH CORONARY ANGIOGRAM;  Surgeon: Lennette Bihari, MD;  Location: [facility] CATH LAB;  Service: Cardiovascular;  Laterality: N/A;  . Cardiac catheterization    . Cholecystectomy open  1980's  . Appendectomy  ~ 1948  . Tonsillectomy  1954  . Hernia repair    . Abdominal hernia repair  "many times"  . Hemorrhoid banding     . Fracture surgery    . Foot fracture surgery Right ~ 1980  . Cataract extraction Right    Family History  Problem Relation Age of Onset  . Hypertension Mother    History  Substance Use Topics  . Smoking status: Never Smoker   . Smokeless tobacco: Never Used  . Alcohol Use: No   OB History    No data available     Review of Systems  Constitutional: Negative for fever.  Respiratory: Positive for shortness of breath.   Gastrointestinal: Positive for abdominal pain (bloating).  All other systems reviewed and are negative.     Allergies  Morphine and related; Tape; Bacitracin; Betadine; Clindamycin/lincomycin; and Vancomycin  Home Medications   Prior to Admission medications   Medication Sig Start Date End Date Taking? Authorizing Provider  apixaban (ELIQUIS) 2.5 MG TABS tablet Take 1 tablet (2.5 mg total) by mouth 2 (two) times daily. 12/30/14  Yes Leroy Sea, MD  bisoprolol (ZEBETA) 5 MG tablet Take 1 tablet (5 mg total) by mouth daily. Patient taking differently: Take 10 mg by mouth daily.  12/29/13  Yes Rhonda G Barrett, PA-C  Cholecalciferol (VITAMIN D3) 5000 UNITS CAPS Take 5,000 Units by mouth daily.   Yes Historical Provider, MD  colesevelam (WELCHOL) 625 MG tablet Take 1,875 mg by mouth 2 (two) times daily with a meal.   Yes  Historical Provider, MD  docusate sodium (COLACE) 100 MG capsule Take 1 capsule (100 mg total) by mouth 2 (two) times daily. 12/27/14  Yes Kathlen Mody, MD  ferrous fumarate (HEMOCYTE - 106 MG FE) 325 (106 FE) MG TABS tablet Take 1 tablet by mouth daily.   Yes Historical Provider, MD  gabapentin (NEURONTIN) 300 MG capsule Take 300 mg by mouth 2 (two) times daily.   Yes Historical Provider, MD  levothyroxine (SYNTHROID, LEVOTHROID) 25 MCG tablet Take 1 tablet (25 mcg total) by mouth daily before breakfast. 12/27/14  Yes Kathlen Mody, MD  Multiple Vitamins-Minerals (ICAPS) CAPS Take 2 capsules by mouth daily.    Yes Historical Provider, MD   oxycodone (OXY-IR) 5 MG capsule Take 1 capsule (5 mg total) by mouth every 6 (six) hours as needed for pain. 12/27/14  Yes Kathlen Mody, MD  pantoprazole (PROTONIX) 40 MG tablet Take 1 tablet (40 mg total) by mouth at bedtime. 12/27/14  Yes Kathlen Mody, MD  potassium chloride SA (K-DUR,KLOR-CON) 20 MEQ tablet Take 2 tablets (40 mEq total) by mouth daily. 12/27/14  Yes Kathlen Mody, MD  rOPINIRole (REQUIP) 0.25 MG tablet Take 1 tablet (0.25 mg total) by mouth 3 (three) times daily as needed (restless leg syndrome). 12/27/14  Yes Kathlen Mody, MD  torsemide (DEMADEX) 20 MG tablet Take 1 tablet (20 mg total) by mouth daily. 12/30/14  Yes Leroy Sea, MD   BP 103/71 mmHg  Pulse 73  Temp(Src) 97.5 F (36.4 C) (Oral)  Resp 20  Ht  (1.575 m)  Wt 240 lb 11.2 oz (109.181 kg)  BMI 44.01 kg/m2  SpO2 95% Physical Exam  Constitutional: She is oriented to person, place, and time. She appears well-developed and well-nourished. No distress.  HENT:  Head: Normocephalic and atraumatic.  Mouth/Throat: Oropharynx is clear and moist.  Eyes: Conjunctivae are normal. Pupils are equal, round, and reactive to light. No scleral icterus.  Neck: Neck supple.  Cardiovascular: Normal rate, regular rhythm, normal heart sounds and intact distal pulses.   No murmur heard. Pulmonary/Chest: Effort normal and breath sounds normal. No stridor. Tachypnea noted. No respiratory distress.  Poor air movement.  Abdominal: Soft. Bowel sounds are normal. She exhibits no distension. There is no tenderness.  Musculoskeletal: Normal range of motion. She exhibits edema (BLE edema to mid abdomen.  BUE hand and forearm edema.).  Neurological: She is alert and oriented to person, place, and time.  Skin: Skin is warm and dry. No rash noted.  Psychiatric: She has a normal mood and affect. Her behavior is normal.  Nursing note and vitals reviewed.   ED Course  Procedures (including critical care time) Labs Review Labs  Reviewed  CBC - Abnormal; Notable for the following:    WBC 3.7 (*)    RBC 3.32 (*)    Hemoglobin 10.5 (*)    HCT 34.6 (*)    MCV 104.2 (*)    RDW 16.1 (*)    Platelets 148 (*)    All other components within normal limits  BRAIN NATRIURETIC PEPTIDE - Abnormal; Notable for the following:    B Natriuretic Peptide 972.1 (*)    All other components within normal limits  COMPREHENSIVE METABOLIC PANEL - Abnormal; Notable for the following:    Glucose, Bld 105 (*)    BUN 43 (*)    Creatinine, Ser 1.32 (*)    Albumin 2.9 (*)    GFR calc non Af Amer 37 (*)    GFR calc Af Denyse Dago  43 (*)    All other components within normal limits  TROPONIN I    Imaging Review Dg Chest 2 View  01/05/2015   CLINICAL DATA:  Congestive heart failure shortness breath.  EXAM: CHEST  2 VIEW  COMPARISON:  Radiograph 12/27/2014  FINDINGS: Cardiac silhouette is enlarged. There is bilateral pleural effusions similar prior. There is mild central venous pulmonary congestion. Mild basilar atelectasis.  IMPRESSION: Bilateral pleural effusions and central venous pulmonary congestion. Findings similar to comparison exam.   Electronically Signed   By: Genevive Bi M.D.   On: 01/05/2015 17:11     EKG Interpretation   Date/Time:  Thursday January 05 2015 15:10:37 EDT Ventricular Rate:  97 PR Interval:    QRS Duration: 90 QT Interval:  308 QTC Calculation: 391 R Axis:   116 Text Interpretation:  Atrial fibrillation with premature ventricular or  aberrantly conducted complexes Low voltage QRS Possible Anterolateral  infarct , age undetermined Abnormal ECG No significant change was found  Confirmed by [facility]  MD, TREY ([id number]) on 01/05/2015 6:34:53 PM      MDM  Acute Exacerbation of CHF  Recently discharged after lengthy stay for CHF exacerbation.  She reports gaining 8 pounds since discharge.  Has increased SOB, fatigue, DOE.  Is tachypneic at rest on exam and is appears significantly fluid overloaded.    Given  lasix.  Admit to medicine.    Blake Divine, MD 01/06/15 [phone]

## 2015-01-05 NOTE — Progress Notes (Signed)
Patient placed on box 15. Tele was notified.

## 2015-01-05 NOTE — H&P (Signed)
[facility] History and Physical  Melanie Camacho ZOX:[id number] DOB: 1931/10/26 DOA: 01/05/2015  Referring physician: ED physician PCP: PROVIDER NOT IN SYSTEM  Specialists:   Chief Complaint: Shortness of breath and abdominal pain  HPI: Melanie Camacho is a 81 y.o. female with past medical history of hypertension, hyperlipidemia, diet-controlled diabetes, GERD, hypothyroidism, diastolic congestive heart failure (EF 55-60%), CAD, post status of AVR 2011 at [facility], chronic kidney disease-III, A. fib on Eliquis, history of positive TB, anemia, chronic back pain, arthritis, who presents with worsening shortness of breath and abdominal pain.  Patient reports that she was recently hospitalized from 4/6 to 4/22 because of CHF exacerbation. She was treated with aggressive diuretics in hospital. Her body weight decreased by 22 pounds at discharge. Patient was discharged to SNF for rehabilitation. She reports that she is taking torsemide 20 mg daily, but her body weight weight has increased by 8 pounds. Her SOB and leg edema have been progressively worsening. No fever or CP.   Patient also reports having intermittent abdominal pain. The pain is moderate, located over right lower quadrant. It has been going on for several days, Feels like "in female organ". It is not associated with nausea and vomiting. She reports that she had 1 episode of diarrhea on Friday, which has resolved completely. Currently no diarrhea. Currently abdominal pain has resolved.  ROS: currently patient denies fever, chills, running nose, ear pain, headaches, cough, chest pain, constipation, dysuria, urgency, frequency, hematuria, skin rashes. No unilateral weakness, numbness or tingling sensations. No vision change or hearing loss.  In ED, patient was found to have BNP 972, negative troponin, stable renal function, WBC 3.7, temperature 97.5. Chest x-ray showed bilateral pleural effusion with pulmonary edema.  Review of Systems: As  presented in the history of presenting illness, rest negative.  Where does patient live?  SNF Can patient participate in ADLs? none  Allergy:  Allergies  Allergen Reactions  . Morphine And Related     Stevens-Johnsons  . Tape   . Bacitracin Rash  . Betadine [Povidone Iodine] Rash  . Clindamycin/Lincomycin Rash  . Vancomycin Rash    Past Medical History  Diagnosis Date  . Anginal pain   . Hypertension   . Heart murmur   . CHF (congestive heart failure)   . Shortness of breath   . Complication of anesthesia     "I had a slight reaction to the morphine given in surgery"  . High cholesterol   . Coronary artery disease   . Myocardial infarction 12/2013  . Pneumonia "once or twice"  . 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 Type II diabetes mellitus   . Anemia   . History of blood transfusion     "related to the anemia"  . GERD (gastroesophageal reflux disease)   . Arthritis     "all over"  . Chronic lower back pain     Past Surgical History  Procedure Laterality Date  . Left heart catheterization with coronary angiogram N/A 12/28/2013    Procedure: LEFT HEART CATHETERIZATION WITH CORONARY ANGIOGRAM;  Surgeon: Lennette Bihari, MD;  Location: [facility] CATH LAB;  Service: Cardiovascular;  Laterality: N/A;  . Cardiac catheterization    . Cholecystectomy open  1980's  . Appendectomy  ~ 1948  . Tonsillectomy  1954  . Hernia repair    . Abdominal hernia repair  "many times"  . Hemorrhoid banding    . Fracture surgery    .  Foot fracture surgery Right ~ 1980  . Cataract extraction Right     Social History:  reports that she has never smoked. She has never used smokeless tobacco. She reports that she does not drink alcohol or use illicit drugs.  Family History:  Family History  Problem Relation Age of Onset  . Hypertension Mother      Prior to Admission medications   Medication Sig Start Date End Date Taking? Authorizing Provider  apixaban (ELIQUIS) 2.5 MG TABS  tablet Take 1 tablet (2.5 mg total) by mouth 2 (two) times daily. 12/30/14  Yes Leroy Sea, MD  bisoprolol (ZEBETA) 5 MG tablet Take 1 tablet (5 mg total) by mouth daily. Patient taking differently: Take 10 mg by mouth daily.  12/29/13  Yes Rhonda G Barrett, PA-C  Cholecalciferol (VITAMIN D3) 5000 UNITS CAPS Take 5,000 Units by mouth daily.   Yes Historical Provider, MD  colesevelam (WELCHOL) 625 MG tablet Take 1,875 mg by mouth 2 (two) times daily with a meal.   Yes Historical Provider, MD  docusate sodium (COLACE) 100 MG capsule Take 1 capsule (100 mg total) by mouth 2 (two) times daily. 12/27/14  Yes Kathlen Mody, MD  ferrous fumarate (HEMOCYTE - 106 MG FE) 325 (106 FE) MG TABS tablet Take 1 tablet by mouth daily.   Yes Historical Provider, MD  gabapentin (NEURONTIN) 300 MG capsule Take 300 mg by mouth 2 (two) times daily.   Yes Historical Provider, MD  levothyroxine (SYNTHROID, LEVOTHROID) 25 MCG tablet Take 1 tablet (25 mcg total) by mouth daily before breakfast. 12/27/14  Yes Kathlen Mody, MD  Multiple Vitamins-Minerals (ICAPS) CAPS Take 2 capsules by mouth daily.    Yes Historical Provider, MD  oxycodone (OXY-IR) 5 MG capsule Take 1 capsule (5 mg total) by mouth every 6 (six) hours as needed for pain. 12/27/14  Yes Kathlen Mody, MD  pantoprazole (PROTONIX) 40 MG tablet Take 1 tablet (40 mg total) by mouth at bedtime. 12/27/14  Yes Kathlen Mody, MD  potassium chloride SA (K-DUR,KLOR-CON) 20 MEQ tablet Take 2 tablets (40 mEq total) by mouth daily. 12/27/14  Yes Kathlen Mody, MD  rOPINIRole (REQUIP) 0.25 MG tablet Take 1 tablet (0.25 mg total) by mouth 3 (three) times daily as needed (restless leg syndrome). 12/27/14  Yes Kathlen Mody, MD  torsemide (DEMADEX) 20 MG tablet Take 1 tablet (20 mg total) by mouth daily. 12/30/14  Yes Leroy Sea, MD    Physical Exam: Filed Vitals:   01/05/15 1845 01/05/15 1930 01/05/15 2000 01/05/15 2034  BP: 104/72 110/67 109/74 122/60  Pulse: 79 67 99 84   Temp:    97.2 F (36.2 C)  TempSrc:    Oral  Resp: 26 22 26 22   Height:    5\' 2"  (1.575 m)  Weight:    106.051 kg (233 lb 12.8 oz)  SpO2: 96% 96% 95% 93%   General: Not in acute distress HEENT:       Eyes: PERRL, EOMI, no scleral icterus       ENT: No discharge from the ears and nose, no pharynx injection, no tonsillar enlargement.        Neck: Positive JVD, no bruit, no mass felt. Cardiac: S1/S2, RRR, No murmurs, No gallops or rubs Pulm: Good air movement bilaterally. Clear to auscultation bilaterally. No rales, wheezing, rhonchi or rubs. Abd: Soft, nondistended, mild tender over RLQ upon deep palpation, no rebound pain, no organomegaly, BS present Ext: 3+ pitting leg edema bilaterally. 2+DP/PT pulse bilaterally  Musculoskeletal: No joint deformities, erythema, or stiffness, ROM full Skin: No rashes.  Neuro: Alert and oriented X3, cranial nerves II-XII grossly intact, muscle strength 5/5 in all extremeties, sensation to light touch intact.  Psych: Patient is not psychotic, no suicidal or hemocidal ideation.  Labs on Admission:  Basic Metabolic Panel:  Recent Labs Lab 12/30/14 0650 01/05/15 1528  NA 141 141  K 3.9 4.3  CL 101 105  CO2 26 28  GLUCOSE 84 105*  BUN 53* 43*  CREATININE 1.54* 1.32*  CALCIUM 9.1 9.0   Liver Function Tests:  Recent Labs Lab 01/05/15 1528  AST 30  ALT 14  ALKPHOS 74  BILITOT 0.7  PROT 7.0  ALBUMIN 2.9*   No results for input(s): LIPASE, AMYLASE in the last 168 hours. No results for input(s): AMMONIA in the last 168 hours. CBC:  Recent Labs Lab 01/05/15 1528  WBC 3.7*  HGB 10.5*  HCT 34.6*  MCV 104.2*  PLT 148*   Cardiac Enzymes:  Recent Labs Lab 01/05/15 1528  TROPONINI 0.03    BNP (last 3 results)  Recent Labs  12/14/14 1410 12/27/14 0449 01/05/15 1528  BNP 1222.9* 1099.7* 972.1*    ProBNP (last 3 results) No results for input(s): PROBNP in the last 8760 hours.  CBG:  Recent Labs Lab 12/30/14 0730  12/30/14 1238 01/05/15 2105  GLUCAP 80 106* 84    Radiological Exams on Admission: Dg Chest 2 View  01/05/2015   CLINICAL DATA:  Congestive heart failure shortness breath.  EXAM: CHEST  2 VIEW  COMPARISON:  Radiograph 12/27/2014  FINDINGS: Cardiac silhouette is enlarged. There is bilateral pleural effusions similar prior. There is mild central venous pulmonary congestion. Mild basilar atelectasis.  IMPRESSION: Bilateral pleural effusions and central venous pulmonary congestion. Findings similar to comparison exam.   Electronically Signed   By: Genevive Bi M.D.   On: 01/05/2015 17:11    EKG: Independently reviewed.  Abnormal findings: Atrial fibrillation and low voltage.  Assessment/Plan Principal Problem:   Acute on chronic diastolic CHF (congestive heart failure), NYHA class 1 Active Problems:   Chronic a-fib   S/P AVR-(tissue) 2011 at [facility] - no CABG with this   HTN- (transient hypotension during recent admission)   Hypothyroid   DM (diabetes mellitus)   CKD (chronic kidney disease), stage III   Abdominal pain   CHF exacerbation  CHF exacerbation: Patient's shortness of breath, leg edema, positive DVT and pulmonary edema on chest x-ray are all consistent with congestive heart failure exacerbation. No chest pain, making pulmonary embolism unlikely. Patient had 2-D echo on 12/15/14, which showed EF for 55-60%, will not repeat today. -will admit to Telemetry bed. -will treat with IV lasix 80 mg bid -Zebeta -No on ACEI because of CKD -Not on ASA since patient is on blood thinner,Eliquis -strict In/Out -Daily body weight. -heart diet  Atrial Fibrillation: CHA2DS2-VASc Score is 7, needs oral anticoagulation. Patient is on [place] home. Heart rate is well controlled. -will continue Eliquis and Zebeta  CKD-III: Baseline creatinine 1.5. Her creatinine is 1.32 on admission, which is at baseline. -Follow-up renal function by BMP.  Hyperlipidemia: No LDL on  records. Patient is on  Welchol at home -continue home med -check FLP  status post AVR 2011 at [facility]: Stable.  -2-D echo with prosthesis present with normal range of motion and no evidence of dehiscence  Well-controlled diabetes mellitus diet-controlled:  Hemoglobin A1c is 5.5 on 12/15/14 -No treatment needed now  Hypothyroidism: patient was off Synthroid for  3 years, which was restarted in previous admission. In previous admission, TSH obtained was elevated at 7.734. Free T4 decreased at 0.75. Free T3 decreased at 1.6, total T3 was decreased at 66.  -Continue Synthroid 25 mcg daily.  -Patient will need repeat thyroid function studies done in about 4-6 weeks as outpatient.  Gerd: -Protonix  Iron deficiency anemia: hemoglobin is stable,10.4 on 12/21/14-->10.5 today. -Continue iron supplements.   DVT ppx: Eliquis  Code Status: Full code Family Communication:  Yes, patient's   daughter    at bed side Disposition Plan: Admit to inpatient   Date of Service 01/05/2015    Lorretta Harp [facility] Pager [phone]  If 7PM-7AM, please contact night-coverage [email or web address] Password [email or web address] 01/05/2015, 9:15 PM

## 2015-01-05 NOTE — ED Notes (Signed)
No answer x2 

## 2015-01-06 ENCOUNTER — Encounter (HOSPITAL_COMMUNITY): Payer: Self-pay | Admitting: General Practice

## 2015-01-06 ENCOUNTER — Inpatient Hospital Stay (HOSPITAL_COMMUNITY): Payer: Medicare Other

## 2015-01-06 LAB — LIPID PANEL
Cholesterol: 78 mg/dL (ref 0–200)
HDL: 32 mg/dL — ABNORMAL LOW (ref >39)
LDL Cholesterol: 40 mg/dL (ref 0–99)
Total CHOL/HDL Ratio: 2.4 RATIO
Triglycerides: 28 mg/dL (ref <150)
VLDL: 6 mg/dL (ref 0–40)

## 2015-01-06 LAB — GLUCOSE, CAPILLARY
Glucose-Capillary: 97 mg/dL (ref 70–99)
Glucose-Capillary: 98 mg/dL (ref 70–99)
Glucose-Capillary: 96 mg/dL (ref 70–99)
Glucose-Capillary: 84 mg/dL (ref 70–99)

## 2015-01-06 LAB — BASIC METABOLIC PANEL
ANION GAP: 10 (ref 5–15)
BUN: 43 mg/dL — ABNORMAL HIGH (ref 6–23)
CO2: 26 mmol/L (ref 19–32)
CREATININE: 1.21 mg/dL — AB (ref 0.50–1.10)
Calcium: 8.8 mg/dL (ref 8.4–10.5)
Chloride: 105 mmol/L (ref 96–112)
GFR calc Af Amer: 48 mL/min — ABNORMAL LOW (ref >90)
GFR calc non Af Amer: 41 mL/min — ABNORMAL LOW (ref >90)
GLUCOSE: 80 mg/dL (ref 70–99)
Potassium: 4.4 mmol/L (ref 3.5–5.1)
Sodium: 141 mmol/L (ref 135–145)

## 2015-01-06 LAB — PROTIME-INR
INR: 1.64 — AB (ref 0.00–1.49)
Prothrombin Time: 19.6 seconds — ABNORMAL HIGH (ref 11.6–15.2)

## 2015-01-06 MED ORDER — FUROSEMIDE 10 MG/ML IJ SOLN
80.0000 mg | Freq: Two times a day (BID) | INTRAMUSCULAR | Status: DC
  Administered 2015-01-06 – 2015-01-09 (×3): 80 mg via INTRAVENOUS
  Filled 2015-01-06 (×7): qty 8

## 2015-01-06 MED ORDER — IOHEXOL 300 MG/ML  SOLN
25.0000 mL | INTRAMUSCULAR | Status: AC
  Administered 2015-01-06 (×2): 25 mL via ORAL

## 2015-01-06 NOTE — Care Management Note (Unsigned)
    Page 1 of 1   01/06/2015     5:01:13 PM CARE MANAGEMENT NOTE 01/06/2015  Patient:  Melanie Camacho, Melanie Camacho   Account Number:  [id number]  Date Initiated:  01/06/2015  Documentation initiated by:  Jamala Kohen  Subjective/Objective Assessment:   Pt adm on 01/05/15 with CHF exacerbation, abd pain.  PTA, pt resided at Skilled Nursing Facility.     Action/Plan:   Will consult CSW to facilitate return to SNF when medically stable for dc.  Will follow progress.   Anticipated DC Date:  01/10/2015   Anticipated DC Plan:  SKILLED NURSING FACILITY  In-house referral  Clinical Social Worker      DC Planning Services  CM consult      Choice offered to / List presented to:             Status of service:  In process, will continue to follow Medicare Important Message given?  YES (If response is "NO", the following Medicare IM given date fields will be blank) Date Medicare IM given:  01/06/2015 Medicare IM given by:  Wahid Holley Date Additional Medicare IM given:   Additional Medicare IM given by:    Discharge Disposition:    Per UR Regulation:  Reviewed for med. necessity/level of care/duration of stay  If discussed at Long Length of Stay Meetings, dates discussed:    Comments:

## 2015-01-06 NOTE — Progress Notes (Signed)
Pt c/o burning with urination. Foley irrigated. Follow-up UA sent. Foley care to be performed. Will continue to monitor.

## 2015-01-06 NOTE — Progress Notes (Signed)
[facility] PROGRESS NOTE  Melanie Camacho ZOX:[id number] DOB: 1932-07-27 DOA: 01/05/2015 PCP: PROVIDER NOT IN SYSTEM  Assessment/Plan: 1. Acute on chronic diastolic CHF exacerbation: elevated BNP.  - started on IV lasix 80 mg BID.  - strict intake and output.  - daily weight's.  - watch her BMP on IV lasix.    2. Atrial fibrillation; Rate controlled . On eliquis   3. Hypothyroidism: Resume synthroid.   4. Restless leg syndrome: Resume ropinirole.   5. Mild macrocytic anemia; Stable continue to monitor.    Code Status: full code.  Family Communication: none at bedside Disposition Plan: pending PT evaluation.    Consultants:  none  Procedures:  none  Antibiotics:  none  HPI/Subjective: Comfortable, reports being exhausted.   Objective: Filed Vitals:   01/06/15 1433  BP: 109/68  Pulse: 76  Temp: 97.5 F (36.4 C)  Resp: 18    Intake/Output Summary (Last 24 hours) at 01/06/15 1906 Last data filed at 01/06/15 1809  Gross per 24 hour  Intake    954 ml  Output   1050 ml  Net    -96 ml   Filed Weights   01/05/15 1613 01/05/15 2034 01/06/15 0456  Weight: 109.181 kg (240 lb 11.2 oz) 106.051 kg (233 lb 12.8 oz) 105.8 kg (233 lb 4 oz)    Exam:   General:  Alert afebrile comfortable, sitting in the chair.   Cardiovascular: s1s2  Respiratory: diminished air entry at bases.   Abdomen: soft non tender non distended bowel sounds heard  Musculoskeletal: 3+ pedal edema.  Data Reviewed: Basic Metabolic Panel:  Recent Labs Lab 01/05/15 1528 01/06/15 0502  NA 141 141  K 4.3 4.4  CL 105 105  CO2 28 26  GLUCOSE 105* 80  BUN 43* 43*  CREATININE 1.32* 1.21*  CALCIUM 9.0 8.8   Liver Function Tests:  Recent Labs Lab 01/05/15 1528  AST 30  ALT 14  ALKPHOS 74  BILITOT 0.7  PROT 7.0  ALBUMIN 2.9*   No results for input(s): LIPASE, AMYLASE in the last 168 hours. No results for input(s): AMMONIA in the last 168 hours. CBC:  Recent  Labs Lab 01/05/15 1528  WBC 3.7*  HGB 10.5*  HCT 34.6*  MCV 104.2*  PLT 148*   Cardiac Enzymes:  Recent Labs Lab 01/05/15 1528  TROPONINI 0.03   BNP (last 3 results)  Recent Labs  12/14/14 1410 12/27/14 0449 01/05/15 1528  BNP 1222.9* 1099.7* 972.1*    ProBNP (last 3 results) No results for input(s): PROBNP in the last 8760 hours.  CBG:  Recent Labs Lab 01/05/15 2105 01/06/15 0549 01/06/15 1124 01/06/15 1619  GLUCAP 84 97 98 96    Recent Results (from the past 240 hour(s))  Urine culture     Status: None   Collection Time: 12/29/14 12:07 PM  Result Value Ref Range Status   Specimen Description URINE, RANDOM  Final   Special Requests NONE  Final   Colony Count   Final    >=100,000 COLONIES/ML Performed at [facility]    Culture   Final    ESCHERICHIA COLI Performed at [facility]    Report Status 12/31/2014 FINAL  Final   Organism ID, Bacteria ESCHERICHIA COLI  Final      Susceptibility   Escherichia coli - MIC*    AMPICILLIN >=32 RESISTANT Resistant     CEFAZOLIN <=4 SENSITIVE Sensitive     CEFTRIAXONE <=1 SENSITIVE Sensitive     CIPROFLOXACIN >=  4 RESISTANT Resistant     GENTAMICIN >=16 RESISTANT Resistant     LEVOFLOXACIN >=8 RESISTANT Resistant     NITROFURANTOIN <=16 SENSITIVE Sensitive     TOBRAMYCIN >=16 RESISTANT Resistant     TRIMETH/SULFA <=20 SENSITIVE Sensitive     PIP/TAZO 8 SENSITIVE Sensitive     * ESCHERICHIA COLI     Studies: Ct Abdomen Pelvis Wo Contrast  01/06/2015   CLINICAL DATA:  Intermittent right lower quadrant pain for several days.  EXAM: CT ABDOMEN AND PELVIS WITHOUT CONTRAST  TECHNIQUE: Multidetector CT imaging of the abdomen and pelvis was performed following the standard protocol without IV contrast.  COMPARISON:  None.  FINDINGS: BODY WALL: Severe anasarca. The abdominal wall is incompletely imaged but there is no gross fluid collection. Lax abdominal wall with wide necked herniation below the  umbilicus, containing nonobstructed small bowel. Muscular or fascia calcification in the abdominal wall which could be dystrophic or from previous surgery.  LOWER CHEST: Moderate right and small left pleural effusions with multi segment atelectasis.  Aortic valve replacement. There is mild cardiomegaly and extensive coronary atherosclerosis.  ABDOMEN/PELVIS:  Liver: Large liver, with the right lobe projecting to the iliac crest. Rounded masses in the right liver are near water density, excluding a 2 cm lesion in segment 6 which was likely not visualized/evaluated on sonography from 8 days ago.  Biliary: Cholecystectomy. There is suggestion of a choledochoenterostomy, although there is no pneumobilia.  Pancreas: Unremarkable.  Spleen: Unremarkable.  Adrenals: Unremarkable.  Kidneys and ureters: No hydronephrosis or stone.  Bladder: Decompressed by a Foley catheter.  Reproductive: Unremarkable for age.  Bowel: Distal gastrectomy. The anastomoses are patent and there is no surrounding inflammatory change. No pericecal inflammation. No bowel obstruction.  Retroperitoneum: No mass or adenopathy.  Peritoneum: Small perihepatic ascites, likely from volume overload.  Vascular: No acute abnormality.  Diffuse arterial calcification.  OSSEOUS: No acute abnormalities.  IMPRESSION: 1. No acute intra-abdominal findings. 2. Severe anasarca. Moderate right and small left pleural effusions. 3. A 2 cm lesion in segment 6 is indeterminate due to density. Other liver lesions appear cystic. 4. Ventral abdominal hernia containing nonobstructed small bowel. 5. Distal gastrectomy.   Electronically Signed   By: Marnee Spring M.D.   On: 01/06/2015 10:36   Dg Chest 2 View  01/05/2015   CLINICAL DATA:  Congestive heart failure shortness breath.  EXAM: CHEST  2 VIEW  COMPARISON:  Radiograph 12/27/2014  FINDINGS: Cardiac silhouette is enlarged. There is bilateral pleural effusions similar prior. There is mild central venous pulmonary  congestion. Mild basilar atelectasis.  IMPRESSION: Bilateral pleural effusions and central venous pulmonary congestion. Findings similar to comparison exam.   Electronically Signed   By: Genevive Bi M.D.   On: 01/05/2015 17:11    Scheduled Meds: . apixaban  2.5 mg Oral BID  . bisoprolol  10 mg Oral Daily  . colesevelam  1,875 mg Oral BID WC  . docusate sodium  100 mg Oral BID  . ferrous fumarate  1 tablet Oral Daily  . [START ON 01/07/2015] furosemide  80 mg Oral BID  . gabapentin  300 mg Oral BID  . levothyroxine  25 mcg Oral QAC breakfast  . multivitamin-lutein  2 capsule Oral Daily  . pantoprazole  40 mg Oral QHS  . sodium chloride  3 mL Intravenous Q12H   Continuous Infusions:   Principal Problem:   Acute on chronic diastolic CHF (congestive heart failure), NYHA class 1 Active Problems:   Chronic a-fib  S/P AVR-(tissue) 2011 at [facility] - no CABG with this   HTN- (transient hypotension during recent admission)   Hypothyroid   DM (diabetes mellitus)   CKD (chronic kidney disease), stage III   Abdominal pain   CHF exacerbation    Time spent: 35 min    Ryllie Nieland  [facility] Pager [phone]. If 7PM-7AM, please contact night-coverage at [email or web address], password [facility] 01/06/2015, 7:06 PM  LOS: 1 day

## 2015-01-06 NOTE — Progress Notes (Signed)
Utilization review completed. Nallely Yost, RN, BSN. 

## 2015-01-07 ENCOUNTER — Inpatient Hospital Stay (HOSPITAL_COMMUNITY): Payer: Medicare Other

## 2015-01-07 LAB — BASIC METABOLIC PANEL
Anion gap: 13 (ref 5–15)
Anion gap: 9 (ref 5–15)
BUN: 43 mg/dL — ABNORMAL HIGH (ref 6–23)
BUN: 39 mg/dL — ABNORMAL HIGH (ref 6–23)
CALCIUM: 8.9 mg/dL (ref 8.4–10.5)
CO2: 22 mmol/L (ref 19–32)
CO2: 28 mmol/L (ref 19–32)
Calcium: 8.8 mg/dL (ref 8.4–10.5)
Chloride: 106 mmol/L (ref 96–112)
Chloride: 103 mmol/L (ref 96–112)
Creatinine, Ser: 1.37 mg/dL — ABNORMAL HIGH (ref 0.50–1.10)
Creatinine, Ser: 1.42 mg/dL — ABNORMAL HIGH (ref 0.50–1.10)
GFR calc Af Amer: 41 mL/min — ABNORMAL LOW (ref >90)
GFR calc Af Amer: 39 mL/min — ABNORMAL LOW (ref >90)
GFR calc non Af Amer: 35 mL/min — ABNORMAL LOW (ref >90)
GFR calc non Af Amer: 34 mL/min — ABNORMAL LOW (ref >90)
GLUCOSE: 76 mg/dL (ref 70–99)
GLUCOSE: 108 mg/dL — AB (ref 70–99)
POTASSIUM: 3.9 mmol/L (ref 3.5–5.1)
Potassium: 5.5 mmol/L — ABNORMAL HIGH (ref 3.5–5.1)
SODIUM: 140 mmol/L (ref 135–145)
Sodium: 141 mmol/L (ref 135–145)

## 2015-01-07 LAB — URINALYSIS, ROUTINE W REFLEX MICROSCOPIC
Bilirubin Urine: NEGATIVE
Glucose, UA: NEGATIVE mg/dL
HGB URINE DIPSTICK: NEGATIVE
Ketones, ur: NEGATIVE mg/dL
Nitrite: NEGATIVE
PH: 5 (ref 5.0–8.0)
Protein, ur: NEGATIVE mg/dL
SPECIFIC GRAVITY, URINE: 1.007 (ref 1.005–1.030)
Urobilinogen, UA: 0.2 mg/dL (ref 0.0–1.0)

## 2015-01-07 LAB — URINE MICROSCOPIC-ADD ON

## 2015-01-07 LAB — GLUCOSE, CAPILLARY
GLUCOSE-CAPILLARY: 74 mg/dL (ref 70–99)
Glucose-Capillary: 86 mg/dL (ref 70–99)
Glucose-Capillary: 106 mg/dL — ABNORMAL HIGH (ref 70–99)
Glucose-Capillary: 114 mg/dL — ABNORMAL HIGH (ref 70–99)

## 2015-01-07 MED ORDER — SODIUM CHLORIDE 0.9 % IV BOLUS (SEPSIS)
250.0000 mL | Freq: Once | INTRAVENOUS | Status: AC
  Administered 2015-01-07: 250 mL via INTRAVENOUS

## 2015-01-07 MED ORDER — SODIUM POLYSTYRENE SULFONATE 15 GM/60ML PO SUSP
30.0000 g | Freq: Once | ORAL | Status: AC
  Administered 2015-01-07: 30 g via ORAL
  Filled 2015-01-07: qty 120

## 2015-01-07 MED ORDER — PHENAZOPYRIDINE HCL 100 MG PO TABS
100.0000 mg | ORAL_TABLET | Freq: Three times a day (TID) | ORAL | Status: DC | PRN
  Administered 2015-01-07 – 2015-01-14 (×3): 100 mg via ORAL
  Filled 2015-01-07 (×5): qty 1

## 2015-01-07 NOTE — Progress Notes (Signed)
Report given to receiving RN. Transfer to 2C complete.

## 2015-01-07 NOTE — Progress Notes (Signed)
SATURATION QUALIFICATIONS: (This note is used to comply with regulatory documentation for home oxygen)  Patient Saturations on Room Air at Rest =86%   

## 2015-01-07 NOTE — Progress Notes (Addendum)
[facility] PROGRESS NOTE  Melanie Camacho WUJ:[id number] DOB: 1932/05/29 DOA: 01/05/2015 PCP: PROVIDER NOT IN SYSTEM Interim summary: 80 y.o lady with h/o hypertension, diet controlled DM, CAD s/o AVR, stage 3 ckd, afib on eliquis, comes in for abdominal pain and sob. She was evaluated with a CT abdomen and pelvis , which did not reveal any acute intra abdominal findings. There was severe anasarca with moderate right and small left pleural effusions. She was admitted for acute on chronic diastolic heart failure and started on IV lasix.  Earlier this am, she was hypotensive, tachycardic without any symptoms.  Assessment/Plan: 1. Acute on chronic diastolic CHF exacerbation:  - she was initially started on IV lasix 80 mg BID, but holding the lasix in view of her low bp's.  - strict intake and output.  - daily weight's.  - watch her BMP on IV lasix.  - plan to call cardiology in am. .    2. Atrial fibrillation; Rate controlled . On eliquis   3. Hypothyroidism: Resume synthroid.   4. Restless leg syndrome: Resume ropinirole.   5. Mild macrocytic anemia; Stable continue to monitor.   6. Lethargy: unclear etiology.  Patient reports she has been up and awake all night and wants to sleep now. Monitor. She is oriented and answers all question appropriately.   7. Hypotension: unclear etiology, she is afebrile , no leukocytosis, UA is negative for infection. CXR ordered. No source of infection found.  Holding lasix today.   8. Stage 3 CKD; appears to be at baseline.   9. Hyperkalemia; kayexalate ordered and repeat BMP tonight.     Code Status: full code.  Family Communication: daughter at bedside Disposition Plan: pending PT evaluation.    Consultants:  none  Procedures:  none  Antibiotics:  none  HPI/Subjective: Did not have a good night, could not sleep all night. She denies any sob.   Objective: Filed Vitals:   01/07/15 1707  BP: 104/50  Pulse:   Temp:    Resp:     Intake/Output Summary (Last 24 hours) at 01/07/15 1720 Last data filed at 01/07/15 1315  Gross per 24 hour  Intake   1318 ml  Output    825 ml  Net    493 ml   Filed Weights   01/05/15 2034 01/06/15 0456 01/07/15 0451  Weight: 106.051 kg (233 lb 12.8 oz) 105.8 kg (233 lb 4 oz) 108.863 kg (240 lb)    Exam:   General:  Sleepy, in the bed, not in distress off oxygen.   Cardiovascular: s1s2, murmer present.   Respiratory: diminished air entry at bases. No wheezing or rhonchi  Abdomen: soft non tender non distended bowel sounds heard  Musculoskeletal: 3+ pedal edema.  Data Reviewed: Basic Metabolic Panel:  Recent Labs Lab 01/05/15 1528 01/06/15 0502 01/07/15 0444  NA 141 141 141  K 4.3 4.4 5.5*  CL 105 105 106  CO2 GLUCOSE 105* 80 76  BUN 43* 43* 43*  CREATININE 1.32* 1.21* 1.37*  CALCIUM 9.0 8.8 8.9   Liver Function Tests:  Recent Labs Lab 01/05/15 1528  AST 30  ALT 14  ALKPHOS 74  BILITOT 0.7  PROT 7.0  ALBUMIN 2.9*   No results for input(s): LIPASE, AMYLASE in the last 168 hours. No results for input(s): AMMONIA in the last 168 hours. CBC:  Recent Labs Lab 01/05/15 1528  WBC 3.7*  HGB 10.5*  HCT 34.6*  MCV 104.2*  PLT 148*  Cardiac Enzymes:  Recent Labs Lab 01/05/15 1528  TROPONINI 0.03   BNP (last 3 results)  Recent Labs  12/14/14 1410 12/27/14 0449 01/05/15 1528  BNP 1222.9* 1099.7* 972.1*    ProBNP (last 3 results) No results for input(s): PROBNP in the last 8760 hours.  CBG:  Recent Labs Lab 01/06/15 1124 01/06/15 1619 01/06/15 2104 01/07/15 0554 01/07/15 1125  GLUCAP 98 96 84 74 86    Recent Results (from the past 240 hour(s))  Urine culture     Status: None   Collection Time: 12/29/14 12:07 PM  Result Value Ref Range Status   Specimen Description URINE, RANDOM  Final   Special Requests NONE  Final   Colony Count   Final    >=100,000 COLONIES/ML Performed at [facility]     Culture   Final    ESCHERICHIA COLI Performed at [facility]    Report Status 12/31/2014 FINAL  Final   Organism ID, Bacteria ESCHERICHIA COLI  Final      Susceptibility   Escherichia coli - MIC*    AMPICILLIN >=32 RESISTANT Resistant     CEFAZOLIN <=4 SENSITIVE Sensitive     CEFTRIAXONE <=1 SENSITIVE Sensitive     CIPROFLOXACIN >=4 RESISTANT Resistant     GENTAMICIN >=16 RESISTANT Resistant     LEVOFLOXACIN >=8 RESISTANT Resistant     NITROFURANTOIN <=16 SENSITIVE Sensitive     TOBRAMYCIN >=16 RESISTANT Resistant     TRIMETH/SULFA <=20 SENSITIVE Sensitive     PIP/TAZO 8 SENSITIVE Sensitive     * ESCHERICHIA COLI     Studies: Ct Abdomen Pelvis Wo Contrast  01/06/2015   CLINICAL DATA:  Intermittent right lower quadrant pain for several days.  EXAM: CT ABDOMEN AND PELVIS WITHOUT CONTRAST  TECHNIQUE: Multidetector CT imaging of the abdomen and pelvis was performed following the standard protocol without IV contrast.  COMPARISON:  None.  FINDINGS: BODY WALL: Severe anasarca. The abdominal wall is incompletely imaged but there is no gross fluid collection. Lax abdominal wall with wide necked herniation below the umbilicus, containing nonobstructed small bowel. Muscular or fascia calcification in the abdominal wall which could be dystrophic or from previous surgery.  LOWER CHEST: Moderate right and small left pleural effusions with multi segment atelectasis.  Aortic valve replacement. There is mild cardiomegaly and extensive coronary atherosclerosis.  ABDOMEN/PELVIS:  Liver: Large liver, with the right lobe projecting to the iliac crest. Rounded masses in the right liver are near water density, excluding a 2 cm lesion in segment 6 which was likely not visualized/evaluated on sonography from 8 days ago.  Biliary: Cholecystectomy. There is suggestion of a choledochoenterostomy, although there is no pneumobilia.  Pancreas: Unremarkable.  Spleen: Unremarkable.  Adrenals: Unremarkable.   Kidneys and ureters: No hydronephrosis or stone.  Bladder: Decompressed by a Foley catheter.  Reproductive: Unremarkable for age.  Bowel: Distal gastrectomy. The anastomoses are patent and there is no surrounding inflammatory change. No pericecal inflammation. No bowel obstruction.  Retroperitoneum: No mass or adenopathy.  Peritoneum: Small perihepatic ascites, likely from volume overload.  Vascular: No acute abnormality.  Diffuse arterial calcification.  OSSEOUS: No acute abnormalities.  IMPRESSION: 1. No acute intra-abdominal findings. 2. Severe anasarca. Moderate right and small left pleural effusions. 3. A 2 cm lesion in segment 6 is indeterminate due to density. Other liver lesions appear cystic. 4. Ventral abdominal hernia containing nonobstructed small bowel. 5. Distal gastrectomy.   Electronically Signed   By: Marnee Spring M.D.   On: 01/06/2015  10:36    Scheduled Meds: . apixaban  2.5 mg Oral BID  . bisoprolol  10 mg Oral Daily  . colesevelam  1,875 mg Oral BID WC  . docusate sodium  100 mg Oral BID  . ferrous fumarate  1 tablet Oral Daily  . furosemide  80 mg Intravenous BID  . gabapentin  300 mg Oral BID  . levothyroxine  25 mcg Oral QAC breakfast  . multivitamin-lutein  2 capsule Oral Daily  . pantoprazole  40 mg Oral QHS  . sodium chloride  3 mL Intravenous Q12H   Continuous Infusions:   Principal Problem:   Acute on chronic diastolic CHF (congestive heart failure), NYHA class 1 Active Problems:   Chronic a-fib   S/P AVR-(tissue) 2011 at [facility] - no CABG with this   HTN- (transient hypotension during recent admission)   Hypothyroid   DM (diabetes mellitus)   CKD (chronic kidney disease), stage III   Abdominal pain   CHF exacerbation    Time spent: 35 min    Keante Urizar  [facility] Pager [phone]. If 7PM-7AM, please contact night-coverage at [email or web address], password [facility] 01/07/2015, 5:20 PM  LOS: 2 days

## 2015-01-07 NOTE — Progress Notes (Signed)
BP recheck is 86/43. MD notified.

## 2015-01-07 NOTE — Evaluation (Signed)
Physical Therapy Evaluation Patient Details Name: Melanie Camacho MRN: [id number] DOB: Aug 22, 1932 Today's Date: 01/07/2015   History of Present Illness  Pt is an 81 y./o female recently admitted to similar s/s of CHF, now back from SNF rehab with CHF.  Clinical Impression  Pt admitted with/for recurrence of [facility] CHF.  Pt currently limited functionally due to the problems listed. ( See problems list.)   Pt will benefit from PT to maximize function and safety in order to get ready for next venue listed below.     Follow Up Recommendations SNF    Equipment Recommendations  None recommended by PT    Recommendations for Other Services       Precautions / Restrictions Precautions Precaution Comments: very fearful of falling      Mobility  Bed Mobility Overal bed mobility: Needs Assistance;+2 for physical assistance Bed Mobility: Sit to Supine       Sit to supine: +2 for physical assistance;Max assist   General bed mobility comments: needed truncal and LE assist  Transfers Overall transfer level: Needs assistance Equipment used: Rolling walker (2 wheeled) Transfers: Sit to/from [employer] Sit to Stand: Mod assist;+2 physical assistance Stand pivot transfers: Mod assist;+2 physical assistance       General transfer comment: assist to come forward and poser up.  Ambulation/Gait Ambulation/Gait assistance: Min assist;Min guard Ambulation Distance (Feet): 7 Feet Assistive device: Rolling walker (2 wheeled) Gait Pattern/deviations: Shuffle;Decreased step length - right;Decreased step length - left;Decreased stride length Gait velocity: decr   General Gait Details: pt with difficultly advancing either LE  Stairs            Wheelchair Mobility    Modified Rankin (Stroke Patients Only)       Balance Overall balance assessment: Needs assistance   Sitting balance-Leahy Scale: Fair       Standing balance-Leahy Scale: Poor                                Pertinent Vitals/Pain Pain Assessment: Faces Faces Pain Scale: Hurts even more Pain Location: shoulders and other vague aches Pain Descriptors / Indicators: Aching;Grimacing Pain Intervention(s): Monitored during session;Repositioned    Home Living Family/patient expects to be discharged to:: Skilled nursing facility Living Arrangements: Children Available Help at Discharge: Family;Available 24 hours/day Type of Home: House Home Access: Ramped entrance     Home Layout: One level Home Equipment: Toilet riser;Walker - 4 wheels;Walker - 2 wheels;Shower seat      Prior Function Level of Independence: Needs assistance   Gait / Transfers Assistance Needed: assisted with RW at SNF           Hand Dominance        Extremity/Trunk Assessment   Upper Extremity Assessment: Defer to OT evaluation           Lower Extremity Assessment: Generalized weakness (edematous legs and lower trunk)      Cervical / Trunk Assessment: Kyphotic  Communication   Communication: No difficulties  Cognition Arousal/Alertness: Awake/alert Behavior During Therapy: WFL for tasks assessed/performed Overall Cognitive Status: Within Functional Limits for tasks assessed                      General Comments General comments (skin integrity, edema, etc.): SpO2 on RA dropped to 87%    Exercises        Assessment/Plan    PT Assessment Patient needs continued PT  services  PT Diagnosis Difficulty walking;Generalized weakness   PT Problem List Decreased strength;Decreased activity tolerance;Decreased balance;Decreased mobility;Decreased knowledge of use of DME;Decreased safety awareness;Cardiopulmonary status limiting activity  PT Treatment Interventions Gait training;DME instruction;Functional mobility training;Therapeutic activities;Balance training;Patient/family education   PT Goals (Current goals can be found in the Care Plan section) Acute Rehab PT  Goals Patient Stated Goal: get stronger PT Goal Formulation: With patient Time For Goal Achievement: 01/21/15 Potential to Achieve Goals: Fair    Frequency Min 2X/week   Barriers to discharge        Co-evaluation               End of Session Equipment Utilized During Treatment: Gait belt Activity Tolerance: Patient tolerated treatment well;Patient limited by fatigue Patient left: in bed;with call bell/phone within reach;with family/visitor present Nurse Communication: Mobility status         Time: 1448-1530 PT Time Calculation (min) (ACUTE ONLY): 42 min   Charges:   PT Evaluation $Initial PT Evaluation Tier I: 1 Procedure PT Treatments $Gait Training: 8-22 mins $Therapeutic Activity: 8-22 mins   PT G Codes:        Essie Lagunes, Eliseo Gum 01/07/2015, 3:41 PM 01/07/2015  [place] Bing, PT [phone]  (pager)

## 2015-01-07 NOTE — Progress Notes (Signed)
BP 82/50. Patient is asymptomatic at the time. No signs or symptoms of dizziness. No verbal complaints. MD notified. New orders given. Will continue to monitor patient for further changes in condition.

## 2015-01-07 NOTE — Progress Notes (Signed)
BP 78/56. Patient is asymptomatic at the time. No signs or symptoms of dizziness. No verbal complaints. MD notified. New orders given. Will continue to monitor patient for further changes in condition.

## 2015-01-08 LAB — GLUCOSE, CAPILLARY
GLUCOSE-CAPILLARY: 74 mg/dL (ref 70–99)
GLUCOSE-CAPILLARY: 87 mg/dL (ref 70–99)
GLUCOSE-CAPILLARY: 102 mg/dL — AB (ref 70–99)
Glucose-Capillary: 117 mg/dL — ABNORMAL HIGH (ref 70–99)

## 2015-01-08 LAB — BASIC METABOLIC PANEL
ANION GAP: 10 (ref 5–15)
BUN: 41 mg/dL — AB (ref 6–20)
CALCIUM: 8.8 mg/dL — AB (ref 8.9–10.3)
CO2: 26 mmol/L (ref 22–32)
Chloride: 105 mmol/L (ref 101–111)
Creatinine, Ser: 1.36 mg/dL — ABNORMAL HIGH (ref 0.44–1.00)
GFR calc non Af Amer: 36 mL/min — ABNORMAL LOW (ref >60)
GFR, EST AFRICAN AMERICAN: 41 mL/min — AB (ref >60)
Glucose, Bld: 80 mg/dL (ref 70–99)
Potassium: 3.7 mmol/L (ref 3.5–5.1)
Sodium: 141 mmol/L (ref 135–145)

## 2015-01-08 LAB — CBC
HEMATOCRIT: 34.3 % — AB (ref 36.0–46.0)
Hemoglobin: 10.3 g/dL — ABNORMAL LOW (ref 12.0–15.0)
MCH: 30.7 pg (ref 26.0–34.0)
MCHC: 30 g/dL (ref 30.0–36.0)
MCV: 102.1 fL — AB (ref 78.0–100.0)
Platelets: 148 10*3/uL — ABNORMAL LOW (ref 150–400)
RBC: 3.36 MIL/uL — ABNORMAL LOW (ref 3.87–5.11)
RDW: 16.3 % — AB (ref 11.5–15.5)
WBC: 4.4 10*3/uL (ref 4.0–10.5)

## 2015-01-08 LAB — URINE CULTURE
Colony Count: NO GROWTH
Culture: NO GROWTH

## 2015-01-08 NOTE — Consult Note (Signed)
Consulting cardiologist: Dr Melanie Rich MD  Clinical Summary Melanie Camacho is a 81 y.o.female hx of bioprosthetic AVR in 2011, chronic afib on eliquis, HTN, DM,  CKD3, hypoalbuminemia, admitted with abdominal pain and SOB. Admit in April with volume overload, diuresed 22 liters. From notes there were problems getting reliable weights during that admission. Discharged to rehab, however in just a few days she noticed recurrence of LE edema, abdominal swelling, and abdominal pain.    12/2013 cath: no obstructive coronary disease 12/2014 echo: LVEF 55-60%, no WMAs, normal AVR. Diastolic function not described Current labs: K 3.7, Cr 1.36, BUN 31, GFR 36, Hgb 10.3, Plt 148, BNP down from 972, albumin 2.9 CXR bilateral effusions, pulm edema CT A/P: severe anasarca, moderate right and small left pleural effusion EKG afib, low voltage, poor R-wave progression    Allergies  Allergen Reactions  . Morphine And Related     Stevens-Johnsons  . Tape   . Bacitracin Rash  . Betadine [Povidone Iodine] Rash  . Clindamycin/Lincomycin Rash  . Vancomycin Rash    Medications Scheduled Medications: . apixaban  2.5 mg Oral BID  . bisoprolol  10 mg Oral Daily  . colesevelam  1,875 mg Oral BID WC  . docusate sodium  100 mg Oral BID  . ferrous fumarate  1 tablet Oral Daily  . furosemide  80 mg Intravenous BID  . gabapentin  300 mg Oral BID  . levothyroxine  25 mcg Oral QAC breakfast  . multivitamin-lutein  2 capsule Oral Daily  . pantoprazole  40 mg Oral QHS  . sodium chloride  3 mL Intravenous Q12H     Infusions:     PRN Medications:  sodium chloride, acetaminophen, ondansetron (ZOFRAN) IV, oxyCODONE, phenazopyridine, rOPINIRole, sodium chloride   Past Medical History  Diagnosis Date  . Anginal pain   . Hypertension   . Heart murmur   . CHF (congestive heart failure)   . Shortness of breath   . Complication of anesthesia     "I had a slight reaction to the morphine given in  surgery"  . High cholesterol   . Coronary artery disease   . Myocardial infarction 12/2013  . Pneumonia "once or twice"  . 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 Type II diabetes mellitus   . Anemia   . History of blood transfusion     "related to the anemia"  . GERD (gastroesophageal reflux disease)   . Arthritis     "all over"  . Chronic lower back pain     Past Surgical History  Procedure Laterality Date  . Left heart catheterization with coronary angiogram N/A 12/28/2013    Procedure: LEFT HEART CATHETERIZATION WITH CORONARY ANGIOGRAM;  Surgeon: Melanie Bihari, MD;  Location: [facility] CATH LAB;  Service: Cardiovascular;  Laterality: N/A;  . Cardiac catheterization    . Cholecystectomy open  1980's  . Appendectomy  ~ 1948  . Tonsillectomy  1954  . Hernia repair    . Abdominal hernia repair  "many times"  . Hemorrhoid banding    . Fracture surgery    . Foot fracture surgery Right ~ 1980  . Cataract extraction Right     Family History  Problem Relation Age of Onset  . Hypertension Mother     Social History Melanie Camacho reports that she has never smoked. She has never used smokeless tobacco. Melanie Camacho reports that she does not drink alcohol.  Review of Systems  CONSTITUTIONAL: No weight loss, fever, chills, weakness or fatigue.  HEENT: Eyes: No visual loss, blurred vision, double vision or yellow sclerae. No hearing loss, sneezing, congestion, runny nose or sore throat.  SKIN: No rash or itching.  CARDIOVASCULAR: no chest pain.  RESPIRATORY: No cough or sputum.  GASTROINTESTINAL: + abdominal pain.  GENITOURINARY: no polyuria, no dysuria NEUROLOGICAL: No headache, dizziness, syncope, paralysis, ataxia, numbness or tingling in the extremities. No change in bowel or bladder control.  MUSCULOSKELETAL: No muscle, back pain, joint pain or stiffness.  HEMATOLOGIC: No anemia, bleeding or bruising.  LYMPHATICS: No enlarged nodes. No history of splenectomy.  PSYCHIATRIC: No  history of depression or anxiety.      Physical Examination Blood pressure 118/74, pulse 70, temperature 97.5 F (36.4 C), temperature source Oral, resp. rate 11, height 5\' 2"  (1.575 m), weight 246 lb 14.6 oz (112 kg), SpO2 100 %.  Intake/Output Summary (Last 24 hours) at 01/08/15 1406 Last data filed at 01/08/15 1300  Gross per 24 hour  Intake      6 ml  Output    625 ml  Net   -619 ml    HEENT: sclera clear  Cardiovascular: irreg, 2/6 systolic murmur RUSB, JVD to angle of jaw  Respiratory: crackles bilateral bases  GI: abdomen distended, NT  MSK: 1-2+ bilateral edema  Neuro: no focal deficits    Lab Results  Basic Metabolic Panel:  Recent Labs Lab 01/05/15 1528 01/06/15 0502 01/07/15 0444 01/07/15 1837 01/08/15 0419  NA 141 141 141 140 141  K 4.3 4.4 5.5* 3.9 3.7  CL 105 105 106 103 105  CO2 28 26 22 28 26   GLUCOSE 105* 80 76 108* 80  BUN 43* 43* 43* 39* 41*  CREATININE 1.32* 1.21* 1.37* 1.42* 1.36*  CALCIUM 9.0 8.8 8.9 8.8 8.8*    Liver Function Tests:  Recent Labs Lab 01/05/15 1528  AST 30  ALT 14  ALKPHOS 74  BILITOT 0.7  PROT 7.0  ALBUMIN 2.9*    CBC:  Recent Labs Lab 01/05/15 1528 01/08/15 0419  WBC 3.7* 4.4  HGB 10.5* 10.3*  HCT 34.6* 34.3*  MCV 104.2* 102.1*  PLT 148* 148*    Cardiac Enzymes:  Recent Labs Lab 01/05/15 1528  TROPONINI 0.03    BNP: Invalid input(s): POCBNP     Impression/Recommendations  1. Acute on chronic diastolic HF - echo 12/15/14 with LVEF 55-60%, normal functioning AVR. Diastolic function not described in setting of afib - CXR with pulm edema, BNP  - CT abdomen show 972 (down from 1099). Wts have been unreliable with her form prior admission and this admit.  - on lasix 80mg  bid, negative 322 mL since admission. Cr and BUN overall stable, however has been having some soft bp's at times.  - abdominal pain could be hepatic congestion - reviewed prior echo, RV poorly visualized but appears to  be enlarged with probable systolic dysfunction (no TAPSE, tricuspid tissue anular velocity reported 5 cm/s. Will repeat limited echo to get better pictures of RV, if significant dysfunction could explain problems with hypotension with diuresis. Continue current diuretics.   2. Afib - rate controlled with bisoprolol, anticcoag with eliquis.   3. Bioprosthetic AVR - normal function on recent echo  Melanie Camacho, M.D.

## 2015-01-08 NOTE — Progress Notes (Signed)
[facility] PROGRESS NOTE  Melanie Camacho HUD:[id number] DOB: [birth year] DOA: 01/05/2015 PCP: PROVIDER NOT IN SYSTEM Interim summary: 81 y.o lady with h/o hypertension, diet controlled DM, CAD s/o AVR, stage 3 ckd, afib on eliquis, comes in for abdominal pain and sob. She was evaluated with a CT abdomen and pelvis , which did not reveal any acute intra abdominal findings. There was severe anasarca with moderate right and small left pleural effusions. She was admitted for acute on chronic diastolic heart failure and started on IV lasix.  Earlier this am, she was hypotensive, tachycardic without any symptoms.  Assessment/Plan: 1. Acute on chronic diastolic CHF exacerbation:  - she was initially started on IV lasix 80 mg BID, we have been holding lasix in view of her soft BP measurements, will decrease the dose of lasix to 40 MG bid.   - strict intake and output.  - daily weight's.  - watch her BMP on IV lasix.  CARDIOLOGY consulted to see if she needs dobutamine drip/ lasix drip.    2. Atrial fibrillation; Rate controlled . On eliquis   3. Hypothyroidism: Resume synthroid.   4. Restless leg syndrome: Resume ropinirole.   5. Mild macrocytic anemia; Stable continue to monitor.   6. Lethargy: unclear etiology.  Patient reports she has been up and awake all night and wants to sleep now. Monitor. She is oriented and answers all question appropriately.   7. Hypotension: unclear etiology, she is afebrile , no leukocytosis, UA is negative for infection. CXR ordered. No source of infection found.  Holding lasix today.   8. Stage 3 CKD; appears to be at baseline.   9. Hyperkalemia; kayexalate ordered and repeat BMP showed resolution of the hyperkalemia.    Code Status: full code.  Family Communication: none at bedside Disposition Plan: pending PT evaluation.    Consultants:  none  Procedures:  none  Antibiotics:  none  HPI/Subjective: Reports feeling better than  yesterday.   Objective: Filed Vitals:   01/08/15 0931  BP: 82/49  Pulse: 85  Temp:   Resp: 20    Intake/Output Summary (Last 24 hours) at 01/08/15 1231 Last data filed at 01/08/15 0934  Gross per 24 hour  Intake    246 ml  Output    300 ml  Net    -54 ml   Filed Weights   01/06/15 0456 01/07/15 0451 01/08/15 0600  Weight: 105.8 kg (233 lb 4 oz) 108.863 kg (240 lb) 112 kg (246 lb 14.6 oz)    Exam:   General:  Sleepy, in the bed, not in distress off oxygen.   Cardiovascular: s1s2, murmer present.   Respiratory: diminished air entry at bases. No wheezing or rhonchi  Abdomen: soft non tender non distended bowel sounds heard  Musculoskeletal: 3+ pedal edema.  Data Reviewed: Basic Metabolic Panel:  Recent Labs Lab 01/05/15 1528 01/06/15 0502 01/07/15 0444 01/07/15 1837 01/08/15 0419  NA 141 141 141 140 141  K 4.3 4.4 5.5* 3.9 3.7  CL 105 105 106 103 105  CO2 28 26 22 28 26   GLUCOSE 105* 80 76 108* 80  BUN 43* 43* 43* 39* 41*  CREATININE 1.32* 1.21* 1.37* 1.42* 1.36*  CALCIUM 9.0 8.8 8.9 8.8 8.8*   Liver Function Tests:  Recent Labs Lab 01/05/15 1528  AST 30  ALT 14  ALKPHOS 74  BILITOT 0.7  PROT 7.0  ALBUMIN 2.9*   No results for input(s): LIPASE, AMYLASE in the last 168 hours. No results for  input(s): AMMONIA in the last 168 hours. CBC:  Recent Labs Lab 01/05/15 1528 01/08/15 0419  WBC 3.7* 4.4  HGB 10.5* 10.3*  HCT 34.6* 34.3*  MCV 104.2* 102.1*  PLT 148* 148*   Cardiac Enzymes:  Recent Labs Lab 01/05/15 1528  TROPONINI 0.03   BNP (last 3 results)  Recent Labs  12/14/14 1410 12/27/14 0449 01/05/15 1528  BNP 1222.9* 1099.7* 972.1*    ProBNP (last 3 results) No results for input(s): PROBNP in the last 8760 hours.  CBG:  Recent Labs Lab 01/07/15 0554 01/07/15 1125 01/07/15 1708 01/07/15 2129 01/08/15 0727  GLUCAP 74 86 106* 114* 74    Recent Results (from the past 240 hour(s))  Culture, Urine     Status: None    Collection Time: 01/07/15 12:30 AM  Result Value Ref Range Status   Specimen Description URINE, CATHETERIZED  Final   Special Requests n/a Immunocompromised  Final   Colony Count NO GROWTH Performed at [facility]   Final   Culture NO GROWTH Performed at [facility]   Final   Report Status 01/08/2015 FINAL  Final     Studies: Dg Chest Port 1 View  01/07/2015   CLINICAL DATA:  Respiratory difficulty. Cough. Congestion. Cardiomegaly and CHF. No fever. No chest pain.  EXAM: PORTABLE CHEST - 1 VIEW  COMPARISON:  01/05/2015  FINDINGS: Heart is enlarged. There is moderate right pleural effusion associated right basilar atelectasis. Small left pleural effusion is present. There are changes of interstitial edema.  IMPRESSION: 1. Cardiomegaly and interstitial edema. 2. Persistent bibasilar opacity, right greater than left.   Electronically Signed   By: Norva Pavlov M.D.   On: 01/07/2015 18:02    Scheduled Meds: . apixaban  2.5 mg Oral BID  . bisoprolol  10 mg Oral Daily  . colesevelam  1,875 mg Oral BID WC  . docusate sodium  100 mg Oral BID  . ferrous fumarate  1 tablet Oral Daily  . furosemide  80 mg Intravenous BID  . gabapentin  300 mg Oral BID  . levothyroxine  25 mcg Oral QAC breakfast  . multivitamin-lutein  2 capsule Oral Daily  . pantoprazole  40 mg Oral QHS  . sodium chloride  3 mL Intravenous Q12H   Continuous Infusions:   Principal Problem:   Acute on chronic diastolic CHF (congestive heart failure), NYHA class 1 Active Problems:   Chronic a-fib   S/P AVR-(tissue) 2011 at [facility] - no CABG with this   HTN- (transient hypotension during recent admission)   Hypothyroid   DM (diabetes mellitus)   CKD (chronic kidney disease), stage III   Abdominal pain   CHF exacerbation    Time spent: 35 min    Minnetta Sandora  [facility] Pager [phone]. If 7PM-7AM, please contact night-coverage at [email or web address], password [facility] 01/08/2015, 12:31 PM  LOS:  3 days

## 2015-01-09 DIAGNOSIS — I50810 Right heart failure, unspecified: Secondary | ICD-10-CM | POA: Insufficient documentation

## 2015-01-09 DIAGNOSIS — R601 Generalized edema: Secondary | ICD-10-CM

## 2015-01-09 LAB — CBC
HCT: 34.2 % — ABNORMAL LOW (ref 36.0–46.0)
HEMOGLOBIN: 10.3 g/dL — AB (ref 12.0–15.0)
MCH: 31 pg (ref 26.0–34.0)
MCHC: 30.1 g/dL (ref 30.0–36.0)
MCV: 103 fL — ABNORMAL HIGH (ref 78.0–100.0)
Platelets: 146 10*3/uL — ABNORMAL LOW (ref 150–400)
RBC: 3.32 MIL/uL — ABNORMAL LOW (ref 3.87–5.11)
RDW: 16.3 % — AB (ref 11.5–15.5)
WBC: 3.7 10*3/uL — AB (ref 4.0–10.5)

## 2015-01-09 LAB — BASIC METABOLIC PANEL
ANION GAP: 9 (ref 5–15)
BUN: 38 mg/dL — ABNORMAL HIGH (ref 6–20)
CHLORIDE: 105 mmol/L (ref 101–111)
CO2: 28 mmol/L (ref 22–32)
CREATININE: 1.29 mg/dL — AB (ref 0.44–1.00)
Calcium: 8.7 mg/dL — ABNORMAL LOW (ref 8.9–10.3)
GFR calc Af Amer: 44 mL/min — ABNORMAL LOW (ref >60)
GFR calc non Af Amer: 38 mL/min — ABNORMAL LOW (ref >60)
GLUCOSE: 87 mg/dL (ref 70–99)
Potassium: 3.3 mmol/L — ABNORMAL LOW (ref 3.5–5.1)
SODIUM: 142 mmol/L (ref 135–145)

## 2015-01-09 LAB — TROPONIN I
TROPONIN I: 0.03 ng/mL (ref <0.031)
Troponin I: 0.03 ng/mL (ref <0.031)
Troponin I: 0.03 ng/mL (ref <0.031)

## 2015-01-09 LAB — MAGNESIUM: Magnesium: 2.2 mg/dL (ref 1.7–2.4)

## 2015-01-09 LAB — GLUCOSE, CAPILLARY
Glucose-Capillary: 76 mg/dL (ref 70–99)
Glucose-Capillary: 62 mg/dL — ABNORMAL LOW (ref 70–99)
Glucose-Capillary: 90 mg/dL (ref 70–99)
Glucose-Capillary: 79 mg/dL (ref 70–99)

## 2015-01-09 MED ORDER — PROSIGHT PO TABS
2.0000 | ORAL_TABLET | Freq: Every day | ORAL | Status: DC
  Administered 2015-01-09 – 2015-01-27 (×19): 2 via ORAL
  Filled 2015-01-09 (×20): qty 2

## 2015-01-09 MED ORDER — GI COCKTAIL ~~LOC~~
30.0000 mL | Freq: Three times a day (TID) | ORAL | Status: DC | PRN
  Administered 2015-01-23 – 2015-01-26 (×4): 30 mL via ORAL
  Filled 2015-01-09 (×7): qty 30

## 2015-01-09 MED ORDER — PANTOPRAZOLE SODIUM 40 MG PO TBEC
40.0000 mg | DELAYED_RELEASE_TABLET | Freq: Two times a day (BID) | ORAL | Status: DC
  Administered 2015-01-09 – 2015-01-27 (×36): 40 mg via ORAL
  Filled 2015-01-09 (×36): qty 1

## 2015-01-09 MED ORDER — FUROSEMIDE 10 MG/ML IJ SOLN: 40.0000 mg | Freq: Two times a day (BID) | INTRAMUSCULAR | Status: DC

## 2015-01-09 MED ORDER — HYDROCOD POLST-CPM POLST ER 10-8 MG/5ML PO SUER
5.0000 mL | Freq: Once | ORAL | Status: AC
  Administered 2015-01-09: 5 mL via ORAL
  Filled 2015-01-09: qty 5

## 2015-01-09 MED ORDER — BISOPROLOL FUMARATE 5 MG PO TABS
5.0000 mg | ORAL_TABLET | Freq: Every day | ORAL | Status: DC
  Administered 2015-01-10: 5 mg via ORAL
  Filled 2015-01-09: qty 1

## 2015-01-09 MED ORDER — FUROSEMIDE 10 MG/ML IJ SOLN
40.0000 mg | Freq: Two times a day (BID) | INTRAMUSCULAR | Status: DC
  Administered 2015-01-09 – 2015-01-10 (×2): 40 mg via INTRAVENOUS
  Filled 2015-01-09 (×2): qty 4

## 2015-01-09 MED ORDER — POTASSIUM CHLORIDE CRYS ER 20 MEQ PO TBCR
40.0000 meq | EXTENDED_RELEASE_TABLET | Freq: Once | ORAL | Status: AC
  Administered 2015-01-09: 40 meq via ORAL
  Filled 2015-01-09: qty 2

## 2015-01-09 NOTE — Progress Notes (Signed)
Orthopedic Tech Progress Note Patient Details:  Melanie Camacho 17-May-1932 [id number] Applied bilateral Unna boots.  Bilateral pulses, sensation, motion intact before and after application.  Bilateral capillary refill less than 2 seconds before and after application. Ortho Devices Type of Ortho Device: Radio broadcast assistant Ortho Device/Splint Location: Bilateral LE Ortho Device/Splint Interventions: Application   Lesle Chris 01/09/2015, 10:19 PM

## 2015-01-09 NOTE — Progress Notes (Signed)
[facility] PROGRESS NOTE  Melanie Camacho GNF:[id number] DOB: 1931/09/24 DOA: 01/05/2015 PCP: PROVIDER NOT IN SYSTEM Interim summary: 81 y.o lady with h/o hypertension, diet controlled DM, CAD s/o AVR, stage 3 ckd, afib on eliquis, comes in for abdominal pain and sob. She was evaluated with a CT abdomen and pelvis , which did not reveal any acute intra abdominal findings. There was severe anasarca with moderate right and small left pleural effusions. She was admitted for acute on chronic diastolic heart failure and started on IV lasix. From 4/30 and 5/1, patient was hypotensive, tachycardic and her lasix and BB was held. Her bp parameters this am were also borderline and we had to decrease the lasix to 40 mg IV BID.  Assessment/Plan: 1. Acute on chronic diastolic CHF exacerbation:  - she was initially started on IV lasix 80 mg BID, HELD THE lasix and BB the last 2 days. Her BP parameters still borderline and i decreased her lasix to 40 mg BID, so that bp doesn't bottom down like the last two days.  . Her urine output has been a 1050 yesterday and her net fluid is -245 since admission.  - cardiology consulted for recommendations.  - echocardiogram ordered and pending.   2. Atrial fibrillation; Rate controlled . On eliquis   3. Hypothyroidism: Resume synthroid.   4. Restless leg syndrome: Resume ropinirole.   5. Mild macrocytic anemia; Stable continue to monitor.   6. Lethargy: unclear etiology, resolved.  Patient reports she has been up and awake all night and wants to sleep now. Monitor. She is oriented and answers all question appropriately.   7. Hypotension:  Improved. unclear etiology, she is afebrile , no leukocytosis, UA is negative for infection. CXR ordered. No source of infection found.    8. Stage 3 CKD; appears to be at baseline.   9. Hyperkalemia; kayexalate ordered and repeat BMP showed resolution of the hyperkalemia.  10. Chest discomfort last night associated with  lot o belching on laying down and heartburn: She is already on protonix, plan to increase the dose to BID and added GI cocktail to her regimen. EKG does not show any ischemic changes and troponins have been negative.   11. Hypokalemia: cautious replacement, as she was hyperkalemic earlier this admission.   Code Status: full code.  Family Communication: none at bedside Disposition Plan: pending PT evaluation.    Consultants:  none  Procedures:  none  Antibiotics:  none  HPI/Subjective: She reports chest  Discomfort, belching and heartburn while laying flat.   Objective: Filed Vitals:   01/09/15 1300  BP: 105/50  Pulse: 69  Temp:   Resp: 18    Intake/Output Summary (Last 24 hours) at 01/09/15 1350 Last data filed at 01/09/15 0500  Gross per 24 hour  Intake    840 ml  Output    725 ml  Net    115 ml   Filed Weights   01/07/15 0451 01/08/15 0600 01/09/15 0500  Weight: 108.863 kg (240 lb) 112 kg (246 lb 14.6 oz) 115 kg (253 lb 8.5 oz)    Exam:   General:  Awake and sitting at the edge of the bed, feeling better than yesterday.   Cardiovascular: s1s2, .   Respiratory: diminished air entry at bases. No wheezing or rhonchi  Abdomen: soft non tender non distended bowel sounds heard  Musculoskeletal: 3+ pedal edema.  Data Reviewed: Basic Metabolic Panel:  Recent Labs Lab 01/06/15 0502 01/07/15 0444 01/07/15 1837 01/08/15 0419 01/09/15  0305  NA 141 141 140 141 142  K 4.4 5.5* 3.9 3.7 3.3*  CL 105 106 103 105 105  CO2 26 22 28 26 28   GLUCOSE 80 76 108* 80 87  BUN 43* 43* 39* 41* 38*  CREATININE 1.21* 1.37* 1.42* 1.36* 1.29*  CALCIUM 8.8 8.9 8.8 8.8* 8.7*   Liver Function Tests:  Recent Labs Lab 01/05/15 1528  AST 30  ALT 14  ALKPHOS 74  BILITOT 0.7  PROT 7.0  ALBUMIN 2.9*   No results for input(s): LIPASE, AMYLASE in the last 168 hours. No results for input(s): AMMONIA in the last 168 hours. CBC:  Recent Labs Lab 01/05/15 1528  01/08/15 0419 01/09/15 0305  WBC 3.7* 4.4 3.7*  HGB 10.5* 10.3* 10.3*  HCT 34.6* 34.3* 34.2*  MCV 104.2* 102.1* 103.0*  PLT 148* 148* 146*   Cardiac Enzymes:  Recent Labs Lab 01/05/15 1528 01/09/15 0305 01/09/15 0910  TROPONINI 0.03 0.03 0.03   BNP (last 3 results)  Recent Labs  12/14/14 1410 12/27/14 0449 01/05/15 1528  BNP 1222.9* 1099.7* 972.1*    ProBNP (last 3 results) No results for input(s): PROBNP in the last 8760 hours.  CBG:  Recent Labs Lab 01/08/15 1635 01/08/15 2201 01/09/15 0833 01/09/15 1238 01/09/15 1308  GLUCAP 117* 102* 76 62* 90    Recent Results (from the past 240 hour(s))  Culture, Urine     Status: None   Collection Time: 01/07/15 12:30 AM  Result Value Ref Range Status   Specimen Description URINE, CATHETERIZED  Final   Special Requests n/a Immunocompromised  Final   Colony Count NO GROWTH Performed at [facility]   Final   Culture NO GROWTH Performed at [facility]   Final   Report Status 01/08/2015 FINAL  Final     Studies: Dg Chest Port 1 View  01/07/2015   CLINICAL DATA:  Respiratory difficulty. Cough. Congestion. Cardiomegaly and CHF. No fever. No chest pain.  EXAM: PORTABLE CHEST - 1 VIEW  COMPARISON:  01/05/2015  FINDINGS: Heart is enlarged. There is moderate right pleural effusion associated right basilar atelectasis. Small left pleural effusion is present. There are changes of interstitial edema.  IMPRESSION: 1. Cardiomegaly and interstitial edema. 2. Persistent bibasilar opacity, right greater than left.   Electronically Signed   By: Norva Pavlov M.D.   On: 01/07/2015 18:02    Scheduled Meds: . apixaban  2.5 mg Oral BID  . [START ON 01/10/2015] bisoprolol  5 mg Oral Daily  . colesevelam  1,875 mg Oral BID WC  . docusate sodium  100 mg Oral BID  . ferrous fumarate  1 tablet Oral Daily  . furosemide  40 mg Intravenous BID  . gabapentin  300 mg Oral BID  . levothyroxine  25 mcg Oral QAC breakfast   . multivitamin  2 tablet Oral Daily  . pantoprazole  40 mg Oral QHS  . sodium chloride  3 mL Intravenous Q12H   Continuous Infusions:   Principal Problem:   Acute on chronic diastolic CHF (congestive heart failure), NYHA class 1 Active Problems:   Chronic a-fib   S/P AVR-(tissue) 2011 at [facility] - no CABG with this   HTN- (transient hypotension during recent admission)   Hypothyroid   DM (diabetes mellitus)   CKD (chronic kidney disease), stage III   Abdominal pain   CHF exacerbation    Time spent: 35 min    Lenay Lovejoy  [facility] Pager [phone]. If 7PM-7AM, please contact night-coverage  at [email or web address], password [facility] 01/09/2015, 1:50 PM  LOS: 4 days

## 2015-01-09 NOTE — Progress Notes (Signed)
Reviewed echo 12/15/2014. I think the RV is severely dilated (approx. 5.5 cm diameter) and moderate to severely depressed. The D-shaped intraventricular septum suggests RV pressure overload although the TR jet velocity is not particularly high. The PA pressure was underestimated in part due to underestimation of the RA pressure (IVC was actually plethoric). Appears to have right heart failure primarily, which explains hypotension with attempts at diuresis..  May need inotropic support (milrinone) to allow diuresis, due to hypotension. Will ask for CHF consult and discuss RHC with Dr. Gala Romney.  Thurmon Fair, MD, [facility] [phone] office [phone] pager

## 2015-01-09 NOTE — Progress Notes (Signed)
Peripherally Inserted Central Catheter/Midline Placement  The IV Nurse has discussed with the patient and/or persons authorized to consent for the patient, the purpose of this procedure and the potential benefits and risks involved with this procedure.  The benefits include less needle sticks, lab draws from the catheter and patient may be discharged home with the catheter.  Risks include, but not limited to, infection, bleeding, blood clot (thrombus formation), and puncture of an artery; nerve damage and irregular heat beat.  Alternatives to this procedure were also discussed.  PICC/Midline Placement Documentation  PICC Triple Lumen 01/09/15 PICC Left Cephalic 40 cm (Active)       Diedre Maclellan, Lajean Manes 01/09/2015, 8:09 PM

## 2015-01-09 NOTE — Progress Notes (Signed)
[facility] progress note. Chief complaint. Chest pain. History of present illness. This 81 year old female with prior history bioprosthetic AVR, chronic A. fib, hypertension, diabetes, chronic kidney disease, etc. complains of central chest pain to nursing staff. 12-lead EKG was obtained which I reviewed. EKG does not look acutely different than previous EKGs and does not look acutely ischemic. Patient describes a sharp pain over the central sternum. There is no radiation, diaphoresis, nausea. Physical exam. Vital signs. Temperature 96.3, pulse 88, respiration 14, blood pressure 105/47. O2 sats 100%. General appearance. Frail elderly female who is alert and in no distress. Cardiac. Irregularly irregular. Lungs. Breath sounds clear. Abdomen. Soft with positive bowel sounds. Musculoskeletal. Pain is reproducible with palpation over the sternum. Impression/plan. Problem #1. Chest pain. Current data EKG looks unchanged from previous and does not look acutely ischemic. Nonetheless I will check troponin now and then every 6 hours for a total of 3 sets. We'll repeat a 12-lead EKG in 6 hours to evaluate for any changes. The fact that I can reproduce patient's complaint with palpation suggests this most likely musculoskeletal.

## 2015-01-09 NOTE — Progress Notes (Signed)
Attempted to place PICC, unable to advanced catheter centrally after many attempts to reposition and wire stiffining.  Blood return present and easily flushed.  Catheter trimmed to 10cm and left as midline as patient has limited access.  Merlyn Albert, RN updated.

## 2015-01-09 NOTE — Progress Notes (Signed)
Patient report relief of chest pain.

## 2015-01-09 NOTE — Progress Notes (Signed)
Patient: Melanie Camacho / Admit Date: 01/05/2015 / Date of Encounter: 01/09/2015, 12:27 PM   Subjective: Had CP overnight, improved with burping and worsened with palpation of the chest wall. It has resolved. No abdominal pain. Dyspnea is better but still present.  It's been a year since she really felt good. LEE, SOB all coming on since that time. She did feel better when she was discharged a week and a half ago then symptoms started coming back on.   Objective: Telemetry: atrial fib CVR. There was an episode titled asystole but verified with nursing this was just when leads fell off. Physical Exam: Blood pressure 116/72, pulse 101, temperature 96.5 F (35.8 C), temperature source Axillary, resp. rate 18, height 5\' 2"  (1.575 m), weight 253 lb 8.5 oz (115 kg), SpO2 97 %. General: Well developed, well nourished WF in no acute distress. Head: Normocephalic, atraumatic, sclera non-icteric, no xanthomas, nares are without discharge. Neck: JVP not elevated. Lungs: Diminished BS at right base, crackles at bases. No wheezes or rhonchi. She does speak in shorter sentences at times to take a breath in between them but does not appear acutely dyspneic. Heart: Irregularly irregular, rate controlled, systolic click noted over AV area, S1 S2 without murmurs, rubs, or gallops.  Abdomen: Soft, non-tender, non-distended with normoactive bowel sounds. No rebound/guarding. Extremities: No clubbing or cyanosis. 1+ BLE with stasis thickening of skin indicating chronic edema.  Neuro: Alert and oriented X 3. Moves all extremities spontaneously. Psych: Responds to questions appropriately with a normal affect. EKG - Atrial fib with low voltage, occasional PVCs, no acute ischemic changes   Intake/Output Summary (Last 24 hours) at 01/09/15 1227 Last data filed at 01/09/15 0500  Gross per 24 hour  Intake    840 ml  Output   1050 ml  Net   -210 ml    Inpatient Medications:  . apixaban  2.5 mg Oral BID  .  bisoprolol  10 mg Oral Daily  . colesevelam  1,875 mg Oral BID WC  . docusate sodium  100 mg Oral BID  . ferrous fumarate  1 tablet Oral Daily  . furosemide  40 mg Intravenous BID  . gabapentin  300 mg Oral BID  . levothyroxine  25 mcg Oral QAC breakfast  . multivitamin  2 tablet Oral Daily  . pantoprazole  40 mg Oral QHS  . sodium chloride  3 mL Intravenous Q12H   Infusions:    Labs:  Recent Labs  01/08/15 0419 01/09/15 0305  NA 141 142  K 3.7 3.3*  CL 105 105  CO2 26 28  GLUCOSE 80 87  BUN 41* 38*  CREATININE 1.36* 1.29*  CALCIUM 8.8* 8.7*    Recent Labs  01/08/15 0419 01/09/15 0305  WBC 4.4 3.7*  HGB 10.3* 10.3*  HCT 34.3* 34.2*  MCV 102.1* 103.0*  PLT 148* 146*    Recent Labs  01/09/15 0305 01/09/15 0910  TROPONINI 0.03 0.03   Radiology/Studies:  Ct Abdomen Pelvis Wo Contrast  01/06/2015   CLINICAL DATA:  Intermittent right lower quadrant pain for several days.  EXAM: CT ABDOMEN AND PELVIS WITHOUT CONTRAST  TECHNIQUE: Multidetector CT imaging of the abdomen and pelvis was performed following the standard protocol without IV contrast.  COMPARISON:  None.  FINDINGS: BODY WALL: Severe anasarca. The abdominal wall is incompletely imaged but there is no gross fluid collection. Lax abdominal wall with wide necked herniation below the umbilicus, containing nonobstructed small bowel. Muscular or fascia calcification in the abdominal  wall which could be dystrophic or from previous surgery.  LOWER CHEST: Moderate right and small left pleural effusions with multi segment atelectasis.  Aortic valve replacement. There is mild cardiomegaly and extensive coronary atherosclerosis.  ABDOMEN/PELVIS:  Liver: Large liver, with the right lobe projecting to the iliac crest. Rounded masses in the right liver are near water density, excluding a 2 cm lesion in segment 6 which was likely not visualized/evaluated on sonography from 8 days ago.  Biliary: Cholecystectomy. There is suggestion  of a choledochoenterostomy, although there is no pneumobilia.  Pancreas: Unremarkable.  Spleen: Unremarkable.  Adrenals: Unremarkable.  Kidneys and ureters: No hydronephrosis or stone.  Bladder: Decompressed by a Foley catheter.  Reproductive: Unremarkable for age.  Bowel: Distal gastrectomy. The anastomoses are patent and there is no surrounding inflammatory change. No pericecal inflammation. No bowel obstruction.  Retroperitoneum: No mass or adenopathy.  Peritoneum: Small perihepatic ascites, likely from volume overload.  Vascular: No acute abnormality.  Diffuse arterial calcification.  OSSEOUS: No acute abnormalities.  IMPRESSION: 1. No acute intra-abdominal findings. 2. Severe anasarca. Moderate right and small left pleural effusions. 3. A 2 cm lesion in segment 6 is indeterminate due to density. Other liver lesions appear cystic. 4. Ventral abdominal hernia containing nonobstructed small bowel. 5. Distal gastrectomy.   Electronically Signed   By: Marnee Spring M.D.   On: 01/06/2015 10:36   Dg Chest 2 View  01/05/2015   CLINICAL DATA:  Congestive heart failure shortness breath.  EXAM: CHEST  2 VIEW  COMPARISON:  Radiograph 12/27/2014  FINDINGS: Cardiac silhouette is enlarged. There is bilateral pleural effusions similar prior. There is mild central venous pulmonary congestion. Mild basilar atelectasis.  IMPRESSION: Bilateral pleural effusions and central venous pulmonary congestion. Findings similar to comparison exam.   Electronically Signed   By: Genevive Bi M.D.   On: 01/05/2015 17:11   Dg Chest 2 View  12/27/2014   CLINICAL DATA:  Chest pain for 1 day.  EXAM: CHEST  2 VIEW  COMPARISON:  12/20/2014.  FINDINGS: Stable enlarged cardiac silhouette. No significant change in bilateral pleural effusions. Post CABG changes are again demonstrated. Evidence of bilateral chronic rotator cuff tears. Left shoulder degenerative changes.  IMPRESSION: Stable cardiomegaly and bilateral pleural effusions.    Electronically Signed   By: Beckie Salts M.D.   On: 12/27/2014 15:47   Dg Chest 2 View  12/20/2014   CLINICAL DATA:  Congestive failure  EXAM: CHEST  2 VIEW  COMPARISON:  12/15/2014  FINDINGS: The cardiac shadow remains enlarged. A right-sided pleural effusion and right basilar atelectasis is again identified. Postsurgical changes are seen. The left lung shows a small effusion but stable. No acute bony abnormality is seen.  IMPRESSION: Bilateral effusions right greater than left. Right basilar atelectasis is noted as well.   Electronically Signed   By: Alcide Clever M.D.   On: 12/20/2014 12:16   Dg Chest 2 View  12/15/2014   CLINICAL DATA:  Shortness of breath, history of aortic valve replacement an acute and chronic CHF  EXAM: CHEST  2 VIEW  COMPARISON:  Portable chest x-ray of December 14, 2014  FINDINGS: The left lung is well-expanded. There is a small left pleural effusion. On the right there is a moderate-sized effusion with basilar atelectasis. The pulmonary interstitial markings remain mildly increased. The cardiac silhouette remains enlarged. There is tortuosity of the descending thoracic aorta.  IMPRESSION: COPD with superimposed CHF. Slight interval improvement in the appearance of the pulmonary interstitium has occurred since  yesterday's study. There is persistent pleural fluid and basilar atelectasis on the right.   Electronically Signed   By: David  [place]   On: 12/15/2014 08:07   US Renal  12/29/2014   CLINICAL DATA:  Acute renal failure  EXAM: RENAL/URINARY TRACT ULTRASOUND COMPLETE  COMPARISON:  None.  FINDINGS: Right Kidney:  Length: 9.1 cm. Diffuse cortical thinning is noted. No focal mass lesion or hydronephrosis is noted.  Left Kidney:  Length: 10.2 cm. Echogenicity within normal limits. No mass or hydronephrosis visualized.  Bladder:  Appears normal for degree of bladder distention.  A 3.2 cm hepatic cyst is noted. Small bilateral pleural effusions are noted.  IMPRESSION: Mild cortical  thinning in the right kidney.  No mass lesion or hydronephrosis is noted.  Small bilateral pleural effusions.  Hepatic cyst.   Electronically Signed   By: Alcide Clever M.D.   On: 12/29/2014 11:28   Dg Chest Port 1 View  01/07/2015   CLINICAL DATA:  Respiratory difficulty. Cough. Congestion. Cardiomegaly and CHF. No fever. No chest pain.  EXAM: PORTABLE CHEST - 1 VIEW  COMPARISON:  01/05/2015  FINDINGS: Heart is enlarged. There is moderate right pleural effusion associated right basilar atelectasis. Small left pleural effusion is present. There are changes of interstitial edema.  IMPRESSION: 1. Cardiomegaly and interstitial edema. 2. Persistent bibasilar opacity, right greater than left.   Electronically Signed   By: Norva Pavlov M.D.   On: 01/07/2015 18:02   Dg Chest Port 1 View  12/14/2014   CLINICAL DATA:  Shortness of Breath  EXAM: PORTABLE CHEST - 1 VIEW  COMPARISON:  None.  FINDINGS: There is cardiomegaly with pulmonary venous hypertension. There is a right pleural effusion with airspace consolidation in the right base. There is mild interstitial edema. No adenopathy. Bones are osteoporotic. Patient is status post median sternotomy.  IMPRESSION: Evidence of congestive heart failure. Cannot exclude superimposed pneumonia in the right base.   Electronically Signed   By: Bretta Bang III M.D.   On: 12/14/2014 14:37     Assessment and Plan  89F with bioprosthetic AVR in 2011, chronic afib on eliquis, HTN, DM, CKD3, hypoalbuminemia, cath 12/2013 with no obstructive CAD, EF 55-60% by echo 12/2014 presented with recurrence of LEE, abdominal swelling and abdominal pain. Discharged in April after diuresing over 22L.  1. Acute on chronic diastolic CHF with anasarca - echo 12/15/14 with LVEF 55-60%, normal functioning AVR, diastolic function not described in setting of afib - CXR with pulm edema, BNP  - wts have been unreliable with her form prior admission and this admit - abdominal pain could have  been due to hepatic congestion - Dr. Wyline Mood reviewed prior echo - "RV poorly visualized but appears to be enlarged with probable systolic dysfunction (no TAPSE, tricuspid tissue anular velocity reported 5 cm/s. Will repeat limited echo to get better pictures of RV, if significant dysfunction could explain problems with hypotension with diuresis." - limited echo pending - Lasix was held for 3 consecutive doses this admission due to soft BP - she received  last night,  this AM and is written to receive  IV BID starting this PM - last admission she was changed to torsemide to enhance absorption which may be an option - give KCl now. K 5.5 on admission (although possibly hemolyzed) so will exercise caution - ? CHF consult   2. Chronic atrial fibrillation, rate controlled - Bystolic held for 2 days then resumed today - will drop tomorrow's dose in  half in hopes that BP will not drop as low  3. AKI on CKD stage III 4. Chest discomfort with negative troponins, possibly due to CHF vs MSK, cath normal cors in 12/2013 5. H/o AVR tissue valve at [facility], 2D Echo 12/15/14 with normal appearance and mild AS/AI 6. HTN with h/o tendency towards softer BP during recent admissions 7. Hypothyroidism with abnormal thyroid indices last admission, managed by IM 8. Macrocytic anemia/abnormal CBC, per IM 9. Hypoalbuminemia 10. Hypokalemia, see above  Signed, Ronie Spies PA-C Pager: [phone]   I have seen and examined the patient along with Ronie Spies PA-C.  I have reviewed the chart, notes and new data.  I agree with PA's note.  Key new complaints: fatigue, improved dyspnea Key examination changes: consolidation right base Key new findings / data: right pleural effusion without clear pulmonary edema on CXR; BNP elevated, but lower than on prior admission  PLAN: Hemodynamic status is unclear. Need to make sure her pleural effusion is indeed due to CHF. Right heart failure could indeed explain  extensive fluid overload and hypotension, but echo does not clearly show RV dysfunction.I think she would benefit from right heart catheterization and CHF service consultation.  Thurmon Fair, MD, [facility] [phone] 01/09/2015, 1:27 PM

## 2015-01-09 NOTE — Progress Notes (Signed)
Advanced Heart Failure Rounding Note   Subjective:     [name] is a 81 y.o.female hx of morbid obesity, bioprosthetic AVR in 2011, chronic afib on eliquis, HTN, DM, CKD3, hypoalbuminemia, admitted with volume overload, abdominal pain and SOB. We are asked to see due to RV failure.   Admit April 6 -22nd with volume overload, diuresed 22 liters. But weight only changed a few pounds. On d/c weight apparently 230 pounds. Seen by renal due to a/c renal failure. D/c creatinine 1.54. UA did not have protein.   Discharged to rehab, however in just a few days she noticed recurrence of LE edema, abdominal swelling, and abdominal pain. Readmitted weight up to 253 pounds.   I have reviewed echo personally and it shows LVEF 55-60% severely dilated and HK RV. With septal flattening suggestive of RV pressure/volume overload. Unable to get good TR jet Has diuresed sluggishly. SBP has hovered in 90s and low 100s.   Denies h/o OSA, DVT/PE or connective tissue disease. Likes to eat a lot of ice.    12/2013 cath: no obstructive coronary disease 12/2014 echo: LVEF 55-60%, no WMAs, normal AVR. Diastolic function not described Current labs: K 3.7, Cr 1.36, BUN 31, GFR 36, Hgb 10.3, Plt 148, BNP down from 972, albumin 2.9 CXR bilateral effusions, pulm edema CT A/P: severe anasarca, moderate right and small left pleural effusion EKG afib, low voltage, poor R-wave progression   Objective:   Weight Range:  Vital Signs:   Temp:  [96 F (35.6 C)-97 F (36.1 C)] 96.1 F (35.6 C) (05/02 1243) Pulse Rate:  [51-108] 76 (05/02 1642) Resp:  [10-21] 20 (05/02 1642) BP: (87-144)/(39-83) 117/70 mmHg (05/02 1642) SpO2:  [97 %-100 %] 100 % (05/02 1642) Weight:  [115 kg (253 lb 8.5 oz)] 115 kg (253 lb 8.5 oz) (05/02 0500) Last BM Date: 01/07/15  Weight change: Filed Weights   01/07/15 0451 01/08/15 0600 01/09/15 0500  Weight: 108.863 kg (240 lb) 112 kg (246 lb 14.6 oz) 115 kg (253 lb 8.5 oz)     Intake/Output:   Intake/Output Summary (Last 24 hours) at 01/09/15 1807 Last data filed at 01/09/15 1200  Gross per 24 hour  Intake    480 ml  Output   1125 ml  Net   -645 ml     Physical Exam: General:  Elderly. Weak appearing. No resp difficulty HEENT: normal Neck: supple. Thick. JVP to jaw . Carotids 2+ bilat; no bruits. No lymphadenopathy or thryomegaly appreciated. Cor: PMI nondisplaced. Irregular rate & rhythm. + RV lift Lungs: basilar crackles  Abdomen: obese nontender, + distended.  Extremities: no cyanosis, clubbing, rash, 3+ edema into thighs Neuro: alert & orientedx3, cranial nerves grossly intact. moves all 4 extremities w/o difficulty. Affect pleasant  Telemetry:  AF Labs: Basic Metabolic Panel:  Recent Labs Lab 01/06/15 0502 01/07/15 0444 01/07/15 1837 01/08/15 0419 01/09/15 0305 01/09/15 0910  NA 141 141 140 141 142  --   K 4.4 5.5* 3.9 3.7 3.3*  --   CL 105 106 103 105 105  --   CO2 --   GLUCOSE 80 76 108* 80 87  --   BUN 43* 43* 39* 41* 38*  --   CREATININE 1.21* 1.37* 1.42* 1.36* 1.29*  --   CALCIUM 8.8 8.9 8.8 8.8* 8.7*  --   MG  --   --   --   --   --  2.2    Liver Function Tests:  Recent Labs Lab 01/05/15 1528  AST 30  ALT 14  ALKPHOS 74  BILITOT 0.7  PROT 7.0  ALBUMIN 2.9*   No results for input(s): LIPASE, AMYLASE in the last 168 hours. No results for input(s): AMMONIA in the last 168 hours.  CBC:  Recent Labs Lab 01/05/15 1528 01/08/15 0419 01/09/15 0305  WBC 3.7* 4.4 3.7*  HGB 10.5* 10.3* 10.3*  HCT 34.6* 34.3* 34.2*  MCV 104.2* 102.1* 103.0*  PLT 148* 148* 146*    Cardiac Enzymes:  Recent Labs Lab 01/05/15 1528 01/09/15 0305 01/09/15 0910 01/09/15 1503  TROPONINI 0.03 0.03 0.03 0.03    BNP: BNP (last 3 results)  Recent Labs  12/14/14 1410 12/27/14 0449 01/05/15 1528  BNP 1222.9* 1099.7* 972.1*    ProBNP (last 3 results) No results for input(s): PROBNP in the last 8760  hours.    Other results:  Imaging:  No results found.   Medications:     Scheduled Medications: . apixaban  2.5 mg Oral BID  . [START ON 01/10/2015] bisoprolol  5 mg Oral Daily  . colesevelam  1,875 mg Oral BID WC  . docusate sodium  100 mg Oral BID  . ferrous fumarate  1 tablet Oral Daily  . furosemide  40 mg Intravenous BID  . gabapentin  300 mg Oral BID  . levothyroxine  25 mcg Oral QAC breakfast  . multivitamin  2 tablet Oral Daily  . pantoprazole  40 mg Oral BID  . sodium chloride  3 mL Intravenous Q12H     Infusions:     PRN Medications:  sodium chloride, acetaminophen, gi cocktail, ondansetron (ZOFRAN) IV, oxyCODONE, phenazopyridine, rOPINIRole, sodium chloride   Assessment:   1. Acute on chronic diastolic HF R>>L with anasarca 2. Acute on chronic RV failure with acute cor pulmonale 3. Anasarca 4. AoV disease s/p bioprosthetic AVR 5. Morbid obesity 6. Chronic AF  Plan/Discussion:    I have reviewed her records and echo images personally. She has severe R>>L heart failure with evidence of PAH and cor pulmonale on echo. Now with marked volume overload and not responding to IV diuretics. Will place PICC line to follow CVPs and co-ox. If co-ox low will start milrinone. Continue diuresis. Will need RHC once diuresed. Etiology of PAH remains unclear. Will need VQ. Wrap legs.   Given low volts on ECG, amyloid is also a possibility but low urine protein makes this less likely.   Length of Stay: 4   Arvilla Meres MD 01/09/2015, 6:07 PM  Advanced Heart Failure Team Pager [phone] (M-F; 7a - 4p)  Please contact [facility] for night-coverage after hours (4p -7a ) and weekends on [email or web address]

## 2015-01-10 ENCOUNTER — Inpatient Hospital Stay (HOSPITAL_COMMUNITY): Payer: Medicare Other

## 2015-01-10 ENCOUNTER — Ambulatory Visit (HOSPITAL_COMMUNITY): Payer: Medicare Other

## 2015-01-10 DIAGNOSIS — K7290 Hepatic failure, unspecified without coma: Secondary | ICD-10-CM

## 2015-01-10 DIAGNOSIS — E109 Type 1 diabetes mellitus without complications: Secondary | ICD-10-CM

## 2015-01-10 LAB — BLOOD GAS, ARTERIAL
Acid-Base Excess: 3.4 mmol/L — ABNORMAL HIGH (ref 0.0–2.0)
Bicarbonate: 28.3 mEq/L — ABNORMAL HIGH (ref 20.0–24.0)
O2 Content: 2 L/min
O2 Saturation: 97.2 %
PH ART: 7.372 (ref 7.350–7.450)
Patient temperature: 98.6
TCO2: 29.8 mmol/L (ref 0–100)
pCO2 arterial: 50 mmHg — ABNORMAL HIGH (ref 35.0–45.0)
pO2, Arterial: 99.4 mmHg (ref 80.0–100.0)

## 2015-01-10 LAB — COMPREHENSIVE METABOLIC PANEL
ALBUMIN: 2.7 g/dL — AB (ref 3.5–5.0)
ALT: 14 U/L (ref 14–54)
AST: 31 U/L (ref 15–41)
Alkaline Phosphatase: 68 U/L (ref 38–126)
Anion gap: 10 (ref 5–15)
BILIRUBIN TOTAL: 0.8 mg/dL (ref 0.3–1.2)
BUN: 38 mg/dL — AB (ref 6–20)
CO2: 29 mmol/L (ref 22–32)
Calcium: 8.9 mg/dL (ref 8.9–10.3)
Chloride: 101 mmol/L (ref 101–111)
Creatinine, Ser: 1.34 mg/dL — ABNORMAL HIGH (ref 0.44–1.00)
GFR calc Af Amer: 42 mL/min — ABNORMAL LOW (ref >60)
GFR, EST NON AFRICAN AMERICAN: 36 mL/min — AB (ref >60)
Glucose, Bld: 76 mg/dL (ref 70–99)
Potassium: 4.2 mmol/L (ref 3.5–5.1)
SODIUM: 140 mmol/L (ref 135–145)
Total Protein: 6.4 g/dL — ABNORMAL LOW (ref 6.5–8.1)

## 2015-01-10 LAB — CARBOXYHEMOGLOBIN
Carboxyhemoglobin: 1.7 % — ABNORMAL HIGH (ref 0.5–1.5)
Carboxyhemoglobin: 1.7 % — ABNORMAL HIGH (ref 0.5–1.5)
METHEMOGLOBIN: 1.1 % (ref 0.0–1.5)
Methemoglobin: 1.2 % (ref 0.0–1.5)
O2 SAT: 96.2 %
O2 SAT: 74.5 %
Total hemoglobin: 10.4 g/dL — ABNORMAL LOW (ref 12.0–16.0)
Total hemoglobin: 11.1 g/dL — ABNORMAL LOW (ref 12.0–16.0)

## 2015-01-10 LAB — AMMONIA: Ammonia: 91 umol/L — ABNORMAL HIGH (ref 9–35)

## 2015-01-10 LAB — GLUCOSE, CAPILLARY
GLUCOSE-CAPILLARY: 77 mg/dL (ref 70–99)
GLUCOSE-CAPILLARY: 112 mg/dL — AB (ref 70–99)
GLUCOSE-CAPILLARY: 65 mg/dL — AB (ref 70–99)
GLUCOSE-CAPILLARY: 103 mg/dL — AB (ref 70–99)
Glucose-Capillary: 83 mg/dL (ref 70–99)
Glucose-Capillary: 79 mg/dL (ref 70–99)
Glucose-Capillary: 68 mg/dL — ABNORMAL LOW (ref 70–99)
Glucose-Capillary: 94 mg/dL (ref 70–99)
Glucose-Capillary: 78 mg/dL (ref 70–99)
Glucose-Capillary: 79 mg/dL (ref 70–99)

## 2015-01-10 MED ORDER — APIXABAN 5 MG PO TABS
5.0000 mg | ORAL_TABLET | Freq: Two times a day (BID) | ORAL | Status: DC
  Administered 2015-01-10 – 2015-01-27 (×34): 5 mg via ORAL
  Filled 2015-01-10 (×35): qty 1

## 2015-01-10 MED ORDER — LIDOCAINE HCL 1 % IJ SOLN
INTRAMUSCULAR | Status: AC
  Filled 2015-01-10: qty 20

## 2015-01-10 MED ORDER — LACTULOSE 10 GM/15ML PO SOLN
20.0000 g | Freq: Three times a day (TID) | ORAL | Status: DC
  Administered 2015-01-10 (×2): 20 g via ORAL
  Filled 2015-01-10 (×5): qty 30

## 2015-01-10 MED ORDER — BISOPROLOL FUMARATE 5 MG PO TABS
2.5000 mg | ORAL_TABLET | Freq: Every day | ORAL | Status: DC
  Administered 2015-01-11 – 2015-01-19 (×9): 2.5 mg via ORAL
  Filled 2015-01-10 (×11): qty 0.5

## 2015-01-10 MED ORDER — DEXTROSE 50 % IV SOLN
INTRAVENOUS | Status: AC
  Administered 2015-01-10: 50 mL
  Filled 2015-01-10: qty 50

## 2015-01-10 MED ORDER — FUROSEMIDE 10 MG/ML IJ SOLN
80.0000 mg | Freq: Two times a day (BID) | INTRAMUSCULAR | Status: DC
  Administered 2015-01-10 – 2015-01-11 (×2): 80 mg via INTRAVENOUS
  Filled 2015-01-10 (×3): qty 8

## 2015-01-10 MED ORDER — CETYLPYRIDINIUM CHLORIDE 0.05 % MT LIQD
7.0000 mL | Freq: Two times a day (BID) | OROMUCOSAL | Status: DC
  Administered 2015-01-10 – 2015-01-27 (×26): 7 mL via OROMUCOSAL

## 2015-01-10 NOTE — Progress Notes (Signed)
ABG results called to Lillia Abed, [place].

## 2015-01-10 NOTE — Procedures (Signed)
Successful RUE LD POWER PICC TIP SVC/RA NO COMP STABLE READY TO USE EBL 0

## 2015-01-10 NOTE — Progress Notes (Signed)
[facility] PROGRESS NOTE  Melanie Camacho XBJ:[id number] DOB: 1932/02/17 DOA: 01/05/2015 PCP: PROVIDER NOT IN SYSTEM Interim summary: 81 y.o lady with h/o hypertension, diet controlled DM, CAD s/o AVR, stage 3 ckd, afib on eliquis, comes in for abdominal pain and sob. She was evaluated with a CT abdomen and pelvis , which did not reveal any acute intra abdominal findings. There was severe anasarca with moderate right and small left pleural effusions. She was admitted for acute on chronic diastolic heart failure and started on IV lasix. From 4/30 and 5/1, patient was hypotensive, tachycardic and her lasix and BB was held. Her bp parameters this am were also borderline and we had to decrease the lasix to 40 mg IV BID. Cardiology and heart failure team consulted for recommendations.  Assessment/Plan: 1. Acute on chronic diastolic CHF exacerbation:  - she was initially started on IV lasix 80 mg BID, HELD THE lasix and BB on 4/30 and 5/1.  Her BP parameters still borderline and i decreased her lasix to 40 mg BID, so that bp doesn't bottom down like the last two days.  Her net fluid since admission was neg 1655. - cardiology consulted for recommendations.  - echocardiogram ordered pending,   2. Atrial fibrillation; Rate controlled . On eliquis   3. Hypothyroidism: Resume synthroid.   4. Restless leg syndrome: Resume ropinirole.   5. Mild macrocytic anemia; Stable continue to monitor.   6. Lethargy: unclear etiology, resolved. CT head ordered. Monitor. She is oriented and answers all question appropriately. If her lethargy doesn't improve  Might do an ABG to see if she iis hypercarbic.   7. Hypotension:  Improved. unclear etiology, she is afebrile , no leukocytosis, UA is negative for infection. CXR ordered. No source of infection found.    8. Stage 3 CKD; appears to be at baseline.   9. Hyperkalemia; kayexalate ordered and repeat BMP showed resolution of the hyperkalemia.  10. Chest  discomfort on 5/1 night, associated with lot o belching on laying down and heartburn: She is already on protonix, plan to increase the dose to BID and added GI cocktail to her regimen. EKG does not show any ischemic changes and troponins have been negative.   11. Hypokalemia: cautious replacement, as she was hyperkalemic earlier this admission. Follow up in am.   Code Status: full code.  Family Communication: none at bedside Disposition Plan: pending PT evaluation.    Consultants:  none  Procedures:  none  Antibiotics:  none  HPI/Subjective: She is sleepy this am, but denies any chest pain or discomfort, appears swollen every where.   Objective: Filed Vitals:   01/10/15 1100  BP: 93/54  Pulse: 95  Temp: 97.6 F (36.4 C)  Resp: 22    Intake/Output Summary (Last 24 hours) at 01/10/15 1412 Last data filed at 01/10/15 1143  Gross per 24 hour  Intake    240 ml  Output   1250 ml  Net  -1010 ml   Filed Weights   01/08/15 0600 01/09/15 0500 01/10/15 0500  Weight: 112 kg (246 lb 14.6 oz) 115 kg (253 lb 8.5 oz) 115.5 kg (254 lb 10.1 oz)    Exam:   General: sleepy in the bed. Arouses to verbal cues and answering questions appropriately.   Cardiovascular: s1s2, .   Respiratory: diminished air entry at bases. No wheezing or rhonchi  Abdomen: soft non tender non distended bowel sounds heard  Musculoskeletal: 3+ pedal edema.  Data Reviewed: Basic Metabolic Panel:  Recent  Labs Lab 01/07/15 0444 01/07/15 1837 01/08/15 0419 01/09/15 0305 01/09/15 0910 01/10/15 0308  NA 141 140 141 142  --  140  K 5.5* 3.9 3.7 3.3*  --  4.2  CL 106 103 105 105  --  101  CO2 --  29  GLUCOSE 76 108* 80 87  --  76  BUN 43* 39* 41* 38*  --  38*  CREATININE 1.37* 1.42* 1.36* 1.29*  --  1.34*  CALCIUM 8.9 8.8 8.8* 8.7*  --  8.9  MG  --   --   --   --  2.2  --    Liver Function Tests:  Recent Labs Lab 01/05/15 1528 01/10/15 0308  AST 30 31  ALT 14 14   ALKPHOS 74 68  BILITOT 0.7 0.8  PROT 7.0 6.4*  ALBUMIN 2.9* 2.7*   No results for input(s): LIPASE, AMYLASE in the last 168 hours. No results for input(s): AMMONIA in the last 168 hours. CBC:  Recent Labs Lab 01/05/15 1528 01/08/15 0419 01/09/15 0305  WBC 3.7* 4.4 3.7*  HGB 10.5* 10.3* 10.3*  HCT 34.6* 34.3* 34.2*  MCV 104.2* 102.1* 103.0*  PLT 148* 148* 146*   Cardiac Enzymes:  Recent Labs Lab 01/05/15 1528 01/09/15 0305 01/09/15 0910 01/09/15 1503  TROPONINI 0.03 0.03 0.03 0.03   BNP (last 3 results)  Recent Labs  12/14/14 1410 12/27/14 0449 01/05/15 1528  BNP 1222.9* 1099.7* 972.1*    ProBNP (last 3 results) No results for input(s): PROBNP in the last 8760 hours.  CBG:  Recent Labs Lab 01/10/15 0627 01/10/15 0656 01/10/15 0751 01/10/15 1020 01/10/15 1330  GLUCAP 68* 94 77 78 79    Recent Results (from the past 240 hour(s))  Culture, Urine     Status: None   Collection Time: 01/07/15 12:30 AM  Result Value Ref Range Status   Specimen Description URINE, CATHETERIZED  Final   Special Requests n/a Immunocompromised  Final   Colony Count NO GROWTH Performed at [facility]   Final   Culture NO GROWTH Performed at [facility]   Final   Report Status 01/08/2015 FINAL  Final     Studies: Ct Head Wo Contrast  01/10/2015   CLINICAL DATA:  Drowsiness  EXAM: CT HEAD WITHOUT CONTRAST  TECHNIQUE: Contiguous axial images were obtained from the base of the skull through the vertex without intravenous contrast.  COMPARISON:  None.  FINDINGS: Bony calvarium is intact. Mild atrophic changes are seen. No findings to suggest acute hemorrhage, acute infarction or space-occupying mass lesion are identified.  IMPRESSION: Mild atrophic changes without acute abnormality.   Electronically Signed   By: Alcide Clever M.D.   On: 01/10/2015 12:35   Ir Fluoro Guide Cv Line Right  01/10/2015   CLINICAL DATA:  Multiple comorbidities including atrial  fibrillation, hypertension, CHF, morbid obesity, poor peripheral access  EXAM: POWER PICC LINE PLACEMENT WITH ULTRASOUND AND FLUOROSCOPIC GUIDANCE  FLUOROSCOPY TIME:  30 seconds, 9 mGy  PROCEDURE: The patient was advised of the possible risks andcomplications and agreed to undergo the procedure. The patient was then brought to the angiographic suite for the procedure.  The right arm was prepped with chlorhexidine, drapedin the usual sterile fashion using maximum barrier technique (cap and mask, sterile gown, sterile gloves, large sterile sheet, hand hygiene and cutaneous antisepsis) and infiltrated locally with 1% Lidocaine.  Ultrasound demonstrated patency of the right brachial vein, and this was documented with an image.  Under real-time ultrasound guidance, this vein was accessed with a 21 gauge micropuncture needle and image documentation was performed. A 0.018 wire was introduced in to the vein. Over this, a 5 [place] double lumen power PICC was advanced to the lower SVC/right atrial junction. Fluoroscopy during the procedure and fluoro spot radiograph confirms appropriate catheter position. The catheter was flushed and covered with asterile dressing.  Catheter length: 38  Complications: None  IMPRESSION: Successful right arm power PICC line placement with ultrasound and fluoroscopic guidance. The catheter is ready for use.   Electronically Signed   By: Judie Petit.  Shick M.D.   On: 01/10/2015 12:56   Ir US Guide Vasc Access Right  01/10/2015   CLINICAL DATA:  Multiple comorbidities including atrial fibrillation, hypertension, CHF, morbid obesity, poor peripheral access  EXAM: POWER PICC LINE PLACEMENT WITH ULTRASOUND AND FLUOROSCOPIC GUIDANCE  FLUOROSCOPY TIME:  30 seconds, 9 mGy  PROCEDURE: The patient was advised of the possible risks andcomplications and agreed to undergo the procedure. The patient was then brought to the angiographic suite for the procedure.  The right arm was prepped with chlorhexidine, drapedin  the usual sterile fashion using maximum barrier technique (cap and mask, sterile gown, sterile gloves, large sterile sheet, hand hygiene and cutaneous antisepsis) and infiltrated locally with 1% Lidocaine.  Ultrasound demonstrated patency of the right brachial vein, and this was documented with an image. Under real-time ultrasound guidance, this vein was accessed with a 21 gauge micropuncture needle and image documentation was performed. A 0.018 wire was introduced in to the vein. Over this, a 5 [place] double lumen power PICC was advanced to the lower SVC/right atrial junction. Fluoroscopy during the procedure and fluoro spot radiograph confirms appropriate catheter position. The catheter was flushed and covered with asterile dressing.  Catheter length: 38  Complications: None  IMPRESSION: Successful right arm power PICC line placement with ultrasound and fluoroscopic guidance. The catheter is ready for use.   Electronically Signed   By: Judie Petit.  Shick M.D.   On: 01/10/2015 12:56    Scheduled Meds: . antiseptic oral rinse  7 mL Mouth Rinse BID  . apixaban  2.5 mg Oral BID  . bisoprolol  5 mg Oral Daily  . colesevelam  1,875 mg Oral BID WC  . docusate sodium  100 mg Oral BID  . ferrous fumarate  1 tablet Oral Daily  . furosemide  40 mg Intravenous BID  . gabapentin  300 mg Oral BID  . levothyroxine  25 mcg Oral QAC breakfast  . lidocaine      . multivitamin  2 tablet Oral Daily  . pantoprazole  40 mg Oral BID  . sodium chloride  3 mL Intravenous Q12H   Continuous Infusions:   Principal Problem:   Acute on chronic diastolic CHF (congestive heart failure), NYHA class 1 Active Problems:   Chronic a-fib   S/P AVR-(tissue) 2011 at [facility] - no CABG with this   HTN- (transient hypotension during recent admission)   Hypothyroid   DM (diabetes mellitus)   CKD (chronic kidney disease), stage III   Abdominal pain   CHF exacerbation   RVF (right ventricular failure)   Anasarca    Time spent: 35  min    Gared Gillie  [facility] Pager [phone]. If 7PM-7AM, please contact night-coverage at [email or web address], password [facility] 01/10/2015, 2:12 PM  LOS: 5 days

## 2015-01-10 NOTE — Progress Notes (Signed)
BS 64; gave Dextrose 50% IV  BS rechecked 15 minutes later and it was 112 Lorin Picket, Charity fundraiser

## 2015-01-10 NOTE — Progress Notes (Signed)
CRNA called by pt daughter to stick ABG.  CRNA at bedside at this time.

## 2015-01-10 NOTE — Progress Notes (Signed)
Hypoglycemic Event  CBG: 68  Treatment: 25 ml of D50   Symptoms: Difficulty waking, drowsy  Follow-up CBG: Time:  0653 CBG Result:  94  Possible Reasons for Event:  unknown  Comments/MD notified:      Alba Destine  Remember to initiate Hypoglycemia Order Set & complete

## 2015-01-10 NOTE — Progress Notes (Addendum)
Advanced Heart Failure Rounding Note   Subjective:     Weight continues to go up. Poor diuresis. PICC placed today. CVP line not set up yet.   Now very somnolent. Arouses then falls right back to sleep.    Objective:   Weight Range:  Vital Signs:   Temp:  [94.8 F (34.9 C)-97.6 F (36.4 C)] 97.6 F (36.4 C) (05/03 0400) Pulse Rate:  [54-101] 84 (05/03 0400) Resp:  [0-21] 19 (05/03 0400) BP: (93-117)/(23-83) 107/68 mmHg (05/03 0400) SpO2:  [91 %-100 %] 100 % (05/03 0400) Weight:  [254 lb 10.1 oz (115.5 kg)] 254 lb 10.1 oz (115.5 kg) (05/03 0500) Last BM Date: 01/07/15  Weight change: Filed Weights   01/08/15 0600 01/09/15 0500 01/10/15 0500  Weight: 246 lb 14.6 oz (112 kg) 253 lb 8.5 oz (115 kg) 254 lb 10.1 oz (115.5 kg)    Intake/Output:   Intake/Output Summary (Last 24 hours) at 01/10/15 0820 Last data filed at 01/09/15 2300  Gross per 24 hour  Intake      0 ml  Output   1100 ml  Net  -1100 ml     Physical Exam: General:  Elderly. Weak appearing. Very somnolent HEENT: normal Neck: supple. Thick. JVP to jaw . Carotids 2+ bilat; no bruits. No lymphadenopathy or thryomegaly appreciated. Cor: PMI nondisplaced. Irregular rate & rhythm. + RV lift Lungs: basilar crackles  Abdomen: obese nontender, + distended.  Extremities: no cyanosis, clubbing, rash, 3+ edema into thighs Neuro: alert & orientedx3, cranial nerves grossly intact. moves all 4 extremities w/o difficulty. Affect pleasant  Telemetry:  AF Labs: Basic Metabolic Panel:  Recent Labs Lab 01/07/15 0444 01/07/15 1837 01/08/15 0419 01/09/15 0305 01/09/15 0910 01/10/15 0308  NA 141 140 141 142  --  140  K 5.5* 3.9 3.7 3.3*  --  4.2  CL 106 103 105 105  --  101  CO2 --  29  GLUCOSE 76 108* 80 87  --  76  BUN 43* 39* 41* 38*  --  38*  CREATININE 1.37* 1.42* 1.36* 1.29*  --  1.34*  CALCIUM 8.9 8.8 8.8* 8.7*  --  8.9  MG  --   --   --   --  2.2  --     Liver Function Tests:  Recent  Labs Lab 01/05/15 1528 01/10/15 0308  AST 30 31  ALT 14 14  ALKPHOS 74 68  BILITOT 0.7 0.8  PROT 7.0 6.4*  ALBUMIN 2.9* 2.7*   No results for input(s): LIPASE, AMYLASE in the last 168 hours. No results for input(s): AMMONIA in the last 168 hours.  CBC:  Recent Labs Lab 01/05/15 1528 01/08/15 0419 01/09/15 0305  WBC 3.7* 4.4 3.7*  HGB 10.5* 10.3* 10.3*  HCT 34.6* 34.3* 34.2*  MCV 104.2* 102.1* 103.0*  PLT 148* 148* 146*    Cardiac Enzymes:  Recent Labs Lab 01/05/15 1528 01/09/15 0305 01/09/15 0910 01/09/15 1503  TROPONINI 0.03 0.03 0.03 0.03    BNP: BNP (last 3 results)  Recent Labs  12/14/14 1410 12/27/14 0449 01/05/15 1528  BNP 1222.9* 1099.7* 972.1*    ProBNP (last 3 results) No results for input(s): PROBNP in the last 8760 hours.    Other results:  Imaging: No results found.   Medications:     Scheduled Medications: . antiseptic oral rinse  7 mL Mouth Rinse BID  . apixaban  2.5 mg Oral BID  . bisoprolol  5 mg Oral Daily  .  colesevelam  1,875 mg Oral BID WC  . docusate sodium  100 mg Oral BID  . ferrous fumarate  1 tablet Oral Daily  . furosemide  40 mg Intravenous BID  . gabapentin  300 mg Oral BID  . levothyroxine  25 mcg Oral QAC breakfast  . multivitamin  2 tablet Oral Daily  . pantoprazole  40 mg Oral BID  . sodium chloride  3 mL Intravenous Q12H    Infusions:    PRN Medications: sodium chloride, acetaminophen, gi cocktail, ondansetron (ZOFRAN) IV, oxyCODONE, phenazopyridine, rOPINIRole, sodium chloride   Assessment:   1. Acute on chronic diastolic HF R>>L with anasarca 2. Acute on chronic RV failure with acute cor pulmonale 3. Anasarca 4. AoV disease s/p bioprosthetic AVR 5. Morbid obesity 6. Chronic AF 7. Hypoalbuminemia- Albumin 2.7 8. Somnolence  Plan/Discussion:    She has severe RHF with massive volume overload. Now nearly obtunded. Suspect CO2 retention (vs hyperammonemia).  Will get stat ABG and NH3  level.   Await CVP and co-ox reading. Will likely need milrinone. Increase lasix to 80IV bid. With creatinine < 1.5. Will increase apixabane to 5 bid.   Once diuresed will need RHC and VQ as well as outpatient sleep study.   Arturo Freundlich,MD 3:13 PM

## 2015-01-10 NOTE — Progress Notes (Signed)
Pt is currently off BIPAP. She is wearing 2L [place]. Vitals: HR: 82, RR: 19, Sats: 99%. Pt is resting comfortably at this time. RT will monitor.

## 2015-01-10 NOTE — Progress Notes (Signed)
Physical Therapy Treatment Patient Details Name: Melanie Camacho MRN: [id number] DOB: 1932/07/22 Today's Date: 01/10/2015    History of Present Illness Pt is an 81 y./o female recently admitted to similar s/s of CHF, now back from SNF rehab with CHF.    PT Comments    Pt admitted with above diagnosis. Pt currently with functional limitations due to balance and endurance deficits. Pt was able to stand with RW today but very lethargic therefore limited treatment.  Will continue as pt tolerates.  Pt will benefit from skilled PT to increase their independence and safety with mobility to allow discharge to the venue listed below.    Follow Up Recommendations  SNF     Equipment Recommendations  None recommended by PT    Recommendations for Other Services       Precautions / Restrictions Precautions Precautions: Fall Precaution Comments: very fearful of falling Restrictions Weight Bearing Restrictions: No    Mobility  Bed Mobility Overal bed mobility: Needs Assistance;+2 for physical assistance Bed Mobility: Rolling;Sidelying to Sit Rolling: Mod assist;+2 for physical assistance Sidelying to sit: +2 for physical assistance;Max assist       General bed mobility comments: needed truncal and LE assist with use of bed pad to assist.  Pt with full body edema causing bed mobility to be difficult.    Transfers Overall transfer level: Needs assistance Equipment used: Rolling walker (2 wheeled) Transfers: Sit to/from [employer] Sit to Stand: Mod assist;Max assist;+2 physical assistance;From elevated surface Stand pivot transfers: Mod assist;+2 physical assistance       General transfer comment: Assist for anterior lean and to power up.  did better with use of counting with use of momentum.  Took incr time for pt to reach full stand.  Once up, pt needed max cues to move LEs and had a hard time moving them probably only taking 1 1/2 steps around with PT and tech moving bed  to bring chair up to pt as she just couldn't move feet as necessary.    Ambulation/Gait             General Gait Details: unable at this time.  Pivot only    Stairs            Wheelchair Mobility    Modified Rankin (Stroke Patients Only)       Balance Overall balance assessment: Needs assistance Sitting-balance support: Bilateral upper extremity supported;Feet supported Sitting balance-Leahy Scale: Poor Sitting balance - Comments: Needed assist to sit EOB as she was leaning all directions. Postural control: Posterior lean;Right lateral lean;Left lateral lean Standing balance support: Bilateral upper extremity supported;During functional activity Standing balance-Leahy Scale: Poor Standing balance comment: Pt requires UE support for balance in standing.  Poor postural control and stability even with RW.                     Cognition Arousal/Alertness: Lethargic Behavior During Therapy: Flat affect Overall Cognitive Status: Within Functional Limits for tasks assessed                      Exercises      General Comments General comments (skin integrity, edema, etc.): Pt with significant edema full body making it difficult to move.  Also lethargic.       Pertinent Vitals/Pain Pain Assessment: Faces Faces Pain Scale: Hurts little more Pain Location: all over Pain Descriptors / Indicators: Aching;Grimacing Pain Intervention(s): Limited activity within patient's tolerance;Monitored during session;Repositioned  80-102 bpm, 98% on 3LO2, BP 96/46 initially and 88/32 on departure.  Nurse aware.     Home Living                      Prior Function            PT Goals (current goals can now be found in the care plan section) Progress towards PT goals: Not progressing toward goals - comment (very lethargic today)    Frequency  Min 2X/week    PT Plan Current plan remains appropriate    Co-evaluation             End of Session  Equipment Utilized During Treatment: Gait belt;Oxygen Activity Tolerance: Patient limited by fatigue;Patient limited by lethargy Patient left: in chair;with call bell/phone within reach     Time: 0131-4388 PT Time Calculation (min) (ACUTE ONLY): 24 min  Charges:  $Therapeutic Activity: 23-37 mins                    G CodesBerline Lopes Jan 25, 2015, 10:48 AM [facility] [phone] (pager)

## 2015-01-10 NOTE — Progress Notes (Signed)
Spoke with primary RN about midline needing to be exchanged to a PICC by interventional radiology.    She made MD aware and they are to enter the order for Interventional radiology to insert PICC or exchange it.

## 2015-01-11 ENCOUNTER — Inpatient Hospital Stay (HOSPITAL_COMMUNITY): Payer: Medicare Other

## 2015-01-11 DIAGNOSIS — I509 Heart failure, unspecified: Secondary | ICD-10-CM

## 2015-01-11 LAB — COMPREHENSIVE METABOLIC PANEL
ALK PHOS: 68 U/L (ref 38–126)
ALT: 15 U/L (ref 14–54)
ANION GAP: 10 (ref 5–15)
AST: 26 U/L (ref 15–41)
Albumin: 2.6 g/dL — ABNORMAL LOW (ref 3.5–5.0)
BILIRUBIN TOTAL: 0.8 mg/dL (ref 0.3–1.2)
BUN: 39 mg/dL — ABNORMAL HIGH (ref 6–20)
CO2: 30 mmol/L (ref 22–32)
Calcium: 8.8 mg/dL — ABNORMAL LOW (ref 8.9–10.3)
Chloride: 101 mmol/L (ref 101–111)
Creatinine, Ser: 1.33 mg/dL — ABNORMAL HIGH (ref 0.44–1.00)
GFR calc Af Amer: 43 mL/min — ABNORMAL LOW (ref >60)
GFR, EST NON AFRICAN AMERICAN: 37 mL/min — AB (ref >60)
GLUCOSE: 77 mg/dL (ref 70–99)
Potassium: 3.7 mmol/L (ref 3.5–5.1)
Sodium: 141 mmol/L (ref 135–145)
Total Protein: 6.6 g/dL (ref 6.5–8.1)

## 2015-01-11 LAB — CARBOXYHEMOGLOBIN
Carboxyhemoglobin: 1.8 % — ABNORMAL HIGH (ref 0.5–1.5)
Carboxyhemoglobin: 1.4 % (ref 0.5–1.5)
METHEMOGLOBIN: 0.9 % (ref 0.0–1.5)
Methemoglobin: 0.7 % (ref 0.0–1.5)
O2 SAT: 99.1 %
O2 SAT: 85.7 %
TOTAL HEMOGLOBIN: 10.4 g/dL — AB (ref 12.0–16.0)
TOTAL HEMOGLOBIN: 11.7 g/dL — AB (ref 12.0–16.0)

## 2015-01-11 LAB — TSH: TSH: 15.717 u[IU]/mL — AB (ref 0.350–4.500)

## 2015-01-11 LAB — GLUCOSE, CAPILLARY
GLUCOSE-CAPILLARY: 108 mg/dL — AB (ref 70–99)
Glucose-Capillary: 72 mg/dL (ref 70–99)
Glucose-Capillary: 93 mg/dL (ref 70–99)
Glucose-Capillary: 122 mg/dL — ABNORMAL HIGH (ref 70–99)

## 2015-01-11 MED ORDER — LACTULOSE 10 GM/15ML PO SOLN
40.0000 g | Freq: Three times a day (TID) | ORAL | Status: DC
  Administered 2015-01-11 – 2015-01-16 (×14): 40 g via ORAL
  Filled 2015-01-11 (×19): qty 60

## 2015-01-11 MED ORDER — FUROSEMIDE 10 MG/ML IJ SOLN
80.0000 mg | Freq: Three times a day (TID) | INTRAMUSCULAR | Status: DC
  Administered 2015-01-11 – 2015-01-21 (×29): 80 mg via INTRAVENOUS
  Filled 2015-01-11 (×35): qty 8

## 2015-01-11 MED ORDER — POTASSIUM CHLORIDE CRYS ER 20 MEQ PO TBCR
40.0000 meq | EXTENDED_RELEASE_TABLET | Freq: Two times a day (BID) | ORAL | Status: DC
  Administered 2015-01-11 – 2015-01-24 (×27): 40 meq via ORAL
  Filled 2015-01-11 (×31): qty 2

## 2015-01-11 MED ORDER — METOLAZONE 5 MG PO TABS
5.0000 mg | ORAL_TABLET | Freq: Two times a day (BID) | ORAL | Status: DC
  Administered 2015-01-11 – 2015-01-22 (×22): 5 mg via ORAL
  Filled 2015-01-11 (×30): qty 1

## 2015-01-11 NOTE — Progress Notes (Signed)
LCSW completed progress with medical team this morning. Patient at DC will go home with family. Patient was in a facility prior to admission, however family took patient out of facility.  No needs at this time.  LCSW signing off.  If needs arise, please re-consult or call.  Deretha Emory, MSW Clinical Social Work: Emergency Room [phone]

## 2015-01-11 NOTE — Progress Notes (Signed)
[facility]                                                                              Patient Demographics  Melanie Camacho, is a 81 y.o. female, DOB - 1931/09/26, ZOX:[id number]  Admit date - 01/05/2015   Admitting Physician Lorretta Harp, MD  Outpatient Primary MD for the patient is PROVIDER NOT IN SYSTEM  LOS - 6   Chief Complaint  Patient presents with  . Shortness of Breath       Brief HPI   Patient is a 81 year old female with hypertension, diabetes, diet controlled, DM, CAD, AVR, stage 3 CKD, afib on eliquis, presented on 4/28 for abdominal pain and shortness of breath.  She was evaluated with a CT abdomen and pelvis , which did not reveal any acute intra abdominal findings. There was severe anasarca with moderate right and small left pleural effusions. Patient was admitted for acute on chronic diastolic heart failure and started on IV lasix. From 4/30 and 5/1, patient was hypotensive, tachycardic and her lasix and BB was held. Cardiology and heart failure team were consulted for recommendations.  Patient was noted to be lethargic on 5/3, CT head was negative for acute intracranial pathology, ABG did not show acute hypercapnia, PCO2 of 50, ammonia level was elevated at 91 and started on lactulose   Assessment & Plan    Principal Problem:  Acute on chronic diastolic CHF exacerbation with anasarca, acute on chronic RV failure/cor pulmonale: - Patient still has significant anasarca, heart failure team following, increased her Lasix and added metolazone today, may need milrinone. - Negative balance of 2.5 L, weight down from 254-> 247lbs today  Atrial fibrillation; Rate controlled, continue eliquis  Acute encephalopathy - Unclear etiology, CT head is negative - Ammonia level elevated, was started on lactulose, patient is oriented 2 today, falls back to sleep during conversation  Hypothyroidism: Continue Synthroid, TSH was 7.7 on 4/8, free T4 and T3 were low,  will recheck  Restless leg syndrome: Continue ropinirole.   Mild macrocytic anemia; Stable continue to monitor.   Hypotension: Improved. unclear etiology, afebrile, no leukocytosis - UA is negative for infection. CXR on 4/30 did not show any consolidation.  Stage 3 CKD;  - aapears at baseline, BMET pending  Code Status: full code  Family Communication: No family member at bed side, Discussed in detail with the patient, all imaging results, lab results explained to the patient   Disposition Plan: remains in stepdown  Time Spent in minutes  25 minutes  Procedures  None  Consults    cardiology  DVT Prophylaxis  eliquis  Medications  Scheduled Meds: . antiseptic oral rinse  7 mL Mouth Rinse BID  . apixaban  5 mg Oral BID  . bisoprolol  2.5 mg Oral Daily  . colesevelam  1,875 mg Oral BID WC  . docusate sodium  100 mg Oral BID  . ferrous fumarate  1 tablet Oral Daily  . furosemide  80 mg Intravenous 3 times per day  . gabapentin  300 mg Oral BID  . lactulose  40 g Oral TID  . levothyroxine  25 mcg Oral  QAC breakfast  . metolazone  5 mg Oral BID  . multivitamin  2 tablet Oral Daily  . pantoprazole  40 mg Oral BID  . potassium chloride  40 mEq Oral BID  . sodium chloride  3 mL Intravenous Q12H   Continuous Infusions:  PRN Meds:.sodium chloride, acetaminophen, gi cocktail, ondansetron (ZOFRAN) IV, oxyCODONE, phenazopyridine, rOPINIRole, sodium chloride   Antibiotics   Anti-infectives    None        Subjective:   Melanie Camacho was seen and examined today.  quite somnolent, afebrile does not appear to be in any pain. opens eyes during examination, oriented 2 (to self and place). No acute events overnight, denies any chest pain, nausea, vomiting, abdominal pain.  Objective:   Blood pressure 98/81, pulse 85, temperature 97.4 F (36.3 C), temperature source Oral, resp. rate 15, height 5\' 2"  (1.575 m), weight 112.3 kg (247 lb 9.2 oz), SpO2 98 %.  Wt Readings  from Last 3 Encounters:  01/11/15 112.3 kg (247 lb 9.2 oz)  12/30/14 104.599 kg (230 lb 9.6 oz)  12/29/13 87.7 kg (193 lb 5.5 oz)     Intake/Output Summary (Last 24 hours) at 01/11/15 1037 Last data filed at 01/11/15 0820  Gross per 24 hour  Intake      0 ml  Output    925 ml  Net   -925 ml    Exam  Gen: Somnolent but opens eyes to verbal commands, oriented 2   HEENT:  PERRLA, EOMI, Anicteic Sclera, mucous membranes moist.   Neck: Supple,JVD +, no masses  CVS:  irregularly irregular  Resp:   Bibasilar crackles  Abdomen:  anasarca, Soft, nontender,distended, + bowel sounds  Ext: no cyanosis clubbing, 3+ edema  Neuro: did not cooperate with neuro exam   Skin: No rashes  Psych:   Somnolent    Data Review   Micro Results Recent Results (from the past 240 hour(s))  Culture, Urine     Status: None   Collection Time: 01/07/15 12:30 AM  Result Value Ref Range Status   Specimen Description URINE, CATHETERIZED  Final   Special Requests n/a Immunocompromised  Final   Colony Count NO GROWTH Performed at [facility]   Final   Culture NO GROWTH Performed at [facility]   Final   Report Status 01/08/2015 FINAL  Final    Radiology Reports Ct Abdomen Pelvis Wo Contrast  01/06/2015   CLINICAL DATA:  Intermittent right lower quadrant pain for several days.  EXAM: CT ABDOMEN AND PELVIS WITHOUT CONTRAST  TECHNIQUE: Multidetector CT imaging of the abdomen and pelvis was performed following the standard protocol without IV contrast.  COMPARISON:  None.  FINDINGS: BODY WALL: Severe anasarca. The abdominal wall is incompletely imaged but there is no gross fluid collection. Lax abdominal wall with wide necked herniation below the umbilicus, containing nonobstructed small bowel. Muscular or fascia calcification in the abdominal wall which could be dystrophic or from previous surgery.  LOWER CHEST: Moderate right and small left pleural effusions with multi segment  atelectasis.  Aortic valve replacement. There is mild cardiomegaly and extensive coronary atherosclerosis.  ABDOMEN/PELVIS:  Liver: Large liver, with the right lobe projecting to the iliac crest. Rounded masses in the right liver are near water density, excluding a 2 cm lesion in segment 6 which was likely not visualized/evaluated on sonography from 8 days ago.  Biliary: Cholecystectomy. There is suggestion of a choledochoenterostomy, although there is no pneumobilia.  Pancreas: Unremarkable.  Spleen: Unremarkable.  Adrenals: Unremarkable.  Kidneys and ureters: No hydronephrosis or stone.  Bladder: Decompressed by a Foley catheter.  Reproductive: Unremarkable for age.  Bowel: Distal gastrectomy. The anastomoses are patent and there is no surrounding inflammatory change. No pericecal inflammation. No bowel obstruction.  Retroperitoneum: No mass or adenopathy.  Peritoneum: Small perihepatic ascites, likely from volume overload.  Vascular: No acute abnormality.  Diffuse arterial calcification.  OSSEOUS: No acute abnormalities.  IMPRESSION: 1. No acute intra-abdominal findings. 2. Severe anasarca. Moderate right and small left pleural effusions. 3. A 2 cm lesion in segment 6 is indeterminate due to density. Other liver lesions appear cystic. 4. Ventral abdominal hernia containing nonobstructed small bowel. 5. Distal gastrectomy.   Electronically Signed   By: Marnee Spring M.D.   On: 01/06/2015 10:36   Dg Chest 2 View  01/05/2015   CLINICAL DATA:  Congestive heart failure shortness breath.  EXAM: CHEST  2 VIEW  COMPARISON:  Radiograph 12/27/2014  FINDINGS: Cardiac silhouette is enlarged. There is bilateral pleural effusions similar prior. There is mild central venous pulmonary congestion. Mild basilar atelectasis.  IMPRESSION: Bilateral pleural effusions and central venous pulmonary congestion. Findings similar to comparison exam.   Electronically Signed   By: Genevive Bi M.D.   On: 01/05/2015 17:11   Dg  Chest 2 View  12/27/2014   CLINICAL DATA:  Chest pain for 1 day.  EXAM: CHEST  2 VIEW  COMPARISON:  12/20/2014.  FINDINGS: Stable enlarged cardiac silhouette. No significant change in bilateral pleural effusions. Post CABG changes are again demonstrated. Evidence of bilateral chronic rotator cuff tears. Left shoulder degenerative changes.  IMPRESSION: Stable cardiomegaly and bilateral pleural effusions.   Electronically Signed   By: Beckie Salts M.D.   On: 12/27/2014 15:47   Dg Chest 2 View  12/20/2014   CLINICAL DATA:  Congestive failure  EXAM: CHEST  2 VIEW  COMPARISON:  12/15/2014  FINDINGS: The cardiac shadow remains enlarged. A right-sided pleural effusion and right basilar atelectasis is again identified. Postsurgical changes are seen. The left lung shows a small effusion but stable. No acute bony abnormality is seen.  IMPRESSION: Bilateral effusions right greater than left. Right basilar atelectasis is noted as well.   Electronically Signed   By: Alcide Clever M.D.   On: 12/20/2014 12:16   Dg Chest 2 View  12/15/2014   CLINICAL DATA:  Shortness of breath, history of aortic valve replacement an acute and chronic CHF  EXAM: CHEST  2 VIEW  COMPARISON:  Portable chest x-ray of December 14, 2014  FINDINGS: The left lung is well-expanded. There is a small left pleural effusion. On the right there is a moderate-sized effusion with basilar atelectasis. The pulmonary interstitial markings remain mildly increased. The cardiac silhouette remains enlarged. There is tortuosity of the descending thoracic aorta.  IMPRESSION: COPD with superimposed CHF. Slight interval improvement in the appearance of the pulmonary interstitium has occurred since yesterday's study. There is persistent pleural fluid and basilar atelectasis on the right.   Electronically Signed   By: David  [place]   On: 12/15/2014 08:07   Ct Head Wo Contrast  01/10/2015   CLINICAL DATA:  Drowsiness  EXAM: CT HEAD WITHOUT CONTRAST  TECHNIQUE: Contiguous  axial images were obtained from the base of the skull through the vertex without intravenous contrast.  COMPARISON:  None.  FINDINGS: Bony calvarium is intact. Mild atrophic changes are seen. No findings to suggest acute hemorrhage, acute infarction or space-occupying mass lesion are identified.  IMPRESSION: Mild atrophic  changes without acute abnormality.   Electronically Signed   By: Alcide Clever M.D.   On: 01/10/2015 12:35   US Renal  12/29/2014   CLINICAL DATA:  Acute renal failure  EXAM: RENAL/URINARY TRACT ULTRASOUND COMPLETE  COMPARISON:  None.  FINDINGS: Right Kidney:  Length: 9.1 cm. Diffuse cortical thinning is noted. No focal mass lesion or hydronephrosis is noted.  Left Kidney:  Length: 10.2 cm. Echogenicity within normal limits. No mass or hydronephrosis visualized.  Bladder:  Appears normal for degree of bladder distention.  A 3.2 cm hepatic cyst is noted. Small bilateral pleural effusions are noted.  IMPRESSION: Mild cortical thinning in the right kidney.  No mass lesion or hydronephrosis is noted.  Small bilateral pleural effusions.  Hepatic cyst.   Electronically Signed   By: Alcide Clever M.D.   On: 12/29/2014 11:28   Ir Fluoro Guide Cv Line Right  01/10/2015   CLINICAL DATA:  Multiple comorbidities including atrial fibrillation, hypertension, CHF, morbid obesity, poor peripheral access  EXAM: POWER PICC LINE PLACEMENT WITH ULTRASOUND AND FLUOROSCOPIC GUIDANCE  FLUOROSCOPY TIME:  30 seconds, 9 mGy  PROCEDURE: The patient was advised of the possible risks andcomplications and agreed to undergo the procedure. The patient was then brought to the angiographic suite for the procedure.  The right arm was prepped with chlorhexidine, drapedin the usual sterile fashion using maximum barrier technique (cap and mask, sterile gown, sterile gloves, large sterile sheet, hand hygiene and cutaneous antisepsis) and infiltrated locally with 1% Lidocaine.  Ultrasound demonstrated patency of the right brachial  vein, and this was documented with an image. Under real-time ultrasound guidance, this vein was accessed with a 21 gauge micropuncture needle and image documentation was performed. A 0.018 wire was introduced in to the vein. Over this, a 5 [place] double lumen power PICC was advanced to the lower SVC/right atrial junction. Fluoroscopy during the procedure and fluoro spot radiograph confirms appropriate catheter position. The catheter was flushed and covered with asterile dressing.  Catheter length: 38  Complications: None  IMPRESSION: Successful right arm power PICC line placement with ultrasound and fluoroscopic guidance. The catheter is ready for use.   Electronically Signed   By: Judie Petit.  Shick M.D.   On: 01/10/2015 12:56   Ir US Guide Vasc Access Right  01/10/2015   CLINICAL DATA:  Multiple comorbidities including atrial fibrillation, hypertension, CHF, morbid obesity, poor peripheral access  EXAM: POWER PICC LINE PLACEMENT WITH ULTRASOUND AND FLUOROSCOPIC GUIDANCE  FLUOROSCOPY TIME:  30 seconds, 9 mGy  PROCEDURE: The patient was advised of the possible risks andcomplications and agreed to undergo the procedure. The patient was then brought to the angiographic suite for the procedure.  The right arm was prepped with chlorhexidine, drapedin the usual sterile fashion using maximum barrier technique (cap and mask, sterile gown, sterile gloves, large sterile sheet, hand hygiene and cutaneous antisepsis) and infiltrated locally with 1% Lidocaine.  Ultrasound demonstrated patency of the right brachial vein, and this was documented with an image. Under real-time ultrasound guidance, this vein was accessed with a 21 gauge micropuncture needle and image documentation was performed. A 0.018 wire was introduced in to the vein. Over this, a 5 [place] double lumen power PICC was advanced to the lower SVC/right atrial junction. Fluoroscopy during the procedure and fluoro spot radiograph confirms appropriate catheter position. The  catheter was flushed and covered with asterile dressing.  Catheter length: 38  Complications: None  IMPRESSION: Successful right arm power PICC line placement with ultrasound and  fluoroscopic guidance. The catheter is ready for use.   Electronically Signed   By: Judie Petit.  Shick M.D.   On: 01/10/2015 12:56   Dg Chest Port 1 View  01/07/2015   CLINICAL DATA:  Respiratory difficulty. Cough. Congestion. Cardiomegaly and CHF. No fever. No chest pain.  EXAM: PORTABLE CHEST - 1 VIEW  COMPARISON:  01/05/2015  FINDINGS: Heart is enlarged. There is moderate right pleural effusion associated right basilar atelectasis. Small left pleural effusion is present. There are changes of interstitial edema.  IMPRESSION: 1. Cardiomegaly and interstitial edema. 2. Persistent bibasilar opacity, right greater than left.   Electronically Signed   By: Norva Pavlov M.D.   On: 01/07/2015 18:02   Dg Chest Port 1 View  12/14/2014   CLINICAL DATA:  Shortness of Breath  EXAM: PORTABLE CHEST - 1 VIEW  COMPARISON:  None.  FINDINGS: There is cardiomegaly with pulmonary venous hypertension. There is a right pleural effusion with airspace consolidation in the right base. There is mild interstitial edema. No adenopathy. Bones are osteoporotic. Patient is status post median sternotomy.  IMPRESSION: Evidence of congestive heart failure. Cannot exclude superimposed pneumonia in the right base.   Electronically Signed   By: Bretta Bang III M.D.   On: 12/14/2014 14:37    CBC  Recent Labs Lab 01/05/15 1528 01/08/15 0419 01/09/15 0305  WBC 3.7* 4.4 3.7*  HGB 10.5* 10.3* 10.3*  HCT 34.6* 34.3* 34.2*  PLT 148* 148* 146*  MCV 104.2* 102.1* 103.0*  MCH 31.6 30.7 31.0  MCHC 30.3 30.0 30.1  RDW 16.1* 16.3* 16.3*    Chemistries   Recent Labs Lab 01/05/15 1528  01/07/15 1837 01/08/15 0419 01/09/15 0305 01/09/15 0910 01/10/15 0308 01/11/15 0630  NA 141  < > 140 141 142  --  140 141  K 4.3  < > 3.9 3.7 3.3*  --  4.2 3.7  CL 105   < > 103 105 105  --  101 101  CO2 28  < > 28 26 28   --  29 30  GLUCOSE 105*  < > 108* 80 87  --  76 77  BUN 43*  < > 39* 41* 38*  --  38* 39*  CREATININE 1.32*  < > 1.42* 1.36* 1.29*  --  1.34* 1.33*  CALCIUM 9.0  < > 8.8 8.8* 8.7*  --  8.9 8.8*  MG  --   --   --   --   --  2.2  --   --   AST 30  --   --   --   --   --  31 26  ALT 14  --   --   --   --   --  14 15  ALKPHOS 74  --   --   --   --   --  68 68  BILITOT 0.7  --   --   --   --   --  0.8 0.8  < > = values in this interval not displayed. ------------------------------------------------------------------------------------------------------------------ estimated creatinine clearance is 39.9 mL/min (by C-G formula based on Cr of 1.33). ------------------------------------------------------------------------------------------------------------------ No results for input(s): HGBA1C in the last 72 hours. ------------------------------------------------------------------------------------------------------------------ No results for input(s): CHOL, HDL, LDLCALC, TRIG, CHOLHDL, LDLDIRECT in the last 72 hours. ------------------------------------------------------------------------------------------------------------------ No results for input(s): TSH, T4TOTAL, T3FREE, THYROIDAB in the last 72 hours.  Invalid input(s): FREET3 ------------------------------------------------------------------------------------------------------------------ No results for input(s): VITAMINB12, FOLATE, FERRITIN, TIBC, IRON, RETICCTPCT in the last 72 hours.  Coagulation  profile  Recent Labs Lab 01/05/15 2322  INR 1.64*    No results for input(s): DDIMER in the last 72 hours.  Cardiac Enzymes  Recent Labs Lab 01/09/15 0305 01/09/15 0910 01/09/15 1503  TROPONINI 0.03 0.03 0.03   ------------------------------------------------------------------------------------------------------------------ Invalid input(s): POCBNP   Recent Labs   01/10/15 1020 01/10/15 1330 01/10/15 1733 01/10/15 1813 01/10/15 2201 01/11/15 0926  GLUCAP 78 79 65* 112* 103* 72     Jylan Loeza M.D. [facility] 01/11/2015, 10:37 AM  Pager: [phone]   Between 7am to 7pm - call Pager - [phone]  After 7pm go to [email or web address] - password [email or web address]  Call night coverage person covering after 7pm

## 2015-01-11 NOTE — Progress Notes (Signed)
PA from radiology left order for IV team to come and change dressing did not actually speak to him tried to page but no answer. PICC dressinh had just been changed at 0600 this am. Pt is on Eliquis and clot that formed at site of PICC was left placing new biopatch next to it. Afraid if clot was removed pt would bleed from site. DR Bensimhon wanted PA to access whole arm not actually PICC insertion site. PICC was inserted on 5/3 using fluoro. PICC has great blood return and flushes easily. IV team consult to change PICC dressing d/c'd.

## 2015-01-11 NOTE — Progress Notes (Signed)
Advanced Heart Failure Rounding Note   Subjective:     Remains very somnolent. ABG drawn yesterday pco2 50. Ammonia 91. Started on lactulose.   Lasix was increased to 80 IV bid and PICC placed. Co-ox 75%  Urine output remains sluggish.  CVP 26   Objective:   Weight Range:  Vital Signs:   Temp:  [96.8 F (36 C)-97.6 F (36.4 C)] 97.4 F (36.3 C) (05/04 0357) Pulse Rate:  [46-125] 101 (05/04 0700) Resp:  [12-25] 12 (05/04 0700) BP: (82-112)/(45-73) 100/60 mmHg (05/04 0700) SpO2:  [94 %-100 %] 99 % (05/04 0700) Weight:  [112.3 kg (247 lb 9.2 oz)] 112.3 kg (247 lb 9.2 oz) (05/04 0406) Last BM Date: 01/11/15  Weight change: Filed Weights   01/09/15 0500 01/10/15 0500 01/11/15 0406  Weight: 115 kg (253 lb 8.5 oz) 115.5 kg (254 lb 10.1 oz) 112.3 kg (247 lb 9.2 oz)    Intake/Output:   Intake/Output Summary (Last 24 hours) at 01/11/15 0823 Last data filed at 01/11/15 0357  Gross per 24 hour  Intake     80 ml  Output    950 ml  Net   -870 ml     Physical Exam: General:  Elderly. Weak appearing. Very somnolent HEENT: normal Neck: supple. Thick. JVP to jaw . Carotids 2+ bilat; no bruits. No lymphadenopathy or thryomegaly appreciated. Cor: PMI nondisplaced. Irregular rate & rhythm. + RV lift Lungs: basilar crackles  Abdomen: obese nontender, + distended.  Extremities: no cyanosis, clubbing, rash, 3+ edema into thighs. RUE picc with surrounding erythema and possible infiltrate Neuro: extremely somnolent arouses and falls back to sleep  Telemetry:  AF Labs: Basic Metabolic Panel:  Recent Labs Lab 01/07/15 1837 01/08/15 0419 01/09/15 0305 01/09/15 0910 01/10/15 0308 01/11/15 0630  NA 140 141 142  --  140 141  K 3.9 3.7 3.3*  --  4.2 3.7  CL 103 105 105  --  101 101  CO2 --  29 30  GLUCOSE 108* 80 87  --  76 77  BUN 39* 41* 38*  --  38* 39*  CREATININE 1.42* 1.36* 1.29*  --  1.34* 1.33*  CALCIUM 8.8 8.8* 8.7*  --  8.9 8.8*  MG  --   --   --  2.2  --    --     Liver Function Tests:  Recent Labs Lab 01/05/15 1528 01/10/15 0308 01/11/15 0630  AST ALT ALKPHOS 74 68 68  BILITOT 0.7 0.8 0.8  PROT 7.0 6.4* 6.6  ALBUMIN 2.9* 2.7* 2.6*   No results for input(s): LIPASE, AMYLASE in the last 168 hours.  Recent Labs Lab 01/10/15 1605  AMMONIA 91*    CBC:  Recent Labs Lab 01/05/15 1528 01/08/15 0419 01/09/15 0305  WBC 3.7* 4.4 3.7*  HGB 10.5* 10.3* 10.3*  HCT 34.6* 34.3* 34.2*  MCV 104.2* 102.1* 103.0*  PLT 148* 148* 146*    Cardiac Enzymes:  Recent Labs Lab 01/05/15 1528 01/09/15 0305 01/09/15 0910 01/09/15 1503  TROPONINI 0.03 0.03 0.03 0.03    BNP: BNP (last 3 results)  Recent Labs  12/14/14 1410 12/27/14 0449 01/05/15 1528  BNP 1222.9* 1099.7* 972.1*    ProBNP (last 3 results) No results for input(s): PROBNP in the last 8760 hours.    Other results:  Imaging: Ct Head Wo Contrast  01/10/2015   CLINICAL DATA:  Drowsiness  EXAM: CT HEAD WITHOUT CONTRAST  TECHNIQUE: Contiguous  axial images were obtained from the base of the skull through the vertex without intravenous contrast.  COMPARISON:  None.  FINDINGS: Bony calvarium is intact. Mild atrophic changes are seen. No findings to suggest acute hemorrhage, acute infarction or space-occupying mass lesion are identified.  IMPRESSION: Mild atrophic changes without acute abnormality.   Electronically Signed   By: Alcide Clever M.D.   On: 01/10/2015 12:35   Ir Fluoro Guide Cv Line Right  01/10/2015   CLINICAL DATA:  Multiple comorbidities including atrial fibrillation, hypertension, CHF, morbid obesity, poor peripheral access  EXAM: POWER PICC LINE PLACEMENT WITH ULTRASOUND AND FLUOROSCOPIC GUIDANCE  FLUOROSCOPY TIME:  30 seconds, 9 mGy  PROCEDURE: The patient was advised of the possible risks andcomplications and agreed to undergo the procedure. The patient was then brought to the angiographic suite for the procedure.  The right arm was  prepped with chlorhexidine, drapedin the usual sterile fashion using maximum barrier technique (cap and mask, sterile gown, sterile gloves, large sterile sheet, hand hygiene and cutaneous antisepsis) and infiltrated locally with 1% Lidocaine.  Ultrasound demonstrated patency of the right brachial vein, and this was documented with an image. Under real-time ultrasound guidance, this vein was accessed with a 21 gauge micropuncture needle and image documentation was performed. A 0.018 wire was introduced in to the vein. Over this, a 5 [place] double lumen power PICC was advanced to the lower SVC/right atrial junction. Fluoroscopy during the procedure and fluoro spot radiograph confirms appropriate catheter position. The catheter was flushed and covered with asterile dressing.  Catheter length: 38  Complications: None  IMPRESSION: Successful right arm power PICC line placement with ultrasound and fluoroscopic guidance. The catheter is ready for use.   Electronically Signed   By: Judie Petit.  Shick M.D.   On: 01/10/2015 12:56   Ir US Guide Vasc Access Right  01/10/2015   CLINICAL DATA:  Multiple comorbidities including atrial fibrillation, hypertension, CHF, morbid obesity, poor peripheral access  EXAM: POWER PICC LINE PLACEMENT WITH ULTRASOUND AND FLUOROSCOPIC GUIDANCE  FLUOROSCOPY TIME:  30 seconds, 9 mGy  PROCEDURE: The patient was advised of the possible risks andcomplications and agreed to undergo the procedure. The patient was then brought to the angiographic suite for the procedure.  The right arm was prepped with chlorhexidine, drapedin the usual sterile fashion using maximum barrier technique (cap and mask, sterile gown, sterile gloves, large sterile sheet, hand hygiene and cutaneous antisepsis) and infiltrated locally with 1% Lidocaine.  Ultrasound demonstrated patency of the right brachial vein, and this was documented with an image. Under real-time ultrasound guidance, this vein was accessed with a 21 gauge  micropuncture needle and image documentation was performed. A 0.018 wire was introduced in to the vein. Over this, a 5 [place] double lumen power PICC was advanced to the lower SVC/right atrial junction. Fluoroscopy during the procedure and fluoro spot radiograph confirms appropriate catheter position. The catheter was flushed and covered with asterile dressing.  Catheter length: 38  Complications: None  IMPRESSION: Successful right arm power PICC line placement with ultrasound and fluoroscopic guidance. The catheter is ready for use.   Electronically Signed   By: Judie Petit.  Shick M.D.   On: 01/10/2015 12:56     Medications:     Scheduled Medications: . antiseptic oral rinse  7 mL Mouth Rinse BID  . apixaban  5 mg Oral BID  . bisoprolol  2.5 mg Oral Daily  . colesevelam  1,875 mg Oral BID WC  . docusate sodium  100 mg  Oral BID  . ferrous fumarate  1 tablet Oral Daily  . furosemide  80 mg Intravenous BID  . gabapentin  300 mg Oral BID  . lactulose  20 g Oral TID  . levothyroxine  25 mcg Oral QAC breakfast  . multivitamin  2 tablet Oral Daily  . pantoprazole  40 mg Oral BID  . sodium chloride  3 mL Intravenous Q12H    Infusions:    PRN Medications: sodium chloride, acetaminophen, gi cocktail, ondansetron (ZOFRAN) IV, oxyCODONE, phenazopyridine, rOPINIRole, sodium chloride   Assessment:   1. Acute on chronic diastolic HF R>>L with anasarca 2. Acute on chronic RV failure with acute cor pulmonale 3. Anasarca 4. AoV disease s/p bioprosthetic AVR 5. Morbid obesity 6. Chronic AF 7. Hypoalbuminemia- Albumin 2.7 8. Somnolence - hepatic encephalopathy  Plan/Discussion:    She has severe RHF with massive volume overload. Co-ox ok CVP 26. Will increase lasix 80 iv tid and add metolazone.  May need milrinone.   She has hepatic encephalopathy. Will double lactulose to 40 tid  PICC site concerning nurse will call IR and IV team  Once diuresed will need RHC and VQ as well as outpatient sleep  study.   Continue apixaban for AF  Koby Hartfield,MD 8:23 AM

## 2015-01-11 NOTE — Progress Notes (Signed)
Eval of (R)UE PICC placed yesterday. Functioning fine, but having some ecchymosis around insertion site and looks like some old dark blood under dressing. Will ask IV team to place new sterile dressing.  Brayton El PA-C Interventional Radiology 01/11/2015 2:31 PM

## 2015-01-11 NOTE — Progress Notes (Signed)
  Echocardiogram 2D Echocardiogram has been performed.  Aris Everts 01/11/2015, 2:24 PM

## 2015-01-11 NOTE — Progress Notes (Signed)
CBG 74 Pt ate sausage patty and toast, drank  120 cc of OJ daughter at bedside

## 2015-01-12 ENCOUNTER — Inpatient Hospital Stay (HOSPITAL_COMMUNITY): Payer: Medicare Other

## 2015-01-12 DIAGNOSIS — I509 Heart failure, unspecified: Secondary | ICD-10-CM

## 2015-01-12 LAB — COMPREHENSIVE METABOLIC PANEL
ALBUMIN: 2.8 g/dL — AB (ref 3.5–5.0)
ALT: 14 U/L (ref 14–54)
AST: 25 U/L (ref 15–41)
Alkaline Phosphatase: 75 U/L (ref 38–126)
Anion gap: 7 (ref 5–15)
BILIRUBIN TOTAL: 0.8 mg/dL (ref 0.3–1.2)
BUN: 35 mg/dL — ABNORMAL HIGH (ref 6–20)
CHLORIDE: 105 mmol/L (ref 101–111)
CO2: 32 mmol/L (ref 22–32)
CREATININE: 1.37 mg/dL — AB (ref 0.44–1.00)
Calcium: 9.2 mg/dL (ref 8.9–10.3)
GFR calc Af Amer: 41 mL/min — ABNORMAL LOW (ref >60)
GFR calc non Af Amer: 35 mL/min — ABNORMAL LOW (ref >60)
Glucose, Bld: 110 mg/dL — ABNORMAL HIGH (ref 70–99)
Potassium: 3.6 mmol/L (ref 3.5–5.1)
Sodium: 144 mmol/L (ref 135–145)
TOTAL PROTEIN: 6.9 g/dL (ref 6.5–8.1)

## 2015-01-12 LAB — CARBOXYHEMOGLOBIN
Carboxyhemoglobin: 1.2 % (ref 0.5–1.5)
Methemoglobin: 1 % (ref 0.0–1.5)
O2 SAT: 76.2 %
Total hemoglobin: 10.5 g/dL — ABNORMAL LOW (ref 12.0–16.0)

## 2015-01-12 LAB — BLOOD GAS, ARTERIAL
Acid-Base Excess: 5 mmol/L — ABNORMAL HIGH (ref 0.0–2.0)
Bicarbonate: 30.2 mEq/L — ABNORMAL HIGH (ref 20.0–24.0)
Drawn by: 319961
O2 Content: 2 L/min
O2 Saturation: 97 %
PATIENT TEMPERATURE: 98.6
PH ART: 7.358 (ref 7.350–7.450)
TCO2: 31.9 mmol/L (ref 0–100)
pCO2 arterial: 55 mmHg — ABNORMAL HIGH (ref 35.0–45.0)
pO2, Arterial: 99.8 mmHg (ref 80.0–100.0)

## 2015-01-12 LAB — GLUCOSE, CAPILLARY
GLUCOSE-CAPILLARY: 94 mg/dL (ref 70–99)
GLUCOSE-CAPILLARY: 106 mg/dL — AB (ref 70–99)
GLUCOSE-CAPILLARY: 115 mg/dL — AB (ref 70–99)
Glucose-Capillary: 107 mg/dL — ABNORMAL HIGH (ref 70–99)

## 2015-01-12 LAB — AMMONIA: Ammonia: 51 umol/L — ABNORMAL HIGH (ref 9–35)

## 2015-01-12 MED ORDER — HYDROCOD POLST-CPM POLST ER 10-8 MG/5ML PO SUER
5.0000 mL | Freq: Once | ORAL | Status: AC
  Administered 2015-01-12: 5 mL via ORAL
  Filled 2015-01-12: qty 5

## 2015-01-12 MED ORDER — LEVOTHYROXINE SODIUM 25 MCG PO TABS
25.0000 ug | ORAL_TABLET | Freq: Once | ORAL | Status: AC
  Administered 2015-01-12: 25 ug via ORAL
  Filled 2015-01-12: qty 1

## 2015-01-12 MED ORDER — LEVOTHYROXINE SODIUM 50 MCG PO TABS
50.0000 ug | ORAL_TABLET | Freq: Every day | ORAL | Status: DC
  Administered 2015-01-13 – 2015-01-27 (×14): 50 ug via ORAL
  Filled 2015-01-12 (×17): qty 1

## 2015-01-12 NOTE — Progress Notes (Signed)
[facility]                                                                              Patient Demographics  Melanie Camacho, is a 81 y.o. female, DOB - 1931/11/13, ZOX:[id number]  Admit date - 01/05/2015   Admitting Physician Lorretta Harp, MD  Outpatient Primary MD for the patient is PROVIDER NOT IN SYSTEM  LOS - 7   Chief Complaint  Patient presents with  . Shortness of Breath       Brief HPI   Patient is a 81 year old female with hypertension, diabetes, diet controlled, DM, CAD, AVR, stage 3 CKD, afib on eliquis, presented on 4/28 for abdominal pain and shortness of breath.  She was evaluated with a CT abdomen and pelvis , which did not reveal any acute intra abdominal findings. There was severe anasarca with moderate right and small left pleural effusions. Patient was admitted for acute on chronic diastolic heart failure and started on IV lasix. From 4/30 and 5/1, patient was hypotensive, tachycardic and her lasix and BB was held. Cardiology and heart failure team were consulted for recommendations.  Patient was noted to be lethargic on 5/3, CT head was negative for acute intracranial pathology, ABG did not show acute hypercapnia, PCO2 of 50, ammonia level was elevated at 91 and started on lactulose   Assessment & Plan    Principal Problem:  Acute on chronic diastolic CHF exacerbation with anasarca, acute on chronic RV failure/cor pulmonale: - Patient still has significant anasarca, heart failure team following, on Lasix and metolazone, may need milrinone. - Negative balance of 4.83 L, weight down from 254-> 247lbs today  Bradycardia overnight with heart rhythm issues - Cardiology following, will follow recommendations  Atrial fibrillation; Rate controlled, continue eliquis  Acute encephalopathy: No significant improvement this morning, still lethargic - Unclear etiology, CT head is negative, MRI of the brain this morning showed atrophy and mild chronic  microvascular ischemia, no acute infarct - I discontinued Neurontin, Requip - Repeat ammonia level down to 51, continue on lactulose, 4-5 BMs in a day, check ABG  - TSH 15.71, increased Synthroid to 50 MCG daily  -If no further improvement in overall status, will likely need palliative medicine consult for goals of care.  Hypothyroidism: Continue Synthroid, increased to 50 MCG daily, TSH was 7.7 on 4/8, now 15.717.  Restless leg syndrome: Discontinued Requip and Neurontin due to mental status changes  Mild macrocytic anemia; Stable continue to monitor.   Hypotension: Improved. unclear etiology, afebrile, no leukocytosis - UA is negative for infection. CXR on 4/30 did not show any consolidation.  Stage 3 CKD;  - aapears at baseline  Code Status: full code  Family Communication: No family member at bed side   Disposition Plan: remains in stepdown  Time Spent in minutes  25 minutes  Procedures  None  Consults    cardiology  DVT Prophylaxis  eliquis  Medications  Scheduled Meds: . antiseptic oral rinse  7 mL Mouth Rinse BID  . apixaban  5 mg Oral BID  . bisoprolol  2.5 mg Oral Daily  . colesevelam  1,875 mg Oral BID WC  .  docusate sodium  100 mg Oral BID  . ferrous fumarate  1 tablet Oral Daily  . furosemide  80 mg Intravenous 3 times per day  . lactulose  40 g Oral TID  . [START ON 01/13/2015] levothyroxine  50 mcg Oral QAC breakfast  . metolazone  5 mg Oral BID  . multivitamin  2 tablet Oral Daily  . pantoprazole  40 mg Oral BID  . potassium chloride  40 mEq Oral BID  . sodium chloride  3 mL Intravenous Q12H   Continuous Infusions:  PRN Meds:.sodium chloride, acetaminophen, gi cocktail, ondansetron (ZOFRAN) IV, oxyCODONE, phenazopyridine, sodium chloride   Antibiotics   Anti-infectives    None        Subjective:   Melanie Camacho was seen and examined today. Still very somnolent, however per RN, was agitated at night and trying to get out of the bed.  This morning, patient oriented to self only, was not able to engage in any conversation. Events overnight with heart rhythm noted. denies any chest pain, nausea, vomiting, abdominal pain.  Objective:   Blood pressure 104/56, pulse 70, temperature 97.3 F (36.3 C), temperature source Axillary, resp. rate 23, height 5\' 2"  (1.575 m), weight 107.3 kg (236 lb 8.9 oz), SpO2 98 %.  Wt Readings from Last 3 Encounters:  01/12/15 107.3 kg (236 lb 8.9 oz)  12/30/14 104.599 kg (230 lb 9.6 oz)  12/29/13 87.7 kg (193 lb 5.5 oz)     Intake/Output Summary (Last 24 hours) at 01/12/15 1309 Last data filed at 01/12/15 1214  Gross per 24 hour  Intake    303 ml  Output   2678 ml  Net  -2375 ml    Exam  Gen: Somnolent but opens eyes to verbal commands, oriented  self, able to say yes and no  HEENT:  PERRLA, EOMI,  Neck: Supple,JVD +, no masses  CVS:  irregularly irregular  Resp:  Bibasilar crackles, no rhonchi or wheezing  Abdomen:  anasarca, Soft, nontender,distended, + bowel sounds  Ext: no cyanosis clubbing, 3+ edema  Neuro: did not cooperate with neuro exam   Skin: No rashes  Psych:   Somnolent    Data Review   Micro Results Recent Results (from the past 240 hour(s))  Culture, Urine     Status: None   Collection Time: 01/07/15 12:30 AM  Result Value Ref Range Status   Specimen Description URINE, CATHETERIZED  Final   Special Requests n/a Immunocompromised  Final   Colony Count NO GROWTH Performed at [facility]   Final   Culture NO GROWTH Performed at [facility]   Final   Report Status 01/08/2015 FINAL  Final    Radiology Reports Ct Abdomen Pelvis Wo Contrast  01/06/2015   CLINICAL DATA:  Intermittent right lower quadrant pain for several days.  EXAM: CT ABDOMEN AND PELVIS WITHOUT CONTRAST  TECHNIQUE: Multidetector CT imaging of the abdomen and pelvis was performed following the standard protocol without IV contrast.  COMPARISON:  None.  FINDINGS:  BODY WALL: Severe anasarca. The abdominal wall is incompletely imaged but there is no gross fluid collection. Lax abdominal wall with wide necked herniation below the umbilicus, containing nonobstructed small bowel. Muscular or fascia calcification in the abdominal wall which could be dystrophic or from previous surgery.  LOWER CHEST: Moderate right and small left pleural effusions with multi segment atelectasis.  Aortic valve replacement. There is mild cardiomegaly and extensive coronary atherosclerosis.  ABDOMEN/PELVIS:  Liver: Large liver, with the right  lobe projecting to the iliac crest. Rounded masses in the right liver are near water density, excluding a 2 cm lesion in segment 6 which was likely not visualized/evaluated on sonography from 8 days ago.  Biliary: Cholecystectomy. There is suggestion of a choledochoenterostomy, although there is no pneumobilia.  Pancreas: Unremarkable.  Spleen: Unremarkable.  Adrenals: Unremarkable.  Kidneys and ureters: No hydronephrosis or stone.  Bladder: Decompressed by a Foley catheter.  Reproductive: Unremarkable for age.  Bowel: Distal gastrectomy. The anastomoses are patent and there is no surrounding inflammatory change. No pericecal inflammation. No bowel obstruction.  Retroperitoneum: No mass or adenopathy.  Peritoneum: Small perihepatic ascites, likely from volume overload.  Vascular: No acute abnormality.  Diffuse arterial calcification.  OSSEOUS: No acute abnormalities.  IMPRESSION: 1. No acute intra-abdominal findings. 2. Severe anasarca. Moderate right and small left pleural effusions. 3. A 2 cm lesion in segment 6 is indeterminate due to density. Other liver lesions appear cystic. 4. Ventral abdominal hernia containing nonobstructed small bowel. 5. Distal gastrectomy.   Electronically Signed   By: Marnee Spring M.D.   On: 01/06/2015 10:36   Dg Chest 2 View  01/05/2015   CLINICAL DATA:  Congestive heart failure shortness breath.  EXAM: CHEST  2 VIEW   COMPARISON:  Radiograph 12/27/2014  FINDINGS: Cardiac silhouette is enlarged. There is bilateral pleural effusions similar prior. There is mild central venous pulmonary congestion. Mild basilar atelectasis.  IMPRESSION: Bilateral pleural effusions and central venous pulmonary congestion. Findings similar to comparison exam.   Electronically Signed   By: Genevive Bi M.D.   On: 01/05/2015 17:11   Dg Chest 2 View  12/27/2014   CLINICAL DATA:  Chest pain for 1 day.  EXAM: CHEST  2 VIEW  COMPARISON:  12/20/2014.  FINDINGS: Stable enlarged cardiac silhouette. No significant change in bilateral pleural effusions. Post CABG changes are again demonstrated. Evidence of bilateral chronic rotator cuff tears. Left shoulder degenerative changes.  IMPRESSION: Stable cardiomegaly and bilateral pleural effusions.   Electronically Signed   By: Beckie Salts M.D.   On: 12/27/2014 15:47   Dg Chest 2 View  12/20/2014   CLINICAL DATA:  Congestive failure  EXAM: CHEST  2 VIEW  COMPARISON:  12/15/2014  FINDINGS: The cardiac shadow remains enlarged. A right-sided pleural effusion and right basilar atelectasis is again identified. Postsurgical changes are seen. The left lung shows a small effusion but stable. No acute bony abnormality is seen.  IMPRESSION: Bilateral effusions right greater than left. Right basilar atelectasis is noted as well.   Electronically Signed   By: Alcide Clever M.D.   On: 12/20/2014 12:16   Dg Chest 2 View  12/15/2014   CLINICAL DATA:  Shortness of breath, history of aortic valve replacement an acute and chronic CHF  EXAM: CHEST  2 VIEW  COMPARISON:  Portable chest x-ray of December 14, 2014  FINDINGS: The left lung is well-expanded. There is a small left pleural effusion. On the right there is a moderate-sized effusion with basilar atelectasis. The pulmonary interstitial markings remain mildly increased. The cardiac silhouette remains enlarged. There is tortuosity of the descending thoracic aorta.   IMPRESSION: COPD with superimposed CHF. Slight interval improvement in the appearance of the pulmonary interstitium has occurred since yesterday's study. There is persistent pleural fluid and basilar atelectasis on the right.   Electronically Signed   By: David  [place]   On: 12/15/2014 08:07   Ct Head Wo Contrast  01/10/2015   CLINICAL DATA:  Drowsiness  EXAM:  CT HEAD WITHOUT CONTRAST  TECHNIQUE: Contiguous axial images were obtained from the base of the skull through the vertex without intravenous contrast.  COMPARISON:  None.  FINDINGS: Bony calvarium is intact. Mild atrophic changes are seen. No findings to suggest acute hemorrhage, acute infarction or space-occupying mass lesion are identified.  IMPRESSION: Mild atrophic changes without acute abnormality.   Electronically Signed   By: Alcide Clever M.D.   On: 01/10/2015 12:35   Mr Brain Wo Contrast  01/12/2015   CLINICAL DATA:  Acute encephalopathy  EXAM: MRI HEAD WITHOUT CONTRAST  TECHNIQUE: Multiplanar, multiecho pulse sequences of the brain and surrounding structures were obtained without intravenous contrast.  COMPARISON:  CT head 01/10/2015  FINDINGS: Image quality degraded by mild to moderate motion.  Generalized atrophy. Negative for hydrocephalus. Pituitary normal in size  Negative for acute infarct  Chronic microvascular ischemic changes in the white matter are mild. No cortical infarct. No brainstem lesion. Cerebellum intact  Negative for intracranial hemorrhage  12 mm calcified mass over the convexity on the left compatible with meningioma. No edema in the adjacent brain.  IMPRESSION: Atrophy and mild chronic microvascular ischemia.  No acute infarct  12 mm calcified meningioma over the convexity on the left.   Electronically Signed   By: Marlan Palau M.D.   On: 01/12/2015 10:02   US Renal  12/29/2014   CLINICAL DATA:  Acute renal failure  EXAM: RENAL/URINARY TRACT ULTRASOUND COMPLETE  COMPARISON:  None.  FINDINGS: Right Kidney:  Length: 9.1  cm. Diffuse cortical thinning is noted. No focal mass lesion or hydronephrosis is noted.  Left Kidney:  Length: 10.2 cm. Echogenicity within normal limits. No mass or hydronephrosis visualized.  Bladder:  Appears normal for degree of bladder distention.  A 3.2 cm hepatic cyst is noted. Small bilateral pleural effusions are noted.  IMPRESSION: Mild cortical thinning in the right kidney.  No mass lesion or hydronephrosis is noted.  Small bilateral pleural effusions.  Hepatic cyst.   Electronically Signed   By: Alcide Clever M.D.   On: 12/29/2014 11:28   Ir Fluoro Guide Cv Line Right  01/10/2015   CLINICAL DATA:  Multiple comorbidities including atrial fibrillation, hypertension, CHF, morbid obesity, poor peripheral access  EXAM: POWER PICC LINE PLACEMENT WITH ULTRASOUND AND FLUOROSCOPIC GUIDANCE  FLUOROSCOPY TIME:  30 seconds, 9 mGy  PROCEDURE: The patient was advised of the possible risks andcomplications and agreed to undergo the procedure. The patient was then brought to the angiographic suite for the procedure.  The right arm was prepped with chlorhexidine, drapedin the usual sterile fashion using maximum barrier technique (cap and mask, sterile gown, sterile gloves, large sterile sheet, hand hygiene and cutaneous antisepsis) and infiltrated locally with 1% Lidocaine.  Ultrasound demonstrated patency of the right brachial vein, and this was documented with an image. Under real-time ultrasound guidance, this vein was accessed with a 21 gauge micropuncture needle and image documentation was performed. A 0.018 wire was introduced in to the vein. Over this, a 5 [place] double lumen power PICC was advanced to the lower SVC/right atrial junction. Fluoroscopy during the procedure and fluoro spot radiograph confirms appropriate catheter position. The catheter was flushed and covered with asterile dressing.  Catheter length: 38  Complications: None  IMPRESSION: Successful right arm power PICC line placement with ultrasound  and fluoroscopic guidance. The catheter is ready for use.   Electronically Signed   By: Judie Petit.  Shick M.D.   On: 01/10/2015 12:56   Ir US Guide Vasc  Access Right  01/10/2015   CLINICAL DATA:  Multiple comorbidities including atrial fibrillation, hypertension, CHF, morbid obesity, poor peripheral access  EXAM: POWER PICC LINE PLACEMENT WITH ULTRASOUND AND FLUOROSCOPIC GUIDANCE  FLUOROSCOPY TIME:  30 seconds, 9 mGy  PROCEDURE: The patient was advised of the possible risks andcomplications and agreed to undergo the procedure. The patient was then brought to the angiographic suite for the procedure.  The right arm was prepped with chlorhexidine, drapedin the usual sterile fashion using maximum barrier technique (cap and mask, sterile gown, sterile gloves, large sterile sheet, hand hygiene and cutaneous antisepsis) and infiltrated locally with 1% Lidocaine.  Ultrasound demonstrated patency of the right brachial vein, and this was documented with an image. Under real-time ultrasound guidance, this vein was accessed with a 21 gauge micropuncture needle and image documentation was performed. A 0.018 wire was introduced in to the vein. Over this, a 5 [place] double lumen power PICC was advanced to the lower SVC/right atrial junction. Fluoroscopy during the procedure and fluoro spot radiograph confirms appropriate catheter position. The catheter was flushed and covered with asterile dressing.  Catheter length: 38  Complications: None  IMPRESSION: Successful right arm power PICC line placement with ultrasound and fluoroscopic guidance. The catheter is ready for use.   Electronically Signed   By: Judie Petit.  Shick M.D.   On: 01/10/2015 12:56   Dg Chest Port 1 View  01/07/2015   CLINICAL DATA:  Respiratory difficulty. Cough. Congestion. Cardiomegaly and CHF. No fever. No chest pain.  EXAM: PORTABLE CHEST - 1 VIEW  COMPARISON:  01/05/2015  FINDINGS: Heart is enlarged. There is moderate right pleural effusion associated right basilar  atelectasis. Small left pleural effusion is present. There are changes of interstitial edema.  IMPRESSION: 1. Cardiomegaly and interstitial edema. 2. Persistent bibasilar opacity, right greater than left.   Electronically Signed   By: Norva Pavlov M.D.   On: 01/07/2015 18:02   Dg Chest Port 1 View  12/14/2014   CLINICAL DATA:  Shortness of Breath  EXAM: PORTABLE CHEST - 1 VIEW  COMPARISON:  None.  FINDINGS: There is cardiomegaly with pulmonary venous hypertension. There is a right pleural effusion with airspace consolidation in the right base. There is mild interstitial edema. No adenopathy. Bones are osteoporotic. Patient is status post median sternotomy.  IMPRESSION: Evidence of congestive heart failure. Cannot exclude superimposed pneumonia in the right base.   Electronically Signed   By: Bretta Bang III M.D.   On: 12/14/2014 14:37    CBC  Recent Labs Lab 01/05/15 1528 01/08/15 0419 01/09/15 0305  WBC 3.7* 4.4 3.7*  HGB 10.5* 10.3* 10.3*  HCT 34.6* 34.3* 34.2*  PLT 148* 148* 146*  MCV 104.2* 102.1* 103.0*  MCH 31.6 30.7 31.0  MCHC 30.3 30.0 30.1  RDW 16.1* 16.3* 16.3*    Chemistries   Recent Labs Lab 01/05/15 1528  01/08/15 0419 01/09/15 0305 01/09/15 0910 01/10/15 0308 01/11/15 0630 01/12/15 0500  NA 141  < > 141 142  --  140 141 144  K 4.3  < > 3.7 3.3*  --  4.2 3.7 3.6  CL 105  < > 105 105  --  101 101 105  CO2 28  < > 26 28  --  29 30 32  GLUCOSE 105*  < > 80 87  --  76 77 110*  BUN 43*  < > 41* 38*  --  38* 39* 35*  CREATININE 1.32*  < > 1.36* 1.29*  --  1.34*  1.33* 1.37*  CALCIUM 9.0  < > 8.8* 8.7*  --  8.9 8.8* 9.2  MG  --   --   --   --  2.2  --   --   --   AST 30  --   --   --   --  ALT 14  --   --   --   --  ALKPHOS 74  --   --   --   --  68 68 75  BILITOT 0.7  --   --   --   --  0.8 0.8 0.8  < > = values in this interval not  displayed. ------------------------------------------------------------------------------------------------------------------ estimated creatinine clearance is 37.7 mL/min (by C-G formula based on Cr of 1.37). ------------------------------------------------------------------------------------------------------------------ No results for input(s): HGBA1C in the last 72 hours. ------------------------------------------------------------------------------------------------------------------ No results for input(s): CHOL, HDL, LDLCALC, TRIG, CHOLHDL, LDLDIRECT in the last 72 hours. ------------------------------------------------------------------------------------------------------------------  Recent Labs  01/11/15 1350  TSH 15.717*   ------------------------------------------------------------------------------------------------------------------ No results for input(s): VITAMINB12, FOLATE, FERRITIN, TIBC, IRON, RETICCTPCT in the last 72 hours.  Coagulation profile  Recent Labs Lab 01/05/15 2322  INR 1.64*    No results for input(s): DDIMER in the last 72 hours.  Cardiac Enzymes  Recent Labs Lab 01/09/15 0305 01/09/15 0910 01/09/15 1503  TROPONINI 0.03 0.03 0.03   ------------------------------------------------------------------------------------------------------------------ Invalid input(s): POCBNP   Recent Labs  01/11/15 0926 01/11/15 1158 01/11/15 1552 01/11/15 2200 01/12/15 0759 01/12/15 1156  GLUCAP 72 93 108* 122* 94 107*     RAI,RIPUDEEP M.D. [facility] 01/12/2015, 1:09 PM  Pager: [phone]   Between 7am to 7pm - call Pager - [phone]  After 7pm go to [email or web address] - password [email or web address]  Call night coverage person covering after 7pm

## 2015-01-12 NOTE — Progress Notes (Signed)
Notified Cardiology that pts pulse  is reading in the 20-30. MD to bedside. No new orders, will continue to monitor.

## 2015-01-12 NOTE — Progress Notes (Signed)
Patient ID: Melanie Camacho, female   DOB: 09/17/1931, 81 y.o.   MRN: [id number] Asked to eval pt's RUE PICC site again. On exam PICC is intact/adhesive secure and infusing ok; insertion site is ok without active bleeding; dressing partially removed to examine area; there is an ecchymotic area (most likely related to anticoagulation) with trace SQ fluid lateral to PICC site which is NT and soft; pt denies paresthesias or arm pain/tightness; would have nursing reapply new dressing over site; if sx's change or arm becomes more edematous can US extremity. F/u CXR will confirm stable tip location if needed.

## 2015-01-12 NOTE — Progress Notes (Signed)
Placed an order for IV team to come and assess pts PICC line site. Arm is red around PICC.IV team at bedside and  recommended radiology assess site. Will pass on in shift report to RN and continue to monitor.

## 2015-01-12 NOTE — Progress Notes (Signed)
Pt cardiac lead was reading brady- asystole while pulse oxy detected rhythm. Manual palpation of radial artery was 55 and pt was AO4. [facility]Link was notified and EKG was obtained per [facility].  Tama Gander, NP was notified and Cards was consulted. Per Cards no new orders and follow pulse oxy for heart rate.

## 2015-01-12 NOTE — Progress Notes (Addendum)
Called to evaluate patient for advanced heart block.  Monitor reading HR 0 to 30s.  Patient asymptomatic at this time.  Carefull review of telemetry shows very low voltage QRS complexes with intermittent PVCs, mimicking advanced HB.  Underneath this heart rhythm is a steady pulse on the pulse oximeter tracing, and 12 lead shows low voltage QRS complexes are NOT P waves.  Patient is in a-fib with controlled HR.    Echo from today does not report pericardial effusion, but there is LVH with reduced EF, would consider potenial for amyloid.  Advised nursing team to monitor pulse oximeter for more accurate HR recording.  Dossie Arbour, MD Cardiology Fellow

## 2015-01-12 NOTE — Progress Notes (Signed)
Called to assess RUA PICC line. Area of ecchymosis and tightness lateral to PICC insertion site. Primary RN Marcelino Duster  instructed to notify Interventional radiology to assess further.

## 2015-01-12 NOTE — Progress Notes (Signed)
Advanced Heart Failure Rounding Note   Subjective:     Remains very somnolent. ABG drawn yesterday pco2 55. Ammonia 91-> 51 on lactulose. Requip and neurontin stopped today.  Urine output picking up with addition of metolazone. Co-ox 76%. Renal function stable. K 3.6 CVP 19  Lots of stool with lactulose    Objective:   Weight Range:  Vital Signs:   Temp:  [97.3 F (36.3 C)-97.6 F (36.4 C)] 97.3 F (36.3 C) (05/05 0800) Pulse Rate:  [28-109] 67 (05/05 1603) Resp:  [0-24] 16 (05/05 1603) BP: (80-107)/(50-82) 80/50 mmHg (05/05 1603) SpO2:  [95 %-100 %] 99 % (05/05 1603) Weight:  [236 lb 8.9 oz (107.3 kg)] 236 lb 8.9 oz (107.3 kg) (05/05 0500) Last BM Date: 01/11/15  Weight change: Filed Weights   01/10/15 0500 01/11/15 0406 01/12/15 0500  Weight: 254 lb 10.1 oz (115.5 kg) 247 lb 9.2 oz (112.3 kg) 236 lb 8.9 oz (107.3 kg)    Intake/Output:   Intake/Output Summary (Last 24 hours) at 01/12/15 1700 Last data filed at 01/12/15 1214  Gross per 24 hour  Intake    153 ml  Output   2403 ml  Net  -2250 ml     Physical Exam: General:  Elderly. Weak appearing. More awake HEENT: normal Neck: supple. Thick. JVP to jaw . Carotids 2+ bilat; no bruits. No lymphadenopathy or thryomegaly appreciated. Cor: PMI nondisplaced. Irregular rate & rhythm. + RV lift Lungs: basilar crackles  Abdomen: obese nontender, + distended.  Extremities: no cyanosis, clubbing, rash, 3+ edema into thighs. RUE picc with surrounding erythema and possible infiltrate Neuro: more awake. conversant  Telemetry:  AF Labs: Basic Metabolic Panel:  Recent Labs Lab 01/08/15 0419 01/09/15 0305 01/09/15 0910 01/10/15 0308 01/11/15 0630 01/12/15 0500  NA 141 142  --  140 141 144  K 3.7 3.3*  --  4.2 3.7 3.6  CL 105 105  --  101 101 105  CO2 26 28  --  29 30 32  GLUCOSE 80 87  --  76 77 110*  BUN 41* 38*  --  38* 39* 35*  CREATININE 1.36* 1.29*  --  1.34* 1.33* 1.37*  CALCIUM 8.8* 8.7*  --  8.9 8.8*  9.2  MG  --   --  2.2  --   --   --     Liver Function Tests:  Recent Labs Lab 01/10/15 0308 01/11/15 0630 01/12/15 0500  AST ALT ALKPHOS 68 68 75  BILITOT 0.8 0.8 0.8  PROT 6.4* 6.6 6.9  ALBUMIN 2.7* 2.6* 2.8*   No results for input(s): LIPASE, AMYLASE in the last 168 hours.  Recent Labs Lab 01/10/15 1605 01/12/15 0726  AMMONIA 91* 51*    CBC:  Recent Labs Lab 01/08/15 0419 01/09/15 0305  WBC 4.4 3.7*  HGB 10.3* 10.3*  HCT 34.3* 34.2*  MCV 102.1* 103.0*  PLT 148* 146*    Cardiac Enzymes:  Recent Labs Lab 01/09/15 0305 01/09/15 0910 01/09/15 1503  TROPONINI 0.03 0.03 0.03    BNP: BNP (last 3 results)  Recent Labs  12/14/14 1410 12/27/14 0449 01/05/15 1528  BNP 1222.9* 1099.7* 972.1*    ProBNP (last 3 results) No results for input(s): PROBNP in the last 8760 hours.    Other results:  Imaging: Mr Brain Wo Contrast  01/12/2015   CLINICAL DATA:  Acute encephalopathy  EXAM: MRI HEAD WITHOUT CONTRAST  TECHNIQUE: Multiplanar, multiecho pulse sequences of the brain  and surrounding structures were obtained without intravenous contrast.  COMPARISON:  CT head 01/10/2015  FINDINGS: Image quality degraded by mild to moderate motion.  Generalized atrophy. Negative for hydrocephalus. Pituitary normal in size  Negative for acute infarct  Chronic microvascular ischemic changes in the white matter are mild. No cortical infarct. No brainstem lesion. Cerebellum intact  Negative for intracranial hemorrhage  12 mm calcified mass over the convexity on the left compatible with meningioma. No edema in the adjacent brain.  IMPRESSION: Atrophy and mild chronic microvascular ischemia.  No acute infarct  12 mm calcified meningioma over the convexity on the left.   Electronically Signed   By: Marlan Palau M.D.   On: 01/12/2015 10:02     Medications:     Scheduled Medications: . antiseptic oral rinse  7 mL Mouth Rinse BID  . apixaban  5 mg Oral BID   . bisoprolol  2.5 mg Oral Daily  . colesevelam  1,875 mg Oral BID WC  . docusate sodium  100 mg Oral BID  . ferrous fumarate  1 tablet Oral Daily  . furosemide  80 mg Intravenous 3 times per day  . lactulose  40 g Oral TID  . [START ON 01/13/2015] levothyroxine  50 mcg Oral QAC breakfast  . metolazone  5 mg Oral BID  . multivitamin  2 tablet Oral Daily  . pantoprazole  40 mg Oral BID  . potassium chloride  40 mEq Oral BID  . sodium chloride  3 mL Intravenous Q12H    Infusions:    PRN Medications: sodium chloride, acetaminophen, gi cocktail, ondansetron (ZOFRAN) IV, oxyCODONE, phenazopyridine, sodium chloride   Assessment:   1. Acute on chronic diastolic HF R>>L with anasarca 2. Acute on chronic RV failure with acute cor pulmonale 3. Anasarca 4. AoV disease s/p bioprosthetic AVR 5. Morbid obesity 6. Chronic AF 7. Hypoalbuminemia- Albumin 2.7 8. Somnolence - hepatic encephalopathy  Plan/Discussion:    She has severe RHF with massive volume overload. Urine output picking up. Co-ox good. CVP still up. Continue lasix and metolazone. May need milrinone. RHC prior to d/c - once diuresed.  Continue apixaban for AF  MS improving with lactulose. Brain MRI ok.   Shanira Tine,MD 5:00 PM

## 2015-01-13 LAB — GLUCOSE, CAPILLARY
GLUCOSE-CAPILLARY: 92 mg/dL (ref 70–99)
Glucose-Capillary: 82 mg/dL (ref 70–99)
Glucose-Capillary: 123 mg/dL — ABNORMAL HIGH (ref 70–99)

## 2015-01-13 LAB — CARBOXYHEMOGLOBIN
Carboxyhemoglobin: 1.3 % (ref 0.5–1.5)
METHEMOGLOBIN: 0.9 % (ref 0.0–1.5)
O2 Saturation: 97.8 %
Total hemoglobin: 10.6 g/dL — ABNORMAL LOW (ref 12.0–16.0)

## 2015-01-13 LAB — BASIC METABOLIC PANEL
Anion gap: 9 (ref 5–15)
BUN: 33 mg/dL — AB (ref 6–20)
CO2: 32 mmol/L (ref 22–32)
CREATININE: 1.26 mg/dL — AB (ref 0.44–1.00)
Calcium: 9.3 mg/dL (ref 8.9–10.3)
Chloride: 103 mmol/L (ref 101–111)
GFR calc Af Amer: 45 mL/min — ABNORMAL LOW (ref >60)
GFR, EST NON AFRICAN AMERICAN: 39 mL/min — AB (ref >60)
Glucose, Bld: 105 mg/dL — ABNORMAL HIGH (ref 70–99)
Potassium: 3.6 mmol/L (ref 3.5–5.1)
Sodium: 144 mmol/L (ref 135–145)

## 2015-01-13 LAB — CLOSTRIDIUM DIFFICILE BY PCR: CDIFFPCR: NEGATIVE

## 2015-01-13 MED ORDER — GI COCKTAIL ~~LOC~~
30.0000 mL | Freq: Once | ORAL | Status: AC
  Administered 2015-01-13: 30 mL via ORAL
  Filled 2015-01-13: qty 30

## 2015-01-13 NOTE — Progress Notes (Signed)
Advanced Heart Failure Rounding Note   Subjective:     More awake today. Denies dyspnea. Very weak.   Urine output picking up with addition of metolazone. Co-ox good. Renal function improved. K 3.6 CVP 17  Lots of stool with lactulose. Rectal tube in place.     Objective:   Weight Range:  Vital Signs:   Temp:  [94.6 F (34.8 C)-97.5 F (36.4 C)] 97.5 F (36.4 C) (05/06 0834) Pulse Rate:  [30-109] 109 (05/06 0834) Resp:  [0-24] 24 (05/06 0834) BP: (80-119)/(39-90) 116/90 mmHg (05/06 0834) SpO2:  [87 %-100 %] 100 % (05/06 0834) Weight:  [110.3 kg (243 lb 2.7 oz)] 110.3 kg (243 lb 2.7 oz) (05/06 0500) Last BM Date: 01/12/15  Weight change: Filed Weights   01/11/15 0406 01/12/15 0500 01/13/15 0500  Weight: 112.3 kg (247 lb 9.2 oz) 107.3 kg (236 lb 8.9 oz) 110.3 kg (243 lb 2.7 oz)    Intake/Output:   Intake/Output Summary (Last 24 hours) at 01/13/15 1315 Last data filed at 01/13/15 0944  Gross per 24 hour  Intake    126 ml  Output   2650 ml  Net  -2524 ml     Physical Exam: General:  Elderly. Weak appearing. More awake HEENT: normal Neck: supple. Thick. JVP to jaw . Carotids 2+ bilat; no bruits. No lymphadenopathy or thryomegaly appreciated. Cor: PMI nondisplaced. Irregular rate & rhythm. + RV lift Lungs: basilar crackles  Abdomen: obese nontender, + distended.  Extremities: no cyanosis, clubbing, rash, 3+ edema into thighs. RUE picc with surrounding erythema and possible infiltrate Neuro: more awake. conversant  Telemetry:  AF 70-80s Labs: Basic Metabolic Panel:  Recent Labs Lab 01/09/15 0305 01/09/15 0910 01/10/15 0308 01/11/15 0630 01/12/15 0500 01/13/15 0500  NA 142  --  140 141 144 144  K 3.3*  --  4.2 3.7 3.6 3.6  CL 105  --  101 101 105 103  CO2 28  --  29 30 32 32  GLUCOSE 87  --  76 77 110* 105*  BUN 38*  --  38* 39* 35* 33*  CREATININE 1.29*  --  1.34* 1.33* 1.37* 1.26*  CALCIUM 8.7*  --  8.9 8.8* 9.2 9.3  MG  --  2.2  --   --   --   --      Liver Function Tests:  Recent Labs Lab 01/10/15 0308 01/11/15 0630 01/12/15 0500  AST ALT ALKPHOS 68 68 75  BILITOT 0.8 0.8 0.8  PROT 6.4* 6.6 6.9  ALBUMIN 2.7* 2.6* 2.8*   No results for input(s): LIPASE, AMYLASE in the last 168 hours.  Recent Labs Lab 01/10/15 1605 01/12/15 0726  AMMONIA 91* 51*    CBC:  Recent Labs Lab 01/08/15 0419 01/09/15 0305  WBC 4.4 3.7*  HGB 10.3* 10.3*  HCT 34.3* 34.2*  MCV 102.1* 103.0*  PLT 148* 146*    Cardiac Enzymes:  Recent Labs Lab 01/09/15 0305 01/09/15 0910 01/09/15 1503  TROPONINI 0.03 0.03 0.03    BNP: BNP (last 3 results)  Recent Labs  12/14/14 1410 12/27/14 0449 01/05/15 1528  BNP 1222.9* 1099.7* 972.1*    ProBNP (last 3 results) No results for input(s): PROBNP in the last 8760 hours.    Other results:  Imaging: Mr Brain Wo Contrast  01/12/2015   CLINICAL DATA:  Acute encephalopathy  EXAM: MRI HEAD WITHOUT CONTRAST  TECHNIQUE: Multiplanar, multiecho pulse sequences of the brain and surrounding structures were  obtained without intravenous contrast.  COMPARISON:  CT head 01/10/2015  FINDINGS: Image quality degraded by mild to moderate motion.  Generalized atrophy. Negative for hydrocephalus. Pituitary normal in size  Negative for acute infarct  Chronic microvascular ischemic changes in the white matter are mild. No cortical infarct. No brainstem lesion. Cerebellum intact  Negative for intracranial hemorrhage  12 mm calcified mass over the convexity on the left compatible with meningioma. No edema in the adjacent brain.  IMPRESSION: Atrophy and mild chronic microvascular ischemia.  No acute infarct  12 mm calcified meningioma over the convexity on the left.   Electronically Signed   By: Marlan Palau M.D.   On: 01/12/2015 10:02     Medications:     Scheduled Medications: . antiseptic oral rinse  7 mL Mouth Rinse BID  . apixaban  5 mg Oral BID  . bisoprolol  2.5 mg Oral Daily  .  colesevelam  1,875 mg Oral BID WC  . docusate sodium  100 mg Oral BID  . ferrous fumarate  1 tablet Oral Daily  . furosemide  80 mg Intravenous 3 times per day  . lactulose  40 g Oral TID  . levothyroxine  50 mcg Oral QAC breakfast  . metolazone  5 mg Oral BID  . multivitamin  2 tablet Oral Daily  . pantoprazole  40 mg Oral BID  . potassium chloride  40 mEq Oral BID  . sodium chloride  3 mL Intravenous Q12H    Infusions:    PRN Medications: sodium chloride, acetaminophen, gi cocktail, ondansetron (ZOFRAN) IV, oxyCODONE, phenazopyridine, sodium chloride   Assessment:   1. Acute on chronic diastolic HF R>>L with anasarca 2. Acute on chronic RV failure with acute cor pulmonale 3. Anasarca 4. AoV disease s/p bioprosthetic AVR 5. Morbid obesity 6. Chronic AF 7. Hypoalbuminemia- Albumin 2.7 8. Somnolence - hepatic encephalopathy  Plan/Discussion:    She has severe RHF with massive volume overload. Urine output picking up. Co-ox good. CVP still up. Continue lasix and metolazone. No need for milrinone yet. RHC prior to d/c - once diuresed.  Continue apixaban for AF  MS improving with lactulose. Brain MRI ok.   Bensimhon, Daniel,MD 1:15 PM

## 2015-01-13 NOTE — Progress Notes (Signed)
Physical Therapy Treatment Patient Details Name: Melanie Camacho MRN: [id number] DOB: [birth year] Today's Date: 01/13/2015    History of Present Illness Pt is an 81 y./o female recently admitted to similar s/s of CHF, now back from SNF rehab with CHF.    PT Comments    Pt admitted with above diagnosis. Pt currently with functional limitations due to balance and endurance deficits. Pt moves very slowly needing incr time to complete all activities. Still needed +2 assist for mobility with mod asssit neeeded to power up from raised bed.  Pt progressing with bettter stand pivot transfer today but still unable to take steps to ambulate.  Pt will benefit from skilled PT to increase their independence and safety with mobility to allow discharge to the venue listed below.    Follow Up Recommendations  SNF     Equipment Recommendations  None recommended by PT    Recommendations for Other Services       Precautions / Restrictions Precautions Precautions: Fall Precaution Comments: very fearful of falling Restrictions Weight Bearing Restrictions: No    Mobility  Bed Mobility Overal bed mobility: Needs Assistance;+2 for physical assistance Bed Mobility: Rolling;Sidelying to Sit Rolling: Mod assist;+2 for physical assistance Sidelying to sit: +2 for physical assistance;Max assist Supine to sit: Max assist     General bed mobility comments: needed truncal and LE assist with use of bed pad to assist.  Pt with full body edema causing bed mobility to be difficult.    Transfers Overall transfer level: Needs assistance Equipment used: Rolling walker (2 wheeled) Transfers: Sit to/from Stand Sit to Stand: Mod assist;Max assist;+2 physical assistance;From elevated surface Stand pivot transfers: Mod assist;+2 physical assistance;Min assist       General transfer comment: Assist for anterior lean and to power up.  did better with use of counting with use of momentum.  Took incr time for pt to  reach full stand.  Once up, pt needed mod cues to move LEs but with incr time pt was able to weight shift and take pivotal steps to chair without much physical assist. Pt was able to perform much better transfer today.  Pt was able to reach back and control descent into chair.    Ambulation/Gait                 Stairs            Wheelchair Mobility    Modified Rankin (Stroke Patients Only)       Balance Overall balance assessment: Needs assistance;History of Falls Sitting-balance support: Bilateral upper extremity supported;Feet supported Sitting balance-Leahy Scale: Poor Sitting balance - Comments: Needed assist to sit EOB as she was leaning all directions. Postural control: Posterior lean;Right lateral lean;Left lateral lean Standing balance support: Bilateral upper extremity supported;During functional activity Standing balance-Leahy Scale: Poor Standing balance comment: pt required UE support for balance in in standing using RW.  Also needed +2 for safety with pt with great fear of falling.                      Cognition Arousal/Alertness: Awake/alert Behavior During Therapy: Flat affect Overall Cognitive Status: Within Functional Limits for tasks assessed                      Exercises General Exercises - Lower Extremity Ankle Circles/Pumps: AROM;Both;10 reps;Supine Heel Slides: AROM;Both;10 reps;Supine    General Comments        Pertinent Vitals/Pain Pain Assessment:  Faces Faces Pain Scale: Hurts little more Pain Location: all over Pain Descriptors / Indicators: Aching;Grimacing Pain Intervention(s): Limited activity within patient's tolerance;Monitored during session;Repositioned  VSS on 3LO2 with sats >90%.      Home Living                      Prior Function            PT Goals (current goals can now be found in the care plan section) Acute Rehab PT Goals Patient Stated Goal: get stronger Progress towards PT goals:  Progressing toward goals    Frequency  Min 2X/week    PT Plan Current plan remains appropriate    Co-evaluation             End of Session Equipment Utilized During Treatment: Gait belt;Oxygen Activity Tolerance: Patient limited by fatigue Patient left: in chair;with call bell/phone within reach     Time: 1026-1107 PT Time Calculation (min) (ACUTE ONLY): 41 min  Charges:  $Therapeutic Exercise: 8-22 mins $Therapeutic Activity: 23-37 mins                    G CodesBerline Lopes 2015/01/16, 12:03 PM Laruth Hanger,PT Acute Rehabilitation [phone] (pager)

## 2015-01-13 NOTE — Progress Notes (Signed)
[facility]                                                                              Patient Demographics  Melanie Camacho, is a 81 y.o. female, DOB - 08/10/1932, YTK:[id number]  Admit date - 01/05/2015   Admitting Physician Lorretta Harp, MD  Outpatient Primary MD for the patient is PROVIDER NOT IN SYSTEM  LOS - 8   Chief Complaint  Patient presents with  . Shortness of Breath       Brief HPI   Patient is a 81 year old female with hypertension, diabetes, diet controlled, DM, CAD, AVR, stage 3 CKD, afib on eliquis, presented on 4/28 for abdominal pain and shortness of breath.  She was evaluated with a CT abdomen and pelvis , which did not reveal any acute intra abdominal findings. There was severe anasarca with moderate right and small left pleural effusions. Patient was admitted for acute on chronic diastolic heart failure and started on IV lasix. From 4/30 and 5/1, patient was hypotensive, tachycardic and her lasix and BB was held. Cardiology and heart failure team were consulted for recommendations.  Patient was noted to be lethargic on 5/3, CT head was negative for acute intracranial pathology, ABG did not show acute hypercapnia, PCO2 of 50, ammonia level was elevated at 91 and started on lactulose   Assessment & Plan    Principal Problem:  Acute on chronic diastolic CHF exacerbation with anasarca, acute on chronic RV failure/cor pulmonale: - Patient still has significant anasarca and volume overloaded, heart failure team following, on Lasix and metolazone, may need milrinone. - Negative balance of 7.3 L, weight down from 254-> 243lbs today   Atrial fibrillation; Rate controlled, continue eliquis  Acute encephalopathy: So much better today, alert and oriented, even discussed politics, may have been medication effect along with hepatic encephalopathy, hypothyroidism - CT head is negative, MRI of the brain this morning showed atrophy and mild chronic  microvascular ischemia, no acute infarct - Discontinued Neurontin, Requip - Repeat ammonia level down to 51, continue on lactulose, 4-5 BMs in a day, check ABG  - TSH 15.71, increased Synthroid to 50 MCG daily   Hypothyroidism: Continue Synthroid, increased to 50 MCG daily, TSH was 7.7 on 4/8, now 15.717.  Restless leg syndrome: Discontinued Requip and Neurontin due to mental status changes  Mild macrocytic anemia; Stable continue to monitor.   Hypotension: Improved. unclear etiology, afebrile, no leukocytosis - UA is negative for infection. CXR on 4/30 did not show any consolidation.  Stage 3 CKD;  - aapears at baseline  Code Status: full code  Family Communication: No family member at bed side. All the imagings, test results discussed with the patient today   Disposition Plan: remains in stepdown  Time Spent in minutes  25 minutes  Procedures  None  Consults    cardiology  DVT Prophylaxis  eliquis  Medications  Scheduled Meds: . antiseptic oral rinse  7 mL Mouth Rinse BID  . apixaban  5 mg Oral BID  . bisoprolol  2.5 mg Oral Daily  . colesevelam  1,875 mg Oral BID WC  . docusate sodium  100  mg Oral BID  . ferrous fumarate  1 tablet Oral Daily  . furosemide  80 mg Intravenous 3 times per day  . lactulose  40 g Oral TID  . levothyroxine  50 mcg Oral QAC breakfast  . metolazone  5 mg Oral BID  . multivitamin  2 tablet Oral Daily  . pantoprazole  40 mg Oral BID  . potassium chloride  40 mEq Oral BID  . sodium chloride  3 mL Intravenous Q12H   Continuous Infusions:  PRN Meds:.sodium chloride, acetaminophen, gi cocktail, ondansetron (ZOFRAN) IV, oxyCODONE, phenazopyridine, sodium chloride   Antibiotics   Anti-infectives    None        Subjective:   Melanie Camacho was seen and examined today. Alert and oriented 3, talked about presidential candidates. Feels a whole lot better today although still significant anasarca. Denies any chest pain, shortness  of breath, nausea, vomiting, abdominal pain (feels bloated but no pain). No new weakness, numbness or tingling.   Objective:   Blood pressure 116/90, pulse 109, temperature 97.5 F (36.4 C), temperature source Oral, resp. rate 24, height 5\' 2"  (1.575 m), weight 110.3 kg (243 lb 2.7 oz), SpO2 100 %.  Wt Readings from Last 3 Encounters:  01/13/15 110.3 kg (243 lb 2.7 oz)  12/30/14 104.599 kg (230 lb 9.6 oz)  12/29/13 87.7 kg (193 lb 5.5 oz)     Intake/Output Summary (Last 24 hours) at 01/13/15 1030 Last data filed at 01/13/15 0944  Gross per 24 hour  Intake    126 ml  Output   3400 ml  Net  -3274 ml    Exam  Gen: Alert and oriented 3  HEENT:  PERRLA, EOMI,  Neck: Supple,JVD +, no masses  CVS:  irregularly irregular  Resp:  Bibasilar crackles, no rhonchi or wheezing  Abdomen:  anasarca, Soft, NT, + bowel sounds  Ext: no cyanosis clubbing, 3+ edema  Neuro: cranial nerves II-12 intact, strength 5/5 in upper and lower extremities bilaterally, no focal neurological deficits  Skin: No rashes  Psych:   Alert and oriented 3, normal affect   Data Review   Micro Results Recent Results (from the past 240 hour(s))  Culture, Urine     Status: None   Collection Time: 01/07/15 12:30 AM  Result Value Ref Range Status   Specimen Description URINE, CATHETERIZED  Final   Special Requests n/a Immunocompromised  Final   Colony Count NO GROWTH Performed at [facility]   Final   Culture NO GROWTH Performed at [facility]   Final   Report Status 01/08/2015 FINAL  Final  Clostridium Difficile by PCR     Status: None   Collection Time: 01/12/15  4:44 PM  Result Value Ref Range Status   C difficile by pcr NEGATIVE NEGATIVE Final    Radiology Reports Ct Abdomen Pelvis Wo Contrast  01/06/2015   CLINICAL DATA:  Intermittent right lower quadrant pain for several days.  EXAM: CT ABDOMEN AND PELVIS WITHOUT CONTRAST  TECHNIQUE: Multidetector CT imaging of the  abdomen and pelvis was performed following the standard protocol without IV contrast.  COMPARISON:  None.  FINDINGS: BODY WALL: Severe anasarca. The abdominal wall is incompletely imaged but there is no gross fluid collection. Lax abdominal wall with wide necked herniation below the umbilicus, containing nonobstructed small bowel. Muscular or fascia calcification in the abdominal wall which could be dystrophic or from previous surgery.  LOWER CHEST: Moderate right and small left pleural effusions with multi segment  atelectasis.  Aortic valve replacement. There is mild cardiomegaly and extensive coronary atherosclerosis.  ABDOMEN/PELVIS:  Liver: Large liver, with the right lobe projecting to the iliac crest. Rounded masses in the right liver are near water density, excluding a 2 cm lesion in segment 6 which was likely not visualized/evaluated on sonography from 8 days ago.  Biliary: Cholecystectomy. There is suggestion of a choledochoenterostomy, although there is no pneumobilia.  Pancreas: Unremarkable.  Spleen: Unremarkable.  Adrenals: Unremarkable.  Kidneys and ureters: No hydronephrosis or stone.  Bladder: Decompressed by a Foley catheter.  Reproductive: Unremarkable for age.  Bowel: Distal gastrectomy. The anastomoses are patent and there is no surrounding inflammatory change. No pericecal inflammation. No bowel obstruction.  Retroperitoneum: No mass or adenopathy.  Peritoneum: Small perihepatic ascites, likely from volume overload.  Vascular: No acute abnormality.  Diffuse arterial calcification.  OSSEOUS: No acute abnormalities.  IMPRESSION: 1. No acute intra-abdominal findings. 2. Severe anasarca. Moderate right and small left pleural effusions. 3. A 2 cm lesion in segment 6 is indeterminate due to density. Other liver lesions appear cystic. 4. Ventral abdominal hernia containing nonobstructed small bowel. 5. Distal gastrectomy.   Electronically Signed   By: Marnee Spring M.D.   On: 01/06/2015 10:36   Dg  Chest 2 View  01/05/2015   CLINICAL DATA:  Congestive heart failure shortness breath.  EXAM: CHEST  2 VIEW  COMPARISON:  Radiograph 12/27/2014  FINDINGS: Cardiac silhouette is enlarged. There is bilateral pleural effusions similar prior. There is mild central venous pulmonary congestion. Mild basilar atelectasis.  IMPRESSION: Bilateral pleural effusions and central venous pulmonary congestion. Findings similar to comparison exam.   Electronically Signed   By: Genevive Bi M.D.   On: 01/05/2015 17:11   Dg Chest 2 View  12/27/2014   CLINICAL DATA:  Chest pain for 1 day.  EXAM: CHEST  2 VIEW  COMPARISON:  12/20/2014.  FINDINGS: Stable enlarged cardiac silhouette. No significant change in bilateral pleural effusions. Post CABG changes are again demonstrated. Evidence of bilateral chronic rotator cuff tears. Left shoulder degenerative changes.  IMPRESSION: Stable cardiomegaly and bilateral pleural effusions.   Electronically Signed   By: Beckie Salts M.D.   On: 12/27/2014 15:47   Dg Chest 2 View  12/20/2014   CLINICAL DATA:  Congestive failure  EXAM: CHEST  2 VIEW  COMPARISON:  12/15/2014  FINDINGS: The cardiac shadow remains enlarged. A right-sided pleural effusion and right basilar atelectasis is again identified. Postsurgical changes are seen. The left lung shows a small effusion but stable. No acute bony abnormality is seen.  IMPRESSION: Bilateral effusions right greater than left. Right basilar atelectasis is noted as well.   Electronically Signed   By: Alcide Clever M.D.   On: 12/20/2014 12:16   Dg Chest 2 View  12/15/2014   CLINICAL DATA:  Shortness of breath, history of aortic valve replacement an acute and chronic CHF  EXAM: CHEST  2 VIEW  COMPARISON:  Portable chest x-ray of December 14, 2014  FINDINGS: The left lung is well-expanded. There is a small left pleural effusion. On the right there is a moderate-sized effusion with basilar atelectasis. The pulmonary interstitial markings remain mildly  increased. The cardiac silhouette remains enlarged. There is tortuosity of the descending thoracic aorta.  IMPRESSION: COPD with superimposed CHF. Slight interval improvement in the appearance of the pulmonary interstitium has occurred since yesterday's study. There is persistent pleural fluid and basilar atelectasis on the right.   Electronically Signed   By: Onalee Hua  [place]   On: 12/15/2014 08:07   Ct Head Wo Contrast  01/10/2015   CLINICAL DATA:  Drowsiness  EXAM: CT HEAD WITHOUT CONTRAST  TECHNIQUE: Contiguous axial images were obtained from the base of the skull through the vertex without intravenous contrast.  COMPARISON:  None.  FINDINGS: Bony calvarium is intact. Mild atrophic changes are seen. No findings to suggest acute hemorrhage, acute infarction or space-occupying mass lesion are identified.  IMPRESSION: Mild atrophic changes without acute abnormality.   Electronically Signed   By: Alcide Clever M.D.   On: 01/10/2015 12:35   Mr Brain Wo Contrast  01/12/2015   CLINICAL DATA:  Acute encephalopathy  EXAM: MRI HEAD WITHOUT CONTRAST  TECHNIQUE: Multiplanar, multiecho pulse sequences of the brain and surrounding structures were obtained without intravenous contrast.  COMPARISON:  CT head 01/10/2015  FINDINGS: Image quality degraded by mild to moderate motion.  Generalized atrophy. Negative for hydrocephalus. Pituitary normal in size  Negative for acute infarct  Chronic microvascular ischemic changes in the white matter are mild. No cortical infarct. No brainstem lesion. Cerebellum intact  Negative for intracranial hemorrhage  12 mm calcified mass over the convexity on the left compatible with meningioma. No edema in the adjacent brain.  IMPRESSION: Atrophy and mild chronic microvascular ischemia.  No acute infarct  12 mm calcified meningioma over the convexity on the left.   Electronically Signed   By: Marlan Palau M.D.   On: 01/12/2015 10:02   US Renal  12/29/2014   CLINICAL DATA:  Acute renal failure   EXAM: RENAL/URINARY TRACT ULTRASOUND COMPLETE  COMPARISON:  None.  FINDINGS: Right Kidney:  Length: 9.1 cm. Diffuse cortical thinning is noted. No focal mass lesion or hydronephrosis is noted.  Left Kidney:  Length: 10.2 cm. Echogenicity within normal limits. No mass or hydronephrosis visualized.  Bladder:  Appears normal for degree of bladder distention.  A 3.2 cm hepatic cyst is noted. Small bilateral pleural effusions are noted.  IMPRESSION: Mild cortical thinning in the right kidney.  No mass lesion or hydronephrosis is noted.  Small bilateral pleural effusions.  Hepatic cyst.   Electronically Signed   By: Alcide Clever M.D.   On: 12/29/2014 11:28   Ir Fluoro Guide Cv Line Right  01/10/2015   CLINICAL DATA:  Multiple comorbidities including atrial fibrillation, hypertension, CHF, morbid obesity, poor peripheral access  EXAM: POWER PICC LINE PLACEMENT WITH ULTRASOUND AND FLUOROSCOPIC GUIDANCE  FLUOROSCOPY TIME:  30 seconds, 9 mGy  PROCEDURE: The patient was advised of the possible risks andcomplications and agreed to undergo the procedure. The patient was then brought to the angiographic suite for the procedure.  The right arm was prepped with chlorhexidine, drapedin the usual sterile fashion using maximum barrier technique (cap and mask, sterile gown, sterile gloves, large sterile sheet, hand hygiene and cutaneous antisepsis) and infiltrated locally with 1% Lidocaine.  Ultrasound demonstrated patency of the right brachial vein, and this was documented with an image. Under real-time ultrasound guidance, this vein was accessed with a 21 gauge micropuncture needle and image documentation was performed. A 0.018 wire was introduced in to the vein. Over this, a 5 [place] double lumen power PICC was advanced to the lower SVC/right atrial junction. Fluoroscopy during the procedure and fluoro spot radiograph confirms appropriate catheter position. The catheter was flushed and covered with asterile dressing.  Catheter  length: 38  Complications: None  IMPRESSION: Successful right arm power PICC line placement with ultrasound and fluoroscopic guidance. The catheter is ready for use.  Electronically Signed   By: Judie Petit.  Shick M.D.   On: 01/10/2015 12:56   Ir US Guide Vasc Access Right  01/10/2015   CLINICAL DATA:  Multiple comorbidities including atrial fibrillation, hypertension, CHF, morbid obesity, poor peripheral access  EXAM: POWER PICC LINE PLACEMENT WITH ULTRASOUND AND FLUOROSCOPIC GUIDANCE  FLUOROSCOPY TIME:  30 seconds, 9 mGy  PROCEDURE: The patient was advised of the possible risks andcomplications and agreed to undergo the procedure. The patient was then brought to the angiographic suite for the procedure.  The right arm was prepped with chlorhexidine, drapedin the usual sterile fashion using maximum barrier technique (cap and mask, sterile gown, sterile gloves, large sterile sheet, hand hygiene and cutaneous antisepsis) and infiltrated locally with 1% Lidocaine.  Ultrasound demonstrated patency of the right brachial vein, and this was documented with an image. Under real-time ultrasound guidance, this vein was accessed with a 21 gauge micropuncture needle and image documentation was performed. A 0.018 wire was introduced in to the vein. Over this, a 5 [place] double lumen power PICC was advanced to the lower SVC/right atrial junction. Fluoroscopy during the procedure and fluoro spot radiograph confirms appropriate catheter position. The catheter was flushed and covered with asterile dressing.  Catheter length: 38  Complications: None  IMPRESSION: Successful right arm power PICC line placement with ultrasound and fluoroscopic guidance. The catheter is ready for use.   Electronically Signed   By: Judie Petit.  Shick M.D.   On: 01/10/2015 12:56   Dg Chest Port 1 View  01/07/2015   CLINICAL DATA:  Respiratory difficulty. Cough. Congestion. Cardiomegaly and CHF. No fever. No chest pain.  EXAM: PORTABLE CHEST - 1 VIEW  COMPARISON:   01/05/2015  FINDINGS: Heart is enlarged. There is moderate right pleural effusion associated right basilar atelectasis. Small left pleural effusion is present. There are changes of interstitial edema.  IMPRESSION: 1. Cardiomegaly and interstitial edema. 2. Persistent bibasilar opacity, right greater than left.   Electronically Signed   By: Norva Pavlov M.D.   On: 01/07/2015 18:02   Dg Chest Port 1 View  12/14/2014   CLINICAL DATA:  Shortness of Breath  EXAM: PORTABLE CHEST - 1 VIEW  COMPARISON:  None.  FINDINGS: There is cardiomegaly with pulmonary venous hypertension. There is a right pleural effusion with airspace consolidation in the right base. There is mild interstitial edema. No adenopathy. Bones are osteoporotic. Patient is status post median sternotomy.  IMPRESSION: Evidence of congestive heart failure. Cannot exclude superimposed pneumonia in the right base.   Electronically Signed   By: Bretta Bang III M.D.   On: 12/14/2014 14:37    CBC  Recent Labs Lab 01/08/15 0419 01/09/15 0305  WBC 4.4 3.7*  HGB 10.3* 10.3*  HCT 34.3* 34.2*  PLT 148* 146*  MCV 102.1* 103.0*  MCH 30.7 31.0  MCHC 30.0 30.1  RDW 16.3* 16.3*    Chemistries   Recent Labs Lab 01/09/15 0305 01/09/15 0910 01/10/15 0308 01/11/15 0630 01/12/15 0500 01/13/15 0500  NA 142  --  140 141 144 144  K 3.3*  --  4.2 3.7 3.6 3.6  CL 105  --  101 101 105 103  CO2 28  --  29 30 32 32  GLUCOSE 87  --  76 77 110* 105*  BUN 38*  --  38* 39* 35* 33*  CREATININE 1.29*  --  1.34* 1.33* 1.37* 1.26*  CALCIUM 8.7*  --  8.9 8.8* 9.2 9.3  MG  --  2.2  --   --   --   --  AST  --   --  31 26 25   --   ALT  --   --  14 15 14   --   ALKPHOS  --   --  68 68 75  --   BILITOT  --   --  0.8 0.8 0.8  --    ------------------------------------------------------------------------------------------------------------------ estimated creatinine clearance is 41.7 mL/min (by C-G formula based on Cr of  1.26). ------------------------------------------------------------------------------------------------------------------ No results for input(s): HGBA1C in the last 72 hours. ------------------------------------------------------------------------------------------------------------------ No results for input(s): CHOL, HDL, LDLCALC, TRIG, CHOLHDL, LDLDIRECT in the last 72 hours. ------------------------------------------------------------------------------------------------------------------  Recent Labs  01/11/15 1350  TSH 15.717*   ------------------------------------------------------------------------------------------------------------------ No results for input(s): VITAMINB12, FOLATE, FERRITIN, TIBC, IRON, RETICCTPCT in the last 72 hours.  Coagulation profile No results for input(s): INR, PROTIME in the last 168 hours.  No results for input(s): DDIMER in the last 72 hours.  Cardiac Enzymes  Recent Labs Lab 01/09/15 0305 01/09/15 0910 01/09/15 1503  TROPONINI 0.03 0.03 0.03   ------------------------------------------------------------------------------------------------------------------ Invalid input(s): POCBNP   Recent Labs  01/11/15 2200 01/12/15 0759 01/12/15 1156 01/12/15 1600 01/12/15 2058 01/13/15 0826  GLUCAP 122* 94 107* 106* 115* 82     Jamaiyah Pyle M.D. [facility] 01/13/2015, 10:30 AM  Pager: [phone]   Between 7am to 7pm - call Pager - [phone]  After 7pm go to [email or web address] - password [email or web address]  Call night coverage person covering after 7pm

## 2015-01-14 LAB — BASIC METABOLIC PANEL
ANION GAP: 8 (ref 5–15)
BUN: 30 mg/dL — ABNORMAL HIGH (ref 6–20)
CHLORIDE: 100 mmol/L — AB (ref 101–111)
CO2: 33 mmol/L — ABNORMAL HIGH (ref 22–32)
Calcium: 9.2 mg/dL (ref 8.9–10.3)
Creatinine, Ser: 1.14 mg/dL — ABNORMAL HIGH (ref 0.44–1.00)
GFR calc Af Amer: 51 mL/min — ABNORMAL LOW (ref >60)
GFR calc non Af Amer: 44 mL/min — ABNORMAL LOW (ref >60)
Glucose, Bld: 95 mg/dL (ref 70–99)
POTASSIUM: 3.5 mmol/L (ref 3.5–5.1)
Sodium: 141 mmol/L (ref 135–145)

## 2015-01-14 LAB — GLUCOSE, CAPILLARY
GLUCOSE-CAPILLARY: 84 mg/dL (ref 70–99)
GLUCOSE-CAPILLARY: 102 mg/dL — AB (ref 70–99)
GLUCOSE-CAPILLARY: 94 mg/dL (ref 70–99)
Glucose-Capillary: 111 mg/dL — ABNORMAL HIGH (ref 70–99)
Glucose-Capillary: 105 mg/dL — ABNORMAL HIGH (ref 70–99)

## 2015-01-14 LAB — CARBOXYHEMOGLOBIN
Carboxyhemoglobin: 1.8 % — ABNORMAL HIGH (ref 0.5–1.5)
METHEMOGLOBIN: 1.2 % (ref 0.0–1.5)
O2 Saturation: 66.5 %
TOTAL HEMOGLOBIN: 10.1 g/dL — AB (ref 12.0–16.0)

## 2015-01-14 NOTE — Progress Notes (Signed)
Primary cardiologist: Dr. Nicki Guadalajara  Seen for followup: Diastolic heart failure with RV dysfunction, atrial fibrillation  Subjective:    Complains of leg pain, decreased appetite. No palpitations or chest pain.  Objective:   Temp:  [96.9 F (36.1 C)-97.6 F (36.4 C)] 97.5 F (36.4 C) (05/07 0800) Pulse Rate:  [41-119] 86 (05/07 0800) Resp:  [5-31] 16 (05/07 0800) BP: (90-114)/(48-95) 90/61 mmHg (05/07 0800) SpO2:  [91 %-99 %] 97 % (05/07 0800) Weight:  [231 lb (104.781 kg)] 231 lb (104.781 kg) (05/07 0400) Last BM Date: 01/13/15  Filed Weights   01/12/15 0500 01/13/15 0500 01/14/15 0400  Weight: 236 lb 8.9 oz (107.3 kg) 243 lb 2.7 oz (110.3 kg) 231 lb (104.781 kg)    Intake/Output Summary (Last 24 hours) at 01/14/15 1049 Last data filed at 01/14/15 1000  Gross per 24 hour  Intake    480 ml  Output   3425 ml  Net  -2945 ml    Telemetry: Atrial fibrillation.  Exam:  General: Obese woman, no distress.  Lungs: Crackles toward the bases.  Cardiac: Elevated JVP, irregularly irregular.  Abdomen: Protuberant.  Extremities: Lower legs wrapped, chronic edema present up to the thighs.  Lab Results:  Basic Metabolic Panel:  Recent Labs Lab 01/09/15 0910  01/12/15 0500 01/13/15 0500 01/14/15 0501  NA  --   < > 144 144 141  K  --   < > 3.6 3.6 3.5  CL  --   < > 105 103 100*  CO2  --   < > 32 32 33*  GLUCOSE  --   < > 110* 105* 95  BUN  --   < > 35* 33* 30*  CREATININE  --   < > 1.37* 1.26* 1.14*  CALCIUM  --   < > 9.2 9.3 9.2  MG 2.2  --   --   --   --   < > = values in this interval not displayed.  Liver Function Tests:  Recent Labs Lab 01/10/15 0308 01/11/15 0630 01/12/15 0500  AST ALT ALKPHOS 68 68 75  BILITOT 0.8 0.8 0.8  PROT 6.4* 6.6 6.9  ALBUMIN 2.7* 2.6* 2.8*    CBC:  Recent Labs Lab 01/08/15 0419 01/09/15 0305  WBC 4.4 3.7*  HGB 10.3* 10.3*  HCT 34.3* 34.2*  MCV 102.1* 103.0*  PLT 148* 146*     Cardiac Enzymes:  Recent Labs Lab 01/09/15 0305 01/09/15 0910 01/09/15 1503  TROPONINI 0.03 0.03 0.03    Limited echocardiogram 01/11/2015: Study Conclusions  - Left ventricle: The cavity size was normal. There was mild concentric hypertrophy. Systolic function was mildly reduced. The estimated ejection fraction was in the range of 45% to 50%. Possible hypokinesis of the entireinferolateral myocardium. - Aortic valve: The AV is not visualized adequately to comment on. There was mild regurgitation. - Mitral valve: Calcified annulus. Moderate thickening. There was mild regurgitation. - Right ventricle: RV was not measured by appears dilated. Systolic function was moderately to severely reduced. - Pulmonary arteries: PA peak pressure: 46 mm Hg (S). - Pericardium, extracardiac: There was a left pleural effusion.  Impressions:  - The right ventricular systolic pressure was increased consistent with moderate pulmonary hypertension.    Medications:   Scheduled Medications: . antiseptic oral rinse  7 mL Mouth Rinse BID  . apixaban  5 mg Oral BID  . bisoprolol  2.5 mg Oral Daily  . colesevelam  1,875 mg Oral BID WC  . docusate sodium  100 mg Oral BID  . ferrous fumarate  1 tablet Oral Daily  . furosemide  80 mg Intravenous 3 times per day  . lactulose  40 g Oral TID  . levothyroxine  50 mcg Oral QAC breakfast  . metolazone  5 mg Oral BID  . multivitamin  2 tablet Oral Daily  . pantoprazole  40 mg Oral BID  . potassium chloride  40 mEq Oral BID  . sodium chloride  3 mL Intravenous Q12H      PRN Medications:  sodium chloride, acetaminophen, gi cocktail, ondansetron (ZOFRAN) IV, oxyCODONE, phenazopyridine, sodium chloride   Assessment:   1. Acute on chronic diastolic HF R>>L with anasarca. Urine output has increased substantially on IV Lasix and metolazone, 4 L out yesterday.  2. Acute on chronic RV failure with acute cor pulmonale. PASP 46 mmHg by  echocardiogram.  3. AoV disease s/p bioprosthetic AVR.  4. Morbid obesity.  5. Chronic AF.  6. Hypoalbuminemia- Albumin 2.7.   Plan/Discussion:    Discussed with nursing, will have wound care check on her leg wraps, perhaps re-dress and loosen somewhat with associated pain. Continue IV Lasix and metolazone with excellent diuresis. As per Dr. Prescott Gum note, ultimately will need right heart catheterization once volume status further optimized.   Jonelle Sidle, M.D., F.A.C.C.

## 2015-01-14 NOTE — Progress Notes (Signed)
[facility]                                                                              Patient Demographics  Melanie Camacho, is a 81 y.o. female, DOB - July 08, 1932, ZOX:[id number]  Admit date - 01/05/2015   Admitting Physician Lorretta Harp, MD  Outpatient Primary MD for the patient is PROVIDER NOT IN SYSTEM  LOS - 9   Chief Complaint  Patient presents with  . Shortness of Breath       Brief HPI   Patient is a 81 year old female with hypertension, diabetes, diet controlled, DM, CAD, AVR, stage 3 CKD, afib on eliquis, presented on 4/28 for abdominal pain and shortness of breath.  She was evaluated with a CT abdomen and pelvis , which did not reveal any acute intra abdominal findings. There was severe anasarca with moderate right and small left pleural effusions. Patient was admitted for acute on chronic diastolic heart failure and started on IV lasix. From 4/30 and 5/1, patient was hypotensive, tachycardic and her lasix and BB was held. Cardiology and heart failure team were consulted for recommendations.  Patient was noted to be lethargic on 5/3, CT head was negative for acute intracranial pathology, ABG did not show acute hypercapnia, PCO2 of 50, ammonia level was elevated at 91 and started on lactulose 5/5 patient continued to be somnolent, MRI of the brain was obtained which was negative for acute infarct.. Medications were adjusted, discontinued Neurontin and Requip. Lactulose was increased 5/6, 5,7: Patient's mental status continues to improve, still very deconditioned, anasarca with fluid overload    Assessment & Plan    Principal Problem:  Acute on chronic diastolic CHF exacerbation with anasarca, acute on chronic RV failure/cor pulmonale: - significant anasarca and volume overloaded, CHF team following, on Lasix and metolazone, may need milrinone? - Has picked up a good diuresis, negative balance of 11Liters, weight down from 254-> 231lbs today   Atrial  fibrillation; Rate controlled, continue eliquis  Acute encephalopathy:  improved, alert and oriented 3,  may have been medication effect along with hepatic encephalopathy, hypothyroidism - CT head is negative, MRI of the brain  5/5 negative for acute CVA, atrophia and mild chronic vascular ischemia  - Discontinued Neurontin, Requip -  continue lactulose, has rectal tube. No need to check ammonia levels if mental status is improving.  - TSH 15.71, increased Synthroid to 50 MCG daily   Hypothyroidism: Continue Synthroid, increased to 50 MCG daily, TSH was 7.7 on 4/8, now 15.71  Restless leg syndrome: Discontinued Requip and Neurontin due to mental status changes  Mild macrocytic anemia; Stable continue to monitor.   Hypotension: Improved. unclear etiology, afebrile, no leukocytosis - UA is negative for infection. CXR on 4/30 did not show any consolidation.  Stage 3 CKD;  - aapears at baseline  Code Status: full code  Family Communication: No family member at bed side. All the imagings, test results discussed with the patient today   Disposition Plan: Transfer to telemetry if okay with cardiology?   Time Spent in minutes  25 minutes  Procedures  None  Consults    cardiology  DVT  Prophylaxis  eliquis  Medications  Scheduled Meds: . antiseptic oral rinse  7 mL Mouth Rinse BID  . apixaban  5 mg Oral BID  . bisoprolol  2.5 mg Oral Daily  . colesevelam  1,875 mg Oral BID WC  . docusate sodium  100 mg Oral BID  . ferrous fumarate  1 tablet Oral Daily  . furosemide  80 mg Intravenous 3 times per day  . lactulose  40 g Oral TID  . levothyroxine  50 mcg Oral QAC breakfast  . metolazone  5 mg Oral BID  . multivitamin  2 tablet Oral Daily  . pantoprazole  40 mg Oral BID  . potassium chloride  40 mEq Oral BID  . sodium chloride  3 mL Intravenous Q12H   Continuous Infusions:  PRN Meds:.sodium chloride, acetaminophen, gi cocktail, ondansetron (ZOFRAN) IV, oxyCODONE,  phenazopyridine, sodium chloride   Antibiotics   Anti-infectives    None        Subjective:   Lianne Carreto was seen and examined today. Alert and oriented 3,  denies any chest pain, shortness of breath, nausea, vomiting or any new weakness. Still has significant fluid overload with anasarca. No acute events overnight, BP on softer side   Objective:   Blood pressure 90/61, pulse 86, temperature 97.5 F (36.4 C), temperature source Oral, resp. rate 16, height 5\' 2"  (1.575 m), weight 104.781 kg (231 lb), SpO2 97 %.  Wt Readings from Last 3 Encounters:  01/14/15 104.781 kg (231 lb)  12/30/14 104.599 kg (230 lb 9.6 oz)  12/29/13 87.7 kg (193 lb 5.5 oz)     Intake/Output Summary (Last 24 hours) at 01/14/15 0927 Last data filed at 01/14/15 0700  Gross per 24 hour  Intake    303 ml  Output   3975 ml  Net  -3672 ml    Exam  Gen: Alert and oriented 3  HEENT:  PERRLA, EOMI,  Neck: Supple,JVD +, no masses  CVS:  irregularly irregular  Resp: Decreased breath sounds at the bases   Abdomen: Morbidly obese,  anasarca, Soft, NT, + bowel sounds  Ext: no cyanosis clubbing, 3+ edema  Neuro:  no new focal neurological deficits  Skin: No rashes  Psych:   Alert and oriented 3, normal affect   Data Review   Micro Results Recent Results (from the past 240 hour(s))  Culture, Urine     Status: None   Collection Time: 01/07/15 12:30 AM  Result Value Ref Range Status   Specimen Description URINE, CATHETERIZED  Final   Special Requests n/a Immunocompromised  Final   Colony Count NO GROWTH Performed at [facility]   Final   Culture NO GROWTH Performed at [facility]   Final   Report Status 01/08/2015 FINAL  Final  Clostridium Difficile by PCR     Status: None   Collection Time: 01/12/15  4:44 PM  Result Value Ref Range Status   C difficile by pcr NEGATIVE NEGATIVE Final    Radiology Reports Ct Abdomen Pelvis Wo Contrast  01/06/2015   CLINICAL  DATA:  Intermittent right lower quadrant pain for several days.  EXAM: CT ABDOMEN AND PELVIS WITHOUT CONTRAST  TECHNIQUE: Multidetector CT imaging of the abdomen and pelvis was performed following the standard protocol without IV contrast.  COMPARISON:  None.  FINDINGS: BODY WALL: Severe anasarca. The abdominal wall is incompletely imaged but there is no gross fluid collection. Lax abdominal wall with wide necked herniation below the umbilicus, containing nonobstructed small  bowel. Muscular or fascia calcification in the abdominal wall which could be dystrophic or from previous surgery.  LOWER CHEST: Moderate right and small left pleural effusions with multi segment atelectasis.  Aortic valve replacement. There is mild cardiomegaly and extensive coronary atherosclerosis.  ABDOMEN/PELVIS:  Liver: Large liver, with the right lobe projecting to the iliac crest. Rounded masses in the right liver are near water density, excluding a 2 cm lesion in segment 6 which was likely not visualized/evaluated on sonography from 8 days ago.  Biliary: Cholecystectomy. There is suggestion of a choledochoenterostomy, although there is no pneumobilia.  Pancreas: Unremarkable.  Spleen: Unremarkable.  Adrenals: Unremarkable.  Kidneys and ureters: No hydronephrosis or stone.  Bladder: Decompressed by a Foley catheter.  Reproductive: Unremarkable for age.  Bowel: Distal gastrectomy. The anastomoses are patent and there is no surrounding inflammatory change. No pericecal inflammation. No bowel obstruction.  Retroperitoneum: No mass or adenopathy.  Peritoneum: Small perihepatic ascites, likely from volume overload.  Vascular: No acute abnormality.  Diffuse arterial calcification.  OSSEOUS: No acute abnormalities.  IMPRESSION: 1. No acute intra-abdominal findings. 2. Severe anasarca. Moderate right and small left pleural effusions. 3. A 2 cm lesion in segment 6 is indeterminate due to density. Other liver lesions appear cystic. 4. Ventral  abdominal hernia containing nonobstructed small bowel. 5. Distal gastrectomy.   Electronically Signed   By: Marnee Spring M.D.   On: 01/06/2015 10:36   Dg Chest 2 View  01/05/2015   CLINICAL DATA:  Congestive heart failure shortness breath.  EXAM: CHEST  2 VIEW  COMPARISON:  Radiograph 12/27/2014  FINDINGS: Cardiac silhouette is enlarged. There is bilateral pleural effusions similar prior. There is mild central venous pulmonary congestion. Mild basilar atelectasis.  IMPRESSION: Bilateral pleural effusions and central venous pulmonary congestion. Findings similar to comparison exam.   Electronically Signed   By: Genevive Bi M.D.   On: 01/05/2015 17:11   Dg Chest 2 View  12/27/2014   CLINICAL DATA:  Chest pain for 1 day.  EXAM: CHEST  2 VIEW  COMPARISON:  12/20/2014.  FINDINGS: Stable enlarged cardiac silhouette. No significant change in bilateral pleural effusions. Post CABG changes are again demonstrated. Evidence of bilateral chronic rotator cuff tears. Left shoulder degenerative changes.  IMPRESSION: Stable cardiomegaly and bilateral pleural effusions.   Electronically Signed   By: Beckie Salts M.D.   On: 12/27/2014 15:47   Dg Chest 2 View  12/20/2014   CLINICAL DATA:  Congestive failure  EXAM: CHEST  2 VIEW  COMPARISON:  12/15/2014  FINDINGS: The cardiac shadow remains enlarged. A right-sided pleural effusion and right basilar atelectasis is again identified. Postsurgical changes are seen. The left lung shows a small effusion but stable. No acute bony abnormality is seen.  IMPRESSION: Bilateral effusions right greater than left. Right basilar atelectasis is noted as well.   Electronically Signed   By: Alcide Clever M.D.   On: 12/20/2014 12:16   Ct Head Wo Contrast  01/10/2015   CLINICAL DATA:  Drowsiness  EXAM: CT HEAD WITHOUT CONTRAST  TECHNIQUE: Contiguous axial images were obtained from the base of the skull through the vertex without intravenous contrast.  COMPARISON:  None.  FINDINGS: Bony  calvarium is intact. Mild atrophic changes are seen. No findings to suggest acute hemorrhage, acute infarction or space-occupying mass lesion are identified.  IMPRESSION: Mild atrophic changes without acute abnormality.   Electronically Signed   By: Alcide Clever M.D.   On: 01/10/2015 12:35   Mr Brain Wo Contrast  01/12/2015   CLINICAL DATA:  Acute encephalopathy  EXAM: MRI HEAD WITHOUT CONTRAST  TECHNIQUE: Multiplanar, multiecho pulse sequences of the brain and surrounding structures were obtained without intravenous contrast.  COMPARISON:  CT head 01/10/2015  FINDINGS: Image quality degraded by mild to moderate motion.  Generalized atrophy. Negative for hydrocephalus. Pituitary normal in size  Negative for acute infarct  Chronic microvascular ischemic changes in the white matter are mild. No cortical infarct. No brainstem lesion. Cerebellum intact  Negative for intracranial hemorrhage  12 mm calcified mass over the convexity on the left compatible with meningioma. No edema in the adjacent brain.  IMPRESSION: Atrophy and mild chronic microvascular ischemia.  No acute infarct  12 mm calcified meningioma over the convexity on the left.   Electronically Signed   By: Marlan Palau M.D.   On: 01/12/2015 10:02   US Renal  12/29/2014   CLINICAL DATA:  Acute renal failure  EXAM: RENAL/URINARY TRACT ULTRASOUND COMPLETE  COMPARISON:  None.  FINDINGS: Right Kidney:  Length: 9.1 cm. Diffuse cortical thinning is noted. No focal mass lesion or hydronephrosis is noted.  Left Kidney:  Length: 10.2 cm. Echogenicity within normal limits. No mass or hydronephrosis visualized.  Bladder:  Appears normal for degree of bladder distention.  A 3.2 cm hepatic cyst is noted. Small bilateral pleural effusions are noted.  IMPRESSION: Mild cortical thinning in the right kidney.  No mass lesion or hydronephrosis is noted.  Small bilateral pleural effusions.  Hepatic cyst.   Electronically Signed   By: Alcide Clever M.D.   On: 12/29/2014  11:28   Ir Fluoro Guide Cv Line Right  01/10/2015   CLINICAL DATA:  Multiple comorbidities including atrial fibrillation, hypertension, CHF, morbid obesity, poor peripheral access  EXAM: POWER PICC LINE PLACEMENT WITH ULTRASOUND AND FLUOROSCOPIC GUIDANCE  FLUOROSCOPY TIME:  30 seconds, 9 mGy  PROCEDURE: The patient was advised of the possible risks andcomplications and agreed to undergo the procedure. The patient was then brought to the angiographic suite for the procedure.  The right arm was prepped with chlorhexidine, drapedin the usual sterile fashion using maximum barrier technique (cap and mask, sterile gown, sterile gloves, large sterile sheet, hand hygiene and cutaneous antisepsis) and infiltrated locally with 1% Lidocaine.  Ultrasound demonstrated patency of the right brachial vein, and this was documented with an image. Under real-time ultrasound guidance, this vein was accessed with a 21 gauge micropuncture needle and image documentation was performed. A 0.018 wire was introduced in to the vein. Over this, a 5 [place] double lumen power PICC was advanced to the lower SVC/right atrial junction. Fluoroscopy during the procedure and fluoro spot radiograph confirms appropriate catheter position. The catheter was flushed and covered with asterile dressing.  Catheter length: 38  Complications: None  IMPRESSION: Successful right arm power PICC line placement with ultrasound and fluoroscopic guidance. The catheter is ready for use.   Electronically Signed   By: Judie Petit.  Shick M.D.   On: 01/10/2015 12:56   Ir US Guide Vasc Access Right  01/10/2015   CLINICAL DATA:  Multiple comorbidities including atrial fibrillation, hypertension, CHF, morbid obesity, poor peripheral access  EXAM: POWER PICC LINE PLACEMENT WITH ULTRASOUND AND FLUOROSCOPIC GUIDANCE  FLUOROSCOPY TIME:  30 seconds, 9 mGy  PROCEDURE: The patient was advised of the possible risks andcomplications and agreed to undergo the procedure. The patient was then  brought to the angiographic suite for the procedure.  The right arm was prepped with chlorhexidine, drapedin the usual sterile fashion using  maximum barrier technique (cap and mask, sterile gown, sterile gloves, large sterile sheet, hand hygiene and cutaneous antisepsis) and infiltrated locally with 1% Lidocaine.  Ultrasound demonstrated patency of the right brachial vein, and this was documented with an image. Under real-time ultrasound guidance, this vein was accessed with a 21 gauge micropuncture needle and image documentation was performed. A 0.018 wire was introduced in to the vein. Over this, a 5 [place] double lumen power PICC was advanced to the lower SVC/right atrial junction. Fluoroscopy during the procedure and fluoro spot radiograph confirms appropriate catheter position. The catheter was flushed and covered with asterile dressing.  Catheter length: 38  Complications: None  IMPRESSION: Successful right arm power PICC line placement with ultrasound and fluoroscopic guidance. The catheter is ready for use.   Electronically Signed   By: Judie Petit.  Shick M.D.   On: 01/10/2015 12:56   Dg Chest Port 1 View  01/07/2015   CLINICAL DATA:  Respiratory difficulty. Cough. Congestion. Cardiomegaly and CHF. No fever. No chest pain.  EXAM: PORTABLE CHEST - 1 VIEW  COMPARISON:  01/05/2015  FINDINGS: Heart is enlarged. There is moderate right pleural effusion associated right basilar atelectasis. Small left pleural effusion is present. There are changes of interstitial edema.  IMPRESSION: 1. Cardiomegaly and interstitial edema. 2. Persistent bibasilar opacity, right greater than left.   Electronically Signed   By: Norva Pavlov M.D.   On: 01/07/2015 18:02    CBC  Recent Labs Lab 01/08/15 0419 01/09/15 0305  WBC 4.4 3.7*  HGB 10.3* 10.3*  HCT 34.3* 34.2*  PLT 148* 146*  MCV 102.1* 103.0*  MCH 30.7 31.0  MCHC 30.0 30.1  RDW 16.3* 16.3*    Chemistries   Recent Labs Lab 01/09/15 0910 01/10/15 0308  01/11/15 0630 01/12/15 0500 01/13/15 0500 01/14/15 0501  NA  --  140 141 144 144 141  K  --  4.2 3.7 3.6 3.6 3.5  CL  --  101 101 105 103 100*  CO2  --  29 30 32 32 33*  GLUCOSE  --  76 77 110* 105* 95  BUN  --  38* 39* 35* 33* 30*  CREATININE  --  1.34* 1.33* 1.37* 1.26* 1.14*  CALCIUM  --  8.9 8.8* 9.2 9.3 9.2  MG 2.2  --   --   --   --   --   AST  --  --   --   ALT  --  --   --   ALKPHOS  --  68 68 75  --   --   BILITOT  --  0.8 0.8 0.8  --   --    ------------------------------------------------------------------------------------------------------------------ estimated creatinine clearance is 44.7 mL/min (by C-G formula based on Cr of 1.14). ------------------------------------------------------------------------------------------------------------------ No results for input(s): HGBA1C in the last 72 hours. ------------------------------------------------------------------------------------------------------------------ No results for input(s): CHOL, HDL, LDLCALC, TRIG, CHOLHDL, LDLDIRECT in the last 72 hours. ------------------------------------------------------------------------------------------------------------------  Recent Labs  01/11/15 1350  TSH 15.717*   ------------------------------------------------------------------------------------------------------------------ No results for input(s): VITAMINB12, FOLATE, FERRITIN, TIBC, IRON, RETICCTPCT in the last 72 hours.  Coagulation profile No results for input(s): INR, PROTIME in the last 168 hours.  No results for input(s): DDIMER in the last 72 hours.  Cardiac Enzymes  Recent Labs Lab 01/09/15 0305 01/09/15 0910 01/09/15 1503  TROPONINI 0.03 0.03 0.03   ------------------------------------------------------------------------------------------------------------------ Invalid input(s): POCBNP   Recent Labs  01/12/15 1156 01/12/15 1600 01/12/15 2058 01/13/15 0826 01/13/15 1154  01/13/15 1659  GLUCAP 107* 106* 115* 82 92 123*     Keandre Linden M.D. [facility] 01/14/2015, 9:27 AM  Pager: [phone]   Between 7am to 7pm - call Pager - [phone]  After 7pm go to [email or web address] - password [email or web address]  Call night coverage person covering after 7pm

## 2015-01-15 LAB — BASIC METABOLIC PANEL
Anion gap: 9 (ref 5–15)
BUN: 26 mg/dL — ABNORMAL HIGH (ref 6–20)
CHLORIDE: 97 mmol/L — AB (ref 101–111)
CO2: 37 mmol/L — ABNORMAL HIGH (ref 22–32)
Calcium: 9.4 mg/dL (ref 8.9–10.3)
Creatinine, Ser: 0.98 mg/dL (ref 0.44–1.00)
GFR calc Af Amer: >60 mL/min (ref >60)
GFR calc non Af Amer: 53 mL/min — ABNORMAL LOW (ref >60)
Glucose, Bld: 98 mg/dL (ref 70–99)
Potassium: 3.5 mmol/L (ref 3.5–5.1)
Sodium: 143 mmol/L (ref 135–145)

## 2015-01-15 LAB — CBC
HEMATOCRIT: 34.3 % — AB (ref 36.0–46.0)
HEMOGLOBIN: 10.4 g/dL — AB (ref 12.0–15.0)
MCH: 31 pg (ref 26.0–34.0)
MCHC: 30.3 g/dL (ref 30.0–36.0)
MCV: 102.4 fL — ABNORMAL HIGH (ref 78.0–100.0)
Platelets: 110 10*3/uL — ABNORMAL LOW (ref 150–400)
RBC: 3.35 MIL/uL — ABNORMAL LOW (ref 3.87–5.11)
RDW: 17.4 % — ABNORMAL HIGH (ref 11.5–15.5)
WBC: 3.8 10*3/uL — ABNORMAL LOW (ref 4.0–10.5)

## 2015-01-15 LAB — CARBOXYHEMOGLOBIN
Carboxyhemoglobin: 1.5 % (ref 0.5–1.5)
METHEMOGLOBIN: 1.2 % (ref 0.0–1.5)
O2 Saturation: 71.9 %
TOTAL HEMOGLOBIN: 10.6 g/dL — AB (ref 12.0–16.0)

## 2015-01-15 LAB — GLUCOSE, CAPILLARY
GLUCOSE-CAPILLARY: 80 mg/dL (ref 70–99)
Glucose-Capillary: 79 mg/dL (ref 70–99)
Glucose-Capillary: 78 mg/dL (ref 70–99)

## 2015-01-15 MED ORDER — POTASSIUM CHLORIDE 10 MEQ/50ML IV SOLN
INTRAVENOUS | Status: AC
  Filled 2015-01-15: qty 50

## 2015-01-15 MED ORDER — VALACYCLOVIR HCL 500 MG PO TABS
1000.0000 mg | ORAL_TABLET | Freq: Three times a day (TID) | ORAL | Status: DC
Start: 2015-01-15 — End: 2015-01-24
  Administered 2015-01-15 – 2015-01-24 (×28): 1000 mg via ORAL
  Filled 2015-01-15 (×31): qty 2

## 2015-01-15 NOTE — Progress Notes (Addendum)
[facility]                                                                              Patient Demographics  Melanie Camacho, is a 81 y.o. female, DOB - February 25, 1932, WLN:[id number]  Admit date - 01/05/2015   Admitting Physician Lorretta Harp, MD  Outpatient Primary MD for the patient is PROVIDER NOT IN SYSTEM  LOS - 10   Chief Complaint  Patient presents with  . Shortness of Breath       Brief HPI   Patient is a 81 year old female with hypertension, diabetes, diet controlled, DM, CAD, AVR, stage 3 CKD, afib on eliquis, presented on 4/28 for abdominal pain and shortness of breath.  She was evaluated with a CT abdomen and pelvis , which did not reveal any acute intra abdominal findings. There was severe anasarca with moderate right and small left pleural effusions. Patient was admitted for acute on chronic diastolic heart failure and started on IV lasix. From 4/30 and 5/1, patient was hypotensive, tachycardic and her lasix and BB was held. Cardiology and heart failure team were consulted for recommendations.  Patient was noted to be lethargic on 5/3, CT head was negative for acute intracranial pathology, ABG did not show acute hypercapnia, PCO2 of 50, ammonia level was elevated at 91 and started on lactulose 5/5 patient continued to be somnolent, MRI of the brain was obtained which was negative for acute infarct.. Medications were adjusted, discontinued Neurontin and Requip. Lactulose was increased 5/6, 5,7: Patient's mental status continues to improve, still very deconditioned, anasarca with fluid overload    Assessment & Plan    Principal Problem:  Acute on chronic diastolic CHF exacerbation with anasarca, acute on chronic RV failure/cor pulmonale: - significant anasarca and volume overloaded, CHF team following, on Lasix and metolazone. - Excellent diuresis, negative balance of 13 Liters, weight down from 254-> 231lbs  Atrial fibrillation; Rate controlled, continue  eliquis Had epistaxis issue this morning, currently controlled, and dried blood in nares, will monitor closely. Advised patient not to blow her nose. Probably doesn't need O2 via nasal cannula, wean off.   Acute encephalopathy:  improved, alert and oriented 3,  medication effect along with hepatic encephalopathy, hypothyroidism - CT head is negative, MRI of the brain  5/5 negative for acute CVA, atrophia and mild chronic vascular ischemia  - Discontinued Neurontin, Requip - continue lactulose, has rectal tube. No need to check ammonia levels if mental status is improving.  - TSH 15.71, increased Synthroid to 50 MCG daily   Hypothyroidism: Continue Synthroid, increased to 50 MCG daily, TSH was 7.7 on 4/8, now 15.71  Restless leg syndrome: Discontinued Requip and Neurontin due to mental status changes  Mild macrocytic anemia; Stable continue to monitor.   Hypotension: Improved. unclear etiology, afebrile, no leukocytosis - UA is negative for infection. CXR on 4/30 did not show any consolidation.  Stage 3 CKD;  - Creatinine appears at baseline  Rash: left dermatomal on breast, axillary line and back, appears to be shingles - Place on Valtrex 1 g 3 times a day for 10 days, airborne precautions  Code Status: full code  Family Communication: No family member at bed side. All the imagings, test results discussed with the patient today   Disposition Plan: Transfer to telemetry if okay with cardiology?   Time Spent in minutes  25 minutes  Procedures  None  Consults    cardiology  DVT Prophylaxis  eliquis  Medications  Scheduled Meds: . antiseptic oral rinse  7 mL Mouth Rinse BID  . apixaban  5 mg Oral BID  . bisoprolol  2.5 mg Oral Daily  . colesevelam  1,875 mg Oral BID WC  . ferrous fumarate  1 tablet Oral Daily  . furosemide  80 mg Intravenous 3 times per day  . lactulose  40 g Oral TID  . levothyroxine  50 mcg Oral QAC breakfast  . metolazone  5 mg Oral BID  .  multivitamin  2 tablet Oral Daily  . pantoprazole  40 mg Oral BID  . potassium chloride  40 mEq Oral BID  . sodium chloride  3 mL Intravenous Q12H   Continuous Infusions:  PRN Meds:.sodium chloride, acetaminophen, gi cocktail, ondansetron (ZOFRAN) IV, oxyCODONE, phenazopyridine, sodium chloride   Antibiotics   Anti-infectives    None        Subjective:   Nakeya Adinolfi was seen and examined today. Alert and oriented 3, had epistaxis this morning, currently controlled and resolved, dried blood in the nares. Patient denies any chest pain, shortness of breath, nausea, vomiting or any new weakness. Still has significant fluid overload with anasarca. No acute events overnight.    Objective:   Blood pressure 97/37, pulse 61, temperature 97.5 F (36.4 C), temperature source Oral, resp. rate 23, height 5\' 2"  (1.575 m), weight 104.781 kg (231 lb), SpO2 99 %.  Wt Readings from Last 3 Encounters:  01/14/15 104.781 kg (231 lb)  12/30/14 104.599 kg (230 lb 9.6 oz)  12/29/13 87.7 kg (193 lb 5.5 oz)     Intake/Output Summary (Last 24 hours) at 01/15/15 0853 Last data filed at 01/15/15 0500  Gross per 24 hour  Intake    660 ml  Output   3435 ml  Net  -2775 ml    Exam  Gen: Alert and oriented 3 NAD  HEENT:  PERRLA, EOMI, dried blood in the nares, no ongoing epistaxis  Neck: Supple,JVD +, no masses  CVS:  irregularly irregular  Resp: Decreased breath sounds at the bases, fairly clear, no wheezing or rhonchi   Abdomen: Morbidly obese,  anasarca, Soft, NT, + bowel sounds  Ext: no cyanosis clubbing, 3+ edema, leg wraps  Neuro:  no new focal neurological deficits  Skin: Rash on left dermatomal area under left breast, axillary line and radiating back to scapular region  Psych:   Alert and oriented 3, normal affect   Data Review   Micro Results Recent Results (from the past 240 hour(s))  Culture, Urine     Status: None   Collection Time: 01/07/15 12:30 AM  Result Value  Ref Range Status   Specimen Description URINE, CATHETERIZED  Final   Special Requests n/a Immunocompromised  Final   Colony Count NO GROWTH Performed at [facility]   Final   Culture NO GROWTH Performed at [facility]   Final   Report Status 01/08/2015 FINAL  Final  Clostridium Difficile by PCR     Status: None   Collection Time: 01/12/15  4:44 PM  Result Value Ref Range Status   C difficile by pcr NEGATIVE NEGATIVE Final    Radiology Reports Ct  Abdomen Pelvis Wo Contrast  01/06/2015   CLINICAL DATA:  Intermittent right lower quadrant pain for several days.  EXAM: CT ABDOMEN AND PELVIS WITHOUT CONTRAST  TECHNIQUE: Multidetector CT imaging of the abdomen and pelvis was performed following the standard protocol without IV contrast.  COMPARISON:  None.  FINDINGS: BODY WALL: Severe anasarca. The abdominal wall is incompletely imaged but there is no gross fluid collection. Lax abdominal wall with wide necked herniation below the umbilicus, containing nonobstructed small bowel. Muscular or fascia calcification in the abdominal wall which could be dystrophic or from previous surgery.  LOWER CHEST: Moderate right and small left pleural effusions with multi segment atelectasis.  Aortic valve replacement. There is mild cardiomegaly and extensive coronary atherosclerosis.  ABDOMEN/PELVIS:  Liver: Large liver, with the right lobe projecting to the iliac crest. Rounded masses in the right liver are near water density, excluding a 2 cm lesion in segment 6 which was likely not visualized/evaluated on sonography from 8 days ago.  Biliary: Cholecystectomy. There is suggestion of a choledochoenterostomy, although there is no pneumobilia.  Pancreas: Unremarkable.  Spleen: Unremarkable.  Adrenals: Unremarkable.  Kidneys and ureters: No hydronephrosis or stone.  Bladder: Decompressed by a Foley catheter.  Reproductive: Unremarkable for age.  Bowel: Distal gastrectomy. The anastomoses are patent and  there is no surrounding inflammatory change. No pericecal inflammation. No bowel obstruction.  Retroperitoneum: No mass or adenopathy.  Peritoneum: Small perihepatic ascites, likely from volume overload.  Vascular: No acute abnormality.  Diffuse arterial calcification.  OSSEOUS: No acute abnormalities.  IMPRESSION: 1. No acute intra-abdominal findings. 2. Severe anasarca. Moderate right and small left pleural effusions. 3. A 2 cm lesion in segment 6 is indeterminate due to density. Other liver lesions appear cystic. 4. Ventral abdominal hernia containing nonobstructed small bowel. 5. Distal gastrectomy.   Electronically Signed   By: Marnee Spring M.D.   On: 01/06/2015 10:36   Dg Chest 2 View  01/05/2015   CLINICAL DATA:  Congestive heart failure shortness breath.  EXAM: CHEST  2 VIEW  COMPARISON:  Radiograph 12/27/2014  FINDINGS: Cardiac silhouette is enlarged. There is bilateral pleural effusions similar prior. There is mild central venous pulmonary congestion. Mild basilar atelectasis.  IMPRESSION: Bilateral pleural effusions and central venous pulmonary congestion. Findings similar to comparison exam.   Electronically Signed   By: Genevive Bi M.D.   On: 01/05/2015 17:11   Dg Chest 2 View  12/27/2014   CLINICAL DATA:  Chest pain for 1 day.  EXAM: CHEST  2 VIEW  COMPARISON:  12/20/2014.  FINDINGS: Stable enlarged cardiac silhouette. No significant change in bilateral pleural effusions. Post CABG changes are again demonstrated. Evidence of bilateral chronic rotator cuff tears. Left shoulder degenerative changes.  IMPRESSION: Stable cardiomegaly and bilateral pleural effusions.   Electronically Signed   By: Beckie Salts M.D.   On: 12/27/2014 15:47   Dg Chest 2 View  12/20/2014   CLINICAL DATA:  Congestive failure  EXAM: CHEST  2 VIEW  COMPARISON:  12/15/2014  FINDINGS: The cardiac shadow remains enlarged. A right-sided pleural effusion and right basilar atelectasis is again identified. Postsurgical  changes are seen. The left lung shows a small effusion but stable. No acute bony abnormality is seen.  IMPRESSION: Bilateral effusions right greater than left. Right basilar atelectasis is noted as well.   Electronically Signed   By: Alcide Clever M.D.   On: 12/20/2014 12:16   Ct Head Wo Contrast  01/10/2015   CLINICAL DATA:  Drowsiness  EXAM:  CT HEAD WITHOUT CONTRAST  TECHNIQUE: Contiguous axial images were obtained from the base of the skull through the vertex without intravenous contrast.  COMPARISON:  None.  FINDINGS: Bony calvarium is intact. Mild atrophic changes are seen. No findings to suggest acute hemorrhage, acute infarction or space-occupying mass lesion are identified.  IMPRESSION: Mild atrophic changes without acute abnormality.   Electronically Signed   By: Alcide Clever M.D.   On: 01/10/2015 12:35   Mr Brain Wo Contrast  01/12/2015   CLINICAL DATA:  Acute encephalopathy  EXAM: MRI HEAD WITHOUT CONTRAST  TECHNIQUE: Multiplanar, multiecho pulse sequences of the brain and surrounding structures were obtained without intravenous contrast.  COMPARISON:  CT head 01/10/2015  FINDINGS: Image quality degraded by mild to moderate motion.  Generalized atrophy. Negative for hydrocephalus. Pituitary normal in size  Negative for acute infarct  Chronic microvascular ischemic changes in the white matter are mild. No cortical infarct. No brainstem lesion. Cerebellum intact  Negative for intracranial hemorrhage  12 mm calcified mass over the convexity on the left compatible with meningioma. No edema in the adjacent brain.  IMPRESSION: Atrophy and mild chronic microvascular ischemia.  No acute infarct  12 mm calcified meningioma over the convexity on the left.   Electronically Signed   By: Marlan Palau M.D.   On: 01/12/2015 10:02   US Renal  12/29/2014   CLINICAL DATA:  Acute renal failure  EXAM: RENAL/URINARY TRACT ULTRASOUND COMPLETE  COMPARISON:  None.  FINDINGS: Right Kidney:  Length: 9.1 cm. Diffuse  cortical thinning is noted. No focal mass lesion or hydronephrosis is noted.  Left Kidney:  Length: 10.2 cm. Echogenicity within normal limits. No mass or hydronephrosis visualized.  Bladder:  Appears normal for degree of bladder distention.  A 3.2 cm hepatic cyst is noted. Small bilateral pleural effusions are noted.  IMPRESSION: Mild cortical thinning in the right kidney.  No mass lesion or hydronephrosis is noted.  Small bilateral pleural effusions.  Hepatic cyst.   Electronically Signed   By: Alcide Clever M.D.   On: 12/29/2014 11:28   Ir Fluoro Guide Cv Line Right  01/10/2015   CLINICAL DATA:  Multiple comorbidities including atrial fibrillation, hypertension, CHF, morbid obesity, poor peripheral access  EXAM: POWER PICC LINE PLACEMENT WITH ULTRASOUND AND FLUOROSCOPIC GUIDANCE  FLUOROSCOPY TIME:  30 seconds, 9 mGy  PROCEDURE: The patient was advised of the possible risks andcomplications and agreed to undergo the procedure. The patient was then brought to the angiographic suite for the procedure.  The right arm was prepped with chlorhexidine, drapedin the usual sterile fashion using maximum barrier technique (cap and mask, sterile gown, sterile gloves, large sterile sheet, hand hygiene and cutaneous antisepsis) and infiltrated locally with 1% Lidocaine.  Ultrasound demonstrated patency of the right brachial vein, and this was documented with an image. Under real-time ultrasound guidance, this vein was accessed with a 21 gauge micropuncture needle and image documentation was performed. A 0.018 wire was introduced in to the vein. Over this, a 5 [place] double lumen power PICC was advanced to the lower SVC/right atrial junction. Fluoroscopy during the procedure and fluoro spot radiograph confirms appropriate catheter position. The catheter was flushed and covered with asterile dressing.  Catheter length: 38  Complications: None  IMPRESSION: Successful right arm power PICC line placement with ultrasound and  fluoroscopic guidance. The catheter is ready for use.   Electronically Signed   By: Judie Petit.  Shick M.D.   On: 01/10/2015 12:56   Ir US Guide Vasc  Access Right  01/10/2015   CLINICAL DATA:  Multiple comorbidities including atrial fibrillation, hypertension, CHF, morbid obesity, poor peripheral access  EXAM: POWER PICC LINE PLACEMENT WITH ULTRASOUND AND FLUOROSCOPIC GUIDANCE  FLUOROSCOPY TIME:  30 seconds, 9 mGy  PROCEDURE: The patient was advised of the possible risks andcomplications and agreed to undergo the procedure. The patient was then brought to the angiographic suite for the procedure.  The right arm was prepped with chlorhexidine, drapedin the usual sterile fashion using maximum barrier technique (cap and mask, sterile gown, sterile gloves, large sterile sheet, hand hygiene and cutaneous antisepsis) and infiltrated locally with 1% Lidocaine.  Ultrasound demonstrated patency of the right brachial vein, and this was documented with an image. Under real-time ultrasound guidance, this vein was accessed with a 21 gauge micropuncture needle and image documentation was performed. A 0.018 wire was introduced in to the vein. Over this, a 5 [place] double lumen power PICC was advanced to the lower SVC/right atrial junction. Fluoroscopy during the procedure and fluoro spot radiograph confirms appropriate catheter position. The catheter was flushed and covered with asterile dressing.  Catheter length: 38  Complications: None  IMPRESSION: Successful right arm power PICC line placement with ultrasound and fluoroscopic guidance. The catheter is ready for use.   Electronically Signed   By: Judie Petit.  Shick M.D.   On: 01/10/2015 12:56   Dg Chest Port 1 View  01/07/2015   CLINICAL DATA:  Respiratory difficulty. Cough. Congestion. Cardiomegaly and CHF. No fever. No chest pain.  EXAM: PORTABLE CHEST - 1 VIEW  COMPARISON:  01/05/2015  FINDINGS: Heart is enlarged. There is moderate right pleural effusion associated right basilar  atelectasis. Small left pleural effusion is present. There are changes of interstitial edema.  IMPRESSION: 1. Cardiomegaly and interstitial edema. 2. Persistent bibasilar opacity, right greater than left.   Electronically Signed   By: Norva Pavlov M.D.   On: 01/07/2015 18:02    CBC  Recent Labs Lab 01/09/15 0305 01/15/15 0401  WBC 3.7* 3.8*  HGB 10.3* 10.4*  HCT 34.2* 34.3*  PLT 146* 110*  MCV 103.0* 102.4*  MCH 31.0 31.0  MCHC 30.1 30.3  RDW 16.3* 17.4*    Chemistries   Recent Labs Lab 01/09/15 0910 01/10/15 0308 01/11/15 0630 01/12/15 0500 01/13/15 0500 01/14/15 0501 01/15/15 0401  NA  --  140 141 144 144 141 143  K  --  4.2 3.7 3.6 3.6 3.5 3.5  CL  --  101 101 105 103 100* 97*  CO2  --  29 30 32 32 33* 37*  GLUCOSE  --  76 77 110* 105* 95 98  BUN  --  38* 39* 35* 33* 30* 26*  CREATININE  --  1.34* 1.33* 1.37* 1.26* 1.14* 0.98  CALCIUM  --  8.9 8.8* 9.2 9.3 9.2 9.4  MG 2.2  --   --   --   --   --   --   AST  --  31 26 25   --   --   --   ALT  --  14 15 14   --   --   --   ALKPHOS  --  68 68 75  --   --   --   BILITOT  --  0.8 0.8 0.8  --   --   --    ------------------------------------------------------------------------------------------------------------------ estimated creatinine clearance is 52 mL/min (by C-G formula based on Cr of 0.98). ------------------------------------------------------------------------------------------------------------------ No results for input(s): HGBA1C in the last 72 hours. ------------------------------------------------------------------------------------------------------------------  No results for input(s): CHOL, HDL, LDLCALC, TRIG, CHOLHDL, LDLDIRECT in the last 72 hours. ------------------------------------------------------------------------------------------------------------------ No results for input(s): TSH, T4TOTAL, T3FREE, THYROIDAB in the last 72 hours.  Invalid input(s):  FREET3 ------------------------------------------------------------------------------------------------------------------ No results for input(s): VITAMINB12, FOLATE, FERRITIN, TIBC, IRON, RETICCTPCT in the last 72 hours.  Coagulation profile No results for input(s): INR, PROTIME in the last 168 hours.  No results for input(s): DDIMER in the last 72 hours.  Cardiac Enzymes  Recent Labs Lab 01/09/15 0305 01/09/15 0910 01/09/15 1503  TROPONINI 0.03 0.03 0.03   ------------------------------------------------------------------------------------------------------------------ Invalid input(s): POCBNP   Recent Labs  01/13/15 2212 01/14/15 0824 01/14/15 1212 01/14/15 1712 01/14/15 2205 01/15/15 0743  GLUCAP 111* 84 102* 94 105* 79     Helina Hullum M.D. [facility] 01/15/2015, 8:53 AM  Pager: [phone]   Between 7am to 7pm - call Pager - [phone]  After 7pm go to [email or web address] - password [email or web address]  Call night coverage person covering after 7pm

## 2015-01-15 NOTE — Progress Notes (Signed)
Pt not using BIPAP machine not in the room

## 2015-01-15 NOTE — Progress Notes (Signed)
Primary cardiologist: Dr. Nicki Guadalajara  Seen for followup: Diastolic heart failure with RV dysfunction, atrial fibrillation  Subjective:   No CP, no palps. Feels "so-so". Breathing improved.   Objective:   Temp:  [97.5 F (36.4 C)-98.7 F (37.1 C)] 97.5 F (36.4 C) (05/08 0800) Pulse Rate:  [47-110] 47 (05/08 1000) Resp:  [0-28] 0 (05/08 1000) BP: (86-135)/(33-103) 86/49 mmHg (05/08 1000) SpO2:  [93 %-100 %] 100 % (05/08 1000) Weight:  [227 lb 8.2 oz (103.2 kg)] 227 lb 8.2 oz (103.2 kg) (05/08 0900) Last BM Date: 01/13/15  Filed Weights   01/13/15 0500 01/14/15 0400 01/15/15 0900  Weight: 243 lb 2.7 oz (110.3 kg) 231 lb (104.781 kg) 227 lb 8.2 oz (103.2 kg)    Intake/Output Summary (Last 24 hours) at 01/15/15 1103 Last data filed at 01/15/15 0500  Gross per 24 hour  Intake    480 ml  Output   3435 ml  Net  -2955 ml    Telemetry: Atrial fibrillation.  Exam:  General: Obese woman, no distress.  Lungs: Mild crackles toward the bases.  Cardiac: Elevated JVP, irregularly irregular. 1/6 SEM  Abdomen: Protuberant.  Extremities: Lower legs wrapped, chronic edema present up to the thighs.  Lab Results:  Basic Metabolic Panel:  Recent Labs Lab 01/09/15 0910  01/13/15 0500 01/14/15 0501 01/15/15 0401  NA  --   < > 144 141 143  K  --   < > 3.6 3.5 3.5  CL  --   < > 103 100* 97*  CO2  --   < > 32 33* 37*  GLUCOSE  --   < > 105* 95 98  BUN  --   < > 33* 30* 26*  CREATININE  --   < > 1.26* 1.14* 0.98  CALCIUM  --   < > 9.3 9.2 9.4  MG 2.2  --   --   --   --   < > = values in this interval not displayed.  Liver Function Tests:  Recent Labs Lab 01/10/15 0308 01/11/15 0630 01/12/15 0500  AST ALT ALKPHOS 68 68 75  BILITOT 0.8 0.8 0.8  PROT 6.4* 6.6 6.9  ALBUMIN 2.7* 2.6* 2.8*    CBC:  Recent Labs Lab 01/09/15 0305 01/15/15 0401  WBC 3.7* 3.8*  HGB 10.3* 10.4*  HCT 34.2* 34.3*  MCV 103.0* 102.4*  PLT 146* 110*     Cardiac Enzymes:  Recent Labs Lab 01/09/15 0305 01/09/15 0910 01/09/15 1503  TROPONINI 0.03 0.03 0.03    Limited echocardiogram 01/11/2015: Study Conclusions  - Left ventricle: The cavity size was normal. There was mild concentric hypertrophy. Systolic function was mildly reduced. The estimated ejection fraction was in the range of 45% to 50%. Possible hypokinesis of the entireinferolateral myocardium. - Aortic valve: The AV is not visualized adequately to comment on. There was mild regurgitation. - Mitral valve: Calcified annulus. Moderate thickening. There was mild regurgitation. - Right ventricle: RV was not measured by appears dilated. Systolic function was moderately to severely reduced. - Pulmonary arteries: PA peak pressure: 46 mm Hg (S). - Pericardium, extracardiac: There was a left pleural effusion.  Impressions:  - The right ventricular systolic pressure was increased consistent with moderate pulmonary hypertension.    Medications:   Scheduled Medications: . antiseptic oral rinse  7 mL Mouth Rinse BID  . apixaban  5 mg Oral BID  . bisoprolol  2.5 mg Oral Daily  .  colesevelam  1,875 mg Oral BID WC  . ferrous fumarate  1 tablet Oral Daily  . furosemide  80 mg Intravenous 3 times per day  . lactulose  40 g Oral TID  . levothyroxine  50 mcg Oral QAC breakfast  . metolazone  5 mg Oral BID  . multivitamin  2 tablet Oral Daily  . pantoprazole  40 mg Oral BID  . potassium chloride  40 mEq Oral BID  . sodium chloride  3 mL Intravenous Q12H     PRN Medications: sodium chloride, acetaminophen, gi cocktail, ondansetron (ZOFRAN) IV, oxyCODONE, phenazopyridine, sodium chloride   Assessment:   1. Acute on chronic diastolic HF R>>L with anasarca. Urine output has increased substantially on IV Lasix and metolazone. Keep it going. Great. Renal function stable.   2. Acute on chronic RV failure with acute cor pulmonale. PASP 46 mmHg by  echocardiogram.  3. AoV disease s/p bioprosthetic AVR.  4. Morbid obesity.  5. Chronic AF.  6. Hypoalbuminemia- Albumin 2.7.  7. PAF - Eliquis. Mild nose bleed. Resolved.    Plan/Discussion:    Continue IV Lasix and metolazone with excellent diuresis. As per Dr. Prescott Gum note, ultimately will need right heart catheterization once volume status further optimized.   Donato Schultz, MD

## 2015-01-16 LAB — BASIC METABOLIC PANEL
ANION GAP: 10 (ref 5–15)
BUN: 23 mg/dL — ABNORMAL HIGH (ref 6–20)
CO2: 35 mmol/L — AB (ref 22–32)
Calcium: 9.3 mg/dL (ref 8.9–10.3)
Chloride: 94 mmol/L — ABNORMAL LOW (ref 101–111)
Creatinine, Ser: 0.95 mg/dL (ref 0.44–1.00)
GFR calc non Af Amer: 55 mL/min — ABNORMAL LOW (ref >60)
Glucose, Bld: 77 mg/dL (ref 70–99)
Potassium: 3.5 mmol/L (ref 3.5–5.1)
Sodium: 139 mmol/L (ref 135–145)

## 2015-01-16 LAB — CBC
HEMATOCRIT: 34.5 % — AB (ref 36.0–46.0)
HEMOGLOBIN: 10.6 g/dL — AB (ref 12.0–15.0)
MCH: 31.5 pg (ref 26.0–34.0)
MCHC: 30.7 g/dL (ref 30.0–36.0)
MCV: 102.7 fL — ABNORMAL HIGH (ref 78.0–100.0)
PLATELETS: 110 10*3/uL — AB (ref 150–400)
RBC: 3.36 MIL/uL — AB (ref 3.87–5.11)
RDW: 17.6 % — ABNORMAL HIGH (ref 11.5–15.5)
WBC: 3.8 10*3/uL — AB (ref 4.0–10.5)

## 2015-01-16 LAB — GLUCOSE, CAPILLARY
GLUCOSE-CAPILLARY: 96 mg/dL (ref 70–99)
Glucose-Capillary: 79 mg/dL (ref 70–99)
Glucose-Capillary: 114 mg/dL — ABNORMAL HIGH (ref 70–99)
Glucose-Capillary: 85 mg/dL (ref 70–99)

## 2015-01-16 LAB — CARBOXYHEMOGLOBIN
CARBOXYHEMOGLOBIN: 2.2 % — AB (ref 0.5–1.5)
Methemoglobin: 1.1 % (ref 0.0–1.5)
O2 Saturation: 87.3 %
TOTAL HEMOGLOBIN: 10.8 g/dL — AB (ref 12.0–16.0)

## 2015-01-16 MED ORDER — LACTULOSE 10 GM/15ML PO SOLN
20.0000 g | Freq: Three times a day (TID) | ORAL | Status: DC
  Filled 2015-01-16 (×3): qty 30

## 2015-01-16 NOTE — Progress Notes (Signed)
[facility]                                                                              Patient Demographics  Melanie Camacho, is a 81 y.o. female, DOB - [birth year], ZOX:[id number]  Admit date - 01/05/2015   Admitting Physician Lorretta Harp, MD  Outpatient Primary MD for the patient is PROVIDER NOT IN SYSTEM  LOS - 11   Chief Complaint  Patient presents with  . Shortness of Breath       Brief HPI   Patient is a 81 year old female with hypertension, diabetes, diet controlled, DM, CAD, AVR, stage 3 CKD, afib on eliquis, presented on 4/28 for abdominal pain and shortness of breath.  She was evaluated with a CT abdomen and pelvis , which did not reveal any acute intra abdominal findings. There was severe anasarca with moderate right and small left pleural effusions. Patient was admitted for acute on chronic diastolic heart failure and started on IV lasix. From 4/30 and 5/1, patient was hypotensive, tachycardic and her lasix and BB was held. Cardiology and heart failure team were consulted for recommendations.  Patient was noted to be lethargic on 5/3, CT head was negative for acute intracranial pathology, ABG did not show acute hypercapnia, PCO2 of 50, ammonia level was elevated at 91 and started on lactulose 5/5 patient continued to be somnolent, MRI of the brain was obtained which was negative for acute infarct.. Medications were adjusted, discontinued Neurontin and Requip. Lactulose was increased 5/6, 5,7: Patient's mental status continues to improve, still very deconditioned, anasarca with fluid overload    Assessment & Plan    Principal Problem:  Acute on chronic diastolic CHF exacerbation with anasarca, acute on chronic RV failure/cor pulmonale: - significant anasarca and volume overloaded, CHF team following, on Lasix and metolazone. - Excellent diuresis, negative balance of 17 Liters, weight down from 254-> 220lbs  Atrial fibrillation; Rate controlled, continue  eliquis Epistaxis resolved  Acute encephalopathy:  improved, alert and oriented 3,  medication effect along with hepatic encephalopathy, hypothyroidism - CT head is negative, MRI of the brain  5/5 negative for acute CVA, atrophia and mild chronic vascular ischemia  - Discontinued Neurontin, Requip - continue lactulose. No need to check ammonia levels if mental status is improving.  - TSH 15.71, increased Synthroid to 50 MCG daily    Rash: left dermatomal on breast, axillary line and back, appears to be shingles - Continue Valtrex 1 g 3 times a day for 10 days, airborne and contact precautions   Hypothyroidism: Continue Synthroid, increased to 50 MCG daily, TSH was 7.7 on 4/8, now 15.71  Restless leg syndrome: Discontinued Requip and Neurontin due to mental status changes  Mild macrocytic anemia; Stable continue to monitor.   Hypotension: Improved. unclear etiology, afebrile, no leukocytosis - UA is negative for infection. CXR on 4/30 did not show any consolidation.  Stage 3 CKD;  - Creatinine appears at baseline   Code Status: full code  Family Communication: No family member at bed side. All the imagings, test results discussed with the patient today   Disposition Plan: Transfer to telemetry if okay with cardiology?  Time Spent in minutes  25 minutes  Procedures  None  Consults    cardiology  DVT Prophylaxis  eliquis  Medications  Scheduled Meds: . antiseptic oral rinse  7 mL Mouth Rinse BID  . apixaban  5 mg Oral BID  . bisoprolol  2.5 mg Oral Daily  . colesevelam  1,875 mg Oral BID WC  . ferrous fumarate  1 tablet Oral Daily  . furosemide  80 mg Intravenous 3 times per day  . lactulose  40 g Oral TID  . levothyroxine  50 mcg Oral QAC breakfast  . metolazone  5 mg Oral BID  . multivitamin  2 tablet Oral Daily  . pantoprazole  40 mg Oral BID  . potassium chloride  40 mEq Oral BID  . sodium chloride  3 mL Intravenous Q12H  . valACYclovir  1,000 mg  Oral TID   Continuous Infusions:  PRN Meds:.sodium chloride, acetaminophen, gi cocktail, ondansetron (ZOFRAN) IV, oxyCODONE, phenazopyridine, sodium chloride   Antibiotics   Anti-infectives    Start     Dose/Rate Route Frequency Ordered Stop   01/15/15 1115  valACYclovir (VALTREX) tablet 1,000 mg     1,000 mg Oral 3 times daily 01/15/15 1114 01/25/15 0959        Subjective:   Melanie Camacho was seen and examined today. Alert and oriented 3, shingles on the left dermatomal breast/back, no further epistaxis.  Patient denies any chest pain, shortness of breath, nausea, vomiting or any new weakness. No new acute events overnight  Objective:   Blood pressure 91/55, pulse 114, temperature 97.7 F (36.5 C), temperature source Oral, resp. rate 27, height 5\' 2"  (1.575 m), weight 99.9 kg (220 lb 3.8 oz), SpO2 93 %.  Wt Readings from Last 3 Encounters:  01/16/15 99.9 kg (220 lb 3.8 oz)  12/30/14 104.599 kg (230 lb 9.6 oz)  12/29/13 87.7 kg (193 lb 5.5 oz)     Intake/Output Summary (Last 24 hours) at 01/16/15 0953 Last data filed at 01/16/15 0855  Gross per 24 hour  Intake    183 ml  Output   3875 ml  Net  -3692 ml    Exam  Gen: Alert and oriented 3 NAD  HEENT:  PERRLA, EOM  Neck: Supple,JVD +, no masses  CVS:  irregularly irregular  Resp: Fairly clear, no wheezing or rhonchi  Abdomen: Morbidly obese,  anasarca, Soft, NT, nbs  Ext: no cyanosis clubbing, 3+ edema  Neuro:  no new focal neurological deficits  Skin: Rash on left dermatomal area under left breast, axillary line and radiating back to scapular region  Psych:   Alert and oriented 3, normal affect   Data Review   Micro Results Recent Results (from the past 240 hour(s))  Culture, Urine     Status: None   Collection Time: 01/07/15 12:30 AM  Result Value Ref Range Status   Specimen Description URINE, CATHETERIZED  Final   Special Requests n/a Immunocompromised  Final   Colony Count NO  GROWTH Performed at [facility]   Final   Culture NO GROWTH Performed at [facility]   Final   Report Status 01/08/2015 FINAL  Final  Clostridium Difficile by PCR     Status: None   Collection Time: 01/12/15  4:44 PM  Result Value Ref Range Status   C difficile by pcr NEGATIVE NEGATIVE Final    Radiology Reports Ct Abdomen Pelvis Wo Contrast  01/06/2015   CLINICAL DATA:  Intermittent right lower quadrant pain  for several days.  EXAM: CT ABDOMEN AND PELVIS WITHOUT CONTRAST  TECHNIQUE: Multidetector CT imaging of the abdomen and pelvis was performed following the standard protocol without IV contrast.  COMPARISON:  None.  FINDINGS: BODY WALL: Severe anasarca. The abdominal wall is incompletely imaged but there is no gross fluid collection. Lax abdominal wall with wide necked herniation below the umbilicus, containing nonobstructed small bowel. Muscular or fascia calcification in the abdominal wall which could be dystrophic or from previous surgery.  LOWER CHEST: Moderate right and small left pleural effusions with multi segment atelectasis.  Aortic valve replacement. There is mild cardiomegaly and extensive coronary atherosclerosis.  ABDOMEN/PELVIS:  Liver: Large liver, with the right lobe projecting to the iliac crest. Rounded masses in the right liver are near water density, excluding a 2 cm lesion in segment 6 which was likely not visualized/evaluated on sonography from 8 days ago.  Biliary: Cholecystectomy. There is suggestion of a choledochoenterostomy, although there is no pneumobilia.  Pancreas: Unremarkable.  Spleen: Unremarkable.  Adrenals: Unremarkable.  Kidneys and ureters: No hydronephrosis or stone.  Bladder: Decompressed by a Foley catheter.  Reproductive: Unremarkable for age.  Bowel: Distal gastrectomy. The anastomoses are patent and there is no surrounding inflammatory change. No pericecal inflammation. No bowel obstruction.  Retroperitoneum: No mass or adenopathy.   Peritoneum: Small perihepatic ascites, likely from volume overload.  Vascular: No acute abnormality.  Diffuse arterial calcification.  OSSEOUS: No acute abnormalities.  IMPRESSION: 1. No acute intra-abdominal findings. 2. Severe anasarca. Moderate right and small left pleural effusions. 3. A 2 cm lesion in segment 6 is indeterminate due to density. Other liver lesions appear cystic. 4. Ventral abdominal hernia containing nonobstructed small bowel. 5. Distal gastrectomy.   Electronically Signed   By: Marnee Spring M.D.   On: 01/06/2015 10:36   Dg Chest 2 View  01/05/2015   CLINICAL DATA:  Congestive heart failure shortness breath.  EXAM: CHEST  2 VIEW  COMPARISON:  Radiograph 12/27/2014  FINDINGS: Cardiac silhouette is enlarged. There is bilateral pleural effusions similar prior. There is mild central venous pulmonary congestion. Mild basilar atelectasis.  IMPRESSION: Bilateral pleural effusions and central venous pulmonary congestion. Findings similar to comparison exam.   Electronically Signed   By: Genevive Bi M.D.   On: 01/05/2015 17:11   Dg Chest 2 View  12/27/2014   CLINICAL DATA:  Chest pain for 1 day.  EXAM: CHEST  2 VIEW  COMPARISON:  12/20/2014.  FINDINGS: Stable enlarged cardiac silhouette. No significant change in bilateral pleural effusions. Post CABG changes are again demonstrated. Evidence of bilateral chronic rotator cuff tears. Left shoulder degenerative changes.  IMPRESSION: Stable cardiomegaly and bilateral pleural effusions.   Electronically Signed   By: Beckie Salts M.D.   On: 12/27/2014 15:47   Dg Chest 2 View  12/20/2014   CLINICAL DATA:  Congestive failure  EXAM: CHEST  2 VIEW  COMPARISON:  12/15/2014  FINDINGS: The cardiac shadow remains enlarged. A right-sided pleural effusion and right basilar atelectasis is again identified. Postsurgical changes are seen. The left lung shows a small effusion but stable. No acute bony abnormality is seen.  IMPRESSION: Bilateral effusions  right greater than left. Right basilar atelectasis is noted as well.   Electronically Signed   By: Alcide Clever M.D.   On: 12/20/2014 12:16   Ct Head Wo Contrast  01/10/2015   CLINICAL DATA:  Drowsiness  EXAM: CT HEAD WITHOUT CONTRAST  TECHNIQUE: Contiguous axial images were obtained from the base of the  skull through the vertex without intravenous contrast.  COMPARISON:  None.  FINDINGS: Bony calvarium is intact. Mild atrophic changes are seen. No findings to suggest acute hemorrhage, acute infarction or space-occupying mass lesion are identified.  IMPRESSION: Mild atrophic changes without acute abnormality.   Electronically Signed   By: Alcide Clever M.D.   On: 01/10/2015 12:35   Mr Brain Wo Contrast  01/12/2015   CLINICAL DATA:  Acute encephalopathy  EXAM: MRI HEAD WITHOUT CONTRAST  TECHNIQUE: Multiplanar, multiecho pulse sequences of the brain and surrounding structures were obtained without intravenous contrast.  COMPARISON:  CT head 01/10/2015  FINDINGS: Image quality degraded by mild to moderate motion.  Generalized atrophy. Negative for hydrocephalus. Pituitary normal in size  Negative for acute infarct  Chronic microvascular ischemic changes in the white matter are mild. No cortical infarct. No brainstem lesion. Cerebellum intact  Negative for intracranial hemorrhage  12 mm calcified mass over the convexity on the left compatible with meningioma. No edema in the adjacent brain.  IMPRESSION: Atrophy and mild chronic microvascular ischemia.  No acute infarct  12 mm calcified meningioma over the convexity on the left.   Electronically Signed   By: Marlan Palau M.D.   On: 01/12/2015 10:02   US Renal  12/29/2014   CLINICAL DATA:  Acute renal failure  EXAM: RENAL/URINARY TRACT ULTRASOUND COMPLETE  COMPARISON:  None.  FINDINGS: Right Kidney:  Length: 9.1 cm. Diffuse cortical thinning is noted. No focal mass lesion or hydronephrosis is noted.  Left Kidney:  Length: 10.2 cm. Echogenicity within normal  limits. No mass or hydronephrosis visualized.  Bladder:  Appears normal for degree of bladder distention.  A 3.2 cm hepatic cyst is noted. Small bilateral pleural effusions are noted.  IMPRESSION: Mild cortical thinning in the right kidney.  No mass lesion or hydronephrosis is noted.  Small bilateral pleural effusions.  Hepatic cyst.   Electronically Signed   By: Alcide Clever M.D.   On: 12/29/2014 11:28   Ir Fluoro Guide Cv Line Right  01/10/2015   CLINICAL DATA:  Multiple comorbidities including atrial fibrillation, hypertension, CHF, morbid obesity, poor peripheral access  EXAM: POWER PICC LINE PLACEMENT WITH ULTRASOUND AND FLUOROSCOPIC GUIDANCE  FLUOROSCOPY TIME:  30 seconds, 9 mGy  PROCEDURE: The patient was advised of the possible risks andcomplications and agreed to undergo the procedure. The patient was then brought to the angiographic suite for the procedure.  The right arm was prepped with chlorhexidine, drapedin the usual sterile fashion using maximum barrier technique (cap and mask, sterile gown, sterile gloves, large sterile sheet, hand hygiene and cutaneous antisepsis) and infiltrated locally with 1% Lidocaine.  Ultrasound demonstrated patency of the right brachial vein, and this was documented with an image. Under real-time ultrasound guidance, this vein was accessed with a 21 gauge micropuncture needle and image documentation was performed. A 0.018 wire was introduced in to the vein. Over this, a 5 [place] double lumen power PICC was advanced to the lower SVC/right atrial junction. Fluoroscopy during the procedure and fluoro spot radiograph confirms appropriate catheter position. The catheter was flushed and covered with asterile dressing.  Catheter length: 38  Complications: None  IMPRESSION: Successful right arm power PICC line placement with ultrasound and fluoroscopic guidance. The catheter is ready for use.   Electronically Signed   By: Judie Petit.  Shick M.D.   On: 01/10/2015 12:56   Ir US Guide Vasc  Access Right  01/10/2015   CLINICAL DATA:  Multiple comorbidities including atrial fibrillation, hypertension, CHF,  morbid obesity, poor peripheral access  EXAM: POWER PICC LINE PLACEMENT WITH ULTRASOUND AND FLUOROSCOPIC GUIDANCE  FLUOROSCOPY TIME:  30 seconds, 9 mGy  PROCEDURE: The patient was advised of the possible risks andcomplications and agreed to undergo the procedure. The patient was then brought to the angiographic suite for the procedure.  The right arm was prepped with chlorhexidine, drapedin the usual sterile fashion using maximum barrier technique (cap and mask, sterile gown, sterile gloves, large sterile sheet, hand hygiene and cutaneous antisepsis) and infiltrated locally with 1% Lidocaine.  Ultrasound demonstrated patency of the right brachial vein, and this was documented with an image. Under real-time ultrasound guidance, this vein was accessed with a 21 gauge micropuncture needle and image documentation was performed. A 0.018 wire was introduced in to the vein. Over this, a 5 [place] double lumen power PICC was advanced to the lower SVC/right atrial junction. Fluoroscopy during the procedure and fluoro spot radiograph confirms appropriate catheter position. The catheter was flushed and covered with asterile dressing.  Catheter length: 38  Complications: None  IMPRESSION: Successful right arm power PICC line placement with ultrasound and fluoroscopic guidance. The catheter is ready for use.   Electronically Signed   By: Judie Petit.  Shick M.D.   On: 01/10/2015 12:56   Dg Chest Port 1 View  01/07/2015   CLINICAL DATA:  Respiratory difficulty. Cough. Congestion. Cardiomegaly and CHF. No fever. No chest pain.  EXAM: PORTABLE CHEST - 1 VIEW  COMPARISON:  01/05/2015  FINDINGS: Heart is enlarged. There is moderate right pleural effusion associated right basilar atelectasis. Small left pleural effusion is present. There are changes of interstitial edema.  IMPRESSION: 1. Cardiomegaly and interstitial edema. 2.  Persistent bibasilar opacity, right greater than left.   Electronically Signed   By: Norva Pavlov M.D.   On: 01/07/2015 18:02    CBC  Recent Labs Lab 01/15/15 0401 01/16/15 0500  WBC 3.8* 3.8*  HGB 10.4* 10.6*  HCT 34.3* 34.5*  PLT 110* 110*  MCV 102.4* 102.7*  MCH 31.0 31.5  MCHC 30.3 30.7  RDW 17.4* 17.6*    Chemistries   Recent Labs Lab 01/10/15 0308 01/11/15 0630 01/12/15 0500 01/13/15 0500 01/14/15 0501 01/15/15 0401 01/16/15 0500  NA 140 141 144 144 141 143 139  K 4.2 3.7 3.6 3.6 3.5 3.5 3.5  CL 101 101 105 103 100* 97* 94*  CO2 29 30 32 32 33* 37* 35*  GLUCOSE 76 77 110* 105* 95 98 77  BUN 38* 39* 35* 33* 30* 26* 23*  CREATININE 1.34* 1.33* 1.37* 1.26* 1.14* 0.98 0.95  CALCIUM 8.9 8.8* 9.2 9.3 9.2 9.4 9.3  AST --   --   --   --   ALT --   --   --   --   ALKPHOS 68 68 75  --   --   --   --   BILITOT 0.8 0.8 0.8  --   --   --   --    ------------------------------------------------------------------------------------------------------------------ estimated creatinine clearance is 52.2 mL/min (by C-G formula based on Cr of 0.95). ------------------------------------------------------------------------------------------------------------------ No results for input(s): HGBA1C in the last 72 hours. ------------------------------------------------------------------------------------------------------------------ No results for input(s): CHOL, HDL, LDLCALC, TRIG, CHOLHDL, LDLDIRECT in the last 72 hours. ------------------------------------------------------------------------------------------------------------------ No results for input(s): TSH, T4TOTAL, T3FREE, THYROIDAB in the last 72 hours.  Invalid input(s): FREET3 ------------------------------------------------------------------------------------------------------------------ No results for input(s): VITAMINB12, FOLATE, FERRITIN, TIBC, IRON, RETICCTPCT in the last 72  hours.  Coagulation  profile No results for input(s): INR, PROTIME in the last 168 hours.  No results for input(s): DDIMER in the last 72 hours.  Cardiac Enzymes  Recent Labs Lab 01/09/15 1503  TROPONINI 0.03   ------------------------------------------------------------------------------------------------------------------ Invalid input(s): POCBNP   Recent Labs  01/14/15 1712 01/14/15 2205 01/15/15 0743 01/15/15 1604 01/15/15 2238 01/16/15 0721  GLUCAP 94 105* 79 78 80 79     Jeris Roser M.D. [facility] 01/16/2015, 9:53 AM  Pager: [phone]   Between 7am to 7pm - call Pager - [phone]  After 7pm go to [email or web address] - password [email or web address]  Call night coverage person covering after 7pm

## 2015-01-16 NOTE — Significant Event (Addendum)
Patient has been having large leakage of stools around flexiseal-watery stools, total output from tube 400cc for dayshift. Flexiseal was present at assessment this morning. RN flushed flexiseal, reinserted, added water to balloon, repositioning patient Q1h to Q2H and flattened flexiseal tubing to help with drainage. Stools leakage continues to be present, overfloating to foley catheter. Pericare and foley care done multiple times today. MD notified and made aware of stools frequency and request for consideration of lactulose reduction. 1800pm- RN removed flexiseal and placed in a rectal pouch. Patient skin intact at time of rectal pouch placement. RN checked pouch to ensure patency with incoming RN. Noted no leakage at the time.

## 2015-01-16 NOTE — Consult Note (Signed)
WOC wound consult note Reason for Consult: patient leg wraps, complaining.  However today when I arrived the wraps have been removed. Compression wraps ordered per CHF team.  Discussed with patient, they were removed due to strike through and weeping.  May need more frequent changes of the compression wraps as she diuresis and the edema subsides.  Wound type: venous stasis, weeping, no active ulcerations today Dressing procedure/placement/frequency: Supplies ordered and bedside nurse aware to page ortho tech when Coban arrives to the unit.  Updated Unna's boot order to change Q M/Th.   Discussed POC with patient and bedside nurse.  Re consult if needed, will not follow at this time. Thanks  Laquia Rosano [employer], CWOCN [phone])

## 2015-01-16 NOTE — Progress Notes (Signed)
Physical Therapy Treatment Patient Details Name: Melanie Camacho MRN: [id number] DOB: [birth year] Today's Date: 01/16/2015    History of Present Illness Pt is an 81 y./o female recently admitted to similar s/s of CHF, now back from SNF rehab with CHF.    PT Comments    Pt admitted with above diagnosis. Pt currently with functional limitations due to balance  And endurance deficits. Pt continues to progress slowly.  Able to stand for a few minutes today and take a few steps.  Pt will benefit from skilled PT to increase their independence and safety with mobility to allow discharge to the venue listed below.    Follow Up Recommendations  SNF     Equipment Recommendations  None recommended by PT    Recommendations for Other Services       Precautions / Restrictions Precautions Precautions: Fall Precaution Comments: very fearful of falling Restrictions Weight Bearing Restrictions: No    Mobility  Bed Mobility               General bed mobility comments: in chair  Transfers Overall transfer level: Needs assistance Equipment used: Rolling walker (2 wheeled) Transfers: Sit to/from Stand Sit to Stand: Mod assist;Max assist;+2 physical assistance;From elevated surface         General transfer comment: Assist for anterior lean and to power up.  did better with use of counting with use of momentum.  Took incr time for pt to reach full stand.  Cleaned pt as flexi seal is leaking with pt able to hold herself up.  Pt needed mod cues to move LEs but with incr time pt was able to take  afew steps forward.  Pt was able to reach back and control descent into chair.    Ambulation/Gait                 Stairs            Wheelchair Mobility    Modified Rankin (Stroke Patients Only)       Balance         Postural control: Posterior lean Standing balance support: Bilateral upper extremity supported;During functional activity Standing balance-Leahy Scale:  Poor Standing balance comment: pt required UE support for static balance                    Cognition Arousal/Alertness: Awake/alert Behavior During Therapy: Flat affect Overall Cognitive Status: Within Functional Limits for tasks assessed                      Exercises General Exercises - Lower Extremity Ankle Circles/Pumps: AROM;Both;10 reps;Supine Heel Slides: AROM;Both;10 reps;Supine    General Comments        Pertinent Vitals/Pain Pain Assessment: Faces Faces Pain Scale: Hurts little more Pain Location: all over, shingles Pain Descriptors / Indicators: Aching;Guarding;Grimacing Pain Intervention(s): Limited activity within patient's tolerance;Monitored during session;Repositioned  VSS    Home Living                      Prior Function            PT Goals (current goals can now be found in the care plan section) Progress towards PT goals: Progressing toward goals    Frequency  Min 2X/week    PT Plan Current plan remains appropriate    Co-evaluation             End of Session Equipment Utilized During Treatment: Gait  belt;Oxygen Activity Tolerance: Patient limited by fatigue Patient left: in chair;with call bell/phone within reach     Time: 0952-1017 PT Time Calculation (min) (ACUTE ONLY): 25 min  Charges:  $Therapeutic Activity: 8-22 mins $Self Care/Home Management: 8-22                    G CodesBerline Lopes 02-Feb-2015, 12:51 PM  Aleathia Purdy,PT Acute Rehabilitation [phone] (pager)

## 2015-01-16 NOTE — Progress Notes (Signed)
Orthopedic Tech Progress Note Patient Details:  Melanie Camacho [birth year] [id number]  Ortho Devices Type of Ortho Device: Roland Rack boot Ortho Device/Splint Location: (B) LE Ortho Device/Splint Interventions: Ordered, Application   Jennye Moccasin 01/16/2015, 4:05 PM

## 2015-01-16 NOTE — Progress Notes (Signed)
Primary cardiologist: Dr. Nicki Guadalajara  Seen for followup: Diastolic heart failure with RV dysfunction, atrial fibrillation  Subjective:    Fatigued. Now with acute shingles on back.  Sore. Denies dyspnea CVP 17-19. Continue to diurese. Renal function stable though BP soft.   Objective:   Temp:  [97.6 F (36.4 C)-98.3 F (36.8 C)] 98 F (36.7 C) (05/09 1555) Pulse Rate:  [70-114] 85 (05/09 1600) Resp:  [9-27] 18 (05/09 2000) BP: (86-103)/(45-74) 91/54 mmHg (05/09 2000) SpO2:  [90 %-100 %] 93 % (05/09 2000) Weight:  [99.9 kg (220 lb 3.8 oz)] 99.9 kg (220 lb 3.8 oz) (05/09 0500) Last BM Date: 01/16/15  Filed Weights   01/14/15 0400 01/15/15 0900 01/16/15 0500  Weight: 104.781 kg (231 lb) 103.2 kg (227 lb 8.2 oz) 99.9 kg (220 lb 3.8 oz)    Intake/Output Summary (Last 24 hours) at 01/16/15 2026 Last data filed at 01/16/15 1800  Gross per 24 hour  Intake    343 ml  Output   3300 ml  Net  -2957 ml    Telemetry: Atrial fibrillation.  Exam:  General: Obese woman, no distress.  Lungs: Mild crackles toward the bases.  Cardiac: Elevated JVP, irregularly irregular. 1/6 SEM   Back: Shingles rash on left scapula  Abdomen: Protuberant.  Extremities: Lower legs wrapped, chronic 2+ edema present up to the thighs.  Lab Results:  Basic Metabolic Panel:  Recent Labs Lab 01/14/15 0501 01/15/15 0401 01/16/15 0500  NA 141 143 139  K 3.5 3.5 3.5  CL 100* 97* 94*  CO2 33* 37* 35*  GLUCOSE 95 98 77  BUN 30* 26* 23*  CREATININE 1.14* 0.98 0.95  CALCIUM 9.2 9.4 9.3    Liver Function Tests:  Recent Labs Lab 01/10/15 0308 01/11/15 0630 01/12/15 0500  AST 31 26 25   ALT 14 15 14   ALKPHOS 68 68 75  BILITOT 0.8 0.8 0.8  PROT 6.4* 6.6 6.9  ALBUMIN 2.7* 2.6* 2.8*    CBC:  Recent Labs Lab 01/15/15 0401 01/16/15 0500  WBC 3.8* 3.8*  HGB 10.4* 10.6*  HCT 34.3* 34.5*  MCV 102.4* 102.7*  PLT 110* 110*    Cardiac Enzymes: No results for input(s): CKTOTAL,  CKMB, CKMBINDEX, TROPONINI in the last 168 hours.  Limited echocardiogram 01/11/2015: Study Conclusions  - Left ventricle: The cavity size was normal. There was mild concentric hypertrophy. Systolic function was mildly reduced. The estimated ejection fraction was in the range of 45% to 50%. Possible hypokinesis of the entireinferolateral myocardium. - Aortic valve: The AV is not visualized adequately to comment on. There was mild regurgitation. - Mitral valve: Calcified annulus. Moderate thickening. There was mild regurgitation. - Right ventricle: RV was not measured by appears dilated. Systolic function was moderately to severely reduced. - Pulmonary arteries: PA peak pressure: 46 mm Hg (S). - Pericardium, extracardiac: There was a left pleural effusion.  Impressions:  - The right ventricular systolic pressure was increased consistent with moderate pulmonary hypertension.    Medications:   Scheduled Medications: . antiseptic oral rinse  7 mL Mouth Rinse BID  . apixaban  5 mg Oral BID  . bisoprolol  2.5 mg Oral Daily  . colesevelam  1,875 mg Oral BID WC  . ferrous fumarate  1 tablet Oral Daily  . furosemide  80 mg Intravenous 3 times per day  . [START ON 01/17/2015] lactulose  20 g Oral TID  . levothyroxine  50 mcg Oral QAC breakfast  . metolazone  5  mg Oral BID  . multivitamin  2 tablet Oral Daily  . pantoprazole  40 mg Oral BID  . potassium chloride  40 mEq Oral BID  . sodium chloride  3 mL Intravenous Q12H  . valACYclovir  1,000 mg Oral TID     PRN Medications: sodium chloride, acetaminophen, gi cocktail, ondansetron (ZOFRAN) IV, oxyCODONE, phenazopyridine, sodium chloride   Assessment:   1. Acute on chronic diastolic HF R>>L with anasarca. Urine output has increased substantially on IV Lasix and metolazone. CVP 17-19. Keep it going. Renal function stable.   2. Acute on chronic RV failure with acute cor pulmonale. PASP 46 mmHg by echocardiogram.  3.  AoV disease s/p bioprosthetic AVR.  4. Morbid obesity.  5. Chronic AF.  6. Hypoalbuminemia- Albumin 2.7.  7. PAF - Eliquis. Mild nose bleed. Resolved.   8. Acute shingles - per primary team   Plan/Discussion:    Continue IV Lasix and metolazone with excellent diuresis. Ultimately will need right heart catheterization once volume status further optimized if tolerated.   Arvilla Meres, MD

## 2015-01-17 ENCOUNTER — Inpatient Hospital Stay (HOSPITAL_COMMUNITY): Payer: Medicare Other

## 2015-01-17 LAB — CARBOXYHEMOGLOBIN
CARBOXYHEMOGLOBIN: 1.6 % — AB (ref 0.5–1.5)
Methemoglobin: 0.9 % (ref 0.0–1.5)
O2 Saturation: 65.6 %
Total hemoglobin: 13.4 g/dL (ref 12.0–16.0)

## 2015-01-17 LAB — GLUCOSE, CAPILLARY
GLUCOSE-CAPILLARY: 69 mg/dL — AB (ref 70–99)
GLUCOSE-CAPILLARY: 87 mg/dL (ref 70–99)
Glucose-Capillary: 114 mg/dL — ABNORMAL HIGH (ref 70–99)
Glucose-Capillary: 76 mg/dL (ref 70–99)

## 2015-01-17 LAB — AMMONIA: AMMONIA: 33 umol/L (ref 9–35)

## 2015-01-17 LAB — BASIC METABOLIC PANEL
Anion gap: 8 (ref 5–15)
BUN: 24 mg/dL — AB (ref 6–20)
CALCIUM: 9.2 mg/dL (ref 8.9–10.3)
CO2: 38 mmol/L — ABNORMAL HIGH (ref 22–32)
Chloride: 94 mmol/L — ABNORMAL LOW (ref 101–111)
Creatinine, Ser: 1.05 mg/dL — ABNORMAL HIGH (ref 0.44–1.00)
GFR calc Af Amer: 57 mL/min — ABNORMAL LOW (ref >60)
GFR, EST NON AFRICAN AMERICAN: 49 mL/min — AB (ref >60)
GLUCOSE: 65 mg/dL — AB (ref 70–99)
POTASSIUM: 4 mmol/L (ref 3.5–5.1)
SODIUM: 140 mmol/L (ref 135–145)

## 2015-01-17 MED ORDER — SODIUM CHLORIDE 0.9 % IJ SOLN
10.0000 mL | Freq: Two times a day (BID) | INTRAMUSCULAR | Status: DC
  Administered 2015-01-17 – 2015-01-18 (×3): 10 mL
  Administered 2015-01-19: 20 mL
  Administered 2015-01-20 – 2015-01-24 (×4): 10 mL

## 2015-01-17 MED ORDER — LACTULOSE 10 GM/15ML PO SOLN
10.0000 g | Freq: Three times a day (TID) | ORAL | Status: DC
  Administered 2015-01-17 – 2015-01-27 (×31): 10 g via ORAL
  Filled 2015-01-17 (×33): qty 15

## 2015-01-17 MED ORDER — SODIUM CHLORIDE 0.9 % IJ SOLN
10.0000 mL | INTRAMUSCULAR | Status: DC | PRN
  Administered 2015-01-20 – 2015-01-21 (×2): 10 mL
  Filled 2015-01-17 (×2): qty 40

## 2015-01-17 MED ORDER — LACTULOSE 10 GM/15ML PO SOLN
10.0000 g | Freq: Two times a day (BID) | ORAL | Status: DC
  Filled 2015-01-17 (×2): qty 15

## 2015-01-17 MED ORDER — SODIUM CHLORIDE 0.9 % IJ SOLN
10.0000 mL | INTRAMUSCULAR | Status: DC | PRN
  Administered 2015-01-17: 10 mL

## 2015-01-17 NOTE — Progress Notes (Signed)
Assisted patient back to bed. IV team in to reinsert PICC line

## 2015-01-17 NOTE — Progress Notes (Signed)
[facility]                                                                              Patient Demographics  Melanie Camacho, is a 81 y.o. female, DOB - 1932-08-17, UJW:[id number]  Admit date - 01/05/2015   Admitting Physician Lorretta Harp, MD  Outpatient Primary MD for the patient is PROVIDER NOT IN SYSTEM  LOS - 12   Chief Complaint  Patient presents with  . Shortness of Breath       Brief HPI   Patient is a 81 year old female with hypertension, diabetes, diet controlled, DM, CAD, AVR, stage 3 CKD, afib on eliquis, presented on 4/28 for abdominal pain and shortness of breath.  She was evaluated with a CT abdomen and pelvis , which did not reveal any acute intra abdominal findings. There was severe anasarca with moderate right and small left pleural effusions. Patient was admitted for acute on chronic diastolic heart failure and started on IV lasix. From 4/30 and 5/1, patient was hypotensive, tachycardic and her lasix and BB was held. Cardiology and heart failure team were consulted for recommendations.  Patient was noted to be lethargic on 5/3, CT head was negative for acute intracranial pathology, ABG did not show acute hypercapnia, PCO2 of 50, ammonia level was elevated at 91 and started on lactulose 5/5 patient continued to be somnolent, MRI of the brain was obtained which was negative for acute infarct.. Medications were adjusted, discontinued Neurontin and Requip. Lactulose was increased 5/6, 5,7: Patient's mental status continues to improve, still very deconditioned, anasarca with fluid overload  5/8: Developed dermatomal rash on the left breast and radiating to the left back, started on Valtrex 5/10 frank diarrhea in the rectal tube, hence lactulose was decreased on 5/9, slightly confused at this morning, will recheck ammonia level,   Assessment & Plan    Principal Problem:  Acute on chronic diastolic CHF exacerbation with anasarca, acute on chronic RV  failure/cor pulmonale: - significant anasarca and volume overloaded, CHF team following, on Lasix and metolazone. - Excellent diuresis, negative balance of 20 Liters, weight down from 254-> 224lbs  Atrial fibrillation; Rate controlled, continue eliquis Epistaxis resolved  Acute encephalopathy:Significant improved from admission, likely  medication effect along with hepatic encephalopathy, hypothyroidism - CT head is negative, MRI of the brain  5/5 negative for acute CVA, atrophia and mild chronic vascular ischemia  - Discontinued Neurontin, Requip - Patient was having frank diarrhea, watery stools in the rectal tube, but decrease the lactulose yesterday, recheck ammonia level as she is somewhat slightly confused. May place on rifaximin mean if worsening mental status and lactulose not working.  - TSH 15.71, increased Synthroid to 50 MCG daily    Rash: left dermatomal on breast, axillary line and back, appears to be shingles - Continue Valtrex 1 g 3 times a day for 10 days, airborne and contact precautions   Hypothyroidism: Continue Synthroid, increased to 50 MCG daily, TSH was 7.7 on 4/8, now 15.71  Restless leg syndrome: Discontinued Requip and Neurontin due to mental status changes  Mild macrocytic anemia; Stable continue to monitor.   Hypotension: Improved. unclear etiology, afebrile,  no leukocytosis - UA is negative for infection. CXR on 4/30 did not show any consolidation.  Stage 3 CKD;  - Creatinine appears at baseline   Code Status: full code  Family Communication: No family member at bed side. All the imagings, test results discussed with the patient today   Disposition Plan: Not medically ready   Time Spent in minutes  25 minutes  Procedures  None  Consults    cardiology  DVT Prophylaxis  eliquis  Medications  Scheduled Meds: . antiseptic oral rinse  7 mL Mouth Rinse BID  . apixaban  5 mg Oral BID  . bisoprolol  2.5 mg Oral Daily  . colesevelam   1,875 mg Oral BID WC  . ferrous fumarate  1 tablet Oral Daily  . furosemide  80 mg Intravenous 3 times per day  . lactulose  10 g Oral TID  . levothyroxine  50 mcg Oral QAC breakfast  . metolazone  5 mg Oral BID  . multivitamin  2 tablet Oral Daily  . pantoprazole  40 mg Oral BID  . potassium chloride  40 mEq Oral BID  . sodium chloride  3 mL Intravenous Q12H  . valACYclovir  1,000 mg Oral TID   Continuous Infusions:  PRN Meds:.sodium chloride, acetaminophen, gi cocktail, ondansetron (ZOFRAN) IV, oxyCODONE, phenazopyridine, sodium chloride, sodium chloride   Antibiotics   Anti-infectives    Start     Dose/Rate Route Frequency Ordered Stop   01/15/15 1115  valACYclovir (VALTREX) tablet 1,000 mg     1,000 mg Oral 3 times daily 01/15/15 1114 01/25/15 0959        Subjective:   Melanie Camacho was seen and examined today.  alert and oriented although feeling weak and deconditioned. Shingles on the left breast and back, no weeping vesicles.  Patient denies any chest pain, shortness of breath, nausea, vomiting or any new weakness. Per RN, having frank diarrhea, watery stools, rectal tube in.   Objective:   Blood pressure 107/60, pulse 103, temperature 98.6 F (37 C), temperature source Oral, resp. rate 17, height 5\' 2"  (1.575 m), weight 101.9 kg (224 lb 10.4 oz), SpO2 98 %.  Wt Readings from Last 3 Encounters:  01/17/15 101.9 kg (224 lb 10.4 oz)  12/30/14 104.599 kg (230 lb 9.6 oz)  12/29/13 87.7 kg (193 lb 5.5 oz)     Intake/Output Summary (Last 24 hours) at 01/17/15 1056 Last data filed at 01/17/15 0630  Gross per 24 hour  Intake    230 ml  Output   3075 ml  Net  -2845 ml    Exam  Gen: Alert and oriented 3 NAD  HEENT:  PERRLA, EOMI  Neck: Supple,JVD +  CVS:  irregularly irregular  Resp: Clear to auscultation bilaterally, no wheezing  Abdomen: Morbidly obese,anasarca, NT, NBS  Ext: no c/c,  leg wraps bilaterally  neuro:  no new focal neurological  deficits  Skin: Rash on left dermatomal area under left breast, axillary line and radiating back to scapular region  Psych:   Alert and oriented 3, normal affect   Data Review   Micro Results Recent Results (from the past 240 hour(s))  Clostridium Difficile by PCR     Status: None   Collection Time: 01/12/15  4:44 PM  Result Value Ref Range Status   C difficile by pcr NEGATIVE NEGATIVE Final    Radiology Reports Ct Abdomen Pelvis Wo Contrast  01/06/2015   CLINICAL DATA:  Intermittent right lower quadrant pain for several days.  EXAM: CT ABDOMEN AND PELVIS WITHOUT CONTRAST  TECHNIQUE: Multidetector CT imaging of the abdomen and pelvis was performed following the standard protocol without IV contrast.  COMPARISON:  None.  FINDINGS: BODY WALL: Severe anasarca. The abdominal wall is incompletely imaged but there is no gross fluid collection. Lax abdominal wall with wide necked herniation below the umbilicus, containing nonobstructed small bowel. Muscular or fascia calcification in the abdominal wall which could be dystrophic or from previous surgery.  LOWER CHEST: Moderate right and small left pleural effusions with multi segment atelectasis.  Aortic valve replacement. There is mild cardiomegaly and extensive coronary atherosclerosis.  ABDOMEN/PELVIS:  Liver: Large liver, with the right lobe projecting to the iliac crest. Rounded masses in the right liver are near water density, excluding a 2 cm lesion in segment 6 which was likely not visualized/evaluated on sonography from 8 days ago.  Biliary: Cholecystectomy. There is suggestion of a choledochoenterostomy, although there is no pneumobilia.  Pancreas: Unremarkable.  Spleen: Unremarkable.  Adrenals: Unremarkable.  Kidneys and ureters: No hydronephrosis or stone.  Bladder: Decompressed by a Foley catheter.  Reproductive: Unremarkable for age.  Bowel: Distal gastrectomy. The anastomoses are patent and there is no surrounding inflammatory change. No  pericecal inflammation. No bowel obstruction.  Retroperitoneum: No mass or adenopathy.  Peritoneum: Small perihepatic ascites, likely from volume overload.  Vascular: No acute abnormality.  Diffuse arterial calcification.  OSSEOUS: No acute abnormalities.  IMPRESSION: 1. No acute intra-abdominal findings. 2. Severe anasarca. Moderate right and small left pleural effusions. 3. A 2 cm lesion in segment 6 is indeterminate due to density. Other liver lesions appear cystic. 4. Ventral abdominal hernia containing nonobstructed small bowel. 5. Distal gastrectomy.   Electronically Signed   By: Marnee Spring M.D.   On: 01/06/2015 10:36   Dg Chest 2 View  01/05/2015   CLINICAL DATA:  Congestive heart failure shortness breath.  EXAM: CHEST  2 VIEW  COMPARISON:  Radiograph 12/27/2014  FINDINGS: Cardiac silhouette is enlarged. There is bilateral pleural effusions similar prior. There is mild central venous pulmonary congestion. Mild basilar atelectasis.  IMPRESSION: Bilateral pleural effusions and central venous pulmonary congestion. Findings similar to comparison exam.   Electronically Signed   By: Genevive Bi M.D.   On: 01/05/2015 17:11   Dg Chest 2 View  12/27/2014   CLINICAL DATA:  Chest pain for 1 day.  EXAM: CHEST  2 VIEW  COMPARISON:  12/20/2014.  FINDINGS: Stable enlarged cardiac silhouette. No significant change in bilateral pleural effusions. Post CABG changes are again demonstrated. Evidence of bilateral chronic rotator cuff tears. Left shoulder degenerative changes.  IMPRESSION: Stable cardiomegaly and bilateral pleural effusions.   Electronically Signed   By: Beckie Salts M.D.   On: 12/27/2014 15:47   Dg Chest 2 View  12/20/2014   CLINICAL DATA:  Congestive failure  EXAM: CHEST  2 VIEW  COMPARISON:  12/15/2014  FINDINGS: The cardiac shadow remains enlarged. A right-sided pleural effusion and right basilar atelectasis is again identified. Postsurgical changes are seen. The left lung shows a small  effusion but stable. No acute bony abnormality is seen.  IMPRESSION: Bilateral effusions right greater than left. Right basilar atelectasis is noted as well.   Electronically Signed   By: Alcide Clever M.D.   On: 12/20/2014 12:16   Ct Head Wo Contrast  01/10/2015   CLINICAL DATA:  Drowsiness  EXAM: CT HEAD WITHOUT CONTRAST  TECHNIQUE: Contiguous axial images were obtained from the base of the skull through the vertex  without intravenous contrast.  COMPARISON:  None.  FINDINGS: Bony calvarium is intact. Mild atrophic changes are seen. No findings to suggest acute hemorrhage, acute infarction or space-occupying mass lesion are identified.  IMPRESSION: Mild atrophic changes without acute abnormality.   Electronically Signed   By: Alcide Clever M.D.   On: 01/10/2015 12:35   Mr Brain Wo Contrast  01/12/2015   CLINICAL DATA:  Acute encephalopathy  EXAM: MRI HEAD WITHOUT CONTRAST  TECHNIQUE: Multiplanar, multiecho pulse sequences of the brain and surrounding structures were obtained without intravenous contrast.  COMPARISON:  CT head 01/10/2015  FINDINGS: Image quality degraded by mild to moderate motion.  Generalized atrophy. Negative for hydrocephalus. Pituitary normal in size  Negative for acute infarct  Chronic microvascular ischemic changes in the white matter are mild. No cortical infarct. No brainstem lesion. Cerebellum intact  Negative for intracranial hemorrhage  12 mm calcified mass over the convexity on the left compatible with meningioma. No edema in the adjacent brain.  IMPRESSION: Atrophy and mild chronic microvascular ischemia.  No acute infarct  12 mm calcified meningioma over the convexity on the left.   Electronically Signed   By: Marlan Palau M.D.   On: 01/12/2015 10:02   US Renal  12/29/2014   CLINICAL DATA:  Acute renal failure  EXAM: RENAL/URINARY TRACT ULTRASOUND COMPLETE  COMPARISON:  None.  FINDINGS: Right Kidney:  Length: 9.1 cm. Diffuse cortical thinning is noted. No focal mass lesion or  hydronephrosis is noted.  Left Kidney:  Length: 10.2 cm. Echogenicity within normal limits. No mass or hydronephrosis visualized.  Bladder:  Appears normal for degree of bladder distention.  A 3.2 cm hepatic cyst is noted. Small bilateral pleural effusions are noted.  IMPRESSION: Mild cortical thinning in the right kidney.  No mass lesion or hydronephrosis is noted.  Small bilateral pleural effusions.  Hepatic cyst.   Electronically Signed   By: Alcide Clever M.D.   On: 12/29/2014 11:28   Ir Fluoro Guide Cv Line Right  01/10/2015   CLINICAL DATA:  Multiple comorbidities including atrial fibrillation, hypertension, CHF, morbid obesity, poor peripheral access  EXAM: POWER PICC LINE PLACEMENT WITH ULTRASOUND AND FLUOROSCOPIC GUIDANCE  FLUOROSCOPY TIME:  30 seconds, 9 mGy  PROCEDURE: The patient was advised of the possible risks andcomplications and agreed to undergo the procedure. The patient was then brought to the angiographic suite for the procedure.  The right arm was prepped with chlorhexidine, drapedin the usual sterile fashion using maximum barrier technique (cap and mask, sterile gown, sterile gloves, large sterile sheet, hand hygiene and cutaneous antisepsis) and infiltrated locally with 1% Lidocaine.  Ultrasound demonstrated patency of the right brachial vein, and this was documented with an image. Under real-time ultrasound guidance, this vein was accessed with a 21 gauge micropuncture needle and image documentation was performed. A 0.018 wire was introduced in to the vein. Over this, a 5 [place] double lumen power PICC was advanced to the lower SVC/right atrial junction. Fluoroscopy during the procedure and fluoro spot radiograph confirms appropriate catheter position. The catheter was flushed and covered with asterile dressing.  Catheter length: 38  Complications: None  IMPRESSION: Successful right arm power PICC line placement with ultrasound and fluoroscopic guidance. The catheter is ready for use.    Electronically Signed   By: Judie Petit.  Shick M.D.   On: 01/10/2015 12:56   Ir US Guide Vasc Access Right  01/10/2015   CLINICAL DATA:  Multiple comorbidities including atrial fibrillation, hypertension, CHF, morbid obesity, poor peripheral  access  EXAM: POWER PICC LINE PLACEMENT WITH ULTRASOUND AND FLUOROSCOPIC GUIDANCE  FLUOROSCOPY TIME:  30 seconds, 9 mGy  PROCEDURE: The patient was advised of the possible risks andcomplications and agreed to undergo the procedure. The patient was then brought to the angiographic suite for the procedure.  The right arm was prepped with chlorhexidine, drapedin the usual sterile fashion using maximum barrier technique (cap and mask, sterile gown, sterile gloves, large sterile sheet, hand hygiene and cutaneous antisepsis) and infiltrated locally with 1% Lidocaine.  Ultrasound demonstrated patency of the right brachial vein, and this was documented with an image. Under real-time ultrasound guidance, this vein was accessed with a 21 gauge micropuncture needle and image documentation was performed. A 0.018 wire was introduced in to the vein. Over this, a 5 [place] double lumen power PICC was advanced to the lower SVC/right atrial junction. Fluoroscopy during the procedure and fluoro spot radiograph confirms appropriate catheter position. The catheter was flushed and covered with asterile dressing.  Catheter length: 38  Complications: None  IMPRESSION: Successful right arm power PICC line placement with ultrasound and fluoroscopic guidance. The catheter is ready for use.   Electronically Signed   By: Judie Petit.  Shick M.D.   On: 01/10/2015 12:56   Dg Chest Port 1 View  01/07/2015   CLINICAL DATA:  Respiratory difficulty. Cough. Congestion. Cardiomegaly and CHF. No fever. No chest pain.  EXAM: PORTABLE CHEST - 1 VIEW  COMPARISON:  01/05/2015  FINDINGS: Heart is enlarged. There is moderate right pleural effusion associated right basilar atelectasis. Small left pleural effusion is present. There are  changes of interstitial edema.  IMPRESSION: 1. Cardiomegaly and interstitial edema. 2. Persistent bibasilar opacity, right greater than left.   Electronically Signed   By: Norva Pavlov M.D.   On: 01/07/2015 18:02    CBC  Recent Labs Lab 01/15/15 0401 01/16/15 0500  WBC 3.8* 3.8*  HGB 10.4* 10.6*  HCT 34.3* 34.5*  PLT 110* 110*  MCV 102.4* 102.7*  MCH 31.0 31.5  MCHC 30.3 30.7  RDW 17.4* 17.6*    Chemistries   Recent Labs Lab 01/11/15 0630 01/12/15 0500 01/13/15 0500 01/14/15 0501 01/15/15 0401 01/16/15 0500 01/17/15 0603  NA 141 144 144 141 143 139 140  K 3.7 3.6 3.6 3.5 3.5 3.5 4.0  CL 101 105 103 100* 97* 94* 94*  CO2 30 32 32 33* 37* 35* 38*  GLUCOSE 77 110* 105* 95 98 77 65*  BUN 39* 35* 33* 30* 26* 23* 24*  CREATININE 1.33* 1.37* 1.26* 1.14* 0.98 0.95 1.05*  CALCIUM 8.8* 9.2 9.3 9.2 9.4 9.3 9.2  AST 26 25  --   --   --   --   --   ALT 15 14  --   --   --   --   --   ALKPHOS 68 75  --   --   --   --   --   BILITOT 0.8 0.8  --   --   --   --   --    ------------------------------------------------------------------------------------------------------------------ estimated creatinine clearance is 47.8 mL/min (by C-G formula based on Cr of 1.05). ------------------------------------------------------------------------------------------------------------------ No results for input(s): HGBA1C in the last 72 hours. ------------------------------------------------------------------------------------------------------------------ No results for input(s): CHOL, HDL, LDLCALC, TRIG, CHOLHDL, LDLDIRECT in the last 72 hours. ------------------------------------------------------------------------------------------------------------------ No results for input(s): TSH, T4TOTAL, T3FREE, THYROIDAB in the last 72 hours.  Invalid input(s): FREET3 ------------------------------------------------------------------------------------------------------------------ No results for  input(s): VITAMINB12, FOLATE, FERRITIN, TIBC, IRON, RETICCTPCT in the last  72 hours.  Coagulation profile No results for input(s): INR, PROTIME in the last 168 hours.  No results for input(s): DDIMER in the last 72 hours.  Cardiac Enzymes No results for input(s): CKMB, TROPONINI, MYOGLOBIN in the last 168 hours.  Invalid input(s): CK ------------------------------------------------------------------------------------------------------------------ Invalid input(s): POCBNP   Recent Labs  01/16/15 0721 01/16/15 1150 01/16/15 1557 01/16/15 2158 01/17/15 0845 01/17/15 0922  GLUCAP 79 114* 85 96 69* 114*     RAI,RIPUDEEP M.D. [facility] 01/17/2015, 10:56 AM  Pager: [phone]   Between 7am to 7pm - call Pager - [phone]  After 7pm go to [email or web address] - password [email or web address]  Call night coverage person covering after 7pm

## 2015-01-17 NOTE — Progress Notes (Addendum)
Physical Therapy Treatment Patient Details Name: Melanie Camacho MRN: [id number] DOB: 06-08-1932 Today's Date: 01/17/2015    History of Present Illness Pt is an 81 y./o female recently admitted to similar s/s of CHF, now back from [facility] rehab with CHF.    PT Comments    Pt admitted with above diagnosis. Pt currently with functional limitations due to weakness, balance and endurance deficits. Pt progressing slowly.  Able to ambulate 5 feet with RW today. Slow gait.  Would benefit from OT consult.  Please order if you agree.   Pt will benefit from skilled PT to increase their independence and safety with mobility to allow discharge to the venue listed below.    Follow Up Recommendations  SNF     Equipment Recommendations  None recommended by PT    Recommendations for Other Services OT consult     Precautions / Restrictions Precautions: Airborne precautions for shingles Precautions: Fall Precaution Comments: very fearful of falling Restrictions Weight Bearing Restrictions: No    Mobility  Bed Mobility Overal bed mobility: Needs Assistance;+2 for physical assistance Bed Mobility: Rolling;Sidelying to Sit Rolling: Mod assist;+2 for physical assistance Sidelying to sit: +2 for physical assistance;Max assist Supine to sit: Max assist     General bed mobility comments: Pt needed a lot of assist.  Pt could not move LEs much at all.  Needed assist to initiate movement as well as to complete movement of LEs.  Used bed pad to assist hips to EOB.  Had to use pad to assist with elevation of trunk as well as she could not assist much with arms.    Transfers Overall transfer level: Needs assistance Equipment used: Rolling walker (2 wheeled) Transfers: Sit to/from Stand Sit to Stand: Mod assist;Max assist;+2 physical assistance;From elevated surface         General transfer comment: Assist for anterior lean and to power up.  did better with use of counting with use of momentum.  Took incr  time for pt to reach full stand.  Pt needed mod cues to move LEs but with incr time pt was able to take  several steps forward.  Pt was able to reach back and control descent into chair.    Ambulation/Gait Ambulation/Gait assistance: Min assist;+2 safety/equipment Ambulation Distance (Feet): 5 Feet Assistive device: Rolling walker (2 wheeled) Gait Pattern/deviations: Shuffle;Decreased step length - right;Decreased stride length;Antalgic;Wide base of support Gait velocity: decr Gait velocity interpretation: Below normal speed for age/gender General Gait Details: Takes incr time to ambulate with slow guarded steps.  Pt is able to weight shift on her own but takes incr time to move RW and LEs leaving right foot behind.  Also needs cues to stand tall as she keeps posture flexed.     Stairs            Wheelchair Mobility    Modified Rankin (Stroke Patients Only)       Balance Overall balance assessment: Needs assistance;History of Falls Sitting-balance support: Bilateral upper extremity supported;Feet supported Sitting balance-Leahy Scale: Poor Sitting balance - Comments: Can sit EOB with UE support once scooted out with pad. Postural control: Posterior lean Standing balance support: Bilateral upper extremity supported;During functional activity Standing balance-Leahy Scale: Poor Standing balance comment: pt requires use of UEs for balance.                      Cognition Arousal/Alertness: Awake/alert Behavior During Therapy: Flat affect Overall Cognitive Status: Within Functional Limits for tasks  assessed                      Exercises General Exercises - Upper Extremity Shoulder Flexion: AAROM;Both;10 reps;Seated Elbow Flexion: AAROM;Both;5 reps;Seated General Exercises - Lower Extremity Ankle Circles/Pumps: AROM;Both;10 reps;Supine Heel Slides: AAROM;Both;10 reps;Seated    General Comments        Pertinent Vitals/Pain Pain Assessment: Faces Faces  Pain Scale: Hurts even more Pain Location: all over, shingles Pain Descriptors / Indicators: Aching;Grimacing;Guarding Pain Intervention(s): Limited activity within patient's tolerance;Monitored during session;RepositionedHR 96-110 bpm, 98/44 initial BP, 94/68 once in chair. 96% O2 on 4L.      Home Living                      Prior Function            PT Goals (current goals can now be found in the care plan section) Progress towards PT goals: Progressing toward goals    Frequency  Min 2X/week    PT Plan Current plan remains appropriate    Co-evaluation             End of Session Equipment Utilized During Treatment: Gait belt;Oxygen Activity Tolerance: Patient limited by fatigue Patient left: in chair;with call bell/phone within reach     Time: 0934-0958 PT Time Calculation (min) (ACUTE ONLY): 24 min  Charges:  $Gait Training: 8-22 mins $Therapeutic Exercise: 8-22 mins                    G CodesTawni Millers F 02/07/2015, 10:58 AM [facility] [phone] (pager)

## 2015-01-17 NOTE — Progress Notes (Signed)
Primary cardiologist: Dr. Nicki Guadalajara  Seen for followup: Diastolic heart failure with RV dysfunction, atrial fibrillation  Subjective:    Getting PICC replaced this am after she accidentally pulled it back. Still with pain from shingles. Continues to diurese. Lactulose increased due to worsening lethargy.   Objective:   Temp:  [97.9 F (36.6 C)-98.6 F (37 C)] 98.6 F (37 C) (05/10 0800) Pulse Rate:  [70-108] 103 (05/10 0800) Resp:  [10-24] 17 (05/10 0800) BP: (79-107)/(36-74) 107/60 mmHg (05/10 0851) SpO2:  [90 %-100 %] 98 % (05/10 0800) Weight:  [101.9 kg (224 lb 10.4 oz)] 101.9 kg (224 lb 10.4 oz) (05/10 0500) Last BM Date: 01/16/15  Filed Weights   01/15/15 0900 01/16/15 0500 01/17/15 0500  Weight: 103.2 kg (227 lb 8.2 oz) 99.9 kg (220 lb 3.8 oz) 101.9 kg (224 lb 10.4 oz)    Intake/Output Summary (Last 24 hours) at 01/17/15 1106 Last data filed at 01/17/15 0630  Gross per 24 hour  Intake    230 ml  Output   2900 ml  Net  -2670 ml    Telemetry: Atrial fibrillation.  Exam:  General: Obese woman, lying in bed getting PICC replaced  Lungs: Mild crackles toward the bases.  Cardiac: Elevated JVP, irregularly irregular. 1/6 SEM   Back: Shingles rash on left scapula  Abdomen: Protuberant.  Extremities: Lower legs wrapped, chronic 2+ edema present up to the thighs.  Lab Results:  Basic Metabolic Panel:  Recent Labs Lab 01/15/15 0401 01/16/15 0500 01/17/15 0603  NA 143 139 140  K 3.5 3.5 4.0  CL 97* 94* 94*  CO2 37* 35* 38*  GLUCOSE 98 77 65*  BUN 26* 23* 24*  CREATININE 0.98 0.95 1.05*  CALCIUM 9.4 9.3 9.2    Liver Function Tests:  Recent Labs Lab 01/11/15 0630 01/12/15 0500  AST 26 25  ALT 15 14  ALKPHOS 68 75  BILITOT 0.8 0.8  PROT 6.6 6.9  ALBUMIN 2.6* 2.8*    CBC:  Recent Labs Lab 01/15/15 0401 01/16/15 0500  WBC 3.8* 3.8*  HGB 10.4* 10.6*  HCT 34.3* 34.5*  MCV 102.4* 102.7*  PLT 110* 110*    Cardiac Enzymes: No  results for input(s): CKTOTAL, CKMB, CKMBINDEX, TROPONINI in the last 168 hours.  Limited echocardiogram 01/11/2015: Study Conclusions  - Left ventricle: The cavity size was normal. There was mild concentric hypertrophy. Systolic function was mildly reduced. The estimated ejection fraction was in the range of 45% to 50%. Possible hypokinesis of the entireinferolateral myocardium. - Aortic valve: The AV is not visualized adequately to comment on. There was mild regurgitation. - Mitral valve: Calcified annulus. Moderate thickening. There was mild regurgitation. - Right ventricle: RV was not measured by appears dilated. Systolic function was moderately to severely reduced. - Pulmonary arteries: PA peak pressure: 46 mm Hg (S). - Pericardium, extracardiac: There was a left pleural effusion.  Impressions:  - The right ventricular systolic pressure was increased consistent with moderate pulmonary hypertension.    Medications:   Scheduled Medications: . antiseptic oral rinse  7 mL Mouth Rinse BID  . apixaban  5 mg Oral BID  . bisoprolol  2.5 mg Oral Daily  . colesevelam  1,875 mg Oral BID WC  . ferrous fumarate  1 tablet Oral Daily  . furosemide  80 mg Intravenous 3 times per day  . lactulose  10 g Oral TID  . levothyroxine  50 mcg Oral QAC breakfast  . metolazone  5 mg Oral  BID  . multivitamin  2 tablet Oral Daily  . pantoprazole  40 mg Oral BID  . potassium chloride  40 mEq Oral BID  . sodium chloride  3 mL Intravenous Q12H  . valACYclovir  1,000 mg Oral TID     PRN Medications: sodium chloride, acetaminophen, gi cocktail, ondansetron (ZOFRAN) IV, oxyCODONE, phenazopyridine, sodium chloride, sodium chloride   Assessment:   1. Acute on chronic diastolic HF R>>L with anasarca. Urine output has increased substantially on IV Lasix and metolazone. CVP 17-19. Keep it going. Renal function stable.   2. Acute on chronic RV failure with acute cor pulmonale. PASP 46  mmHg by echocardiogram.  3. AoV disease s/p bioprosthetic AVR.  4. Morbid obesity.  5. Chronic AF.  6. Hypoalbuminemia- Albumin 2.7.  7. PAF - Eliquis. Mild nose bleed. Resolved.   8. Acute shingles - per primary team   Plan/Discussion:    Still with volume on board. Continue IV Lasix and metolazone. Ideally will need right heart catheterization once volume status further optimized if tolerated. Agree with increasing lactulose as needed.   Arvilla Meres, MD

## 2015-01-17 NOTE — Progress Notes (Signed)
In room for cvp monitoring. Pt pulled PICC line half way out. Iv team called for assessment.

## 2015-01-17 NOTE — Progress Notes (Signed)
Dr. [facility] Broom advised of b/p 95/46. Ok to hold zaroxolyn

## 2015-01-17 NOTE — Progress Notes (Signed)
Spoke with Dr. Isidoro Donning about PICC tip not being in lower SVC  . Ok to pull PICC to midline .

## 2015-01-17 NOTE — Progress Notes (Signed)
PICC line pulled out to 12 cm by the patient, the cath remained intact. Vaseline pressure gauze to site, pressure held x . No bleeding to site. I informed the [facility] RN that the MD needs to be notified about the pt removing the PICC and if he/she wants another PICC a new order needs to be placed.  Consuello Masse

## 2015-01-18 LAB — BASIC METABOLIC PANEL
Anion gap: 12 (ref 5–15)
BUN: 24 mg/dL — ABNORMAL HIGH (ref 6–20)
CALCIUM: 8.9 mg/dL (ref 8.9–10.3)
CO2: 32 mmol/L (ref 22–32)
CREATININE: 1.01 mg/dL — AB (ref 0.44–1.00)
Chloride: 94 mmol/L — ABNORMAL LOW (ref 101–111)
GFR calc Af Amer: 59 mL/min — ABNORMAL LOW (ref >60)
GFR, EST NON AFRICAN AMERICAN: 51 mL/min — AB (ref >60)
GLUCOSE: 75 mg/dL (ref 70–99)
POTASSIUM: 4.4 mmol/L (ref 3.5–5.1)
Sodium: 138 mmol/L (ref 135–145)

## 2015-01-18 LAB — GLUCOSE, CAPILLARY
GLUCOSE-CAPILLARY: 103 mg/dL — AB (ref 70–99)
GLUCOSE-CAPILLARY: 107 mg/dL — AB (ref 70–99)
Glucose-Capillary: 86 mg/dL (ref 70–99)
Glucose-Capillary: 97 mg/dL (ref 70–99)
Glucose-Capillary: 103 mg/dL — ABNORMAL HIGH (ref 70–99)

## 2015-01-18 MED ORDER — GABAPENTIN 100 MG PO CAPS
100.0000 mg | ORAL_CAPSULE | Freq: Three times a day (TID) | ORAL | Status: DC
  Administered 2015-01-18 – 2015-01-27 (×29): 100 mg via ORAL
  Filled 2015-01-18 (×30): qty 1

## 2015-01-18 NOTE — Progress Notes (Signed)
[facility]                                                                              Patient Demographics  Melanie Camacho, is a 81 y.o. female, DOB - [birth year], GUY:[id number]  Admit date - 01/05/2015   Admitting Physician Lorretta Harp, MD  Outpatient Primary MD for the patient is PROVIDER NOT IN SYSTEM  LOS - 13   Chief Complaint  Patient presents with  . Shortness of Breath       Brief HPI   Patient is a 81 year old female with hypertension, diabetes, diet controlled, DM, CAD, AVR, stage 3 CKD, afib on eliquis, presented on 4/28 for abdominal pain and shortness of breath.  She was evaluated with a CT abdomen and pelvis , which did not reveal any acute intra abdominal findings. There was severe anasarca with moderate right and small left pleural effusions. Patient was admitted for acute on chronic diastolic heart failure and started on IV lasix. From 4/30 and 5/1, patient was hypotensive, tachycardic and her lasix and BB was held. Cardiology and heart failure team were consulted for recommendations.  Patient was noted to be lethargic on 5/3, CT head was negative for acute intracranial pathology, ABG did not show acute hypercapnia, PCO2 of 50, ammonia level was elevated at 91 and started on lactulose 5/5 patient continued to be somnolent, MRI of the brain was obtained which was negative for acute infarct.. Medications were adjusted, discontinued Neurontin and Requip. Lactulose was increased 5/6, 5,7: Patient's mental status continues to improve, still very deconditioned, anasarca with fluid overload  5/8: Developed dermatomal rash on the left breast and radiating to the left back, started on Valtrex 5/10 frank diarrhea in the rectal tube, hence lactulose was decreased on 5/9, slightly confused at this morning, will recheck ammonia level,   Assessment & Plan    Principal Problem:  Acute on chronic diastolic CHF exacerbation with anasarca, acute on chronic RV  failure/cor pulmonale: - significant anasarca and volume overloaded, CHF team following, on Lasix and metolazone. - Still diuresing, negative balance of 22 Liters, weight down from 254-> 220lbs  Atrial fibrillation; Rate controlled, continue eliquis Epistaxis resolved  Acute encephalopathy:Significant improved from admission, likely  medication effect along with hepatic encephalopathy, hypothyroidism - CT head is negative, MRI of the brain  5/5 negative for acute CVA, atrophia and mild chronic vascular ischemia  - Discontinued Neurontin, Requip - Continue current lactulose dose, patient is now alert and oriented  - TSH 15.71, increased Synthroid to 50 MCG daily    Rash: left dermatomal on breast, axillary line and back, appears to be shingles - Continue Valtrex 1 g 3 times a day for 10 days, airborne and contact precautions, may need to extend Valtrex to 14 days, shingles rash has not crusted yet - Complaining of pain from the rash, Neurontin will help, will restart Neurontin at 100 mg TID (she was on 300 twice a day and I discontinued it due to her acute encephalopathy.)  Hypothyroidism: Continue Synthroid, increased to 50 MCG daily, TSH was 7.7 on 4/8, now 15.71  Restless leg syndrome: Discontinued Requip due to mental status  changes, will restart Neurontin   Mild macrocytic anemia; Stable continue to monitor.   Hypotension: Improved. unclear etiology, afebrile, no leukocytosis - UA is negative for infection. CXR on 4/30 did not show any consolidation.  Stage 3 CKD;  - Creatinine appears at baseline   Code Status: full code  Family Communication: No family member at bed side. All the imagings, test results discussed with the patient today   Disposition Plan: Not medically ready   Time Spent in minutes  25 minutes  Procedures  None  Consults    cardiology  DVT Prophylaxis  eliquis  Medications  Scheduled Meds: . antiseptic oral rinse  7 mL Mouth Rinse BID  .  apixaban  5 mg Oral BID  . bisoprolol  2.5 mg Oral Daily  . colesevelam  1,875 mg Oral BID WC  . ferrous fumarate  1 tablet Oral Daily  . furosemide  80 mg Intravenous 3 times per day  . lactulose  10 g Oral TID  . levothyroxine  50 mcg Oral QAC breakfast  . metolazone  5 mg Oral BID  . multivitamin  2 tablet Oral Daily  . pantoprazole  40 mg Oral BID  . potassium chloride  40 mEq Oral BID  . sodium chloride  10-40 mL Intracatheter Q12H  . sodium chloride  3 mL Intravenous Q12H  . valACYclovir  1,000 mg Oral TID   Continuous Infusions:  PRN Meds:.sodium chloride, acetaminophen, gi cocktail, ondansetron (ZOFRAN) IV, oxyCODONE, phenazopyridine, sodium chloride, sodium chloride, sodium chloride   Antibiotics   Anti-infectives    Start     Dose/Rate Route Frequency Ordered Stop   01/15/15 1115  valACYclovir (VALTREX) tablet 1,000 mg     1,000 mg Oral 3 times daily 01/15/15 1114 01/25/15 0959        Subjective:   Melanie Camacho was seen and examined today. Alert and oriented, complaining of pain from the shingles otherwise no acute issues. Shingles rash has not crusted yet.  Patient denies any chest pain, shortness of breath, nausea, vomiting or any new weakness.    Objective:   Blood pressure 96/41, pulse 46, temperature 97.6 F (36.4 C), temperature source Oral, resp. rate 23, height 5\' 2"  (1.575 m), weight 100.2 kg (220 lb 14.4 oz), SpO2 100 %.  Wt Readings from Last 3 Encounters:  01/18/15 100.2 kg (220 lb 14.4 oz)  12/30/14 104.599 kg (230 lb 9.6 oz)  12/29/13 87.7 kg (193 lb 5.5 oz)     Intake/Output Summary (Last 24 hours) at 01/18/15 1034 Last data filed at 01/18/15 0800  Gross per 24 hour  Intake    603 ml  Output   2700 ml  Net  -2097 ml    Exam  Gen: Alert and oriented 3 NAD  HEENT:  PERRLA, EOMI  Neck: Supple,JVD +  CVS:  irregularly irregular  Resp: CTAB  Abdomen: Morbidly obese,anasarca, NT, NBS  Ext: no c/c, both leg wraps  bilaterally  neuro:  no new focal neurological deficits  Skin: Rash on left dermatomal area under left breast, axillary line and radiating back to scapular region, no crusting yet  Psych:   Alert and oriented 3, normal affect   Data Review   Micro Results Recent Results (from the past 240 hour(s))  Clostridium Difficile by PCR     Status: None   Collection Time: 01/12/15  4:44 PM  Result Value Ref Range Status   C difficile by pcr NEGATIVE NEGATIVE Final    Radiology Reports  Ct Abdomen Pelvis Wo Contrast  01/06/2015   CLINICAL DATA:  Intermittent right lower quadrant pain for several days.  EXAM: CT ABDOMEN AND PELVIS WITHOUT CONTRAST  TECHNIQUE: Multidetector CT imaging of the abdomen and pelvis was performed following the standard protocol without IV contrast.  COMPARISON:  None.  FINDINGS: BODY WALL: Severe anasarca. The abdominal wall is incompletely imaged but there is no gross fluid collection. Lax abdominal wall with wide necked herniation below the umbilicus, containing nonobstructed small bowel. Muscular or fascia calcification in the abdominal wall which could be dystrophic or from previous surgery.  LOWER CHEST: Moderate right and small left pleural effusions with multi segment atelectasis.  Aortic valve replacement. There is mild cardiomegaly and extensive coronary atherosclerosis.  ABDOMEN/PELVIS:  Liver: Large liver, with the right lobe projecting to the iliac crest. Rounded masses in the right liver are near water density, excluding a 2 cm lesion in segment 6 which was likely not visualized/evaluated on sonography from 8 days ago.  Biliary: Cholecystectomy. There is suggestion of a choledochoenterostomy, although there is no pneumobilia.  Pancreas: Unremarkable.  Spleen: Unremarkable.  Adrenals: Unremarkable.  Kidneys and ureters: No hydronephrosis or stone.  Bladder: Decompressed by a Foley catheter.  Reproductive: Unremarkable for age.  Bowel: Distal gastrectomy. The  anastomoses are patent and there is no surrounding inflammatory change. No pericecal inflammation. No bowel obstruction.  Retroperitoneum: No mass or adenopathy.  Peritoneum: Small perihepatic ascites, likely from volume overload.  Vascular: No acute abnormality.  Diffuse arterial calcification.  OSSEOUS: No acute abnormalities.  IMPRESSION: 1. No acute intra-abdominal findings. 2. Severe anasarca. Moderate right and small left pleural effusions. 3. A 2 cm lesion in segment 6 is indeterminate due to density. Other liver lesions appear cystic. 4. Ventral abdominal hernia containing nonobstructed small bowel. 5. Distal gastrectomy.   Electronically Signed   By: Marnee Spring M.D.   On: 01/06/2015 10:36   Dg Chest 2 View  01/05/2015   CLINICAL DATA:  Congestive heart failure shortness breath.  EXAM: CHEST  2 VIEW  COMPARISON:  Radiograph 12/27/2014  FINDINGS: Cardiac silhouette is enlarged. There is bilateral pleural effusions similar prior. There is mild central venous pulmonary congestion. Mild basilar atelectasis.  IMPRESSION: Bilateral pleural effusions and central venous pulmonary congestion. Findings similar to comparison exam.   Electronically Signed   By: Genevive Bi M.D.   On: 01/05/2015 17:11   Dg Chest 2 View  12/27/2014   CLINICAL DATA:  Chest pain for 1 day.  EXAM: CHEST  2 VIEW  COMPARISON:  12/20/2014.  FINDINGS: Stable enlarged cardiac silhouette. No significant change in bilateral pleural effusions. Post CABG changes are again demonstrated. Evidence of bilateral chronic rotator cuff tears. Left shoulder degenerative changes.  IMPRESSION: Stable cardiomegaly and bilateral pleural effusions.   Electronically Signed   By: Beckie Salts M.D.   On: 12/27/2014 15:47   Dg Chest 2 View  12/20/2014   CLINICAL DATA:  Congestive failure  EXAM: CHEST  2 VIEW  COMPARISON:  12/15/2014  FINDINGS: The cardiac shadow remains enlarged. A right-sided pleural effusion and right basilar atelectasis is again  identified. Postsurgical changes are seen. The left lung shows a small effusion but stable. No acute bony abnormality is seen.  IMPRESSION: Bilateral effusions right greater than left. Right basilar atelectasis is noted as well.   Electronically Signed   By: Alcide Clever M.D.   On: 12/20/2014 12:16   Ct Head Wo Contrast  01/10/2015   CLINICAL DATA:  Drowsiness  EXAM: CT HEAD WITHOUT CONTRAST  TECHNIQUE: Contiguous axial images were obtained from the base of the skull through the vertex without intravenous contrast.  COMPARISON:  None.  FINDINGS: Bony calvarium is intact. Mild atrophic changes are seen. No findings to suggest acute hemorrhage, acute infarction or space-occupying mass lesion are identified.  IMPRESSION: Mild atrophic changes without acute abnormality.   Electronically Signed   By: Alcide Clever M.D.   On: 01/10/2015 12:35   Mr Brain Wo Contrast  01/12/2015   CLINICAL DATA:  Acute encephalopathy  EXAM: MRI HEAD WITHOUT CONTRAST  TECHNIQUE: Multiplanar, multiecho pulse sequences of the brain and surrounding structures were obtained without intravenous contrast.  COMPARISON:  CT head 01/10/2015  FINDINGS: Image quality degraded by mild to moderate motion.  Generalized atrophy. Negative for hydrocephalus. Pituitary normal in size  Negative for acute infarct  Chronic microvascular ischemic changes in the white matter are mild. No cortical infarct. No brainstem lesion. Cerebellum intact  Negative for intracranial hemorrhage  12 mm calcified mass over the convexity on the left compatible with meningioma. No edema in the adjacent brain.  IMPRESSION: Atrophy and mild chronic microvascular ischemia.  No acute infarct  12 mm calcified meningioma over the convexity on the left.   Electronically Signed   By: Marlan Palau M.D.   On: 01/12/2015 10:02   US Renal  12/29/2014   CLINICAL DATA:  Acute renal failure  EXAM: RENAL/URINARY TRACT ULTRASOUND COMPLETE  COMPARISON:  None.  FINDINGS: Right Kidney:   Length: 9.1 cm. Diffuse cortical thinning is noted. No focal mass lesion or hydronephrosis is noted.  Left Kidney:  Length: 10.2 cm. Echogenicity within normal limits. No mass or hydronephrosis visualized.  Bladder:  Appears normal for degree of bladder distention.  A 3.2 cm hepatic cyst is noted. Small bilateral pleural effusions are noted.  IMPRESSION: Mild cortical thinning in the right kidney.  No mass lesion or hydronephrosis is noted.  Small bilateral pleural effusions.  Hepatic cyst.   Electronically Signed   By: Alcide Clever M.D.   On: 12/29/2014 11:28   Ir Fluoro Guide Cv Line Right  01/10/2015   CLINICAL DATA:  Multiple comorbidities including atrial fibrillation, hypertension, CHF, morbid obesity, poor peripheral access  EXAM: POWER PICC LINE PLACEMENT WITH ULTRASOUND AND FLUOROSCOPIC GUIDANCE  FLUOROSCOPY TIME:  30 seconds, 9 mGy  PROCEDURE: The patient was advised of the possible risks andcomplications and agreed to undergo the procedure. The patient was then brought to the angiographic suite for the procedure.  The right arm was prepped with chlorhexidine, drapedin the usual sterile fashion using maximum barrier technique (cap and mask, sterile gown, sterile gloves, large sterile sheet, hand hygiene and cutaneous antisepsis) and infiltrated locally with 1% Lidocaine.  Ultrasound demonstrated patency of the right brachial vein, and this was documented with an image. Under real-time ultrasound guidance, this vein was accessed with a 21 gauge micropuncture needle and image documentation was performed. A 0.018 wire was introduced in to the vein. Over this, a 5 [place] double lumen power PICC was advanced to the lower SVC/right atrial junction. Fluoroscopy during the procedure and fluoro spot radiograph confirms appropriate catheter position. The catheter was flushed and covered with asterile dressing.  Catheter length: 38  Complications: None  IMPRESSION: Successful right arm power PICC line placement  with ultrasound and fluoroscopic guidance. The catheter is ready for use.   Electronically Signed   By: Judie Petit.  Shick M.D.   On: 01/10/2015 12:56   Ir US Guide  Vasc Access Right  01/10/2015   CLINICAL DATA:  Multiple comorbidities including atrial fibrillation, hypertension, CHF, morbid obesity, poor peripheral access  EXAM: POWER PICC LINE PLACEMENT WITH ULTRASOUND AND FLUOROSCOPIC GUIDANCE  FLUOROSCOPY TIME:  30 seconds, 9 mGy  PROCEDURE: The patient was advised of the possible risks andcomplications and agreed to undergo the procedure. The patient was then brought to the angiographic suite for the procedure.  The right arm was prepped with chlorhexidine, drapedin the usual sterile fashion using maximum barrier technique (cap and mask, sterile gown, sterile gloves, large sterile sheet, hand hygiene and cutaneous antisepsis) and infiltrated locally with 1% Lidocaine.  Ultrasound demonstrated patency of the right brachial vein, and this was documented with an image. Under real-time ultrasound guidance, this vein was accessed with a 21 gauge micropuncture needle and image documentation was performed. A 0.018 wire was introduced in to the vein. Over this, a 5 [place] double lumen power PICC was advanced to the lower SVC/right atrial junction. Fluoroscopy during the procedure and fluoro spot radiograph confirms appropriate catheter position. The catheter was flushed and covered with asterile dressing.  Catheter length: 38  Complications: None  IMPRESSION: Successful right arm power PICC line placement with ultrasound and fluoroscopic guidance. The catheter is ready for use.   Electronically Signed   By: Judie Petit.  Shick M.D.   On: 01/10/2015 12:56   Dg Chest Port 1 View  01/17/2015   CLINICAL DATA:  Status post central line placement.  EXAM: PORTABLE CHEST - 1 VIEW  COMPARISON:  January 07, 2015.  FINDINGS: Stable cardiomegaly. Stable mild central pulmonary vascular congestion is noted. Interval placement of right-sided PICC  line with distal tip overlying expected position of right brachycephalic vein. No pneumothorax is noted. Bilateral pleural effusions are noted. Right midlung opacity is noted consistent with subsegmental atelectasis or possibly pneumonia.  IMPRESSION: Interval placement of right-sided PICC line with distal tip overlying expected position of right brachycephalic vein. Bilateral pleural effusions are again noted with right midlung opacity concerning for subsegmental atelectasis or possibly pneumonia.   Electronically Signed   By: Lupita Raider, M.D.   On: 01/17/2015 12:25   Dg Chest Port 1 View  01/07/2015   CLINICAL DATA:  Respiratory difficulty. Cough. Congestion. Cardiomegaly and CHF. No fever. No chest pain.  EXAM: PORTABLE CHEST - 1 VIEW  COMPARISON:  01/05/2015  FINDINGS: Heart is enlarged. There is moderate right pleural effusion associated right basilar atelectasis. Small left pleural effusion is present. There are changes of interstitial edema.  IMPRESSION: 1. Cardiomegaly and interstitial edema. 2. Persistent bibasilar opacity, right greater than left.   Electronically Signed   By: Norva Pavlov M.D.   On: 01/07/2015 18:02    CBC  Recent Labs Lab 01/15/15 0401 01/16/15 0500  WBC 3.8* 3.8*  HGB 10.4* 10.6*  HCT 34.3* 34.5*  PLT 110* 110*  MCV 102.4* 102.7*  MCH 31.0 31.5  MCHC 30.3 30.7  RDW 17.4* 17.6*    Chemistries   Recent Labs Lab 01/12/15 0500  01/14/15 0501 01/15/15 0401 01/16/15 0500 01/17/15 0603 01/18/15 0306  NA 144  < > 141 143 139 140 138  K 3.6  < > 3.5 3.5 3.5 4.0 4.4  CL 105  < > 100* 97* 94* 94* 94*  CO2 32  < > 33* 37* 35* 38* 32  GLUCOSE 110*  < > 95 98 77 65* 75  BUN 35*  < > 30* 26* 23* 24* 24*  CREATININE 1.37*  < >  1.14* 0.98 0.95 1.05* 1.01*  CALCIUM 9.2  < > 9.2 9.4 9.3 9.2 8.9  AST 25  --   --   --   --   --   --   ALT 14  --   --   --   --   --   --   ALKPHOS 75  --   --   --   --   --   --   BILITOT 0.8  --   --   --   --   --   --    < > = values in this interval not displayed. ------------------------------------------------------------------------------------------------------------------ estimated creatinine clearance is 49.2 mL/min (by C-G formula based on Cr of 1.01). ------------------------------------------------------------------------------------------------------------------ No results for input(s): HGBA1C in the last 72 hours. ------------------------------------------------------------------------------------------------------------------ No results for input(s): CHOL, HDL, LDLCALC, TRIG, CHOLHDL, LDLDIRECT in the last 72 hours. ------------------------------------------------------------------------------------------------------------------ No results for input(s): TSH, T4TOTAL, T3FREE, THYROIDAB in the last 72 hours.  Invalid input(s): FREET3 ------------------------------------------------------------------------------------------------------------------ No results for input(s): VITAMINB12, FOLATE, FERRITIN, TIBC, IRON, RETICCTPCT in the last 72 hours.  Coagulation profile No results for input(s): INR, PROTIME in the last 168 hours.  No results for input(s): DDIMER in the last 72 hours.  Cardiac Enzymes No results for input(s): CKMB, TROPONINI, MYOGLOBIN in the last 168 hours.  Invalid input(s): CK ------------------------------------------------------------------------------------------------------------------ Invalid input(s): POCBNP   Recent Labs  01/17/15 0845 01/17/15 0922 01/17/15 1251 01/17/15 1520 01/17/15 2230 01/18/15 0740  GLUCAP 69* 114* 87 76 86 103*     RAI,RIPUDEEP M.D. [facility] 01/18/2015, 10:34 AM  Pager: [phone]   Between 7am to 7pm - call Pager - [phone]  After 7pm go to [email or web address] - password [email or web address]  Call night coverage person covering after 7pm

## 2015-01-18 NOTE — Progress Notes (Signed)
Primary cardiologist: Dr. Nicki Guadalajara  Seen for followup: Diastolic heart failure with RV dysfunction, atrial fibrillation  Subjective:    Still with pain from shingles. Continues to diurese with IV lasix. Metolazone held yesterday due soft BP. Weight down 4 pounds.    Denies SOB. Much more alert.     Objective:   Temp:  [97.6 F (36.4 C)-98.7 F (37.1 C)] 97.6 F (36.4 C) (05/11 0741) Pulse Rate:  [46-118] 46 (05/11 0741) Resp:  [15-24] 23 (05/11 0741) BP: (82-111)/(35-76) 96/41 mmHg (05/11 0741) SpO2:  [96 %-100 %] 100 % (05/11 0741) Weight:  [220 lb 14.4 oz (100.2 kg)] 220 lb 14.4 oz (100.2 kg) (05/11 0500) Last BM Date: 01/17/15  Filed Weights   01/16/15 0500 01/17/15 0500 01/18/15 0500  Weight: 220 lb 3.8 oz (99.9 kg) 224 lb 10.4 oz (101.9 kg) 220 lb 14.4 oz (100.2 kg)    Intake/Output Summary (Last 24 hours) at 01/18/15 0831 Last data filed at 01/18/15 0800  Gross per 24 hour  Intake    603 ml  Output   2700 ml  Net  -2097 ml    Telemetry: Atrial fibrillation.  Exam: General: Obese woman, lying in bed.  Lungs: Decreased in the bases. Mild crackles toward the bases. Cardiac: Elevated JVP, irregularly irregular. 1/6 SEM  Back: Shingles rash on left scapula Abdomen: Obese, soft, active bowel sounds.  Extremities: Lower legs wrapped, chronic 1+ edema present up to the thighs. RUE PICC GU: Foley   Lab Results:  Basic Metabolic Panel:  Recent Labs Lab 01/16/15 0500 01/17/15 0603 01/18/15 0306  NA 139 140 138  K 3.5 4.0 4.4  CL 94* 94* 94*  CO2 35* 38* 32  GLUCOSE 77 65* 75  BUN 23* 24* 24*  CREATININE 0.95 1.05* 1.01*  CALCIUM 9.3 9.2 8.9    Liver Function Tests:  Recent Labs Lab 01/12/15 0500  AST 25  ALT 14  ALKPHOS 75  BILITOT 0.8  PROT 6.9  ALBUMIN 2.8*    CBC:  Recent Labs Lab 01/15/15 0401 01/16/15 0500  WBC 3.8* 3.8*  HGB 10.4* 10.6*  HCT 34.3* 34.5*  MCV 102.4* 102.7*  PLT 110* 110*    Cardiac Enzymes: No  results for input(s): CKTOTAL, CKMB, CKMBINDEX, TROPONINI in the last 168 hours.  Limited echocardiogram 01/11/2015: Study Conclusions  - Left ventricle: The cavity size was normal. There was mild concentric hypertrophy. Systolic function was mildly reduced. The estimated ejection fraction was in the range of 45% to 50%. Possible hypokinesis of the entireinferolateral myocardium. - Aortic valve: The AV is not visualized adequately to comment on. There was mild regurgitation. - Mitral valve: Calcified annulus. Moderate thickening. There was mild regurgitation. - Right ventricle: RV was not measured by appears dilated. Systolic function was moderately to severely reduced. - Pulmonary arteries: PA peak pressure: 46 mm Hg (S). - Pericardium, extracardiac: There was a left pleural effusion.  Impressions:  - The right ventricular systolic pressure was increased consistent with moderate pulmonary hypertension.    Medications:   Scheduled Medications: . antiseptic oral rinse  7 mL Mouth Rinse BID  . apixaban  5 mg Oral BID  . bisoprolol  2.5 mg Oral Daily  . colesevelam  1,875 mg Oral BID WC  . ferrous fumarate  1 tablet Oral Daily  . furosemide  80 mg Intravenous 3 times per day  . lactulose  10 g Oral TID  . levothyroxine  50 mcg Oral QAC breakfast  . metolazone  5 mg Oral BID  . multivitamin  2 tablet Oral Daily  . pantoprazole  40 mg Oral BID  . potassium chloride  40 mEq Oral BID  . sodium chloride  10-40 mL Intracatheter Q12H  . sodium chloride  3 mL Intravenous Q12H  . valACYclovir  1,000 mg Oral TID     PRN Medications: sodium chloride, acetaminophen, gi cocktail, ondansetron (ZOFRAN) IV, oxyCODONE, phenazopyridine, sodium chloride, sodium chloride, sodium chloride   Assessment:   1. Acute on chronic diastolic HF R>>L with anasarca. Urine output has increased substantially on IV Lasix and metolazone.   2. Acute on chronic RV failure with acute cor  pulmonale. PASP 46 mmHg by echocardiogram.  3. AoV disease s/p bioprosthetic AVR.  4. Morbid obesity.  5. Chronic AF.  6. Hypoalbuminemia- Albumin 2.7.  7. PAF - Eliquis. Mild nose bleed. Resolved.   8. Acute shingles - per primary team   Plan/Discussion:    Volume status improving. Renal funciton stable. Still with volume on board. Continue IV Lasix but hold  metolazone with soft BP. Has midline PICC so can't get CVP.    Chronic A fib. Rate controlled. Continue  BB and eliquis.   RHC after volume status optimized.  CLEGG,AMY, NP   Patient seen and examined with Tonye Becket, NP. We discussed all aspects of the encounter. I agree with the assessment and plan as stated above.   Continues to diurese. Still with fluid on board. Continue IV lasix. Possible RHC on Friday if otherwise stable.   Gray Maugeri,MD 5:57 PM

## 2015-01-19 LAB — GLUCOSE, CAPILLARY
GLUCOSE-CAPILLARY: 77 mg/dL (ref 65–99)
GLUCOSE-CAPILLARY: 99 mg/dL (ref 65–99)
GLUCOSE-CAPILLARY: 98 mg/dL (ref 65–99)

## 2015-01-19 NOTE — Progress Notes (Signed)
Primary cardiologist: Dr. Nicki Guadalajara  Seen for followup: Diastolic heart failure with RV dysfunction, atrial fibrillation  Subjective:    Shingles improved. Continues to diurese with IV lasix. Metolazone restarted. Weight down 34 pounds.    Denies SOB. Much more alert.     Objective:   Temp:  [97.4 F (36.3 C)-98.5 F (36.9 C)] 97.4 F (36.3 C) (05/12 1252) Pulse Rate:  [36-97] 87 (05/12 1252) Resp:  [16-20] 16 (05/12 1252) BP: (76-391)/(39-65) 108/65 mmHg (05/12 1252) SpO2:  [95 %-100 %] 99 % (05/12 1252) Weight:  [99.8 kg (220 lb 0.3 oz)] 99.8 kg (220 lb 0.3 oz) (05/12 0455) Last BM Date: 01/19/15  Filed Weights   01/17/15 0500 01/18/15 0500 01/19/15 0455  Weight: 101.9 kg (224 lb 10.4 oz) 100.2 kg (220 lb 14.4 oz) 99.8 kg (220 lb 0.3 oz)    Intake/Output Summary (Last 24 hours) at 01/19/15 1724 Last data filed at 01/19/15 1252  Gross per 24 hour  Intake    500 ml  Output   1450 ml  Net   -950 ml    Telemetry: Atrial fibrillation.  Exam: General: Obese woman, lying in bed.  Lungs: Decreased in the bases. Mild crackles toward the bases. Cardiac: Elevated JVP, irregularly irregular. 1/6 SEM  Back: Shingles rash on left scapula starting to crust over  Abdomen: Obese, soft, active bowel sounds.  Extremities: Lower legs wrapped, chronic 2+ edema present up to the thighs. RUE PICC GU: Foley   Lab Results:  Basic Metabolic Panel:  Recent Labs Lab 01/16/15 0500 01/17/15 0603 01/18/15 0306  NA 139 140 138  K 3.5 4.0 4.4  CL 94* 94* 94*  CO2 35* 38* 32  GLUCOSE 77 65* 75  BUN 23* 24* 24*  CREATININE 0.95 1.05* 1.01*  CALCIUM 9.3 9.2 8.9    Liver Function Tests: No results for input(s): AST, ALT, ALKPHOS, BILITOT, PROT, ALBUMIN in the last 168 hours.  CBC:  Recent Labs Lab 01/15/15 0401 01/16/15 0500  WBC 3.8* 3.8*  HGB 10.4* 10.6*  HCT 34.3* 34.5*  MCV 102.4* 102.7*  PLT 110* 110*    Cardiac Enzymes: No results for input(s): CKTOTAL,  CKMB, CKMBINDEX, TROPONINI in the last 168 hours.  Limited echocardiogram 01/11/2015: Study Conclusions  - Left ventricle: The cavity size was normal. There was mild concentric hypertrophy. Systolic function was mildly reduced. The estimated ejection fraction was in the range of 45% to 50%. Possible hypokinesis of the entireinferolateral myocardium. - Aortic valve: The AV is not visualized adequately to comment on. There was mild regurgitation. - Mitral valve: Calcified annulus. Moderate thickening. There was mild regurgitation. - Right ventricle: RV was not measured by appears dilated. Systolic function was moderately to severely reduced. - Pulmonary arteries: PA peak pressure: 46 mm Hg (S). - Pericardium, extracardiac: There was a left pleural effusion.  Impressions:  - The right ventricular systolic pressure was increased consistent with moderate pulmonary hypertension.    Medications:   Scheduled Medications: . antiseptic oral rinse  7 mL Mouth Rinse BID  . apixaban  5 mg Oral BID  . bisoprolol  2.5 mg Oral Daily  . colesevelam  1,875 mg Oral BID WC  . ferrous fumarate  1 tablet Oral Daily  . furosemide  80 mg Intravenous 3 times per day  . gabapentin  100 mg Oral TID  . lactulose  10 g Oral TID  . levothyroxine  50 mcg Oral QAC breakfast  . metolazone  5 mg Oral  BID  . multivitamin  2 tablet Oral Daily  . pantoprazole  40 mg Oral BID  . potassium chloride  40 mEq Oral BID  . sodium chloride  10-40 mL Intracatheter Q12H  . sodium chloride  3 mL Intravenous Q12H  . valACYclovir  1,000 mg Oral TID     PRN Medications: sodium chloride, acetaminophen, gi cocktail, ondansetron (ZOFRAN) IV, oxyCODONE, phenazopyridine, sodium chloride, sodium chloride, sodium chloride   Assessment:   1. Acute on chronic diastolic HF R>>L with anasarca. Urine output has increased substantially on IV Lasix and metolazone.   2. Acute on chronic RV failure with acute cor  pulmonale. PASP 46 mmHg by echocardiogram.  3. AoV disease s/p bioprosthetic AVR.  4. Morbid obesity.  5. Chronic AF.  6. Hypoalbuminemia- Albumin 2.7.  7. PAF - Eliquis. Mild nose bleed. Resolved.   8. Acute shingles - per primary team   Plan/Discussion:    Volume status improving. Renal function stable. Still with volume on board. Continue IV Lasix and metolazone with soft BP. Has midline PICC so can't get CVP.  Ok to transfer to tele from our standpoint.   Chronic A fib. Rate controlled. Continue  BB and eliquis.   RHC after volume status optimized. Next week.   Discussed with patient and daughter at bedside.  Michelyn Scullin,MD 5:24 PM

## 2015-01-19 NOTE — Progress Notes (Signed)
Primary cardiologist: Dr. Nicki Guadalajara  Seen for followup: Diastolic heart failure with RV dysfunction, atrial fibrillation  Subjective:    Shingles improved. Continues to diurese with IV lasix.  Weight down another 2 pounds.   Denies SOB.  RRT called due to pauses on tele.   Objective:   Temp:  [98 F (36.7 C)-98.6 F (37 C)] 98 F (36.7 C) (05/12 0400) Pulse Rate:  [36-105] 37 (05/12 0400) Resp:  [14-20] 16 (05/12 0400) BP: (76-391)/(40-71) 92/54 mmHg (05/12 0455) SpO2:  [95 %-100 %] 100 % (05/12 0600) Weight:  [220 lb 0.3 oz (99.8 kg)] 220 lb 0.3 oz (99.8 kg) (05/12 0455) Last BM Date: 01/19/15  Filed Weights   01/17/15 0500 01/18/15 0500 01/19/15 0455  Weight: 224 lb 10.4 oz (101.9 kg) 220 lb 14.4 oz (100.2 kg) 220 lb 0.3 oz (99.8 kg)    Intake/Output Summary (Last 24 hours) at 01/19/15 0800 Last data filed at 01/19/15 0600  Gross per 24 hour  Intake    900 ml  Output   1850 ml  Net   -950 ml    Telemetry: Atrial fibrillation 90s.   Exam: General: Obese woman, lying in bed.  Lungs: Decreased in the bases. Mild crackles toward the bases. Cardiac: Elevated JVP, irregularly irregular. 1/6 SEM  Back: Shingles rash on left scapula Abdomen: Obese, soft, active bowel sounds.  Extremities: Lower legs wrapped, chronic 1-2+ edema present up to the thighs. RUE PICC GU: Foley   Lab Results:  Basic Metabolic Panel:  Recent Labs Lab 01/16/15 0500 01/17/15 0603 01/18/15 0306  NA 139 140 138  K 3.5 4.0 4.4  CL 94* 94* 94*  CO2 35* 38* 32  GLUCOSE 77 65* 75  BUN 23* 24* 24*  CREATININE 0.95 1.05* 1.01*  CALCIUM 9.3 9.2 8.9    Liver Function Tests: No results for input(s): AST, ALT, ALKPHOS, BILITOT, PROT, ALBUMIN in the last 168 hours.  CBC:  Recent Labs Lab 01/15/15 0401 01/16/15 0500  WBC 3.8* 3.8*  HGB 10.4* 10.6*  HCT 34.3* 34.5*  MCV 102.4* 102.7*  PLT 110* 110*    Cardiac Enzymes: No results for input(s): CKTOTAL, CKMB, CKMBINDEX,  TROPONINI in the last 168 hours.  Limited echocardiogram 01/11/2015: Study Conclusions  - Left ventricle: The cavity size was normal. There was mild concentric hypertrophy. Systolic function was mildly reduced. The estimated ejection fraction was in the range of 45% to 50%. Possible hypokinesis of the entireinferolateral myocardium. - Aortic valve: The AV is not visualized adequately to comment on. There was mild regurgitation. - Mitral valve: Calcified annulus. Moderate thickening. There was mild regurgitation. - Right ventricle: RV was not measured by appears dilated. Systolic function was moderately to severely reduced. - Pulmonary arteries: PA peak pressure: 46 mm Hg (S). - Pericardium, extracardiac: There was a left pleural effusion.  Impressions:  - The right ventricular systolic pressure was increased consistent with moderate pulmonary hypertension.    Medications:   Scheduled Medications: . antiseptic oral rinse  7 mL Mouth Rinse BID  . apixaban  5 mg Oral BID  . bisoprolol  2.5 mg Oral Daily  . colesevelam  1,875 mg Oral BID WC  . ferrous fumarate  1 tablet Oral Daily  . furosemide  80 mg Intravenous 3 times per day  . gabapentin  100 mg Oral TID  . lactulose  10 g Oral TID  . levothyroxine  50 mcg Oral QAC breakfast  . metolazone  5 mg Oral BID  .  multivitamin  2 tablet Oral Daily  . pantoprazole  40 mg Oral BID  . potassium chloride  40 mEq Oral BID  . sodium chloride  10-40 mL Intracatheter Q12H  . sodium chloride  3 mL Intravenous Q12H  . valACYclovir  1,000 mg Oral TID     PRN Medications: sodium chloride, acetaminophen, gi cocktail, ondansetron (ZOFRAN) IV, oxyCODONE, phenazopyridine, sodium chloride, sodium chloride, sodium chloride   Assessment:   1. Acute on chronic diastolic HF R>>L with anasarca. Urine output has increased substantially on IV Lasix and metolazone.   2. Acute on chronic RV failure with acute cor pulmonale. PASP 46  mmHg by echocardiogram.  3. AoV disease s/p bioprosthetic AVR.  4. Morbid obesity.  5. Chronic AF.  6. Hypoalbuminemia- Albumin 2.7.  7. PAF - Eliquis. Mild nose bleed. Resolved.   8. Acute shingles - per primary team  9. Frequent PVCs   Plan/Discussion:    Volume status improving. Renal funciton stable. Still with volume on board. Continue IV Lasix but hold  metolazone with soft BP. Has midline PICC so can't get CVP.    Question about heart rate. In lead 2 appears to be bradycardic and not capaturing QRS due to low voltage. Changed to V lead with HR in the 90s. Chronic A fib. Rate controlled. Continue  BB and eliquis.   RHC after volume status optimized.  CLEGG,AMY, NP   Patient seen and examined with Tonye Becket, NP. We discussed all aspects of the encounter. I agree with the assessment and plan as stated above.   Volume status continues to improve but still with thigh edema. Continue to diurese. I reviewed tele and ECG strips personally. She is having frequents PVCs and many of these are low amplitude in the limb leads and looks like heart block or asystole on tele. I repositioned her leads and now tracking fine.   Probable RHC early next week  after full diuresis.   Keniah Klemmer,MD 2:19 PM

## 2015-01-19 NOTE — Progress Notes (Signed)
[facility]                                                                              Patient Demographics  Melanie Camacho, is a 81 y.o. female, DOB - May 28, 1932, ZOX:[id number]  Admit date - 01/05/2015   Admitting Physician Lorretta Harp, MD  Outpatient Primary MD for the patient is PROVIDER NOT IN SYSTEM  LOS - 14   Chief Complaint  Patient presents with  . Shortness of Breath       Brief HPI   Patient is a 81 year old female with hypertension, diabetes, diet controlled, DM, CAD, AVR, stage 3 CKD, afib on eliquis, presented on 4/28 for abdominal pain and shortness of breath.  She was evaluated with a CT abdomen and pelvis , which did not reveal any acute intra abdominal findings. There was severe anasarca with moderate right and small left pleural effusions. Patient was admitted for acute on chronic diastolic heart failure and started on IV lasix. From 4/30 and 5/1, patient was hypotensive, tachycardic and her lasix and BB was held. Cardiology and heart failure team were consulted for recommendations.  Patient was noted to be lethargic on 5/3, CT head was negative for acute intracranial pathology, ABG did not show acute hypercapnia, PCO2 of 50, ammonia level was elevated at 91 and started on lactulose 5/5 patient continued to be somnolent, MRI of the brain was obtained which was negative for acute infarct.. Medications were adjusted, discontinued Neurontin and Requip. Lactulose was increased 5/6, 5,7: Patient's mental status continues to improve, still very deconditioned, anasarca with fluid overload  5/8: Developed dermatomal rash on the left breast and radiating to the left back, started on Valtrex 5/10 frank diarrhea in the rectal tube, hence lactulose was decreased on 5/9, slightly confused at this morning, will recheck ammonia level,   Assessment & Plan    Principal Problem:  Acute on chronic diastolic CHF exacerbation with anasarca, acute on chronic RV  failure/cor pulmonale: - significant anasarca and volume overloaded, CHF team following, on Lasix and metolazone. - Still diuresing, negative balance of 23 Liters, weight down from 254-> 220lbs - BP has been running low in 80s, discussed with Dr. Gala Romney, likely to DC one of the diuretics. - Planning right heart cath  Atrial fibrillation; Rate controlled, continue eliquis Epistaxis resolved  Acute encephalopathy:Significant improved from admission, likely  medication effect along with hepatic encephalopathy, hypothyroidism - CT head is negative, MRI of the brain  5/5 negative for acute CVA, atrophia and mild chronic vascular ischemia  - Discontinued Neurontin, Requip - Continue current lactulose dose, patient is now alert and oriented  - TSH 15.71, increased Synthroid to 50 MCG daily    Rash: left dermatomal on breast, axillary line and back, appears to be shingles, now is starting to crust - Continue Valtrex 1 g 3 times a day for 10 days, airborne and contact precautions, may need to extend Valtrex to 14 days, shingles rash has not crusted yet -Improving pain, continue Neurontin at 100 mg TID (she was on 300 twice a day and I discontinued it due to her acute encephalopathy.)  Hypothyroidism: Continue Synthroid, increased to  50 MCG daily, TSH was 7.7 on 4/8, now 15.71  Restless leg syndrome: Discontinued Requip due to mental status changes, for now restarted Neurontin due to shingles neuropathic pain  Mild macrocytic anemia; Stable continue to monitor.   Hypotension: Improved. unclear etiology, afebrile, no leukocytosis - UA is negative for infection. CXR on 4/30 did not show any consolidation.  Stage 3 CKD;  - Creatinine appears at baseline   Code Status: full code  Family Communication: No family member at bed side. All the imagings, test results discussed with the patient today   Disposition Plan: Not medically ready   Time Spent in minutes  25 minutes  Procedures   None  Consults    cardiology  DVT Prophylaxis  eliquis  Medications  Scheduled Meds: . antiseptic oral rinse  7 mL Mouth Rinse BID  . apixaban  5 mg Oral BID  . bisoprolol  2.5 mg Oral Daily  . colesevelam  1,875 mg Oral BID WC  . ferrous fumarate  1 tablet Oral Daily  . furosemide  80 mg Intravenous 3 times per day  . gabapentin  100 mg Oral TID  . lactulose  10 g Oral TID  . levothyroxine  50 mcg Oral QAC breakfast  . metolazone  5 mg Oral BID  . multivitamin  2 tablet Oral Daily  . pantoprazole  40 mg Oral BID  . potassium chloride  40 mEq Oral BID  . sodium chloride  10-40 mL Intracatheter Q12H  . sodium chloride  3 mL Intravenous Q12H  . valACYclovir  1,000 mg Oral TID   Continuous Infusions:  PRN Meds:.sodium chloride, acetaminophen, gi cocktail, ondansetron (ZOFRAN) IV, oxyCODONE, phenazopyridine, sodium chloride, sodium chloride, sodium chloride   Antibiotics   Anti-infectives    Start     Dose/Rate Route Frequency Ordered Stop   01/15/15 1115  valACYclovir (VALTREX) tablet 1,000 mg     1,000 mg Oral 3 times daily 01/15/15 1114 01/25/15 0959        Subjective:   Melanie Camacho was seen and examined today. Shingles neuropathic pain improving, rash is starting to crust. Patient denies any chest pain, shortness of breath, nausea, vomiting or any new weakness.    Objective:   Blood pressure 80/39, pulse 97, temperature 98.1 F (36.7 C), temperature source Oral, resp. rate 17, height  (1.575 m), weight 99.8 kg (220 lb 0.3 oz), SpO2 99 %.  Wt Readings from Last 3 Encounters:  01/19/15 99.8 kg (220 lb 0.3 oz)  12/30/14 104.599 kg (230 lb 9.6 oz)  12/29/13 87.7 kg (193 lb 5.5 oz)     Intake/Output Summary (Last 24 hours) at 01/19/15 1112 Last data filed at 01/19/15 0951  Gross per 24 hour  Intake    920 ml  Output   1850 ml  Net   -930 ml    Exam  Gen: Alert and oriented 3 NAD  HEENT:  PERRLA, EOMI  Neck: Supple,JVD +  CVS:  irregularly  irregular  Resp: Clear to auscultation bilaterally  Abdomen: Morbidly obese,anasarca, NT, NBS  Ext: no c/c, both leg wraps bilaterally  neuro:  no new focal neurological deficits  Skin: Rash on left dermatomal area under left breast, axillary line and radiating back to scapular region, crusting now  Psych:   Alert and oriented 3, normal affect   Data Review   Micro Results Recent Results (from the past 240 hour(s))  Clostridium Difficile by PCR     Status: None   Collection  Time: 01/12/15  4:44 PM  Result Value Ref Range Status   C difficile by pcr NEGATIVE NEGATIVE Final    Radiology Reports Ct Abdomen Pelvis Wo Contrast  01/06/2015   CLINICAL DATA:  Intermittent right lower quadrant pain for several days.  EXAM: CT ABDOMEN AND PELVIS WITHOUT CONTRAST  TECHNIQUE: Multidetector CT imaging of the abdomen and pelvis was performed following the standard protocol without IV contrast.  COMPARISON:  None.  FINDINGS: BODY WALL: Severe anasarca. The abdominal wall is incompletely imaged but there is no gross fluid collection. Lax abdominal wall with wide necked herniation below the umbilicus, containing nonobstructed small bowel. Muscular or fascia calcification in the abdominal wall which could be dystrophic or from previous surgery.  LOWER CHEST: Moderate right and small left pleural effusions with multi segment atelectasis.  Aortic valve replacement. There is mild cardiomegaly and extensive coronary atherosclerosis.  ABDOMEN/PELVIS:  Liver: Large liver, with the right lobe projecting to the iliac crest. Rounded masses in the right liver are near water density, excluding a 2 cm lesion in segment 6 which was likely not visualized/evaluated on sonography from 8 days ago.  Biliary: Cholecystectomy. There is suggestion of a choledochoenterostomy, although there is no pneumobilia.  Pancreas: Unremarkable.  Spleen: Unremarkable.  Adrenals: Unremarkable.  Kidneys and ureters: No hydronephrosis or  stone.  Bladder: Decompressed by a Foley catheter.  Reproductive: Unremarkable for age.  Bowel: Distal gastrectomy. The anastomoses are patent and there is no surrounding inflammatory change. No pericecal inflammation. No bowel obstruction.  Retroperitoneum: No mass or adenopathy.  Peritoneum: Small perihepatic ascites, likely from volume overload.  Vascular: No acute abnormality.  Diffuse arterial calcification.  OSSEOUS: No acute abnormalities.  IMPRESSION: 1. No acute intra-abdominal findings. 2. Severe anasarca. Moderate right and small left pleural effusions. 3. A 2 cm lesion in segment 6 is indeterminate due to density. Other liver lesions appear cystic. 4. Ventral abdominal hernia containing nonobstructed small bowel. 5. Distal gastrectomy.   Electronically Signed   By: Marnee Spring M.D.   On: 01/06/2015 10:36   Dg Chest 2 View  01/05/2015   CLINICAL DATA:  Congestive heart failure shortness breath.  EXAM: CHEST  2 VIEW  COMPARISON:  Radiograph 12/27/2014  FINDINGS: Cardiac silhouette is enlarged. There is bilateral pleural effusions similar prior. There is mild central venous pulmonary congestion. Mild basilar atelectasis.  IMPRESSION: Bilateral pleural effusions and central venous pulmonary congestion. Findings similar to comparison exam.   Electronically Signed   By: Genevive Bi M.D.   On: 01/05/2015 17:11   Dg Chest 2 View  12/27/2014   CLINICAL DATA:  Chest pain for 1 day.  EXAM: CHEST  2 VIEW  COMPARISON:  12/20/2014.  FINDINGS: Stable enlarged cardiac silhouette. No significant change in bilateral pleural effusions. Post CABG changes are again demonstrated. Evidence of bilateral chronic rotator cuff tears. Left shoulder degenerative changes.  IMPRESSION: Stable cardiomegaly and bilateral pleural effusions.   Electronically Signed   By: Beckie Salts M.D.   On: 12/27/2014 15:47   Ct Head Wo Contrast  01/10/2015   CLINICAL DATA:  Drowsiness  EXAM: CT HEAD WITHOUT CONTRAST  TECHNIQUE:  Contiguous axial images were obtained from the base of the skull through the vertex without intravenous contrast.  COMPARISON:  None.  FINDINGS: Bony calvarium is intact. Mild atrophic changes are seen. No findings to suggest acute hemorrhage, acute infarction or space-occupying mass lesion are identified.  IMPRESSION: Mild atrophic changes without acute abnormality.   Electronically Signed   By:  Alcide Clever M.D.   On: 01/10/2015 12:35   Mr Brain Wo Contrast  01/12/2015   CLINICAL DATA:  Acute encephalopathy  EXAM: MRI HEAD WITHOUT CONTRAST  TECHNIQUE: Multiplanar, multiecho pulse sequences of the brain and surrounding structures were obtained without intravenous contrast.  COMPARISON:  CT head 01/10/2015  FINDINGS: Image quality degraded by mild to moderate motion.  Generalized atrophy. Negative for hydrocephalus. Pituitary normal in size  Negative for acute infarct  Chronic microvascular ischemic changes in the white matter are mild. No cortical infarct. No brainstem lesion. Cerebellum intact  Negative for intracranial hemorrhage  12 mm calcified mass over the convexity on the left compatible with meningioma. No edema in the adjacent brain.  IMPRESSION: Atrophy and mild chronic microvascular ischemia.  No acute infarct  12 mm calcified meningioma over the convexity on the left.   Electronically Signed   By: Marlan Palau M.D.   On: 01/12/2015 10:02   US Renal  12/29/2014   CLINICAL DATA:  Acute renal failure  EXAM: RENAL/URINARY TRACT ULTRASOUND COMPLETE  COMPARISON:  None.  FINDINGS: Right Kidney:  Length: 9.1 cm. Diffuse cortical thinning is noted. No focal mass lesion or hydronephrosis is noted.  Left Kidney:  Length: 10.2 cm. Echogenicity within normal limits. No mass or hydronephrosis visualized.  Bladder:  Appears normal for degree of bladder distention.  A 3.2 cm hepatic cyst is noted. Small bilateral pleural effusions are noted.  IMPRESSION: Mild cortical thinning in the right kidney.  No mass  lesion or hydronephrosis is noted.  Small bilateral pleural effusions.  Hepatic cyst.   Electronically Signed   By: Alcide Clever M.D.   On: 12/29/2014 11:28   Ir Fluoro Guide Cv Line Right  01/10/2015   CLINICAL DATA:  Multiple comorbidities including atrial fibrillation, hypertension, CHF, morbid obesity, poor peripheral access  EXAM: POWER PICC LINE PLACEMENT WITH ULTRASOUND AND FLUOROSCOPIC GUIDANCE  FLUOROSCOPY TIME:  30 seconds, 9 mGy  PROCEDURE: The patient was advised of the possible risks andcomplications and agreed to undergo the procedure. The patient was then brought to the angiographic suite for the procedure.  The right arm was prepped with chlorhexidine, drapedin the usual sterile fashion using maximum barrier technique (cap and mask, sterile gown, sterile gloves, large sterile sheet, hand hygiene and cutaneous antisepsis) and infiltrated locally with 1% Lidocaine.  Ultrasound demonstrated patency of the right brachial vein, and this was documented with an image. Under real-time ultrasound guidance, this vein was accessed with a 21 gauge micropuncture needle and image documentation was performed. A 0.018 wire was introduced in to the vein. Over this, a 5 [place] double lumen power PICC was advanced to the lower SVC/right atrial junction. Fluoroscopy during the procedure and fluoro spot radiograph confirms appropriate catheter position. The catheter was flushed and covered with asterile dressing.  Catheter length: 38  Complications: None  IMPRESSION: Successful right arm power PICC line placement with ultrasound and fluoroscopic guidance. The catheter is ready for use.   Electronically Signed   By: Judie Petit.  Shick M.D.   On: 01/10/2015 12:56   Ir US Guide Vasc Access Right  01/10/2015   CLINICAL DATA:  Multiple comorbidities including atrial fibrillation, hypertension, CHF, morbid obesity, poor peripheral access  EXAM: POWER PICC LINE PLACEMENT WITH ULTRASOUND AND FLUOROSCOPIC GUIDANCE  FLUOROSCOPY TIME:   30 seconds, 9 mGy  PROCEDURE: The patient was advised of the possible risks andcomplications and agreed to undergo the procedure. The patient was then brought to the angiographic suite for  the procedure.  The right arm was prepped with chlorhexidine, drapedin the usual sterile fashion using maximum barrier technique (cap and mask, sterile gown, sterile gloves, large sterile sheet, hand hygiene and cutaneous antisepsis) and infiltrated locally with 1% Lidocaine.  Ultrasound demonstrated patency of the right brachial vein, and this was documented with an image. Under real-time ultrasound guidance, this vein was accessed with a 21 gauge micropuncture needle and image documentation was performed. A 0.018 wire was introduced in to the vein. Over this, a 5 [place] double lumen power PICC was advanced to the lower SVC/right atrial junction. Fluoroscopy during the procedure and fluoro spot radiograph confirms appropriate catheter position. The catheter was flushed and covered with asterile dressing.  Catheter length: 38  Complications: None  IMPRESSION: Successful right arm power PICC line placement with ultrasound and fluoroscopic guidance. The catheter is ready for use.   Electronically Signed   By: Judie Petit.  Shick M.D.   On: 01/10/2015 12:56   Dg Chest Port 1 View  01/17/2015   CLINICAL DATA:  Status post central line placement.  EXAM: PORTABLE CHEST - 1 VIEW  COMPARISON:  January 07, 2015.  FINDINGS: Stable cardiomegaly. Stable mild central pulmonary vascular congestion is noted. Interval placement of right-sided PICC line with distal tip overlying expected position of right brachycephalic vein. No pneumothorax is noted. Bilateral pleural effusions are noted. Right midlung opacity is noted consistent with subsegmental atelectasis or possibly pneumonia.  IMPRESSION: Interval placement of right-sided PICC line with distal tip overlying expected position of right brachycephalic vein. Bilateral pleural effusions are again noted  with right midlung opacity concerning for subsegmental atelectasis or possibly pneumonia.   Electronically Signed   By: Lupita Raider, M.D.   On: 01/17/2015 12:25   Dg Chest Port 1 View  01/07/2015   CLINICAL DATA:  Respiratory difficulty. Cough. Congestion. Cardiomegaly and CHF. No fever. No chest pain.  EXAM: PORTABLE CHEST - 1 VIEW  COMPARISON:  01/05/2015  FINDINGS: Heart is enlarged. There is moderate right pleural effusion associated right basilar atelectasis. Small left pleural effusion is present. There are changes of interstitial edema.  IMPRESSION: 1. Cardiomegaly and interstitial edema. 2. Persistent bibasilar opacity, right greater than left.   Electronically Signed   By: Norva Pavlov M.D.   On: 01/07/2015 18:02    CBC  Recent Labs Lab 01/15/15 0401 01/16/15 0500  WBC 3.8* 3.8*  HGB 10.4* 10.6*  HCT 34.3* 34.5*  PLT 110* 110*  MCV 102.4* 102.7*  MCH 31.0 31.5  MCHC 30.3 30.7  RDW 17.4* 17.6*    Chemistries   Recent Labs Lab 01/14/15 0501 01/15/15 0401 01/16/15 0500 01/17/15 0603 01/18/15 0306  NA 141 143 139 140 138  K 3.5 3.5 3.5 4.0 4.4  CL 100* 97* 94* 94* 94*  CO2 33* 37* 35* 38* 32  GLUCOSE 95 98 77 65* 75  BUN 30* 26* 23* 24* 24*  CREATININE 1.14* 0.98 0.95 1.05* 1.01*  CALCIUM 9.2 9.4 9.3 9.2 8.9   ------------------------------------------------------------------------------------------------------------------ estimated creatinine clearance is 49.1 mL/min (by C-G formula based on Cr of 1.01). ------------------------------------------------------------------------------------------------------------------ No results for input(s): HGBA1C in the last 72 hours. ------------------------------------------------------------------------------------------------------------------ No results for input(s): CHOL, HDL, LDLCALC, TRIG, CHOLHDL, LDLDIRECT in the last 72  hours. ------------------------------------------------------------------------------------------------------------------ No results for input(s): TSH, T4TOTAL, T3FREE, THYROIDAB in the last 72 hours.  Invalid input(s): FREET3 ------------------------------------------------------------------------------------------------------------------ No results for input(s): VITAMINB12, FOLATE, FERRITIN, TIBC, IRON, RETICCTPCT in the last 72 hours.  Coagulation profile No results for input(s): INR,  PROTIME in the last 168 hours.  No results for input(s): DDIMER in the last 72 hours.  Cardiac Enzymes No results for input(s): CKMB, TROPONINI, MYOGLOBIN in the last 168 hours.  Invalid input(s): CK ------------------------------------------------------------------------------------------------------------------ Invalid input(s): POCBNP   Recent Labs  01/17/15 2230 01/18/15 0740 01/18/15 1210 01/18/15 1549 01/18/15 2104 01/19/15 0904  GLUCAP 86 103* 107* 97 103* 77     Salvatrice Morandi M.D. [facility] 01/19/2015, 11:12 AM  Pager: [phone]   Between 7am to 7pm - call Pager - [phone]  After 7pm go to [email or web address] - password [email or web address]  Call night coverage person covering after 7pm

## 2015-01-20 DIAGNOSIS — I493 Ventricular premature depolarization: Secondary | ICD-10-CM | POA: Insufficient documentation

## 2015-01-20 LAB — CBC
HEMATOCRIT: 31.5 % — AB (ref 36.0–46.0)
Hemoglobin: 9.7 g/dL — ABNORMAL LOW (ref 12.0–15.0)
MCH: 31.3 pg (ref 26.0–34.0)
MCHC: 30.8 g/dL (ref 30.0–36.0)
MCV: 101.6 fL — ABNORMAL HIGH (ref 78.0–100.0)
PLATELETS: 101 10*3/uL — AB (ref 150–400)
RBC: 3.1 MIL/uL — AB (ref 3.87–5.11)
RDW: 17.5 % — ABNORMAL HIGH (ref 11.5–15.5)
WBC: 3.8 10*3/uL — AB (ref 4.0–10.5)

## 2015-01-20 LAB — GLUCOSE, CAPILLARY
GLUCOSE-CAPILLARY: 78 mg/dL (ref 65–99)
GLUCOSE-CAPILLARY: 110 mg/dL — AB (ref 65–99)
Glucose-Capillary: 104 mg/dL — ABNORMAL HIGH (ref 65–99)
Glucose-Capillary: 118 mg/dL — ABNORMAL HIGH (ref 65–99)
Glucose-Capillary: 98 mg/dL (ref 65–99)

## 2015-01-20 LAB — BASIC METABOLIC PANEL
ANION GAP: 9 (ref 5–15)
BUN: 27 mg/dL — AB (ref 6–20)
CHLORIDE: 89 mmol/L — AB (ref 101–111)
CO2: 38 mmol/L — AB (ref 22–32)
CREATININE: 1.19 mg/dL — AB (ref 0.44–1.00)
Calcium: 8.7 mg/dL — ABNORMAL LOW (ref 8.9–10.3)
GFR calc non Af Amer: 42 mL/min — ABNORMAL LOW (ref >60)
GFR, EST AFRICAN AMERICAN: 49 mL/min — AB (ref >60)
GLUCOSE: 78 mg/dL (ref 65–99)
Potassium: 3.6 mmol/L (ref 3.5–5.1)
SODIUM: 136 mmol/L (ref 135–145)

## 2015-01-20 MED ORDER — BISOPROLOL FUMARATE 5 MG PO TABS
2.5000 mg | ORAL_TABLET | Freq: Every day | ORAL | Status: DC
  Administered 2015-01-21 – 2015-01-26 (×6): 2.5 mg via ORAL
  Filled 2015-01-20 (×9): qty 0.5

## 2015-01-20 MED ORDER — POTASSIUM CHLORIDE CRYS ER 20 MEQ PO TBCR
40.0000 meq | EXTENDED_RELEASE_TABLET | Freq: Once | ORAL | Status: AC
  Administered 2015-01-20: 40 meq via ORAL

## 2015-01-20 MED ORDER — ATROPINE SULFATE 0.1 MG/ML IJ SOLN
INTRAMUSCULAR | Status: AC
  Filled 2015-01-20: qty 10

## 2015-01-20 NOTE — Progress Notes (Signed)
According to CCMD, pt was ringing out bradycardic with HR in the 20-30's.  Called [facility], and notified Dr. Gala Romney and Tonye Becket, NP-C, and all arrived at pt bedside.  Vitals taken at 1146 resulted BP 96/46, HR 37. Pt was asymptomatic and resting comfortably.  Vitals were taken again at 1152 that resulted BP 96/55, HR 76.  EKG was performed that showed a 5.6 pause from the I, II, and III Leads.  The rhythm was original thought to be Second Degree Heart Block, however, P waves were not actually present.  Per Dr. Gala Romney, pt was still in a-fib, and the P-waves were actually PVCs.  Leads were changed on pt so that heart monitor would be viewing from the V-leads.  Pt now shows HR in the 80-90.  We will keep the leads on pt with the LL lead placed just below the LA lead.

## 2015-01-20 NOTE — Progress Notes (Signed)
Dr/ Bensimhon paged and notified of pt's decreasing HR 30-40s.  Vitals taken with BP 90/41, and HR had increased to 99.  Pt has hx of a-fib, now in 2nd degree heart block with frequent missed beats.  Vital signs are now stable.  RRT called to room.  STAT EKG performed as ordered.

## 2015-01-20 NOTE — Clinical Social Work Placement (Signed)
   CLINICAL SOCIAL WORK PLACEMENT  NOTE  Date:  01/20/2015  Patient Details  Name: THI BLEE MRN: [id number] Date of Birth: July 18, 1932  Clinical Social Work is seeking post-discharge placement for this patient at the Skilled  Nursing Facility level of care (*CSW will initial, date and re-position this form in  chart as items are completed):  Yes   Patient/family provided with [facility] Clinical Social Work Department's list of facilities offering this level of care within the geographic area requested by the patient (or if unable, by the patient's family).  Yes   Patient/family informed of their freedom to choose among providers that offer the needed level of care, that participate in Medicare, Medicaid or managed care program needed by the patient, have an available bed and are willing to accept the patient.  Yes   Patient/family informed of [facility]'s ownership interest in [facility] and [facility], as well as of the fact that they are under no obligation to receive care at these facilities.  PASRR submitted to EDS on       PASRR number received on       Existing PASRR number confirmed on 01/20/15     FL2 transmitted to all facilities in geographic area requested by pt/family on       FL2 transmitted to all facilities within larger geographic area on 01/20/15     Patient informed that his/her managed care company has contracts with or will negotiate with certain facilities, including the following:   (NA)         Patient/family informed of bed offers received.  Patient chooses bed at       Physician recommends and patient chooses bed at      Patient to be transferred to   on  .  Patient to be transferred to facility by       Patient family notified on   of transfer.  Name of family member notified:        PHYSICIAN Please prepare priority discharge summary, including medications, Please sign FL2, Please prepare prescriptions     Additional Comment:     _______________________________________________ Darylene Price, LCSW 01/20/2015, 4:44 PM

## 2015-01-20 NOTE — Progress Notes (Signed)
[facility]                                                                              Patient Demographics  Melanie Camacho, is a 81 y.o. female, DOB - Nov 22, 1931, ZOX:[id number]  Admit date - 01/05/2015   Admitting Physician Lorretta Harp, MD  Outpatient Primary MD for the patient is PROVIDER NOT IN SYSTEM  LOS - 15   Chief Complaint  Patient presents with  . Shortness of Breath       Brief HPI   Patient is a 81 year old female with hypertension, diabetes, diet controlled, DM, CAD, AVR, stage 3 CKD, afib on eliquis, presented on 4/28 for abdominal pain and shortness of breath.  She was evaluated with a CT abdomen and pelvis , which did not reveal any acute intra abdominal findings. There was severe anasarca with moderate right and small left pleural effusions. Patient was admitted for acute on chronic diastolic heart failure and started on IV lasix. From 4/30 and 5/1, patient was hypotensive, tachycardic and her lasix and BB was held. Cardiology and heart failure team were consulted for recommendations.  Patient was noted to be lethargic on 5/3, CT head was negative for acute intracranial pathology, ABG did not show acute hypercapnia, PCO2 of 50, ammonia level was elevated at 91 and started on lactulose 5/5 patient continued to be somnolent, MRI of the brain was obtained which was negative for acute infarct.. Medications were adjusted, discontinued Neurontin and Requip. Lactulose was increased 5/6, 5,7: Patient's mental status continues to improve, still very deconditioned, anasarca with fluid overload  5/8: Developed dermatomal rash on the left breast and radiating to the left back, started on Valtrex 5/10 frank diarrhea in the rectal tube, hence lactulose was decreased on 5/9, slightly confused at this morning, will recheck ammonia level, 5/12: Patient transferred to telemetry floor   Assessment & Plan    Principal Problem:  Acute on chronic diastolic CHF  exacerbation with anasarca, acute on chronic RV failure/cor pulmonale: - significant anasarca and volume overloaded, CHF team following, on Lasix and metolazone. - Still diuresing, negative balance of 25 L weight down from 254-> 209 lbs - Planning right heart cath  Atrial fibrillation with bradycardia issues Cardiology following,Will follow recommendations  - continue eliquis  Acute encephalopathy:Significant improved from admission, likely  medication effect along with hepatic encephalopathy, hypothyroidism. - patient is alert and oriented 3 at the time of my examination   - CT head is negative, MRI of the brain  5/5 negative for acute CVA, atrophia and mild chronic vascular ischemia  - Discontinued , Requip - Continue current lactulose dose, patient is now alert and oriented  - TSH 15.71, increased Synthroid to 50 MCG daily    Rash: left dermatomal on breast, axillary line and back, appears to be shingles, now is starting to crust - Currently on Valtrex 1 g 3 times a day x 14 days, starting to crust - Improving pain, continue Neurontin at 100 mg TID (she was on 300 twice a day and I discontinued it due to her acute encephalopathy.)  Hypothyroidism: Continue Synthroid, increased to 50 MCG daily, TSH was  7.7 on 4/8, now 15.71  Restless leg syndrome: Discontinued Requip due to mental status changes, for now restarted Neurontin due to shingles neuropathic pain  Mild macrocytic anemia; Stable continue to monitor.   Hypotension: Improved. unclear etiology, afebrile, no leukocytosis - UA is negative for infection. CXR on 4/30 did not show any consolidation.  Stage 3 CKD;  - Creatinine appears at baseline   Code Status: full code  Family Communication: No family member at bed side. All the imagings, test results discussed with the patient today   Disposition Plan: Not medically ready   Time Spent in minutes  25 minutes  Procedures  None  Consults    cardiology  DVT  Prophylaxis  eliquis  Medications  Scheduled Meds: . antiseptic oral rinse  7 mL Mouth Rinse BID  . apixaban  5 mg Oral BID  . atropine      . bisoprolol  2.5 mg Oral Daily  . colesevelam  1,875 mg Oral BID WC  . ferrous fumarate  1 tablet Oral Daily  . furosemide  80 mg Intravenous 3 times per day  . gabapentin  100 mg Oral TID  . lactulose  10 g Oral TID  . levothyroxine  50 mcg Oral QAC breakfast  . metolazone  5 mg Oral BID  . multivitamin  2 tablet Oral Daily  . pantoprazole  40 mg Oral BID  . potassium chloride  40 mEq Oral BID  . potassium chloride  40 mEq Oral Once  . sodium chloride  10-40 mL Intracatheter Q12H  . sodium chloride  3 mL Intravenous Q12H  . valACYclovir  1,000 mg Oral TID   Continuous Infusions:  PRN Meds:.sodium chloride, acetaminophen, gi cocktail, ondansetron (ZOFRAN) IV, oxyCODONE, phenazopyridine, sodium chloride, sodium chloride, sodium chloride   Antibiotics   Anti-infectives    Start     Dose/Rate Route Frequency Ordered Stop   01/15/15 1115  valACYclovir (VALTREX) tablet 1,000 mg     1,000 mg Oral 3 times daily 01/15/15 1114 01/25/15 0959        Subjective:   Melanie Camacho was seen and examined today. No complaints, likes the new floor. Neuropathic pain from shingles improving denies any shortness of breath, fevers or chills, chest pain. Rash is starting to crust. No abdominal pain, nausea, vomiting or any new weakness.    Objective:   Blood pressure 96/55, pulse 76, temperature 97.9 F (36.6 C), temperature source Oral, resp. rate 18, height  (1.575 m), weight 95.165 kg (209 lb 12.8 oz), SpO2 100 %.  Wt Readings from Last 3 Encounters:  01/20/15 95.165 kg (209 lb 12.8 oz)  12/30/14 104.599 kg (230 lb 9.6 oz)  12/29/13 87.7 kg (193 lb 5.5 oz)     Intake/Output Summary (Last 24 hours) at 01/20/15 1226 Last data filed at 01/20/15 1111  Gross per 24 hour  Intake    240 ml  Output   2150 ml  Net  -1910 ml    Exam  Gen:  Alert and oriented 3 NAD  HEENT:  PERRLA, EOMI  Neck: Supple,JVD +  CVS:  irregularly irregular  Resp: CTAB  Abdomen: Morbidly obese,anasarca, NT, NBS  Ext: no c/c, both leg wraps bilaterally  neuro:  no new focal neurological deficits  Skin: Rash on left dermatomal area under left breast, axillary line and left scapular region, crusting now  Psych:   Alert and oriented 3, normal affect   Data Review   Micro Results Recent Results (from the  past 240 hour(s))  Clostridium Difficile by PCR     Status: None   Collection Time: 01/12/15  4:44 PM  Result Value Ref Range Status   C difficile by pcr NEGATIVE NEGATIVE Final    Radiology Reports Ct Abdomen Pelvis Wo Contrast  01/06/2015   CLINICAL DATA:  Intermittent right lower quadrant pain for several days.  EXAM: CT ABDOMEN AND PELVIS WITHOUT CONTRAST  TECHNIQUE: Multidetector CT imaging of the abdomen and pelvis was performed following the standard protocol without IV contrast.  COMPARISON:  None.  FINDINGS: BODY WALL: Severe anasarca. The abdominal wall is incompletely imaged but there is no gross fluid collection. Lax abdominal wall with wide necked herniation below the umbilicus, containing nonobstructed small bowel. Muscular or fascia calcification in the abdominal wall which could be dystrophic or from previous surgery.  LOWER CHEST: Moderate right and small left pleural effusions with multi segment atelectasis.  Aortic valve replacement. There is mild cardiomegaly and extensive coronary atherosclerosis.  ABDOMEN/PELVIS:  Liver: Large liver, with the right lobe projecting to the iliac crest. Rounded masses in the right liver are near water density, excluding a 2 cm lesion in segment 6 which was likely not visualized/evaluated on sonography from 8 days ago.  Biliary: Cholecystectomy. There is suggestion of a choledochoenterostomy, although there is no pneumobilia.  Pancreas: Unremarkable.  Spleen: Unremarkable.  Adrenals:  Unremarkable.  Kidneys and ureters: No hydronephrosis or stone.  Bladder: Decompressed by a Foley catheter.  Reproductive: Unremarkable for age.  Bowel: Distal gastrectomy. The anastomoses are patent and there is no surrounding inflammatory change. No pericecal inflammation. No bowel obstruction.  Retroperitoneum: No mass or adenopathy.  Peritoneum: Small perihepatic ascites, likely from volume overload.  Vascular: No acute abnormality.  Diffuse arterial calcification.  OSSEOUS: No acute abnormalities.  IMPRESSION: 1. No acute intra-abdominal findings. 2. Severe anasarca. Moderate right and small left pleural effusions. 3. A 2 cm lesion in segment 6 is indeterminate due to density. Other liver lesions appear cystic. 4. Ventral abdominal hernia containing nonobstructed small bowel. 5. Distal gastrectomy.   Electronically Signed   By: Marnee Spring M.D.   On: 01/06/2015 10:36   Dg Chest 2 View  01/05/2015   CLINICAL DATA:  Congestive heart failure shortness breath.  EXAM: CHEST  2 VIEW  COMPARISON:  Radiograph 12/27/2014  FINDINGS: Cardiac silhouette is enlarged. There is bilateral pleural effusions similar prior. There is mild central venous pulmonary congestion. Mild basilar atelectasis.  IMPRESSION: Bilateral pleural effusions and central venous pulmonary congestion. Findings similar to comparison exam.   Electronically Signed   By: Genevive Bi M.D.   On: 01/05/2015 17:11   Dg Chest 2 View  12/27/2014   CLINICAL DATA:  Chest pain for 1 day.  EXAM: CHEST  2 VIEW  COMPARISON:  12/20/2014.  FINDINGS: Stable enlarged cardiac silhouette. No significant change in bilateral pleural effusions. Post CABG changes are again demonstrated. Evidence of bilateral chronic rotator cuff tears. Left shoulder degenerative changes.  IMPRESSION: Stable cardiomegaly and bilateral pleural effusions.   Electronically Signed   By: Beckie Salts M.D.   On: 12/27/2014 15:47   Ct Head Wo Contrast  01/10/2015   CLINICAL DATA:   Drowsiness  EXAM: CT HEAD WITHOUT CONTRAST  TECHNIQUE: Contiguous axial images were obtained from the base of the skull through the vertex without intravenous contrast.  COMPARISON:  None.  FINDINGS: Bony calvarium is intact. Mild atrophic changes are seen. No findings to suggest acute hemorrhage, acute infarction or space-occupying mass lesion  are identified.  IMPRESSION: Mild atrophic changes without acute abnormality.   Electronically Signed   By: Alcide Clever M.D.   On: 01/10/2015 12:35   Mr Brain Wo Contrast  01/12/2015   CLINICAL DATA:  Acute encephalopathy  EXAM: MRI HEAD WITHOUT CONTRAST  TECHNIQUE: Multiplanar, multiecho pulse sequences of the brain and surrounding structures were obtained without intravenous contrast.  COMPARISON:  CT head 01/10/2015  FINDINGS: Image quality degraded by mild to moderate motion.  Generalized atrophy. Negative for hydrocephalus. Pituitary normal in size  Negative for acute infarct  Chronic microvascular ischemic changes in the white matter are mild. No cortical infarct. No brainstem lesion. Cerebellum intact  Negative for intracranial hemorrhage  12 mm calcified mass over the convexity on the left compatible with meningioma. No edema in the adjacent brain.  IMPRESSION: Atrophy and mild chronic microvascular ischemia.  No acute infarct  12 mm calcified meningioma over the convexity on the left.   Electronically Signed   By: Marlan Palau M.D.   On: 01/12/2015 10:02   US Renal  12/29/2014   CLINICAL DATA:  Acute renal failure  EXAM: RENAL/URINARY TRACT ULTRASOUND COMPLETE  COMPARISON:  None.  FINDINGS: Right Kidney:  Length: 9.1 cm. Diffuse cortical thinning is noted. No focal mass lesion or hydronephrosis is noted.  Left Kidney:  Length: 10.2 cm. Echogenicity within normal limits. No mass or hydronephrosis visualized.  Bladder:  Appears normal for degree of bladder distention.  A 3.2 cm hepatic cyst is noted. Small bilateral pleural effusions are noted.  IMPRESSION:  Mild cortical thinning in the right kidney.  No mass lesion or hydronephrosis is noted.  Small bilateral pleural effusions.  Hepatic cyst.   Electronically Signed   By: Alcide Clever M.D.   On: 12/29/2014 11:28   Ir Fluoro Guide Cv Line Right  01/10/2015   CLINICAL DATA:  Multiple comorbidities including atrial fibrillation, hypertension, CHF, morbid obesity, poor peripheral access  EXAM: POWER PICC LINE PLACEMENT WITH ULTRASOUND AND FLUOROSCOPIC GUIDANCE  FLUOROSCOPY TIME:  30 seconds, 9 mGy  PROCEDURE: The patient was advised of the possible risks andcomplications and agreed to undergo the procedure. The patient was then brought to the angiographic suite for the procedure.  The right arm was prepped with chlorhexidine, drapedin the usual sterile fashion using maximum barrier technique (cap and mask, sterile gown, sterile gloves, large sterile sheet, hand hygiene and cutaneous antisepsis) and infiltrated locally with 1% Lidocaine.  Ultrasound demonstrated patency of the right brachial vein, and this was documented with an image. Under real-time ultrasound guidance, this vein was accessed with a 21 gauge micropuncture needle and image documentation was performed. A 0.018 wire was introduced in to the vein. Over this, a 5 [place] double lumen power PICC was advanced to the lower SVC/right atrial junction. Fluoroscopy during the procedure and fluoro spot radiograph confirms appropriate catheter position. The catheter was flushed and covered with asterile dressing.  Catheter length: 38  Complications: None  IMPRESSION: Successful right arm power PICC line placement with ultrasound and fluoroscopic guidance. The catheter is ready for use.   Electronically Signed   By: Judie Petit.  Shick M.D.   On: 01/10/2015 12:56   Ir US Guide Vasc Access Right  01/10/2015   CLINICAL DATA:  Multiple comorbidities including atrial fibrillation, hypertension, CHF, morbid obesity, poor peripheral access  EXAM: POWER PICC LINE PLACEMENT WITH  ULTRASOUND AND FLUOROSCOPIC GUIDANCE  FLUOROSCOPY TIME:  30 seconds, 9 mGy  PROCEDURE: The patient was advised of the possible risks  andcomplications and agreed to undergo the procedure. The patient was then brought to the angiographic suite for the procedure.  The right arm was prepped with chlorhexidine, drapedin the usual sterile fashion using maximum barrier technique (cap and mask, sterile gown, sterile gloves, large sterile sheet, hand hygiene and cutaneous antisepsis) and infiltrated locally with 1% Lidocaine.  Ultrasound demonstrated patency of the right brachial vein, and this was documented with an image. Under real-time ultrasound guidance, this vein was accessed with a 21 gauge micropuncture needle and image documentation was performed. A 0.018 wire was introduced in to the vein. Over this, a 5 [place] double lumen power PICC was advanced to the lower SVC/right atrial junction. Fluoroscopy during the procedure and fluoro spot radiograph confirms appropriate catheter position. The catheter was flushed and covered with asterile dressing.  Catheter length: 38  Complications: None  IMPRESSION: Successful right arm power PICC line placement with ultrasound and fluoroscopic guidance. The catheter is ready for use.   Electronically Signed   By: Judie Petit.  Shick M.D.   On: 01/10/2015 12:56   Dg Chest Port 1 View  01/17/2015   CLINICAL DATA:  Status post central line placement.  EXAM: PORTABLE CHEST - 1 VIEW  COMPARISON:  January 07, 2015.  FINDINGS: Stable cardiomegaly. Stable mild central pulmonary vascular congestion is noted. Interval placement of right-sided PICC line with distal tip overlying expected position of right brachycephalic vein. No pneumothorax is noted. Bilateral pleural effusions are noted. Right midlung opacity is noted consistent with subsegmental atelectasis or possibly pneumonia.  IMPRESSION: Interval placement of right-sided PICC line with distal tip overlying expected position of right  brachycephalic vein. Bilateral pleural effusions are again noted with right midlung opacity concerning for subsegmental atelectasis or possibly pneumonia.   Electronically Signed   By: Lupita Raider, M.D.   On: 01/17/2015 12:25   Dg Chest Port 1 View  01/07/2015   CLINICAL DATA:  Respiratory difficulty. Cough. Congestion. Cardiomegaly and CHF. No fever. No chest pain.  EXAM: PORTABLE CHEST - 1 VIEW  COMPARISON:  01/05/2015  FINDINGS: Heart is enlarged. There is moderate right pleural effusion associated right basilar atelectasis. Small left pleural effusion is present. There are changes of interstitial edema.  IMPRESSION: 1. Cardiomegaly and interstitial edema. 2. Persistent bibasilar opacity, right greater than left.   Electronically Signed   By: Norva Pavlov M.D.   On: 01/07/2015 18:02    CBC  Recent Labs Lab 01/15/15 0401 01/16/15 0500 01/20/15 0505  WBC 3.8* 3.8* 3.8*  HGB 10.4* 10.6* 9.7*  HCT 34.3* 34.5* 31.5*  PLT 110* 110* 101*  MCV 102.4* 102.7* 101.6*  MCH 31.0 31.5 31.3  MCHC 30.3 30.7 30.8  RDW 17.4* 17.6* 17.5*    Chemistries   Recent Labs Lab 01/15/15 0401 01/16/15 0500 01/17/15 0603 01/18/15 0306 01/20/15 0505  NA 143 139 140 138 136  K 3.5 3.5 4.0 4.4 3.6  CL 97* 94* 94* 94* 89*  CO2 37* 35* 38* 32 38*  GLUCOSE 98 77 65* 75 78  BUN 26* 23* 24* 24* 27*  CREATININE 0.98 0.95 1.05* 1.01* 1.19*  CALCIUM 9.4 9.3 9.2 8.9 8.7*   ------------------------------------------------------------------------------------------------------------------ estimated creatinine clearance is 40.5 mL/min (by C-G formula based on Cr of 1.19). ------------------------------------------------------------------------------------------------------------------ No results for input(s): HGBA1C in the last 72 hours. ------------------------------------------------------------------------------------------------------------------ No results for input(s): CHOL, HDL, LDLCALC, TRIG,  CHOLHDL, LDLDIRECT in the last 72 hours. ------------------------------------------------------------------------------------------------------------------ No results for input(s): TSH, T4TOTAL, T3FREE, THYROIDAB in the last 72 hours.  Invalid input(s): FREET3 ------------------------------------------------------------------------------------------------------------------ No results for input(s): VITAMINB12, FOLATE, FERRITIN, TIBC, IRON, RETICCTPCT in the last 72 hours.  Coagulation profile No results for input(s): INR, PROTIME in the last 168 hours.  No results for input(s): DDIMER in the last 72 hours.  Cardiac Enzymes No results for input(s): CKMB, TROPONINI, MYOGLOBIN in the last 168 hours.  Invalid input(s): CK ------------------------------------------------------------------------------------------------------------------ Invalid input(s): POCBNP   Recent Labs  01/19/15 0904 01/19/15 1252 01/19/15 1739 01/20/15 0156 01/20/15 0611 01/20/15 1108  GLUCAP 77 99 98 104* 78 110*     Jatavion Peaster M.D. [facility] 01/20/2015, 12:26 PM  Pager: [phone]   Between 7am to 7pm - call Pager - [phone]  After 7pm go to [email or web address] - password [email or web address]  Call night coverage person covering after 7pm

## 2015-01-20 NOTE — Progress Notes (Signed)
Called by RN to assist with patient in second degree with frequent dropped QRS with HR 28.  Upon my arrival to patients room, RN at bedside and HR now in the 80's.  Patient lying in bed, arouseable but lethargic on [place].  SBP 90's.  MD as been paged.  EKG being obtained.  No RRT interventions at this time, RN to call if assistance needed

## 2015-01-20 NOTE — Clinical Social Work Note (Signed)
Clinical Social Work Assessment  Patient Details  Name: Melanie Camacho MRN: [id number] Date of Birth: 01-Jun-1932  Date of referral:  01/20/15               Reason for consult:  Facility Placement                Permission sought to share information with:  Family Supports, Oceanographer granted to share information::  Yes, Verbal Permission Granted  Name::     Area SNFs [facility];  Daughters:  [name] and Counsellor       Housing/Transportation Living arrangements for the past 2 months:  Single Family Home (Was placed in a SNF about a week ago in [place]. Placed from [facility] by his CSW) Source of Information:  Patient, Adult Children Patient Interpreter Needed:  None Criminal Activity/Legal Involvement Pertinent to Current Situation/Hospitalization:  No - Comment as needed Significant Relationships:  Adult Children Lives with:  Other (Comment) (Recently placed in a SNF in [place]. Prior to this she lived with daughter [name]) Do you feel safe going back to the place where you live?  No Need for family participation in patient care:  Yes (Comment)  Care giving concerns:  Patient has multiple medical issues- on respiratory isolation and has shingles. Has multiple skin issues.  Both daughters state that the facility that they located for patient last month was unsatisfactory and daughter feels that her mother would have "died" if she had remained there.  Patient states that she is feeling so much better since coming to the hospital and agrees with her daughter's assessment of her prior care.    Social Worker assessment / plan: 81 year old female referred to this CSW for SNF placement. Patient was placed in late April in [place] in a SNF that was choice of her daughters.  CSW spoke with daughter Leroy Libman who stated that she was visiting her mother and found her to be very ill. Daughter removed patient the facility and brought her straight to the  Emergency Room at [facility] where she was admitted.  Daughter states that she was only at the SNF for 5 days but they were very disastified with the care at the SNF.  They are now requesting placement at the [facility] in [place], [place].  Daughter [name] lives in [place], [place] and her sister [place] lives in  [place], [place].  They do not wish to bring patient into [place] at his time due to the difficulties they had obtaining [place] Medicaid for patient.  They do not want have to restart this process.  Fl2 completed and placed on chart for MD's signature. She is in a negative pressure room and nursing staff indicate that she has shingles along with other infectious issues.  Patient discussed briefly with Dr. Isidoro Donning who indicated that patient is far from being stable for d/c at this time.  Employment status:  Disabled (Comment on whether or not currently receiving Disability), Retired Health and safety inspector:  Medicare PT Recommendations:  Skilled Nursing Facility Information / Referral to community resources:  Skilled Nursing Facility  Patient/Family's Response to care: Patient and both daughters verbalized that her condition is much improved and are thankful that she was admitted to the hospital.  They hope that she will eventually improve enough to be able to return home with her daughter [name] after she gets stronger and is able to ambulate.   Patient/Family's Understanding of and Emotional Response to Diagnosis,  Current Treatment, and Prognosis:  Daughter [name] related that she felt that her mother would have died if she had not taken her from the SNF. She feels that while her mother was quite sick- she is doing a little better now.  Daughters are anxious to see patient improve to level or SNF placement.  Patient stated that the doctors and nurses have really helped her thus far and she feels "hope" that she is going to get better.  Emotional Assessment Appearance:  Appears stated  age Attitude/Demeanor/Rapport:  Other (Calm, somewhat flat affected, cooperative and interactive with CSW) Affect (typically observed):  Calm, Quiet, Pleasant, Flat Orientation:  Oriented to Self, Oriented to Place, Oriented to  Time, Oriented to Situation Alcohol / Substance use:  Never Used Psych involvement (Current and /or in the community):  No (Comment)  Discharge Needs  Concerns to be addressed:  Discharge Planning Concerns (Patient has multiple medical issues.) Readmission within the last 30 days:  Yes Current discharge risk:  Physical Impairment, Dependent with Mobility (Currently on respiratory isolation. ) Barriers to Discharge:  Continued Medical Work up   Lovette Cliche T, LCSW 01/20/2015, 9:09 PM

## 2015-01-20 NOTE — Progress Notes (Signed)
Pt. BP 88/35 HR 41. Pt. Resting in bed. No s/s of distress or discomfort noted. On call NP, Donnamarie Poag made aware of pts. BP. Pt. Has 80mg  of IV Lasix scheduled at this time. RN instructed by NP to hold IV Lasix at this time. On coming RN to be made aware. Phillippa Straub, Cheryll Dessert

## 2015-01-21 LAB — GLUCOSE, CAPILLARY
GLUCOSE-CAPILLARY: 93 mg/dL (ref 65–99)
Glucose-Capillary: 77 mg/dL (ref 65–99)
Glucose-Capillary: 98 mg/dL (ref 65–99)
Glucose-Capillary: 83 mg/dL (ref 65–99)

## 2015-01-21 LAB — BASIC METABOLIC PANEL
ANION GAP: 11 (ref 5–15)
BUN: 27 mg/dL — ABNORMAL HIGH (ref 6–20)
CO2: 38 mmol/L — AB (ref 22–32)
Calcium: 8.8 mg/dL — ABNORMAL LOW (ref 8.9–10.3)
Chloride: 90 mmol/L — ABNORMAL LOW (ref 101–111)
Creatinine, Ser: 1.16 mg/dL — ABNORMAL HIGH (ref 0.44–1.00)
GFR calc Af Amer: 50 mL/min — ABNORMAL LOW (ref >60)
GFR, EST NON AFRICAN AMERICAN: 43 mL/min — AB (ref >60)
Glucose, Bld: 90 mg/dL (ref 65–99)
Potassium: 3.7 mmol/L (ref 3.5–5.1)
Sodium: 139 mmol/L (ref 135–145)

## 2015-01-21 MED ORDER — SODIUM CHLORIDE 0.9 % IV BOLUS (SEPSIS)
250.0000 mL | Freq: Once | INTRAVENOUS | Status: AC
  Administered 2015-01-21: 250 mL via INTRAVENOUS

## 2015-01-21 NOTE — Progress Notes (Signed)
The patient had semi-formed stools this morning and her rectal pouch came off.  She complained of abdominal pain at the beginning of the night shift, which was relieved with Oxy IR.  She was able to sleep most of the night and did not have any other complaints.  Her BP came up some and her VS are stable.

## 2015-01-21 NOTE — Progress Notes (Signed)
[facility] PROGRESS NOTE  Melanie Camacho ORV:[id number] DOB: [birth year] DOA: 01/05/2015 PCP: PROVIDER NOT IN SYSTEM  Assessment/Plan: Principal Problem:   Acute on chronic diastolic CHF (congestive heart failure), NYHA class 1 -Patient is on a blocker -Patient on Lasix - Has had transient hypotension - Cardiology on board  Active Problems:    Chronic a-fib/S/P AVR-(tissue) 2011 at [facility] - no CABG with this - Cardiology on board    HTN - Patient has had transient hypotension while in house we'll place holding orders on Lasix and beta blocker    Hypothyroid - stable on synthroid    DM (diabetes mellitus) - continue carb modified diet - Seems to be well controlled on diet    CKD (chronic kidney disease), stage III -Serum creatinine stable   Code Status: full Family Communication: No family at bedside Disposition Plan: Pending recommendations from cardiology on board   Consultants:  Cardiology  Procedures:  None  Antibiotics:  None  HPI/Subjective: Patient has no new complaints. No acute issues overnight  Objective: Filed Vitals:   01/21/15 1430  BP: 91/37  Pulse: 108  Temp: 97.4 F (36.3 C)  Resp: 18    Intake/Output Summary (Last 24 hours) at 01/21/15 1814 Last data filed at 01/21/15 1400  Gross per 24 hour  Intake    920 ml  Output   2701 ml  Net  -1781 ml   Filed Weights   01/19/15 1939 01/20/15 0500 01/21/15 0630  Weight: 95.936 kg (211 lb 8 oz) 95.165 kg (209 lb 12.8 oz) 97.16 kg (214 lb 3.2 oz)    Exam:   General:  Pt in nad, alert and awake  Cardiovascular: Irregularly irregular, no rubs  Respiratory: cta bl, no wheezes  Abdomen: soft, NT, ND, no guarding  Musculoskeletal: no cyanosis or clubbing   Data Reviewed: Basic Metabolic Panel:  Recent Labs Lab 01/16/15 0500 01/17/15 0603 01/18/15 0306 01/20/15 0505 01/21/15 0511  NA 139 140 138 136 139  K 3.5 4.0 4.4 3.6 3.7  CL 94* 94* 94* 89* 90*  CO2 35* 38* 32 38*  38*  GLUCOSE 77 65* 75 78 90  BUN 23* 24* 24* 27* 27*  CREATININE 0.95 1.05* 1.01* 1.19* 1.16*  CALCIUM 9.3 9.2 8.9 8.7* 8.8*   Liver Function Tests: No results for input(s): AST, ALT, ALKPHOS, BILITOT, PROT, ALBUMIN in the last 168 hours. No results for input(s): LIPASE, AMYLASE in the last 168 hours.  Recent Labs Lab 01/17/15 1557  AMMONIA 33   CBC:  Recent Labs Lab 01/15/15 0401 01/16/15 0500 01/20/15 0505  WBC 3.8* 3.8* 3.8*  HGB 10.4* 10.6* 9.7*  HCT 34.3* 34.5* 31.5*  MCV 102.4* 102.7* 101.6*  PLT 110* 110* 101*   Cardiac Enzymes: No results for input(s): CKTOTAL, CKMB, CKMBINDEX, TROPONINI in the last 168 hours. BNP (last 3 results)  Recent Labs  12/14/14 1410 12/27/14 0449 01/05/15 1528  BNP 1222.9* 1099.7* 972.1*    ProBNP (last 3 results) No results for input(s): PROBNP in the last 8760 hours.  CBG:  Recent Labs Lab 01/20/15 1627 01/20/15 2111 01/21/15 0656 01/21/15 1143 01/21/15 1700  GLUCAP 118* 98 77 98 83    Recent Results (from the past 240 hour(s))  Clostridium Difficile by PCR     Status: None   Collection Time: 01/12/15  4:44 PM  Result Value Ref Range Status   C difficile by pcr NEGATIVE NEGATIVE Final     Studies: No results found.  Scheduled Meds: . antiseptic  oral rinse  7 mL Mouth Rinse BID  . apixaban  5 mg Oral BID  . bisoprolol  2.5 mg Oral Daily  . colesevelam  1,875 mg Oral BID WC  . ferrous fumarate  1 tablet Oral Daily  . furosemide  80 mg Intravenous 3 times per day  . gabapentin  100 mg Oral TID  . lactulose  10 g Oral TID  . levothyroxine  50 mcg Oral QAC breakfast  . metolazone  5 mg Oral BID  . multivitamin  2 tablet Oral Daily  . pantoprazole  40 mg Oral BID  . potassium chloride  40 mEq Oral BID  . sodium chloride  250 mL Intravenous Once  . sodium chloride  10-40 mL Intracatheter Q12H  . sodium chloride  3 mL Intravenous Q12H  . valACYclovir  1,000 mg Oral TID   Continuous Infusions:    Time  spent: > 35 minutes    Penny Pia  [facility] Pager [phone] If 7PM-7AM, please contact night-coverage at [email or web address], password [facility] 01/21/2015, 6:14 PM  LOS: 16 days

## 2015-01-21 NOTE — Progress Notes (Signed)
I have reviewed telemetry today Jan 21, 2015. There is atrial fibrillation. The rate is controlled. There are scattered PVCs.  Melanie Bonito, MD

## 2015-01-22 LAB — BASIC METABOLIC PANEL
ANION GAP: 10 (ref 5–15)
BUN: 28 mg/dL — AB (ref 6–20)
CHLORIDE: 89 mmol/L — AB (ref 101–111)
CO2: 39 mmol/L — AB (ref 22–32)
CREATININE: 1.16 mg/dL — AB (ref 0.44–1.00)
Calcium: 8.5 mg/dL — ABNORMAL LOW (ref 8.9–10.3)
GFR calc Af Amer: 50 mL/min — ABNORMAL LOW (ref >60)
GFR calc non Af Amer: 43 mL/min — ABNORMAL LOW (ref >60)
GLUCOSE: 78 mg/dL (ref 65–99)
Potassium: 3.4 mmol/L — ABNORMAL LOW (ref 3.5–5.1)
SODIUM: 138 mmol/L (ref 135–145)

## 2015-01-22 LAB — CARBOXYHEMOGLOBIN
Carboxyhemoglobin: 1.8 % — ABNORMAL HIGH (ref 0.5–1.5)
METHEMOGLOBIN: 0.8 % (ref 0.0–1.5)
O2 Saturation: 67.4 %
TOTAL HEMOGLOBIN: 10.5 g/dL — AB (ref 12.0–16.0)

## 2015-01-22 LAB — GLUCOSE, CAPILLARY
Glucose-Capillary: 70 mg/dL (ref 65–99)
Glucose-Capillary: 93 mg/dL (ref 65–99)
Glucose-Capillary: 85 mg/dL (ref 65–99)
Glucose-Capillary: 98 mg/dL (ref 65–99)

## 2015-01-22 MED ORDER — FUROSEMIDE 10 MG/ML IJ SOLN
80.0000 mg | Freq: Every day | INTRAMUSCULAR | Status: DC
Start: 2015-01-23 — End: 2015-01-23
  Filled 2015-01-22: qty 8

## 2015-01-22 NOTE — Progress Notes (Signed)
SUBJECTIVE:  The patient says that overall she is feeling better. With her diuresis her renal function has remained stable. Her systolic blood pressure is now in the range of 98. We have held some of her medications.   Filed Vitals:   01/21/15 2000 01/22/15 0617 01/22/15 0700 01/22/15 1431  BP: 123/84 99/46 93/59  97/45  Pulse: 104 93 95   Temp: 97.8 F (36.6 C)     TempSrc: Oral Oral    Resp: 16 20    Height:      Weight:  212 lb 14.4 oz (96.571 kg)    SpO2: 98% 93%  93%     Intake/Output Summary (Last 24 hours) at 01/22/15 1437 Last data filed at 01/22/15 0625  Gross per 24 hour  Intake      0 ml  Output   1450 ml  Net  -1450 ml    LABS: Basic Metabolic Panel:  Recent Labs  16/10/96 0511 01/22/15 0535  NA 139 138  K 3.7 3.4*  CL 90* 89*  CO2 38* 39*  GLUCOSE 90 78  BUN 27* 28*  CREATININE 1.16* 1.16*  CALCIUM 8.8* 8.5*   Liver Function Tests: No results for input(s): AST, ALT, ALKPHOS, BILITOT, PROT, ALBUMIN in the last 72 hours. No results for input(s): LIPASE, AMYLASE in the last 72 hours. CBC:  Recent Labs  01/20/15 0505  WBC 3.8*  HGB 9.7*  HCT 31.5*  MCV 101.6*  PLT 101*   Cardiac Enzymes: No results for input(s): CKTOTAL, CKMB, CKMBINDEX, TROPONINI in the last 72 hours. BNP: Invalid input(s): POCBNP D-Dimer: No results for input(s): DDIMER in the last 72 hours. Hemoglobin A1C: No results for input(s): HGBA1C in the last 72 hours. Fasting Lipid Panel: No results for input(s): CHOL, HDL, LDLCALC, TRIG, CHOLHDL, LDLDIRECT in the last 72 hours. Thyroid Function Tests: No results for input(s): TSH, T4TOTAL, T3FREE, THYROIDAB in the last 72 hours.  Invalid input(s): FREET3  RADIOLOGY: Ct Abdomen Pelvis Wo Contrast  01/06/2015   CLINICAL DATA:  Intermittent right lower quadrant pain for several days.  EXAM: CT ABDOMEN AND PELVIS WITHOUT CONTRAST  TECHNIQUE: Multidetector CT imaging of the abdomen and pelvis was performed following the  standard protocol without IV contrast.  COMPARISON:  None.  FINDINGS: BODY WALL: Severe anasarca. The abdominal wall is incompletely imaged but there is no gross fluid collection. Lax abdominal wall with wide necked herniation below the umbilicus, containing nonobstructed small bowel. Muscular or fascia calcification in the abdominal wall which could be dystrophic or from previous surgery.  LOWER CHEST: Moderate right and small left pleural effusions with multi segment atelectasis.  Aortic valve replacement. There is mild cardiomegaly and extensive coronary atherosclerosis.  ABDOMEN/PELVIS:  Liver: Large liver, with the right lobe projecting to the iliac crest. Rounded masses in the right liver are near water density, excluding a 2 cm lesion in segment 6 which was likely not visualized/evaluated on sonography from 8 days ago.  Biliary: Cholecystectomy. There is suggestion of a choledochoenterostomy, although there is no pneumobilia.  Pancreas: Unremarkable.  Spleen: Unremarkable.  Adrenals: Unremarkable.  Kidneys and ureters: No hydronephrosis or stone.  Bladder: Decompressed by a Foley catheter.  Reproductive: Unremarkable for age.  Bowel: Distal gastrectomy. The anastomoses are patent and there is no surrounding inflammatory change. No pericecal inflammation. No bowel obstruction.  Retroperitoneum: No mass or adenopathy.  Peritoneum: Small perihepatic ascites, likely from volume overload.  Vascular: No acute abnormality.  Diffuse arterial calcification.  OSSEOUS: No acute  abnormalities.  IMPRESSION: 1. No acute intra-abdominal findings. 2. Severe anasarca. Moderate right and small left pleural effusions. 3. A 2 cm lesion in segment 6 is indeterminate due to density. Other liver lesions appear cystic. 4. Ventral abdominal hernia containing nonobstructed small bowel. 5. Distal gastrectomy.   Electronically Signed   By: Marnee Spring M.D.   On: 01/06/2015 10:36   Dg Chest 2 View  01/05/2015   CLINICAL DATA:   Congestive heart failure shortness breath.  EXAM: CHEST  2 VIEW  COMPARISON:  Radiograph 12/27/2014  FINDINGS: Cardiac silhouette is enlarged. There is bilateral pleural effusions similar prior. There is mild central venous pulmonary congestion. Mild basilar atelectasis.  IMPRESSION: Bilateral pleural effusions and central venous pulmonary congestion. Findings similar to comparison exam.   Electronically Signed   By: Genevive Bi M.D.   On: 01/05/2015 17:11   Dg Chest 2 View  12/27/2014   CLINICAL DATA:  Chest pain for 1 day.  EXAM: CHEST  2 VIEW  COMPARISON:  12/20/2014.  FINDINGS: Stable enlarged cardiac silhouette. No significant change in bilateral pleural effusions. Post CABG changes are again demonstrated. Evidence of bilateral chronic rotator cuff tears. Left shoulder degenerative changes.  IMPRESSION: Stable cardiomegaly and bilateral pleural effusions.   Electronically Signed   By: Beckie Salts M.D.   On: 12/27/2014 15:47   Ct Head Wo Contrast  01/10/2015   CLINICAL DATA:  Drowsiness  EXAM: CT HEAD WITHOUT CONTRAST  TECHNIQUE: Contiguous axial images were obtained from the base of the skull through the vertex without intravenous contrast.  COMPARISON:  None.  FINDINGS: Bony calvarium is intact. Mild atrophic changes are seen. No findings to suggest acute hemorrhage, acute infarction or space-occupying mass lesion are identified.  IMPRESSION: Mild atrophic changes without acute abnormality.   Electronically Signed   By: Alcide Clever M.D.   On: 01/10/2015 12:35   Mr Brain Wo Contrast  01/12/2015   CLINICAL DATA:  Acute encephalopathy  EXAM: MRI HEAD WITHOUT CONTRAST  TECHNIQUE: Multiplanar, multiecho pulse sequences of the brain and surrounding structures were obtained without intravenous contrast.  COMPARISON:  CT head 01/10/2015  FINDINGS: Image quality degraded by mild to moderate motion.  Generalized atrophy. Negative for hydrocephalus. Pituitary normal in size  Negative for acute infarct   Chronic microvascular ischemic changes in the white matter are mild. No cortical infarct. No brainstem lesion. Cerebellum intact  Negative for intracranial hemorrhage  12 mm calcified mass over the convexity on the left compatible with meningioma. No edema in the adjacent brain.  IMPRESSION: Atrophy and mild chronic microvascular ischemia.  No acute infarct  12 mm calcified meningioma over the convexity on the left.   Electronically Signed   By: Marlan Palau M.D.   On: 01/12/2015 10:02   US Renal  12/29/2014   CLINICAL DATA:  Acute renal failure  EXAM: RENAL/URINARY TRACT ULTRASOUND COMPLETE  COMPARISON:  None.  FINDINGS: Right Kidney:  Length: 9.1 cm. Diffuse cortical thinning is noted. No focal mass lesion or hydronephrosis is noted.  Left Kidney:  Length: 10.2 cm. Echogenicity within normal limits. No mass or hydronephrosis visualized.  Bladder:  Appears normal for degree of bladder distention.  A 3.2 cm hepatic cyst is noted. Small bilateral pleural effusions are noted.  IMPRESSION: Mild cortical thinning in the right kidney.  No mass lesion or hydronephrosis is noted.  Small bilateral pleural effusions.  Hepatic cyst.   Electronically Signed   By: Alcide Clever M.D.   On: 12/29/2014 11:28  Ir Fluoro Guide Cv Line Right  01/10/2015   CLINICAL DATA:  Multiple comorbidities including atrial fibrillation, hypertension, CHF, morbid obesity, poor peripheral access  EXAM: POWER PICC LINE PLACEMENT WITH ULTRASOUND AND FLUOROSCOPIC GUIDANCE  FLUOROSCOPY TIME:  30 seconds, 9 mGy  PROCEDURE: The patient was advised of the possible risks andcomplications and agreed to undergo the procedure. The patient was then brought to the angiographic suite for the procedure.  The right arm was prepped with chlorhexidine, drapedin the usual sterile fashion using maximum barrier technique (cap and mask, sterile gown, sterile gloves, large sterile sheet, hand hygiene and cutaneous antisepsis) and infiltrated locally with 1%  Lidocaine.  Ultrasound demonstrated patency of the right brachial vein, and this was documented with an image. Under real-time ultrasound guidance, this vein was accessed with a 21 gauge micropuncture needle and image documentation was performed. A 0.018 wire was introduced in to the vein. Over this, a 5 [place] double lumen power PICC was advanced to the lower SVC/right atrial junction. Fluoroscopy during the procedure and fluoro spot radiograph confirms appropriate catheter position. The catheter was flushed and covered with asterile dressing.  Catheter length: 38  Complications: None  IMPRESSION: Successful right arm power PICC line placement with ultrasound and fluoroscopic guidance. The catheter is ready for use.   Electronically Signed   By: Judie Petit.  Shick M.D.   On: 01/10/2015 12:56   Ir US Guide Vasc Access Right  01/10/2015   CLINICAL DATA:  Multiple comorbidities including atrial fibrillation, hypertension, CHF, morbid obesity, poor peripheral access  EXAM: POWER PICC LINE PLACEMENT WITH ULTRASOUND AND FLUOROSCOPIC GUIDANCE  FLUOROSCOPY TIME:  30 seconds, 9 mGy  PROCEDURE: The patient was advised of the possible risks andcomplications and agreed to undergo the procedure. The patient was then brought to the angiographic suite for the procedure.  The right arm was prepped with chlorhexidine, drapedin the usual sterile fashion using maximum barrier technique (cap and mask, sterile gown, sterile gloves, large sterile sheet, hand hygiene and cutaneous antisepsis) and infiltrated locally with 1% Lidocaine.  Ultrasound demonstrated patency of the right brachial vein, and this was documented with an image. Under real-time ultrasound guidance, this vein was accessed with a 21 gauge micropuncture needle and image documentation was performed. A 0.018 wire was introduced in to the vein. Over this, a 5 [place] double lumen power PICC was advanced to the lower SVC/right atrial junction. Fluoroscopy during the procedure and  fluoro spot radiograph confirms appropriate catheter position. The catheter was flushed and covered with asterile dressing.  Catheter length: 38  Complications: None  IMPRESSION: Successful right arm power PICC line placement with ultrasound and fluoroscopic guidance. The catheter is ready for use.   Electronically Signed   By: Judie Petit.  Shick M.D.   On: 01/10/2015 12:56   Dg Chest Port 1 View  01/17/2015   CLINICAL DATA:  Status post central line placement.  EXAM: PORTABLE CHEST - 1 VIEW  COMPARISON:  January 07, 2015.  FINDINGS: Stable cardiomegaly. Stable mild central pulmonary vascular congestion is noted. Interval placement of right-sided PICC line with distal tip overlying expected position of right brachycephalic vein. No pneumothorax is noted. Bilateral pleural effusions are noted. Right midlung opacity is noted consistent with subsegmental atelectasis or possibly pneumonia.  IMPRESSION: Interval placement of right-sided PICC line with distal tip overlying expected position of right brachycephalic vein. Bilateral pleural effusions are again noted with right midlung opacity concerning for subsegmental atelectasis or possibly pneumonia.   Electronically Signed   By: Fayrene Fearing  Christen Butter, M.D.   On: 01/17/2015 12:25   Dg Chest Port 1 View  01/07/2015   CLINICAL DATA:  Respiratory difficulty. Cough. Congestion. Cardiomegaly and CHF. No fever. No chest pain.  EXAM: PORTABLE CHEST - 1 VIEW  COMPARISON:  01/05/2015  FINDINGS: Heart is enlarged. There is moderate right pleural effusion associated right basilar atelectasis. Small left pleural effusion is present. There are changes of interstitial edema.  IMPRESSION: 1. Cardiomegaly and interstitial edema. 2. Persistent bibasilar opacity, right greater than left.   Electronically Signed   By: Norva Pavlov M.D.   On: 01/07/2015 18:02    PHYSICAL EXAM  the patient's daughter is in the room. The patient is overweight. She is oriented to person time and place. Affect is  normal. Lungs reveal scattered rhonchi. Cardiac exam reveals S1 and S2. It appears that the patient still has some edema in the upper aspects of her legs. She has support hose below the knee.   TELEMETRY: I have reviewed telemetry today Jan 22, 2015. There is atrial fibrillation with a controlled rate. There are PVCs.   ASSESSMENT AND PLAN:     Acute on chronic diastolic CHF (congestive heart failure),      The patient is on high-dose diuretics. Her blood pressure is low. She is not on any other significant medications lowering her blood pressure. I decided to stop metolazone and lower Lasix to 80 mg once a day IV. There has been a plan to proceed with right heart catheterization when the patient's volume status is stable. I'm not convinced that she's there yet. At the same time I feel that we need to cut the diuretic dose down today. Chemistry will be checked tomorrow. She will be reassessed tomorrow to decide if and when right heart catheterization can be planned.    Chronic a-fib      Atrial fib rate is controlled.    PVC's (premature ventricular contractions)   Willa Rough 01/22/2015 2:37 PM

## 2015-01-22 NOTE — Progress Notes (Signed)
[facility] PROGRESS NOTE  Melanie Camacho HYQ:[id number] DOB: 1932-03-29 DOA: 01/05/2015 PCP: PROVIDER NOT IN SYSTEM  Assessment/Plan: Principal Problem:   Acute on chronic diastolic CHF (congestive heart failure), NYHA class 1 - Patient is on a blocker - Patient on Lasix 80 mg IV daily - Has had transient hypotension - Cardiology on board: Patient may get right heart catheterization awaiting further evaluation recommendations from cards.  Active Problems:    Chronic a-fib/S/P AVR-(tissue) 2011 at [facility] - no CABG with this - Cardiology on board    HTN - Patient has had transient hypotension while in house we'll place holding orders on Lasix and beta blocker    Hypothyroid - stable on synthroid    DM (diabetes mellitus) - continue carb modified diet - Seems to be well controlled on diet    CKD (chronic kidney disease), stage III -Serum creatinine stable   Code Status: full Family Communication: No family at bedside Disposition Plan: Pending recommendations from cardiology on board   Consultants:  Cardiology  Procedures:  None  Antibiotics:  None  HPI/Subjective: Patient has no new complaints. No acute issues overnight   Objective: Filed Vitals:   01/22/15 1431  BP: 97/45  Pulse:   Resp:     Intake/Output Summary (Last 24 hours) at 01/22/15 1502 Last data filed at 01/22/15 0625  Gross per 24 hour  Intake      0 ml  Output   1450 ml  Net  -1450 ml   Filed Weights   01/20/15 0500 01/21/15 0630 01/22/15 0617  Weight: 95.165 kg (209 lb 12.8 oz) 97.16 kg (214 lb 3.2 oz) 96.571 kg (212 lb 14.4 oz)    Exam:   General:  Pt in nad, alert and awake  Cardiovascular: Irregularly irregular, no rubs  Respiratory: cta bl, no wheezes  Abdomen: soft, NT, ND, no guarding  Musculoskeletal: no cyanosis or clubbing   Data Reviewed: Basic Metabolic Panel:  Recent Labs Lab 01/17/15 0603 01/18/15 0306 01/20/15 0505 01/21/15 0511 01/22/15 0535  NA  140 138 136 139 138  K 4.0 4.4 3.6 3.7 3.4*  CL 94* 94* 89* 90* 89*  CO2 38* 32 38* 38* 39*  GLUCOSE 65* 75 78 90 78  BUN 24* 24* 27* 27* 28*  CREATININE 1.05* 1.01* 1.19* 1.16* 1.16*  CALCIUM 9.2 8.9 8.7* 8.8* 8.5*   Liver Function Tests: No results for input(s): AST, ALT, ALKPHOS, BILITOT, PROT, ALBUMIN in the last 168 hours. No results for input(s): LIPASE, AMYLASE in the last 168 hours.  Recent Labs Lab 01/17/15 1557  AMMONIA 33   CBC:  Recent Labs Lab 01/16/15 0500 01/20/15 0505  WBC 3.8* 3.8*  HGB 10.6* 9.7*  HCT 34.5* 31.5*  MCV 102.7* 101.6*  PLT 110* 101*   Cardiac Enzymes: No results for input(s): CKTOTAL, CKMB, CKMBINDEX, TROPONINI in the last 168 hours. BNP (last 3 results)  Recent Labs  12/14/14 1410 12/27/14 0449 01/05/15 1528  BNP 1222.9* 1099.7* 972.1*    ProBNP (last 3 results) No results for input(s): PROBNP in the last 8760 hours.  CBG:  Recent Labs Lab 01/21/15 1143 01/21/15 1700 01/21/15 2107 01/22/15 0619 01/22/15 1210  GLUCAP 98 83 93 70 93    Recent Results (from the past 240 hour(s))  Clostridium Difficile by PCR     Status: None   Collection Time: 01/12/15  4:44 PM  Result Value Ref Range Status   C difficile by pcr NEGATIVE NEGATIVE Final     Studies:  No results found.  Scheduled Meds: . antiseptic oral rinse  7 mL Mouth Rinse BID  . apixaban  5 mg Oral BID  . bisoprolol  2.5 mg Oral Daily  . colesevelam  1,875 mg Oral BID WC  . ferrous fumarate  1 tablet Oral Daily  . [START ON 01/23/2015] furosemide  80 mg Intravenous Daily  . gabapentin  100 mg Oral TID  . lactulose  10 g Oral TID  . levothyroxine  50 mcg Oral QAC breakfast  . multivitamin  2 tablet Oral Daily  . pantoprazole  40 mg Oral BID  . potassium chloride  40 mEq Oral BID  . sodium chloride  10-40 mL Intracatheter Q12H  . sodium chloride  3 mL Intravenous Q12H  . valACYclovir  1,000 mg Oral TID   Continuous Infusions:    Time spent: > 35  minutes    Penny Pia  [facility] Pager [phone] If 7PM-7AM, please contact night-coverage at [email or web address], password [facility] 01/22/2015, 3:02 PM  LOS: 17 days

## 2015-01-23 LAB — GLUCOSE, CAPILLARY
GLUCOSE-CAPILLARY: 104 mg/dL — AB (ref 65–99)
GLUCOSE-CAPILLARY: 108 mg/dL — AB (ref 65–99)
Glucose-Capillary: 69 mg/dL (ref 65–99)
Glucose-Capillary: 77 mg/dL (ref 65–99)
Glucose-Capillary: 112 mg/dL — ABNORMAL HIGH (ref 65–99)

## 2015-01-23 LAB — BASIC METABOLIC PANEL
ANION GAP: 8 (ref 5–15)
BUN: 27 mg/dL — AB (ref 6–20)
CALCIUM: 8.3 mg/dL — AB (ref 8.9–10.3)
CHLORIDE: 92 mmol/L — AB (ref 101–111)
CO2: 37 mmol/L — AB (ref 22–32)
Creatinine, Ser: 1.17 mg/dL — ABNORMAL HIGH (ref 0.44–1.00)
GFR calc Af Amer: 50 mL/min — ABNORMAL LOW (ref >60)
GFR calc non Af Amer: 43 mL/min — ABNORMAL LOW (ref >60)
Glucose, Bld: 113 mg/dL — ABNORMAL HIGH (ref 65–99)
Potassium: 4.4 mmol/L (ref 3.5–5.1)
Sodium: 137 mmol/L (ref 135–145)

## 2015-01-23 LAB — CBC
HCT: 32.4 % — ABNORMAL LOW (ref 36.0–46.0)
Hemoglobin: 9.9 g/dL — ABNORMAL LOW (ref 12.0–15.0)
MCH: 30.9 pg (ref 26.0–34.0)
MCHC: 30.6 g/dL (ref 30.0–36.0)
MCV: 101.3 fL — ABNORMAL HIGH (ref 78.0–100.0)
PLATELETS: 153 10*3/uL (ref 150–400)
RBC: 3.2 MIL/uL — ABNORMAL LOW (ref 3.87–5.11)
RDW: 18.2 % — AB (ref 11.5–15.5)
WBC: 4.5 10*3/uL (ref 4.0–10.5)

## 2015-01-23 LAB — CARBOXYHEMOGLOBIN
CARBOXYHEMOGLOBIN: 2 % — AB (ref 0.5–1.5)
METHEMOGLOBIN: 1.1 % (ref 0.0–1.5)
O2 Saturation: 69.3 %
TOTAL HEMOGLOBIN: 10.1 g/dL — AB (ref 12.0–16.0)

## 2015-01-23 MED ORDER — POTASSIUM CHLORIDE CRYS ER 20 MEQ PO TBCR
40.0000 meq | EXTENDED_RELEASE_TABLET | Freq: Once | ORAL | Status: AC
  Administered 2015-01-23: 40 meq via ORAL

## 2015-01-23 MED ORDER — FUROSEMIDE 10 MG/ML IJ SOLN
80.0000 mg | Freq: Two times a day (BID) | INTRAMUSCULAR | Status: DC
  Administered 2015-01-24 – 2015-01-25 (×3): 80 mg via INTRAVENOUS
  Filled 2015-01-23 (×5): qty 8

## 2015-01-23 NOTE — Progress Notes (Signed)
Pt picc line dislodged this am,  Notified Dr Cena Benton . Pt no access for iv lasix r/t later.  MD instructed to call cardiologist.  PA Clegg, paged and answering service paged.  Awaiting return call/  Will continue to monitor.  Melanie Camacho, Charity fundraiser.

## 2015-01-23 NOTE — Progress Notes (Addendum)
Primary cardiologist: Dr. Nicki Guadalajara  Seen for followup: Diastolic heart failure with RV dysfunction, atrial fibrillation  Subjective:    Shingles improved. Continues to diurese with IV lasix but due to soft SBP IV lasix was cut back to 80 mg daily.  Weight up 1 pound. PICC out this am.    Mild dyspnea with exertion.   Objective:   Temp:  [97.5 F (36.4 C)] 97.5 F (36.4 C) (05/16 0600) Pulse Rate:  [58-116] 104 (05/16 0600) Resp:  [18] 18 (05/16 0600) BP: (92-101)/(44-60) 92/57 mmHg (05/16 0600) SpO2:  [87 %-100 %] 98 % (05/16 0600) Weight:  [213 lb 3.2 oz (96.707 kg)] 213 lb 3.2 oz (96.707 kg) (05/16 0600) Last BM Date: 01/22/15  Filed Weights   01/21/15 0630 01/22/15 0617 01/23/15 0600  Weight: 214 lb 3.2 oz (97.16 kg) 212 lb 14.4 oz (96.571 kg) 213 lb 3.2 oz (96.707 kg)    Intake/Output Summary (Last 24 hours) at 01/23/15 1033 Last data filed at 01/23/15 0600  Gross per 24 hour  Intake    950 ml  Output   1575 ml  Net   -625 ml    Telemetry: Atrial fibrillation 90s.    Exam: General: Obese woman, lying in bed.  Lungs: Decreased in the bases. Mild crackles toward the bases. Cardiac: JVP 12 cm , irregularly irregular. 1/6 SEM  Back: Shingles rash on left scapula Abdomen: Obese, soft, active bowel sounds.  Extremities: Lower legs wrapped, 1+ edema to knees bilaterally.   GU: Foley   Lab Results:  Basic Metabolic Panel:  Recent Labs Lab 01/20/15 0505 01/21/15 0511 01/22/15 0535  NA 136 139 138  K 3.6 3.7 3.4*  CL 89* 90* 89*  CO2 38* 38* 39*  GLUCOSE 78 90 78  BUN 27* 27* 28*  CREATININE 1.19* 1.16* 1.16*  CALCIUM 8.7* 8.8* 8.5*    Liver Function Tests: No results for input(s): AST, ALT, ALKPHOS, BILITOT, PROT, ALBUMIN in the last 168 hours.  CBC:  Recent Labs Lab 01/20/15 0505  WBC 3.8*  HGB 9.7*  HCT 31.5*  MCV 101.6*  PLT 101*    Cardiac Enzymes: No results for input(s): CKTOTAL, CKMB, CKMBINDEX, TROPONINI in the last 168  hours.  Limited echocardiogram 01/11/2015: Study Conclusions  - Left ventricle: The cavity size was normal. There was mild concentric hypertrophy. Systolic function was mildly reduced. The estimated ejection fraction was in the range of 45% to 50%. Possible hypokinesis of the entireinferolateral myocardium. - Aortic valve: The AV is not visualized adequately to comment on. There was mild regurgitation. - Mitral valve: Calcified annulus. Moderate thickening. There was mild regurgitation. - Right ventricle: RV was not measured by appears dilated. Systolic function was moderately to severely reduced. - Pulmonary arteries: PA peak pressure: 46 mm Hg (S). - Pericardium, extracardiac: There was a left pleural effusion.  Impressions:  - The right ventricular systolic pressure was increased consistent with moderate pulmonary hypertension.    Medications:   Scheduled Medications: . antiseptic oral rinse  7 mL Mouth Rinse BID  . apixaban  5 mg Oral BID  . bisoprolol  2.5 mg Oral Daily  . colesevelam  1,875 mg Oral BID WC  . ferrous fumarate  1 tablet Oral Daily  . furosemide  80 mg Intravenous Daily  . gabapentin  100 mg Oral TID  . lactulose  10 g Oral TID  . levothyroxine  50 mcg Oral QAC breakfast  . multivitamin  2 tablet Oral Daily  .  pantoprazole  40 mg Oral BID  . potassium chloride  40 mEq Oral BID  . sodium chloride  10-40 mL Intracatheter Q12H  . sodium chloride  3 mL Intravenous Q12H  . valACYclovir  1,000 mg Oral TID     PRN Medications: sodium chloride, acetaminophen, gi cocktail, ondansetron (ZOFRAN) IV, oxyCODONE, phenazopyridine, sodium chloride, sodium chloride, sodium chloride   Assessment:   1. Acute on chronic diastolic HF R>>L with anasarca. Urine output has increased substantially on IV Lasix and metolazone.   2. Acute on chronic RV failure with acute cor pulmonale. PASP 46 mmHg by echocardiogram.  3. AoV disease s/p bioprosthetic  AVR.  4. Morbid obesity.  5. Chronic AF.  6. Hypoalbuminemia- Albumin 2.7.  7. PAF - Eliquis. Mild nose bleed. Resolved.   8. Acute shingles - per primary team  9. Frequent PVCs   Plan/Discussion:    Volume status stable.  Renal function stable. Still with volume on board. Continue current dose of lasix. BMET pending. May need to back off.    Chronic A fib. Rate controlled. Continue  BB and eliquis.   CLEGG,AMY, NP   Patient seen with NP, agree with the above note.  Lasix was decreased to 80 mg IV once daily yesterday because of soft BP.  SBP consistently in 90s.  She is volume overloaded on exam still.  I will increase Lasix to 80 mg IV bid today.  Need labs today (PICC line out) so will need draw by phlebotomy.  Will replace PICC.   Marca Ancona 01/23/2015 2:05 PM

## 2015-01-23 NOTE — Progress Notes (Signed)
Dr. Shirlee Latch notified pt picc line is out and pt has not received her iv lasix yet,MD  instructed to placed another picc or iv access until picc is placed.  Amanda Pea, Charity fundraiser.

## 2015-01-23 NOTE — Progress Notes (Signed)
[facility] PROGRESS NOTE  Melanie Camacho SEL:[id number] DOB: [birth year] DOA: 01/05/2015 PCP: PROVIDER NOT IN SYSTEM  Assessment/Plan: Principal Problem:   Acute on chronic diastolic CHF (congestive heart failure), NYHA class 1 - Management per cardiology - Picc line lost, will place order for picc line insertion by IR since nursing is unable to complete.  Active Problems:    Chronic a-fib/S/P AVR-(tissue) 2011 at [facility] - no CABG with this - Cardiology on board and managing.    HTN - Holding orders placed for diuretic and beta blocker given recent hypotension    Hypothyroid - stable on synthroid    DM (diabetes mellitus) - continue carb modified diet - Seems to be well controlled on diet    CKD (chronic kidney disease), stage III -Serum creatinine stable   Code Status: full Family Communication: No family at bedside Disposition Plan: Pending recommendations from cardiology on board   Consultants:  Cardiology  Procedures:  None  Antibiotics:  None  HPI/Subjective: No acute issues reported overnight  Objective: Filed Vitals:   01/23/15 1647  BP: 101/52  Pulse: 99  Temp: 97.7 F (36.5 C)  Resp: 22    Intake/Output Summary (Last 24 hours) at 01/23/15 1709 Last data filed at 01/23/15 1615  Gross per 24 hour  Intake   1250 ml  Output   1175 ml  Net     75 ml   Filed Weights   01/22/15 0617 01/23/15 0600 01/23/15 1647  Weight: 212 lb 14.4 oz (96.571 kg) 213 lb 3.2 oz (96.707 kg) 219 lb 2.2 oz (99.4 kg)    Exam:   General:  Pt in nad, alert and awake  Cardiovascular: Irregularly irregular, no rubs  Respiratory: cta bl, no wheezes  Abdomen: soft, NT, ND, no guarding  Musculoskeletal: no cyanosis or clubbing   Data Reviewed: Basic Metabolic Panel:  Recent Labs Lab 01/18/15 0306 01/20/15 0505 01/21/15 0511 01/22/15 0535 01/23/15 1516  NA 138 136 139 138 137  K 4.4 3.6 3.7 3.4* 4.4  CL 94* 89* 90* 89* 92*  CO2 32 38* 38* 39* 37*   GLUCOSE 75 78 90 78 113*  BUN 24* 27* 27* 28* 27*  CREATININE 1.01* 1.19* 1.16* 1.16* 1.17*  CALCIUM 8.9 8.7* 8.8* 8.5* 8.3*   Liver Function Tests: No results for input(s): AST, ALT, ALKPHOS, BILITOT, PROT, ALBUMIN in the last 168 hours. No results for input(s): LIPASE, AMYLASE in the last 168 hours.  Recent Labs Lab 01/17/15 1557  AMMONIA 33   CBC:  Recent Labs Lab 01/20/15 0505 01/23/15 1516  WBC 3.8* 4.5  HGB 9.7* 9.9*  HCT 31.5* 32.4*  MCV 101.6* 101.3*  PLT 101* 153   Cardiac Enzymes: No results for input(s): CKTOTAL, CKMB, CKMBINDEX, TROPONINI in the last 168 hours. BNP (last 3 results)  Recent Labs  12/14/14 1410 12/27/14 0449 01/05/15 1528  BNP 1222.9* 1099.7* 972.1*    ProBNP (last 3 results) No results for input(s): PROBNP in the last 8760 hours.  CBG:  Recent Labs Lab 01/22/15 1659 01/22/15 2153 01/23/15 0623 01/23/15 0743 01/23/15 1208  GLUCAP 85 98 69 77 104*    No results found for this or any previous visit (from the past 240 hour(s)).   Studies: No results found.  Scheduled Meds: . antiseptic oral rinse  7 mL Mouth Rinse BID  . apixaban  5 mg Oral BID  . bisoprolol  2.5 mg Oral Daily  . colesevelam  1,875 mg Oral BID WC  . ferrous  fumarate  1 tablet Oral Daily  . furosemide  80 mg Intravenous BID  . gabapentin  100 mg Oral TID  . lactulose  10 g Oral TID  . levothyroxine  50 mcg Oral QAC breakfast  . multivitamin  2 tablet Oral Daily  . pantoprazole  40 mg Oral BID  . potassium chloride  40 mEq Oral BID  . sodium chloride  10-40 mL Intracatheter Q12H  . sodium chloride  3 mL Intravenous Q12H  . valACYclovir  1,000 mg Oral TID   Continuous Infusions:    Time spent: > 35 minutes    Penny Pia  [facility] Pager [phone] If 7PM-7AM, please contact night-coverage at [email or web address], password [facility] 01/23/2015, 5:09 PM  LOS: 18 days

## 2015-01-23 NOTE — Progress Notes (Signed)
Reported called to Harland German, RN at [facility].  Pt to be moved to 2C1 as isolation room for airborne not working.  Also informed nurse that pt;s picc out this am. And order to reinsert picc, iv team had called to have picc placed by IR as they had problem inserting one.  Nurse   verbalized understanding.   Amanda Pea, RN

## 2015-01-23 NOTE — Progress Notes (Signed)
Pt was experiencing transient heart rates in the 20's.  12 lead EKG was taken and the MD was notified

## 2015-01-23 NOTE — Progress Notes (Signed)
CBG 69, gave OJ will recheck again shortly, will continue to monitor, thanks MikeF RN

## 2015-01-24 ENCOUNTER — Inpatient Hospital Stay (HOSPITAL_COMMUNITY): Payer: Medicare Other

## 2015-01-24 ENCOUNTER — Other Ambulatory Visit (HOSPITAL_COMMUNITY): Payer: Medicare Other

## 2015-01-24 DIAGNOSIS — R0600 Dyspnea, unspecified: Secondary | ICD-10-CM

## 2015-01-24 LAB — BASIC METABOLIC PANEL
Anion gap: 10 (ref 5–15)
BUN: 25 mg/dL — AB (ref 6–20)
CO2: 35 mmol/L — ABNORMAL HIGH (ref 22–32)
CREATININE: 1.13 mg/dL — AB (ref 0.44–1.00)
Calcium: 8.4 mg/dL — ABNORMAL LOW (ref 8.9–10.3)
Chloride: 92 mmol/L — ABNORMAL LOW (ref 101–111)
GFR calc Af Amer: 52 mL/min — ABNORMAL LOW (ref >60)
GFR calc non Af Amer: 45 mL/min — ABNORMAL LOW (ref >60)
Glucose, Bld: 85 mg/dL (ref 65–99)
Potassium: 4.9 mmol/L (ref 3.5–5.1)
Sodium: 137 mmol/L (ref 135–145)

## 2015-01-24 LAB — GLUCOSE, CAPILLARY
GLUCOSE-CAPILLARY: 76 mg/dL (ref 65–99)
GLUCOSE-CAPILLARY: 82 mg/dL (ref 65–99)
Glucose-Capillary: 84 mg/dL (ref 65–99)
Glucose-Capillary: 104 mg/dL — ABNORMAL HIGH (ref 65–99)

## 2015-01-24 MED ORDER — POTASSIUM CHLORIDE CRYS ER 20 MEQ PO TBCR
40.0000 meq | EXTENDED_RELEASE_TABLET | Freq: Every day | ORAL | Status: DC
  Administered 2015-01-25: 40 meq via ORAL
  Filled 2015-01-24 (×3): qty 2

## 2015-01-24 MED ORDER — METOLAZONE 5 MG PO TABS
5.0000 mg | ORAL_TABLET | Freq: Every day | ORAL | Status: DC
  Administered 2015-01-24 – 2015-01-25 (×2): 5 mg via ORAL
  Filled 2015-01-24 (×2): qty 1

## 2015-01-24 NOTE — Discharge Instructions (Signed)

## 2015-01-24 NOTE — Progress Notes (Signed)
Patient has been transferred to [facility] from [facility].  Continue SNF search for bed in [facility]. Fl2 will need to be updated and re-faxed to indicate current medical issues.  Written handoff will be provided to receiving CSW- [facility], LCSWA.  Lorri Frederick. Jaci Lazier, [place] [phone]

## 2015-01-24 NOTE — Progress Notes (Signed)
  Echocardiogram 2D Echocardiogram has been performed.  Delcie Roch 01/24/2015, 5:58 PM

## 2015-01-24 NOTE — Progress Notes (Signed)
[facility] PROGRESS NOTE  Melanie Camacho EAV:[id number] DOB: 1931/10/27 DOA: 01/05/2015 PCP: PROVIDER NOT IN SYSTEM   Brief Narrative: Patient is a 81 year old female with hypertension, diabetes, diet controlled, DM, CAD, AVR, stage 3 CKD, afib on eliquis, presented on 4/28 for abdominal pain and shortness of breath. She was evaluated with a CT abdomen and pelvis , which did not reveal any acute intra abdominal findings. There was severe anasarca with moderate right and small left pleural effusions. Patient was admitted for acute on chronic diastolic heart failure and started on IV lasix. From 4/30 and 5/1, patient was hypotensive, tachycardic and her lasix and BB was held. Cardiology and heart failure team were consulted for recommendations.  Patient was noted to be lethargic on 5/3, CT head was negative for acute intracranial pathology, ABG did not show acute hypercapnia, PCO2 of 50, ammonia level was elevated at 91 and started on lactulose 5/5 patient continued to be somnolent, MRI of the brain was obtained which was negative for acute infarct.. Medications were adjusted, discontinued Neurontin and Requip. Lactulose was increased 5/6, 5,7: Patient's mental status continues to improve, still very deconditioned, anasarca with fluid overload  5/8: Developed dermatomal rash on the left breast and radiating to the left back, started on Valtrex 5/10 frank diarrhea in the rectal tube, hence lactulose was decreased on 5/9. The patient has completed treatment for shingles outbreak and currently is awaiting final recommendations from cardiology    Assessment/Plan: Principal Problem:   Acute on chronic diastolic CHF (congestive heart failure), NYHA class 1 - Management per cardiology - Diuresis per cardiology  Active Problems:    Chronic a-fib/S/P AVR-(tissue) 2011 at [facility] - no CABG with this - Cardiology on board and managing.  Shingles - Patient has been treated appropriately with bowel  acyclovir and has completed more than 7 days of therapy. As such will discontinue. Per my discussion with infectious disease Dr. patient may be taken off of contact precautions once the wound scabs over.    HTN - Continue holding orders placed for diuretic and beta blocker given recent hypotension    Hypothyroid - stable continue synthroid    DM (diabetes mellitus) - continue carb modified diet - Seems to be well controlled on diet    CKD (chronic kidney disease), stage III -Serum creatinine stable   Code Status: full Family Communication: No family at bedside Disposition Plan: Pending recommendations from cardiology on board   Consultants:  Cardiology  Procedures:  None  Antibiotics:  None  HPI/Subjective: No acute issues reported overnight. No new complaints  Objective: Filed Vitals:   01/24/15 0839  BP: 91/43  Pulse: 98  Temp: 97.6 F (36.4 C)  Resp: 14    Intake/Output Summary (Last 24 hours) at 01/24/15 1312 Last data filed at 01/24/15 1037  Gross per 24 hour  Intake    353 ml  Output   1000 ml  Net   -647 ml   Filed Weights   01/22/15 0617 01/23/15 0600 01/23/15 1647  Weight: 212 lb 14.4 oz (96.571 kg) 213 lb 3.2 oz (96.707 kg) 219 lb 2.2 oz (99.4 kg)    Exam:   General:  Pt in nad, alert and awake  Cardiovascular: Irregularly irregular, no rubs  Respiratory: cta bl, no wheezes  Abdomen: soft, NT, ND, no guarding  Musculoskeletal: no cyanosis or clubbing   Data Reviewed: Basic Metabolic Panel:  Recent Labs Lab 01/20/15 0505 01/21/15 0511 01/22/15 0535 01/23/15 1516 01/24/15 0325  NA 136 139  138 137 137  K 3.6 3.7 3.4* 4.4 4.9  CL 89* 90* 89* 92* 92*  CO2 38* 38* 39* 37* 35*  GLUCOSE 78 90 78 113* 85  BUN 27* 27* 28* 27* 25*  CREATININE 1.19* 1.16* 1.16* 1.17* 1.13*  CALCIUM 8.7* 8.8* 8.5* 8.3* 8.4*   Liver Function Tests: No results for input(s): AST, ALT, ALKPHOS, BILITOT, PROT, ALBUMIN in the last 168 hours. No  results for input(s): LIPASE, AMYLASE in the last 168 hours.  Recent Labs Lab 01/17/15 1557  AMMONIA 33   CBC:  Recent Labs Lab 01/20/15 0505 01/23/15 1516  WBC 3.8* 4.5  HGB 9.7* 9.9*  HCT 31.5* 32.4*  MCV 101.6* 101.3*  PLT 101* 153   Cardiac Enzymes: No results for input(s): CKTOTAL, CKMB, CKMBINDEX, TROPONINI in the last 168 hours. BNP (last 3 results)  Recent Labs  12/14/14 1410 12/27/14 0449 01/05/15 1528  BNP 1222.9* 1099.7* 972.1*    ProBNP (last 3 results) No results for input(s): PROBNP in the last 8760 hours.  CBG:  Recent Labs Lab 01/23/15 0743 01/23/15 1208 01/23/15 1658 01/23/15 2115 01/24/15 0841  GLUCAP 77 104* 108* 112* 76    No results found for this or any previous visit (from the past 240 hour(s)).   Studies: No results found.  Scheduled Meds: . antiseptic oral rinse  7 mL Mouth Rinse BID  . apixaban  5 mg Oral BID  . bisoprolol  2.5 mg Oral Daily  . colesevelam  1,875 mg Oral BID WC  . ferrous fumarate  1 tablet Oral Daily  . furosemide  80 mg Intravenous BID  . gabapentin  100 mg Oral TID  . lactulose  10 g Oral TID  . levothyroxine  50 mcg Oral QAC breakfast  . multivitamin  2 tablet Oral Daily  . pantoprazole  40 mg Oral BID  . [START ON 01/25/2015] potassium chloride  40 mEq Oral Daily  . sodium chloride  10-40 mL Intracatheter Q12H  . sodium chloride  3 mL Intravenous Q12H   Continuous Infusions:    Time spent: > 35 minutes    Penny Pia  [facility] Pager [phone] If 7PM-7AM, please contact night-coverage at [email or web address], password [facility] 01/24/2015, 1:12 PM  LOS: 19 days

## 2015-01-24 NOTE — Progress Notes (Addendum)
Physical Therapy Treatment Patient Details Name: Melanie Camacho MRN: [id number] DOB: 04-Apr-1932 Today's Date: 01/24/2015    History of Present Illness Pt is an 81 y./o female recently admitted to similar s/s of CHF, now back from SNF rehab with CHF.    PT Comments    Pt admitted with above diagnosis. Pt currently with functional limitations due to balance and endurance deficits as well as weakness.  Pt met 1/4 goals set initially.  Slow progress due to fluctuating edema.  Goals revised.  Continue PT and progress as able.   Pt will benefit from skilled PT to increase their independence and safety with mobility to allow discharge to the venue listed below.    Follow Up Recommendations  SNF     Equipment Recommendations  None recommended by PT    Recommendations for Other Services OT consult     Precautions / Restrictions Precautions Precautions: Fall Precaution Comments: very fearful of falling Restrictions Weight Bearing Restrictions: No    Mobility  Bed Mobility Overal bed mobility: Needs Assistance;+2 for physical assistance Bed Mobility: Rolling;Sidelying to Sit Rolling: Mod assist;+2 for physical assistance Sidelying to sit: Mod assist;+2 for physical assistance;HOB elevated Supine to sit: Mod assist;+2 for physical assistance     General bed mobility comments: Pt needed less assist today.  Pt able to move LEs with a little assist.  Needed assist to initiate movement as well as to complete movement of LEs.  Used bed pad to assist hips to EOB.  Had to use pad to assist with elevation of trunk.   Transfers Overall transfer level: Needs assistance Equipment used: Rolling walker (2 wheeled) Transfers: Sit to/from Stand Sit to Stand: Mod assist;+2 physical assistance;From elevated surface Stand pivot transfers: Min assist;Mod assist;+2 physical assistance;From elevated surface       General transfer comment: Assist for anterior lean and to power up.  did better with use  of counting with use of momentum.   Pt needed min cues to move LEs but with incr time pt was able to take  several steps forward.  Pt was able to reach back and control descent into chair.    Ambulation/Gait Ambulation/Gait assistance: Min assist;+2 physical assistance Ambulation Distance (Feet): 7 Feet Assistive device: Rolling walker (2 wheeled) Gait Pattern/deviations: Shuffle;Decreased step length - right;Decreased step length - left;Step-to pattern;Trunk flexed;Wide base of support Gait velocity: decr Gait velocity interpretation: Below normal speed for age/gender General Gait Details: Takes incr time to ambulate with slow guarded steps.  Pt is able to weight shift on her own but takes incr time to move RW and LEs leaving right foot behind.  Also needs cues to stand tall as she keeps posture flexed.     Stairs            Wheelchair Mobility    Modified Rankin (Stroke Patients Only)       Balance Overall balance assessment: Needs assistance;History of Falls Sitting-balance support: Bilateral upper extremity supported;Feet supported Sitting balance-Leahy Scale: Fair Sitting balance - Comments: Can sit EOB with UE support once scooted out with pad. Postural control: Posterior lean Standing balance support: Bilateral upper extremity supported;During functional activity Standing balance-Leahy Scale: Poor Standing balance comment: Requires UE support on RW for static balance.                     Cognition Arousal/Alertness: Awake/alert Behavior During Therapy: Flat affect Overall Cognitive Status: Within Functional Limits for tasks assessed  Exercises General Exercises - Lower Extremity Ankle Circles/Pumps: AROM;Both;10 reps;Supine Long Arc Quad: AROM;Both;10 reps;Seated Hip Flexion/Marching: AROM;Both;10 reps;Seated    General Comments        Pertinent Vitals/Pain Pain Assessment: Faces Faces Pain Scale: Hurts a little  bit Pain Location: shingles, shoulders Pain Descriptors / Indicators: Aching;Grimacing;Guarding Pain Intervention(s): Limited activity within patient's tolerance;Monitored during session;Repositioned  HR 105-112 bpm, O2 100% on 4LO2 but dropped to 87% on RA with activity.  91/43 was BP initially.  111/61 on departure.  18-28 RR.      Home Living                      Prior Function            PT Goals (current goals can now be found in the care plan section) Acute Rehab PT Goals PT Goal Formulation: With patient Time For Goal Achievement: 02/07/15 Potential to Achieve Goals: Fair Progress towards PT goals: Progressing toward goals    Frequency  Min 2X/week    PT Plan Current plan remains appropriate    Co-evaluation             End of Session Equipment Utilized During Treatment: Gait belt;Oxygen Activity Tolerance: Patient limited by fatigue Patient left: in chair;with call bell/phone within reach     Time: 0914-0940 PT Time Calculation (min) (ACUTE ONLY): 26 min  Charges:  $Gait Training: 8-22 mins $Therapeutic Activity: 8-22 mins                    G CodesIrwin Brakeman F 06-Feb-2015, 12:03 PM Nayely Dingus,PT Acute Rehabilitation [phone] (pager)

## 2015-01-24 NOTE — Progress Notes (Signed)
Primary cardiologist: Dr. Nicki Guadalajara  Seen for followup: Diastolic heart failure with RV dysfunction, atrial fibrillation  Subjective:    Shingles improved.  Lasix increased back to 80 IV bid yesterday. Weights inaccurate. PICC out. Using PIV. Denies dyspnea. SBP 90-100   Objective:   Temp:  [97.6 F (36.4 C)-98.2 F (36.8 C)] 97.6 F (36.4 C) (05/17 0839) Pulse Rate:  [75-99] 98 (05/17 0839) Resp:  [8-22] 14 (05/17 0839) BP: (91-130)/(43-87) 91/43 mmHg (05/17 0839) SpO2:  [97 %-100 %] 99 % (05/17 0839) Weight:  [99.4 kg (219 lb 2.2 oz)] 99.4 kg (219 lb 2.2 oz) (05/16 1647) Last BM Date: 01/21/15  Filed Weights   01/22/15 0617 01/23/15 0600 01/23/15 1647  Weight: 96.571 kg (212 lb 14.4 oz) 96.707 kg (213 lb 3.2 oz) 99.4 kg (219 lb 2.2 oz)    Intake/Output Summary (Last 24 hours) at 01/24/15 1302 Last data filed at 01/24/15 1037  Gross per 24 hour  Intake    353 ml  Output   1000 ml  Net   -647 ml    Telemetry: Atrial fibrillation 90s.    Exam: General: Obese woman, sitting in chair.  Lungs: Decreased clear Cardiac: JVP 10-12 cm , irregularly irregular. 1/6 SEM  Back: Shingles rash on left scapula - healing Abdomen: Obese, soft, active bowel sounds.  Extremities: Lower legs wrapped, 1-2+ edema to knees bilaterally.   GU: Foley   Lab Results:  Basic Metabolic Panel:  Recent Labs Lab 01/22/15 0535 01/23/15 1516 01/24/15 0325  NA 138 137 137  K 3.4* 4.4 4.9  CL 89* 92* 92*  CO2 39* 37* 35*  GLUCOSE 78 113* 85  BUN 28* 27* 25*  CREATININE 1.16* 1.17* 1.13*  CALCIUM 8.5* 8.3* 8.4*    Liver Function Tests: No results for input(s): AST, ALT, ALKPHOS, BILITOT, PROT, ALBUMIN in the last 168 hours.  CBC:  Recent Labs Lab 01/20/15 0505 01/23/15 1516  WBC 3.8* 4.5  HGB 9.7* 9.9*  HCT 31.5* 32.4*  MCV 101.6* 101.3*  PLT 101* 153    Cardiac Enzymes: No results for input(s): CKTOTAL, CKMB, CKMBINDEX, TROPONINI in the last 168  hours.  Limited echocardiogram 01/11/2015: Study Conclusions  - Left ventricle: The cavity size was normal. There was mild concentric hypertrophy. Systolic function was mildly reduced. The estimated ejection fraction was in the range of 45% to 50%. Possible hypokinesis of the entireinferolateral myocardium. - Aortic valve: The AV is not visualized adequately to comment on. There was mild regurgitation. - Mitral valve: Calcified annulus. Moderate thickening. There was mild regurgitation. - Right ventricle: RV was not measured by appears dilated. Systolic function was moderately to severely reduced. - Pulmonary arteries: PA peak pressure: 46 mm Hg (S). - Pericardium, extracardiac: There was a left pleural effusion.  Impressions:  - The right ventricular systolic pressure was increased consistent with moderate pulmonary hypertension.    Medications:   Scheduled Medications: . antiseptic oral rinse  7 mL Mouth Rinse BID  . apixaban  5 mg Oral BID  . bisoprolol  2.5 mg Oral Daily  . colesevelam  1,875 mg Oral BID WC  . ferrous fumarate  1 tablet Oral Daily  . furosemide  80 mg Intravenous BID  . gabapentin  100 mg Oral TID  . lactulose  10 g Oral TID  . levothyroxine  50 mcg Oral QAC breakfast  . multivitamin  2 tablet Oral Daily  . pantoprazole  40 mg Oral BID  . potassium chloride  40 mEq Oral BID  . sodium chloride  10-40 mL Intracatheter Q12H  . sodium chloride  3 mL Intravenous Q12H     PRN Medications: sodium chloride, acetaminophen, gi cocktail, ondansetron (ZOFRAN) IV, oxyCODONE, phenazopyridine, sodium chloride, sodium chloride, sodium chloride   Assessment:   1. Acute on chronic diastolic HF R>>L with anasarca. Urine output has increased substantially on IV Lasix and metolazone.   2. Acute on chronic RV failure with acute cor pulmonale. PASP 46 mmHg by echocardiogram.  3. AoV disease s/p bioprosthetic AVR.  4. Morbid obesity.  5. Chronic  AF.  6. Hypoalbuminemia- Albumin 2.7.  7. PAF - Eliquis. Mild nose bleed. Resolved.   8. Acute shingles - per primary team  9. Frequent PVCs   Plan/Discussion:    Volume status stable.  Renal function stable. Still with volume on board. Continue current dose of lasix. Add metolazone today.   Will repeat echo. If pulmonary pressures improved may be able to defer RHC. She would like to avoid if possible. Ok to leave PICC out.   Arvilla Meres MD 01/24/2015 1:02 PM

## 2015-01-25 ENCOUNTER — Inpatient Hospital Stay (HOSPITAL_COMMUNITY): Payer: Medicare Other

## 2015-01-25 LAB — BASIC METABOLIC PANEL
ANION GAP: 9 (ref 5–15)
BUN: 26 mg/dL — AB (ref 6–20)
CHLORIDE: 90 mmol/L — AB (ref 101–111)
CO2: 37 mmol/L — ABNORMAL HIGH (ref 22–32)
Calcium: 8.5 mg/dL — ABNORMAL LOW (ref 8.9–10.3)
Creatinine, Ser: 1.06 mg/dL — ABNORMAL HIGH (ref 0.44–1.00)
GFR calc non Af Amer: 48 mL/min — ABNORMAL LOW (ref >60)
GFR, EST AFRICAN AMERICAN: 56 mL/min — AB (ref >60)
Glucose, Bld: 97 mg/dL (ref 65–99)
POTASSIUM: 3.4 mmol/L — AB (ref 3.5–5.1)
SODIUM: 136 mmol/L (ref 135–145)

## 2015-01-25 LAB — MRSA PCR SCREENING: MRSA by PCR: NEGATIVE

## 2015-01-25 LAB — GLUCOSE, CAPILLARY
GLUCOSE-CAPILLARY: 73 mg/dL (ref 65–99)
GLUCOSE-CAPILLARY: 87 mg/dL (ref 65–99)
Glucose-Capillary: 90 mg/dL (ref 65–99)
Glucose-Capillary: 85 mg/dL (ref 65–99)

## 2015-01-25 MED ORDER — TORSEMIDE 20 MG PO TABS
20.0000 mg | ORAL_TABLET | Freq: Two times a day (BID) | ORAL | Status: DC
  Administered 2015-01-25 – 2015-01-27 (×5): 20 mg via ORAL
  Filled 2015-01-25 (×6): qty 1

## 2015-01-25 MED ORDER — TORSEMIDE 20 MG PO TABS
40.0000 mg | ORAL_TABLET | Freq: Two times a day (BID) | ORAL | Status: DC
  Filled 2015-01-25 (×2): qty 2

## 2015-01-25 NOTE — Progress Notes (Signed)
Pt sent to [place] for ABD [place] than transporter will take pt to 3 East room Tech took all belongings to the next room.

## 2015-01-25 NOTE — Progress Notes (Addendum)
[facility] PROGRESS NOTE  Melanie Camacho:[id number] DOB: 05-26-1932 DOA: 01/05/2015 PCP: PROVIDER NOT IN SYSTEM   Brief Narrative: Patient is a 81 year old female with hypertension, diabetes, diet controlled, DM, CAD, AVR, stage 3 CKD, afib on eliquis, presented on 4/28 for abdominal pain and shortness of breath. She was evaluated with a CT abdomen and pelvis , which did not reveal any acute intra abdominal findings. There was severe anasarca with moderate right and small left pleural effusions. Patient was admitted for acute on chronic diastolic heart failure and started on IV lasix. From 4/30 and 5/1, patient was hypotensive, tachycardic and her lasix and BB was held. Cardiology and heart failure team were consulted for recommendations.  Patient was noted to be lethargic on 5/3, CT head was negative for acute intracranial pathology, ABG did not show acute hypercapnia, PCO2 of 50, ammonia level was elevated at 91 and started on lactulose 5/5 patient continued to be somnolent, MRI of the brain was obtained which was negative for acute infarct.. Medications were adjusted, discontinued Neurontin and Requip. Lactulose was increased 5/6, 5,7: Patient's mental status continues to improve, still very deconditioned, anasarca with fluid overload  5/8: Developed dermatomal rash on the left breast and radiating to the left back, started on Valtrex 5/10 frank diarrhea in the rectal tube, hence lactulose was decreased on 5/9. The patient has completed treatment for shingles outbreak and continues to diurese per Cards    Assessment/Plan: Principal Problem:   Acute on chronic diastolic CHF with anasarca - 31L negative and weight down 26lbs - continue management per cardiology, IV lasix and Zaroxylyn - creatinine stable -ECHO with preserved EF, PASP on ECHO    Chronic a-fib/S/P AVR-(tissue) 2011 at [facility] - on bisoprolol and apixaban    Liver lesion noted on CT in 4/29 -will check [place]    Shingles - Patient has been treated appropriately with valacyclovir and has completed more than 7 days of therapy. -Dr.Vega d/w infectious disease and patient may be taken off of contact precautions once the wound scabs over.    HTN -now stable - Continue holding orders placed for diuretic and beta blocker given recent hypotension    Hypothyroid - stable continue synthroid    DM (diabetes mellitus) - continue carb modified diet - Seems to be well controlled on diet    CKD (chronic kidney disease), stage III -Serum creatinine stable  DVT proph: on apixaban  Code Status: full Family Communication: No family at bedside Disposition Plan: keep in SDU   Consultants:  Cardiology  Procedures:  None  Antibiotics:  None  HPI/Subjective: Breathing improving, no new complaints  Objective: Filed Vitals:   01/25/15 1200  BP: 89/44  Pulse: 98  Temp: 97.5 F (36.4 C)  Resp: 15    Intake/Output Summary (Last 24 hours) at 01/25/15 1323 Last data filed at 01/25/15 1200  Gross per 24 hour  Intake    800 ml  Output   3250 ml  Net  -2450 ml   Filed Weights   01/23/15 1647 01/24/15 0900 01/25/15 0500  Weight: 99.4 kg (219 lb 2.2 oz) 97.8 kg (215 lb 9.8 oz) 97.2 kg (214 lb 4.6 oz)    Exam:   General:  Pt in nad, alert and awake  Cardiovascular: Irregularly irregular, no rubs  Respiratory: cta bl, no wheezes  Abdomen: soft, NT, ND, no guarding, abd wall with 2plus edema  Musculoskeletal: no cyanosis or clubbing   EXT: 1plus edema  Data Reviewed: Basic Metabolic  Panel:  Recent Labs Lab 01/20/15 0505 01/21/15 0511 01/22/15 0535 01/23/15 1516 01/24/15 0325  NA 136 139 138 137 137  K 3.6 3.7 3.4* 4.4 4.9  CL 89* 90* 89* 92* 92*  CO2 38* 38* 39* 37* 35*  GLUCOSE 78 90 78 113* 85  BUN 27* 27* 28* 27* 25*  CREATININE 1.19* 1.16* 1.16* 1.17* 1.13*  CALCIUM 8.7* 8.8* 8.5* 8.3* 8.4*   Liver Function Tests: No results for input(s): AST, ALT, ALKPHOS,  BILITOT, PROT, ALBUMIN in the last 168 hours. No results for input(s): LIPASE, AMYLASE in the last 168 hours. No results for input(s): AMMONIA in the last 168 hours. CBC:  Recent Labs Lab 01/20/15 0505 01/23/15 1516  WBC 3.8* 4.5  HGB 9.7* 9.9*  HCT 31.5* 32.4*  MCV 101.6* 101.3*  PLT 101* 153   Cardiac Enzymes: No results for input(s): CKTOTAL, CKMB, CKMBINDEX, TROPONINI in the last 168 hours. BNP (last 3 results)  Recent Labs  12/14/14 1410 12/27/14 0449 01/05/15 1528  BNP 1222.9* 1099.7* 972.1*    ProBNP (last 3 results) No results for input(s): PROBNP in the last 8760 hours.  CBG:  Recent Labs Lab 01/24/15 1337 01/24/15 1544 01/24/15 2150 01/25/15 0730 01/25/15 1159  GLUCAP 82 84 104* 73 87    No results found for this or any previous visit (from the past 240 hour(s)).   Studies: No results found.  Scheduled Meds: . antiseptic oral rinse  7 mL Mouth Rinse BID  . apixaban  5 mg Oral BID  . bisoprolol  2.5 mg Oral Daily  . colesevelam  1,875 mg Oral BID WC  . ferrous fumarate  1 tablet Oral Daily  . furosemide  80 mg Intravenous BID  . gabapentin  100 mg Oral TID  . lactulose  10 g Oral TID  . levothyroxine  50 mcg Oral QAC breakfast  . metolazone  5 mg Oral Daily  . multivitamin  2 tablet Oral Daily  . pantoprazole  40 mg Oral BID  . potassium chloride  40 mEq Oral Daily  . sodium chloride  3 mL Intravenous Q12H   Continuous Infusions:    Time spent: > 35 minutes    Tony Granquist  [facility] Pager [phone] If 7PM-7AM, please contact night-coverage at [email or web address], password [facility] 01/25/2015, 1:23 PM  LOS: 20 days

## 2015-01-25 NOTE — Progress Notes (Signed)
Called report to Jacques Earthly. RN Pt to be transferred to 3 East bed 3 per recliner.

## 2015-01-25 NOTE — Progress Notes (Signed)
Pt to ABD ultrasound qwithn tele box on than pt will go to 3 East bed 03

## 2015-01-25 NOTE — Progress Notes (Signed)
Spoke with Infectious Disease Cherri Speigel RN -Pt has Shingles but none of areas are draining all are crusted over. Okay to d/c airborne isolation

## 2015-01-25 NOTE — Progress Notes (Signed)
Call to daughter Arline Asp to make aware of her moms new room.

## 2015-01-25 NOTE — Progress Notes (Addendum)
Primary cardiologist: Dr. Nicki Guadalajara  Seen for followup: Diastolic heart failure with RV dysfunction, atrial fibrillation  Subjective:    Shingles improved.  Remains on IV lasix. Metolazone added back yesterday. Echo EF 45% RVSP 32. Weight down to 214. SBP 89-98  Objective:   Temp:  [97 F (36.1 C)-98.4 F (36.9 C)] 97.5 F (36.4 C) (05/18 1200) Pulse Rate:  [70-100] 98 (05/18 1200) Resp:  [15-23] 15 (05/18 1200) BP: (89-98)/(40-66) 89/44 mmHg (05/18 1200) SpO2:  [99 %-100 %] 100 % (05/18 1200) Weight:  [97.2 kg (214 lb 4.6 oz)] 97.2 kg (214 lb 4.6 oz) (05/18 0500) Last BM Date: 01/25/15  Filed Weights   01/23/15 1647 01/24/15 0900 01/25/15 0500  Weight: 99.4 kg (219 lb 2.2 oz) 97.8 kg (215 lb 9.8 oz) 97.2 kg (214 lb 4.6 oz)    Intake/Output Summary (Last 24 hours) at 01/25/15 1344 Last data filed at 01/25/15 1200  Gross per 24 hour  Intake    800 ml  Output   3250 ml  Net  -2450 ml    Telemetry: Atrial fibrillation 90s.    Exam: General: Obese woman, sitting in chair.  Lungs: Decreased clear Cardiac: JVP 6-7 cm , irregularly irregular. 1/6 SEM  Back: Shingles rash on left scapula - healing Abdomen: Obese, soft, active bowel sounds.  Extremities: Lower legs wrapped, tr-1+ edema to knees bilaterally.   GU: Foley   Lab Results:  Basic Metabolic Panel:  Recent Labs Lab 01/22/15 0535 01/23/15 1516 01/24/15 0325  NA 138 137 137  K 3.4* 4.4 4.9  CL 89* 92* 92*  CO2 39* 37* 35*  GLUCOSE 78 113* 85  BUN 28* 27* 25*  CREATININE 1.16* 1.17* 1.13*  CALCIUM 8.5* 8.3* 8.4*    Liver Function Tests: No results for input(s): AST, ALT, ALKPHOS, BILITOT, PROT, ALBUMIN in the last 168 hours.  CBC:  Recent Labs Lab 01/20/15 0505 01/23/15 1516  WBC 3.8* 4.5  HGB 9.7* 9.9*  HCT 31.5* 32.4*  MCV 101.6* 101.3*  PLT 101* 153    Cardiac Enzymes: No results for input(s): CKTOTAL, CKMB, CKMBINDEX, TROPONINI in the last 168 hours.  Limited  echocardiogram 01/11/2015: Study Conclusions  - Left ventricle: The cavity size was normal. There was mild concentric hypertrophy. Systolic function was mildly reduced. The estimated ejection fraction was in the range of 45% to 50%. Possible hypokinesis of the entireinferolateral myocardium. - Aortic valve: The AV is not visualized adequately to comment on. There was mild regurgitation. - Mitral valve: Calcified annulus. Moderate thickening. There was mild regurgitation. - Right ventricle: RV was not measured by appears dilated. Systolic function was moderately to severely reduced. - Pulmonary arteries: PA peak pressure: 46 mm Hg (S). - Pericardium, extracardiac: There was a left pleural effusion.  Impressions:  - The right ventricular systolic pressure was increased consistent with moderate pulmonary hypertension.    Medications:   Scheduled Medications: . antiseptic oral rinse  7 mL Mouth Rinse BID  . apixaban  5 mg Oral BID  . bisoprolol  2.5 mg Oral Daily  . colesevelam  1,875 mg Oral BID WC  . ferrous fumarate  1 tablet Oral Daily  . furosemide  80 mg Intravenous BID  . gabapentin  100 mg Oral TID  . lactulose  10 g Oral TID  . levothyroxine  50 mcg Oral QAC breakfast  . metolazone  5 mg Oral Daily  . multivitamin  2 tablet Oral Daily  . pantoprazole  40 mg  Oral BID  . potassium chloride  40 mEq Oral Daily  . sodium chloride  3 mL Intravenous Q12H     PRN Medications: sodium chloride, acetaminophen, gi cocktail, ondansetron (ZOFRAN) IV, oxyCODONE, phenazopyridine, sodium chloride, sodium chloride, sodium chloride   Assessment:   1. Acute on chronic diastolic HF R>>L with anasarca. Urine output has increased substantially on IV Lasix and metolazone.   2. Acute on chronic RV failure with acute cor pulmonale. PASP 46 mmHg by echocardiogram.  3. AoV disease s/p bioprosthetic AVR.  4. Morbid obesity.  5. Chronic AF.  6. Hypoalbuminemia- Albumin  2.7.  7. PAF - Eliquis. Mild nose bleed. Resolved.   8. Acute shingles - per primary team  9. Frequent PVCs   Plan/Discussion:    VI think volume status is as good as we can get it. Renal function stable. BP soft. Echo with much improved PAP ~20mmHG. Will defer RHC. Change IV lasix to torsemide 20 bid and will watch volume status closely. Can use metolazone as needed.  Likely needs SNF.    Arvilla Meres MD 01/25/2015 1:44 PM

## 2015-01-26 ENCOUNTER — Inpatient Hospital Stay (HOSPITAL_COMMUNITY): Payer: Medicare Other

## 2015-01-26 DIAGNOSIS — E876 Hypokalemia: Secondary | ICD-10-CM | POA: Insufficient documentation

## 2015-01-26 LAB — BASIC METABOLIC PANEL
ANION GAP: 9 (ref 5–15)
BUN: 26 mg/dL — ABNORMAL HIGH (ref 6–20)
CALCIUM: 8.3 mg/dL — AB (ref 8.9–10.3)
CO2: 37 mmol/L — AB (ref 22–32)
CREATININE: 1.02 mg/dL — AB (ref 0.44–1.00)
Chloride: 91 mmol/L — ABNORMAL LOW (ref 101–111)
GFR, EST AFRICAN AMERICAN: 59 mL/min — AB (ref >60)
GFR, EST NON AFRICAN AMERICAN: 51 mL/min — AB (ref >60)
Glucose, Bld: 92 mg/dL (ref 65–99)
Potassium: 2.8 mmol/L — ABNORMAL LOW (ref 3.5–5.1)
Sodium: 137 mmol/L (ref 135–145)

## 2015-01-26 LAB — GLUCOSE, CAPILLARY
GLUCOSE-CAPILLARY: 107 mg/dL — AB (ref 65–99)
Glucose-Capillary: 84 mg/dL (ref 65–99)
Glucose-Capillary: 100 mg/dL — ABNORMAL HIGH (ref 65–99)
Glucose-Capillary: 93 mg/dL (ref 65–99)

## 2015-01-26 MED ORDER — BISACODYL 10 MG RE SUPP
10.0000 mg | Freq: Once | RECTAL | Status: AC
  Administered 2015-01-27: 10 mg via RECTAL
  Filled 2015-01-26: qty 1

## 2015-01-26 MED ORDER — POTASSIUM CHLORIDE CRYS ER 20 MEQ PO TBCR
40.0000 meq | EXTENDED_RELEASE_TABLET | Freq: Once | ORAL | Status: AC
  Administered 2015-01-26: 40 meq via ORAL
  Filled 2015-01-26: qty 2

## 2015-01-26 MED ORDER — POTASSIUM CHLORIDE CRYS ER 20 MEQ PO TBCR
60.0000 meq | EXTENDED_RELEASE_TABLET | ORAL | Status: AC
  Administered 2015-01-26 (×2): 60 meq via ORAL
  Filled 2015-01-26: qty 3

## 2015-01-26 NOTE — Progress Notes (Signed)
[facility] PROGRESS NOTE  Melanie Camacho ZOX:[id number] DOB: 03-03-1932 DOA: 01/05/2015 PCP: PROVIDER NOT IN SYSTEM   Brief Narrative: Patient is a 81 year old female with hypertension, diabetes, diet controlled, DM, CAD, AVR, stage 3 CKD, afib on eliquis, presented on 4/28 for abdominal pain and shortness of breath. She was evaluated with a CT abdomen and pelvis , which did not reveal any acute intra abdominal findings. There was severe anasarca with moderate right and small left pleural effusions. Patient was admitted for acute on chronic diastolic heart failure and started on IV lasix. From 4/30 and 5/1, patient was hypotensive, tachycardic and her lasix and BB was held. Cardiology and heart failure team were consulted for recommendations.  Patient was noted to be lethargic on 5/3, CT head was negative for acute intracranial pathology, ABG did not show acute hypercapnia, PCO2 of 50, ammonia level was elevated at 91 and started on lactulose 5/5 patient continued to be somnolent, MRI of the brain was obtained which was negative for acute infarct.. Medications were adjusted, discontinued Neurontin and Requip. Lactulose was increased 5/6, 5,7: Patient's mental status continues to improve, still very deconditioned, anasarca with fluid overload  5/8: Developed dermatomal rash on the left breast and radiating to the left back, started on Valtrex 5/10 frank diarrhea in the rectal tube, hence lactulose was decreased on 5/9. The patient has completed treatment for shingles outbreak and continues to diurese per Cards    Assessment/Plan: Principal Problem:   Acute on chronic diastolic CHF with anasarca - 33L negative and weight down 28lbs - continue management per cardiology, changed to PO torsemide 5/18, still with 2plus abd wall, thigh and posterior chest wall edema  - creatinine stable -ECHO with preserved EF, PASP  -replace K    Chronic a-fib/S/P AVR-(tissue) 2011 at [facility] - on  bisoprolol and apixaban    Liver lesion noted on CT in 4/29 -[place] pending    Shingles - Patient has been treated appropriately with valacyclovir and has completed more than 7 days of therapy. -Dr.Vega d/w infectious disease and patient may be taken off of contact precautions once the wound scabs over.    HTN -now stable - Continue holding orders placed for diuretic and beta blocker given recent hypotension    Hypothyroid - stable continue synthroid    DM (diabetes mellitus) - continue carb modified diet - Seems to be well controlled on diet    CKD (chronic kidney disease), stage III -Serum creatinine stable  DVT proph: on apixaban  Code Status: full Family Communication: No family at bedside Disposition Plan: SNF when stable   Consultants:  Cardiology  Procedures:  None  Antibiotics:  None  HPI/Subjective: Wondering abt [place], breathing better  Objective: Filed Vitals:   01/26/15 0632  BP: 107/47  Pulse: 78  Temp: 97.3 F (36.3 C)  Resp: 17    Intake/Output Summary (Last 24 hours) at 01/26/15 0744 Last data filed at 01/26/15 0655  Gross per 24 hour  Intake   1203 ml  Output   3025 ml  Net  -1822 ml   Filed Weights   01/25/15 0500 01/25/15 1921 01/26/15 8119  Weight: 97.2 kg (214 lb 4.6 oz) 96.435 kg (212 lb 9.6 oz) 95.346 kg (210 lb 3.2 oz)    Exam:   General:  Pt in nad, alert and awake  Cardiovascular: Irregularly irregular, no rubs  Respiratory: cta bl, no wheezes  Abdomen: soft, NT, ND, no guarding, abd wall with 2plus edema  Musculoskeletal: no  cyanosis or clubbing   EXT: 2 plus thigh edema  Data Reviewed: Basic Metabolic Panel:  Recent Labs Lab 01/22/15 0535 01/23/15 1516 01/24/15 0325 01/25/15 1445 Feb 07, 2015 0415  NA 138 137 137 136 137  K 3.4* 4.4 4.9 3.4* 2.8*  CL 89* 92* 92* 90* 91*  CO2 39* 37* 35* 37* 37*  GLUCOSE 78 113* 85 97 92  BUN 28* 27* 25* 26* 26*  CREATININE 1.16* 1.17* 1.13* 1.06* 1.02*  CALCIUM 8.5*  8.3* 8.4* 8.5* 8.3*   Liver Function Tests: No results for input(s): AST, ALT, ALKPHOS, BILITOT, PROT, ALBUMIN in the last 168 hours. No results for input(s): LIPASE, AMYLASE in the last 168 hours. No results for input(s): AMMONIA in the last 168 hours. CBC:  Recent Labs Lab 01/20/15 0505 01/23/15 1516  WBC 3.8* 4.5  HGB 9.7* 9.9*  HCT 31.5* 32.4*  MCV 101.6* 101.3*  PLT 101* 153   Cardiac Enzymes: No results for input(s): CKTOTAL, CKMB, CKMBINDEX, TROPONINI in the last 168 hours. BNP (last 3 results)  Recent Labs  12/14/14 1410 12/27/14 0449 01/05/15 1528  BNP 1222.9* 1099.7* 972.1*    ProBNP (last 3 results) No results for input(s): PROBNP in the last 8760 hours.  CBG:  Recent Labs Lab 01/24/15 2150 01/25/15 0730 01/25/15 1159 01/25/15 1728 01/25/15 2215  GLUCAP 104* 73 87 90 85    Recent Results (from the past 240 hour(s))  MRSA PCR Screening     Status: None   Collection Time: 01/25/15  5:41 PM  Result Value Ref Range Status   MRSA by PCR NEGATIVE NEGATIVE Final    Comment:        The GeneXpert MRSA Assay (FDA approved for NASAL specimens only), is one component of a comprehensive MRSA colonization surveillance program. It is not intended to diagnose MRSA infection nor to guide or monitor treatment for MRSA infections.      Studies: US Abdomen Limited Ruq  02/07/2015   CLINICAL DATA:  81 year old female with a history of prior lesion on CT study 01/06/2015  EXAM: US ABDOMEN LIMITED - RIGHT UPPER QUADRANT  COMPARISON:  CT 01/06/2015  FINDINGS: Gallbladder:  Surgical changes of cholecystectomy  Common bile duct:  Diameter: 7.3 mm  Liver:  Relatively heterogeneous echotexture of the liver. Anechoic structure with through transmission and no internal complexity within the right liver lobe measures 2.8 cm x 2.5 cm x 3.1 cm. No internal flow.  IMPRESSION: Right liver lobe cyst.  Evidence of liver steatosis.  Signed,  Yvone Neu. Loreta Ave, DO  [facility]  [facility]   Electronically Signed   By: Gilmer Mor D.O.   On: 2015-02-07 07:39    Scheduled Meds: . antiseptic oral rinse  7 mL Mouth Rinse BID  . apixaban  5 mg Oral BID  . bisoprolol  2.5 mg Oral Daily  . colesevelam  1,875 mg Oral BID WC  . ferrous fumarate  1 tablet Oral Daily  . gabapentin  100 mg Oral TID  . lactulose  10 g Oral TID  . levothyroxine  50 mcg Oral QAC breakfast  . multivitamin  2 tablet Oral Daily  . pantoprazole  40 mg Oral BID  . potassium chloride  40 mEq Oral Daily  . sodium chloride  3 mL Intravenous Q12H  . torsemide  20 mg Oral BID   Continuous Infusions:    Time spent: > 35 minutes    Melanie Camacho  [facility] Pager [phone] If 7PM-7AM,  please contact night-coverage at [email or web address], password [facility] 01/26/2015, 7:44 AM  LOS: 21 days

## 2015-01-26 NOTE — Clinical Social Work Note (Signed)
Patient will discharge to [facility] SNF once medically stable.  CSW provided projected discharge update to [facility].    Vickii Penna, LCSW [phone]  Psychiatric & Orthopedics (5N 1-8) Clinical Social Worker

## 2015-01-26 NOTE — Progress Notes (Signed)
Primary cardiologist: Dr. Nicki Guadalajara  Seen for followup: Diastolic heart failure with RV dysfunction, atrial fibrillation  Subjective:    Shingles improved.  Yesterday IV lasix stopped and started on torsemide.  Objective:   Temp:  [97.2 F (36.2 C)-97.8 F (36.6 C)] 97.3 F (36.3 C) (05/19 1610) Pulse Rate:  [62-83] 78 (05/19 0632) Resp:  [15-18] 17 (05/19 0632) BP: (99-107)/(47-69) 107/47 mmHg (05/19 0632) SpO2:  [99 %-100 %] 99 % (05/19 9604) Weight:  [210 lb 3.2 oz (95.346 kg)-212 lb 9.6 oz (96.435 kg)] 210 lb 3.2 oz (95.346 kg) (05/19 0632) Last BM Date: 01/25/15  Filed Weights   01/25/15 0500 01/25/15 1921 01/26/15 5409  Weight: 214 lb 4.6 oz (97.2 kg) 212 lb 9.6 oz (96.435 kg) 210 lb 3.2 oz (95.346 kg)    Intake/Output Summary (Last 24 hours) at 01/26/15 1214 Last data filed at 01/26/15 0655  Gross per 24 hour  Intake    723 ml  Output   2100 ml  Net  -1377 ml    Telemetry: Atrial fibrillation 90s.    Exam: General: Obese woman, sitting in chair.  Lungs: Decreased clear Cardiac: JVP 6-7 cm , irregularly irregular. 1/6 SEM  Back: Shingles rash on left scapula - healing Abdomen: Obese, soft, active bowel sounds.  Extremities: Lower legs wrapped, tr-1+ edema to knees bilaterally.   GU: Foley   Lab Results:  Basic Metabolic Panel:  Recent Labs Lab 01/24/15 0325 01/25/15 1445 01/26/15 0415  NA 137 136 137  K 4.9 3.4* 2.8*  CL 92* 90* 91*  CO2 35* 37* 37*  GLUCOSE 85 97 92  BUN 25* 26* 26*  CREATININE 1.13* 1.06* 1.02*  CALCIUM 8.4* 8.5* 8.3*    Liver Function Tests: No results for input(s): AST, ALT, ALKPHOS, BILITOT, PROT, ALBUMIN in the last 168 hours.  CBC:  Recent Labs Lab 01/20/15 0505 01/23/15 1516  WBC 3.8* 4.5  HGB 9.7* 9.9*  HCT 31.5* 32.4*  MCV 101.6* 101.3*  PLT 101* 153    Cardiac Enzymes: No results for input(s): CKTOTAL, CKMB, CKMBINDEX, TROPONINI in the last 168 hours.  Limited echocardiogram 01/11/2015: Study  Conclusions  - Left ventricle: The cavity size was normal. There was mild concentric hypertrophy. Systolic function was mildly reduced. The estimated ejection fraction was in the range of 45% to 50%. Possible hypokinesis of the entireinferolateral myocardium. - Aortic valve: The AV is not visualized adequately to comment on. There was mild regurgitation. - Mitral valve: Calcified annulus. Moderate thickening. There was mild regurgitation. - Right ventricle: RV was not measured by appears dilated. Systolic function was moderately to severely reduced. - Pulmonary arteries: PA peak pressure: 46 mm Hg (S). - Pericardium, extracardiac: There was a left pleural effusion.  Impressions:  - The right ventricular systolic pressure was increased consistent with moderate pulmonary hypertension.    Medications:   Scheduled Medications: . antiseptic oral rinse  7 mL Mouth Rinse BID  . apixaban  5 mg Oral BID  . bisoprolol  2.5 mg Oral Daily  . colesevelam  1,875 mg Oral BID WC  . ferrous fumarate  1 tablet Oral Daily  . gabapentin  100 mg Oral TID  . lactulose  10 g Oral TID  . levothyroxine  50 mcg Oral QAC breakfast  . multivitamin  2 tablet Oral Daily  . pantoprazole  40 mg Oral BID  . sodium chloride  3 mL Intravenous Q12H  . torsemide  20 mg Oral BID  PRN Medications: sodium chloride, acetaminophen, gi cocktail, ondansetron (ZOFRAN) IV, oxyCODONE, phenazopyridine, sodium chloride, sodium chloride, sodium chloride   Assessment:   1. Acute on chronic diastolic HF R>>L with anasarca. Urine output has increased substantially on IV Lasix and metolazone.   2. Acute on chronic RV failure with acute cor pulmonale. PASP 46 mmHg by echocardiogram.  3. AoV disease s/p bioprosthetic AVR.  4. Morbid obesity.  5. Chronic AF.  6. Hypoalbuminemia- Albumin 2.7.  7. PAF - Eliquis. Mild nose bleed. Resolved.   8. Acute shingles - per primary team  9. Frequent  PVCs  10. Hypokalemia    Plan/Discussion:    Diuresing well with torsemide. Continue torsemide twice a day. Supplement K.   To SNF soon.       CLEGG,AMY NP-C  01/26/2015 12:14 PM   Patient seen and examined with Tonye Becket, NP. We discussed all aspects of the encounter. I agree with the assessment and plan as stated above.   Volume status much improved and continues to improve on torsemide. Pulmonary pressures down on echo. Would continue current regimen. Supp K+. Ready to go to SNF from our standpoint.   Bensimhon, Daniel,MD 12:27 PM

## 2015-01-26 NOTE — Progress Notes (Signed)
Pt with 3 open sores on back that look like shingles.  MD made aware.  Pt placed on contact precautions and is being moved to floors airborne room.  Will continue to monitor.

## 2015-01-26 NOTE — Progress Notes (Signed)
Notified on-call hospitalist of patient having an open area with some drainage noted and also saw dried up drainage on the bed pad on bed. Had concern about it maybe being shingles and needed clarification. Patient is having no pain associated it with it. Will leave sticky note for doctor to look at when making rounds.

## 2015-01-26 NOTE — Care Management Note (Signed)
Case Management Note  Patient Details  Name: Melanie Camacho MRN: [id number] Date of Birth: Oct 12, 1931  Subjective/Objective:          Admitted with CHF          Action/Plan:   Transition to SNF when medically stable; Soc worker following case  Expected Discharge Date:      01/30/2015            Expected Discharge Plan:  Skilled Nursing Facility  In-House Referral:  Clinical Social Work  Discharge planning Services  CM Consult    Status of Service:  In process, will continue to follow  Medicare Important Message Given:  Yes Date Medicare IM Given:  01/18/15 Medicare IM give by:  Verdis Prime RN Date Additional Medicare IM Given:   01/26/2015 Additional Medicare Important Message give by:   Abelino Derrick RN  If discussed at Long Length of Stay Meetings, dates discussed:  01/26/2015  Additional Comments:  Reola Mosher [phone] 01/26/2015, 11:12 AM

## 2015-01-26 NOTE — Progress Notes (Signed)
UR COMPLETED  

## 2015-01-27 ENCOUNTER — Inpatient Hospital Stay
Admission: RE | Admit: 2015-01-27 | Discharge: 2015-02-04 | Disposition: A | Discharge status: Nursing Home (Includes ICF) | Payer: Medicaid Other | Source: Ambulatory Visit | Attending: Internal Medicine | Admitting: Internal Medicine

## 2015-01-27 DIAGNOSIS — R1084 Generalized abdominal pain: Secondary | ICD-10-CM

## 2015-01-27 LAB — BASIC METABOLIC PANEL
ANION GAP: 10 (ref 5–15)
ANION GAP: 8 (ref 5–15)
BUN: 28 mg/dL — ABNORMAL HIGH (ref 6–20)
BUN: 26 mg/dL — ABNORMAL HIGH (ref 6–20)
CHLORIDE: 90 mmol/L — AB (ref 101–111)
CHLORIDE: 91 mmol/L — AB (ref 101–111)
CO2: 33 mmol/L — ABNORMAL HIGH (ref 22–32)
CO2: 37 mmol/L — AB (ref 22–32)
CREATININE: 1.16 mg/dL — AB (ref 0.44–1.00)
Calcium: 8.4 mg/dL — ABNORMAL LOW (ref 8.9–10.3)
Calcium: 8.5 mg/dL — ABNORMAL LOW (ref 8.9–10.3)
Creatinine, Ser: 1.21 mg/dL — ABNORMAL HIGH (ref 0.44–1.00)
GFR calc Af Amer: 48 mL/min — ABNORMAL LOW (ref >60)
GFR calc Af Amer: 50 mL/min — ABNORMAL LOW (ref >60)
GFR calc non Af Amer: 41 mL/min — ABNORMAL LOW (ref >60)
GFR calc non Af Amer: 43 mL/min — ABNORMAL LOW (ref >60)
GLUCOSE: 109 mg/dL — AB (ref 65–99)
Glucose, Bld: 114 mg/dL — ABNORMAL HIGH (ref 65–99)
POTASSIUM: 6.2 mmol/L — AB (ref 3.5–5.1)
Potassium: 3.7 mmol/L (ref 3.5–5.1)
SODIUM: 136 mmol/L (ref 135–145)
Sodium: 133 mmol/L — ABNORMAL LOW (ref 135–145)

## 2015-01-27 LAB — GLUCOSE, CAPILLARY
Glucose-Capillary: 92 mg/dL (ref 65–99)
Glucose-Capillary: 134 mg/dL — ABNORMAL HIGH (ref 65–99)

## 2015-01-27 MED ORDER — HYDROCORTISONE 2.5 % RE CREA
TOPICAL_CREAM | Freq: Three times a day (TID) | RECTAL | Status: AC
Start: 2015-01-27 — End: ?

## 2015-01-27 MED ORDER — POTASSIUM CHLORIDE CRYS ER 20 MEQ PO TBCR
40.0000 meq | EXTENDED_RELEASE_TABLET | Freq: Every day | ORAL | Status: DC
  Administered 2015-01-27: 40 meq via ORAL
  Filled 2015-01-27: qty 2

## 2015-01-27 MED ORDER — TORSEMIDE 20 MG PO TABS: 20.0000 mg | ORAL_TABLET | Freq: Two times a day (BID) | ORAL | Status: AC

## 2015-01-27 MED ORDER — GABAPENTIN 100 MG PO CAPS: 100.0000 mg | ORAL_CAPSULE | Freq: Three times a day (TID) | ORAL | Status: AC

## 2015-01-27 MED ORDER — ROPINIROLE HCL 0.25 MG PO TABS
0.2500 mg | ORAL_TABLET | Freq: Every day | ORAL | Status: DC
  Administered 2015-01-27: 0.25 mg via ORAL
  Filled 2015-01-27 (×2): qty 1

## 2015-01-27 MED ORDER — HYDROCORTISONE 2.5 % RE CREA
TOPICAL_CREAM | Freq: Three times a day (TID) | RECTAL | Status: DC
  Administered 2015-01-27: 1 via RECTAL
  Administered 2015-01-27: 10:00:00 via RECTAL
  Filled 2015-01-27: qty 28.35

## 2015-01-27 MED ORDER — ROPINIROLE HCL 0.25 MG PO TABS: 0.2500 mg | ORAL_TABLET | Freq: Every day | ORAL | Status: DC

## 2015-01-27 MED ORDER — LACTULOSE 10 GM/15ML PO SOLN: 10.0000 g | Freq: Two times a day (BID) | ORAL | Status: AC

## 2015-01-27 NOTE — Progress Notes (Signed)
Orthopedic Tech Progress Note Patient Details:  Melanie Camacho 10/06/1931 [id number]  Ortho Devices Type of Ortho Device: Roland Rack boot Ortho Device/Splint Location: bilateral Ortho Device/Splint Interventions: Application   Nikki Dom 01/27/2015, 1:33 PM

## 2015-01-27 NOTE — Progress Notes (Signed)
Patient discharged to [facility], transported by daughter.  Discharge packet for SNF given to daughter.  IV removed prior to discharge; IV site clean, dry, and intact.

## 2015-01-27 NOTE — Discharge Summary (Addendum)
Physician Discharge Summary  Melanie Camacho:[id number] DOB: 13-Aug-1932 DOA: 01/05/2015  PCP: PROVIDER NOT IN SYSTEM  Admit date: 01/05/2015 Discharge date: 01/27/2015  Time spent: 45 minutes  Recommendations for Outpatient Follow-up:  1. Dr. Gala Romney in 1-2weeks 2. Bmet in 1 week 3. Please monitor areas of secondary infection/inflammation around the healed and crusted shingles lesions, may need to start Abx if worsens, doesn't improve  Discharge Diagnoses:  Principal Problem:   Acute on chronic diastolic CHF (congestive heart failure), NYHA class 1 Active Problems:   Chronic a-fib   S/P AVR-(tissue) 2011 at [facility] - no CABG with this   HTN- (transient hypotension during recent admission)   Hypothyroid   DM (diabetes mellitus)   CKD (chronic kidney disease), stage III   Abdominal pain   CHF exacerbation   RVF (right ventricular failure)   Anasarca   PVC's (premature ventricular contractions)   Hypokalemia   Discharge Condition: stable  Diet recommendation: Low Sodium, heart healthy  Filed Weights   01/25/15 0500 01/25/15 1921 01/26/15 8119  Weight: 97.2 kg (214 lb 4.6 oz) 96.435 kg (212 lb 9.6 oz) 95.346 kg (210 lb 3.2 oz)    History of present illness:  Chief Complaint: Shortness of breath and abdominal pain HPI: Melanie Camacho is a 81 y.o. female with past medical history of hypertension, hyperlipidemia, diet-controlled diabetes, GERD, hypothyroidism, diastolic congestive heart failure (EF 55-60%), CAD, post status of AVR 2011 at [facility], chronic kidney disease-III, A. fib on Eliquis, history of positive TB, anemia, chronic back pain, arthritis, who presented with worsening shortness of breath and abdominal pain. Patient reported that she was recently hospitalized from 4/6 to 4/22 because of CHF exacerbation. She was treated with aggressive diuretics in hospital. Her body weight decreased by 22 pounds at discharge. Patient was discharged to SNF for rehabilitation. She reported  that she was taking torsemide 20 mg daily, but her body weight weight  increased by 8 pounds. Her SOB and leg edema had been progressively worsening  Hospital Course:  Acute on chronic diastolic CHF with anasarca - was followed by CHF service and aggressively diuresed - 33L negative and weight down 28lbs - changed to PO torsemide on 5/18, still with some abd wall, thigh and posterior chest wall edema but BP soft and wouldn't tolerate very much more of aggressive diuresis due to soft BPs - creatinine stable -ECHO with preserved EF, PASP    Chronic a-fib/S/P AVR-(tissue) 2011 at [facility] - on bisoprolol and apixaban   Liver lesion noted on CT in 4/29 -[place] characterized this as a cyst   Shingles -she had a shingles outbreak this admission, left upper back - Patient has been treated appropriately with valacyclovir and has completed more than 7 days of therapy. - Dr.Vega d/w infectious disease and patient may be taken off of contact precautions once the wound scabs over. - she has some erythema around the crusted lesions which is secondary infection and this needs to be watched, may need to start antibiotics if progresses. -She doesn't need contact isolation any more since the shingles lesions have crusted over, i called and discussed this with Dr.Hatcher from Infectious disease who agreed   HTN -soft but stable -diuretic was held temporarily then resumed, now stable, bisoprolol with parameters( hold if BP<95 and HR<55) and Hold Torsemide if BP <100   Hypothyroid - stable continue synthroid   DM (diabetes mellitus) - continue carb modified diet - Seems to be well controlled on diet   CKD (  chronic kidney disease), stage III -Serum creatinine stable  Consultations:  Cardiology/CHF service  Discharge Exam: Filed Vitals:   01/27/15 0524  BP: 93/53  Pulse: 91  Temp: 97.6 F (36.4 C)  Resp:     General: AAOx3 Cardiovascular: S1S2/RRR Respiratory: CTAB  Discharge  Instructions   Discharge Instructions    Diet - low sodium heart healthy    Complete by:  As directed      Increase activity slowly    Complete by:  As directed           Current Discharge Medication List    START taking these medications   Details  hydrocortisone (ANUSOL-HC) 2.5 % rectal cream Place rectally 3 (three) times daily. Qty: 30 g, Refills: 0    lactulose (CHRONULAC) 10 GM/15ML solution Take 15 mLs (10 g total) by mouth 2 (two) times daily. Refills: 0      CONTINUE these medications which have CHANGED   Details  gabapentin (NEURONTIN) 100 MG capsule Take 1 capsule (100 mg total) by mouth 3 (three) times daily.    rOPINIRole (REQUIP) 0.25 MG tablet Take 1 tablet (0.25 mg total) by mouth at bedtime.    torsemide (DEMADEX) 20 MG tablet Take 1 tablet (20 mg total) by mouth 2 (two) times daily.      CONTINUE these medications which have NOT CHANGED   Details  apixaban (ELIQUIS) 2.5 MG TABS tablet Take 1 tablet (2.5 mg total) by mouth 2 (two) times daily.    bisoprolol (ZEBETA) 5 MG tablet Take 1 tablet (5 mg total) by mouth daily. Qty: 30 tablet, Refills: 11    Cholecalciferol (VITAMIN D3) 5000 UNITS CAPS Take 5,000 Units by mouth daily.    colesevelam (WELCHOL) 625 MG tablet Take 1,875 mg by mouth 2 (two) times daily with a meal.    docusate sodium (COLACE) 100 MG capsule Take 1 capsule (100 mg total) by mouth 2 (two) times daily. Qty: 10 capsule, Refills: 0    ferrous fumarate (HEMOCYTE - 106 MG FE) 325 (106 FE) MG TABS tablet Take 1 tablet by mouth daily.    levothyroxine (SYNTHROID, LEVOTHROID) 25 MCG tablet Take 1 tablet (25 mcg total) by mouth daily before breakfast.    Multiple Vitamins-Minerals (ICAPS) CAPS Take 2 capsules by mouth daily.     oxycodone (OXY-IR) 5 MG capsule Take 1 capsule (5 mg total) by mouth every 6 (six) hours as needed for pain. Qty: 10 capsule, Refills: 0    pantoprazole (PROTONIX) 40 MG tablet Take 1 tablet (40 mg total) by  mouth at bedtime.    potassium chloride SA (K-DUR,KLOR-CON) 20 MEQ tablet Take 2 tablets (40 mEq total) by mouth daily. Qty: 30 tablet, Refills: 0       Allergies  Allergen Reactions  . Morphine And Related     Stevens-Johnsons  . Tape   . Bacitracin Rash  . Betadine [Povidone Iodine] Rash  . Clindamycin/Lincomycin Rash  . Vancomycin Rash      The results of significant diagnostics from this hospitalization (including imaging, microbiology, ancillary and laboratory) are listed below for reference.    Significant Diagnostic Studies: Ct Abdomen Pelvis Wo Contrast  01/06/2015   CLINICAL DATA:  Intermittent right lower quadrant pain for several days.  EXAM: CT ABDOMEN AND PELVIS WITHOUT CONTRAST  TECHNIQUE: Multidetector CT imaging of the abdomen and pelvis was performed following the standard protocol without IV contrast.  COMPARISON:  None.  FINDINGS: BODY WALL: Severe anasarca. The abdominal wall is incompletely  imaged but there is no gross fluid collection. Lax abdominal wall with wide necked herniation below the umbilicus, containing nonobstructed small bowel. Muscular or fascia calcification in the abdominal wall which could be dystrophic or from previous surgery.  LOWER CHEST: Moderate right and small left pleural effusions with multi segment atelectasis.  Aortic valve replacement. There is mild cardiomegaly and extensive coronary atherosclerosis.  ABDOMEN/PELVIS:  Liver: Large liver, with the right lobe projecting to the iliac crest. Rounded masses in the right liver are near water density, excluding a 2 cm lesion in segment 6 which was likely not visualized/evaluated on sonography from 8 days ago.  Biliary: Cholecystectomy. There is suggestion of a choledochoenterostomy, although there is no pneumobilia.  Pancreas: Unremarkable.  Spleen: Unremarkable.  Adrenals: Unremarkable.  Kidneys and ureters: No hydronephrosis or stone.  Bladder: Decompressed by a Foley catheter.  Reproductive:  Unremarkable for age.  Bowel: Distal gastrectomy. The anastomoses are patent and there is no surrounding inflammatory change. No pericecal inflammation. No bowel obstruction.  Retroperitoneum: No mass or adenopathy.  Peritoneum: Small perihepatic ascites, likely from volume overload.  Vascular: No acute abnormality.  Diffuse arterial calcification.  OSSEOUS: No acute abnormalities.  IMPRESSION: 1. No acute intra-abdominal findings. 2. Severe anasarca. Moderate right and small left pleural effusions. 3. A 2 cm lesion in segment 6 is indeterminate due to density. Other liver lesions appear cystic. 4. Ventral abdominal hernia containing nonobstructed small bowel. 5. Distal gastrectomy.   Electronically Signed   By: Marnee Spring M.D.   On: 01/06/2015 10:36   Dg Chest 2 View  01/05/2015   CLINICAL DATA:  Congestive heart failure shortness breath.  EXAM: CHEST  2 VIEW  COMPARISON:  Radiograph 12/27/2014  FINDINGS: Cardiac silhouette is enlarged. There is bilateral pleural effusions similar prior. There is mild central venous pulmonary congestion. Mild basilar atelectasis.  IMPRESSION: Bilateral pleural effusions and central venous pulmonary congestion. Findings similar to comparison exam.   Electronically Signed   By: Genevive Bi M.D.   On: 01/05/2015 17:11   Ct Head Wo Contrast  01/10/2015   CLINICAL DATA:  Drowsiness  EXAM: CT HEAD WITHOUT CONTRAST  TECHNIQUE: Contiguous axial images were obtained from the base of the skull through the vertex without intravenous contrast.  COMPARISON:  None.  FINDINGS: Bony calvarium is intact. Mild atrophic changes are seen. No findings to suggest acute hemorrhage, acute infarction or space-occupying mass lesion are identified.  IMPRESSION: Mild atrophic changes without acute abnormality.   Electronically Signed   By: Alcide Clever M.D.   On: 01/10/2015 12:35   Mr Brain Wo Contrast  01/12/2015   CLINICAL DATA:  Acute encephalopathy  EXAM: MRI HEAD WITHOUT CONTRAST   TECHNIQUE: Multiplanar, multiecho pulse sequences of the brain and surrounding structures were obtained without intravenous contrast.  COMPARISON:  CT head 01/10/2015  FINDINGS: Image quality degraded by mild to moderate motion.  Generalized atrophy. Negative for hydrocephalus. Pituitary normal in size  Negative for acute infarct  Chronic microvascular ischemic changes in the white matter are mild. No cortical infarct. No brainstem lesion. Cerebellum intact  Negative for intracranial hemorrhage  12 mm calcified mass over the convexity on the left compatible with meningioma. No edema in the adjacent brain.  IMPRESSION: Atrophy and mild chronic microvascular ischemia.  No acute infarct  12 mm calcified meningioma over the convexity on the left.   Electronically Signed   By: Marlan Palau M.D.   On: 01/12/2015 10:02   US Renal  12/29/2014  CLINICAL DATA:  Acute renal failure  EXAM: RENAL/URINARY TRACT ULTRASOUND COMPLETE  COMPARISON:  None.  FINDINGS: Right Kidney:  Length: 9.1 cm. Diffuse cortical thinning is noted. No focal mass lesion or hydronephrosis is noted.  Left Kidney:  Length: 10.2 cm. Echogenicity within normal limits. No mass or hydronephrosis visualized.  Bladder:  Appears normal for degree of bladder distention.  A 3.2 cm hepatic cyst is noted. Small bilateral pleural effusions are noted.  IMPRESSION: Mild cortical thinning in the right kidney.  No mass lesion or hydronephrosis is noted.  Small bilateral pleural effusions.  Hepatic cyst.   Electronically Signed   By: Alcide Clever M.D.   On: 12/29/2014 11:28   Ir Fluoro Guide Cv Line Right  01/10/2015   CLINICAL DATA:  Multiple comorbidities including atrial fibrillation, hypertension, CHF, morbid obesity, poor peripheral access  EXAM: POWER PICC LINE PLACEMENT WITH ULTRASOUND AND FLUOROSCOPIC GUIDANCE  FLUOROSCOPY TIME:  30 seconds, 9 mGy  PROCEDURE: The patient was advised of the possible risks andcomplications and agreed to undergo the  procedure. The patient was then brought to the angiographic suite for the procedure.  The right arm was prepped with chlorhexidine, drapedin the usual sterile fashion using maximum barrier technique (cap and mask, sterile gown, sterile gloves, large sterile sheet, hand hygiene and cutaneous antisepsis) and infiltrated locally with 1% Lidocaine.  Ultrasound demonstrated patency of the right brachial vein, and this was documented with an image. Under real-time ultrasound guidance, this vein was accessed with a 21 gauge micropuncture needle and image documentation was performed. A 0.018 wire was introduced in to the vein. Over this, a 5 [place] double lumen power PICC was advanced to the lower SVC/right atrial junction. Fluoroscopy during the procedure and fluoro spot radiograph confirms appropriate catheter position. The catheter was flushed and covered with asterile dressing.  Catheter length: 38  Complications: None  IMPRESSION: Successful right arm power PICC line placement with ultrasound and fluoroscopic guidance. The catheter is ready for use.   Electronically Signed   By: Judie Petit.  Shick M.D.   On: 01/10/2015 12:56   Ir US Guide Vasc Access Right  01/10/2015   CLINICAL DATA:  Multiple comorbidities including atrial fibrillation, hypertension, CHF, morbid obesity, poor peripheral access  EXAM: POWER PICC LINE PLACEMENT WITH ULTRASOUND AND FLUOROSCOPIC GUIDANCE  FLUOROSCOPY TIME:  30 seconds, 9 mGy  PROCEDURE: The patient was advised of the possible risks andcomplications and agreed to undergo the procedure. The patient was then brought to the angiographic suite for the procedure.  The right arm was prepped with chlorhexidine, drapedin the usual sterile fashion using maximum barrier technique (cap and mask, sterile gown, sterile gloves, large sterile sheet, hand hygiene and cutaneous antisepsis) and infiltrated locally with 1% Lidocaine.  Ultrasound demonstrated patency of the right brachial vein, and this was  documented with an image. Under real-time ultrasound guidance, this vein was accessed with a 21 gauge micropuncture needle and image documentation was performed. A 0.018 wire was introduced in to the vein. Over this, a 5 [place] double lumen power PICC was advanced to the lower SVC/right atrial junction. Fluoroscopy during the procedure and fluoro spot radiograph confirms appropriate catheter position. The catheter was flushed and covered with asterile dressing.  Catheter length: 38  Complications: None  IMPRESSION: Successful right arm power PICC line placement with ultrasound and fluoroscopic guidance. The catheter is ready for use.   Electronically Signed   By: Judie Petit.  Shick M.D.   On: 01/10/2015 12:56   Dg Chest  Port 1 View  01/17/2015   CLINICAL DATA:  Status post central line placement.  EXAM: PORTABLE CHEST - 1 VIEW  COMPARISON:  January 07, 2015.  FINDINGS: Stable cardiomegaly. Stable mild central pulmonary vascular congestion is noted. Interval placement of right-sided PICC line with distal tip overlying expected position of right brachycephalic vein. No pneumothorax is noted. Bilateral pleural effusions are noted. Right midlung opacity is noted consistent with subsegmental atelectasis or possibly pneumonia.  IMPRESSION: Interval placement of right-sided PICC line with distal tip overlying expected position of right brachycephalic vein. Bilateral pleural effusions are again noted with right midlung opacity concerning for subsegmental atelectasis or possibly pneumonia.   Electronically Signed   By: Lupita Raider, M.D.   On: 01/17/2015 12:25   Dg Chest Port 1 View  01/07/2015   CLINICAL DATA:  Respiratory difficulty. Cough. Congestion. Cardiomegaly and CHF. No fever. No chest pain.  EXAM: PORTABLE CHEST - 1 VIEW  COMPARISON:  01/05/2015  FINDINGS: Heart is enlarged. There is moderate right pleural effusion associated right basilar atelectasis. Small left pleural effusion is present. There are changes of  interstitial edema.  IMPRESSION: 1. Cardiomegaly and interstitial edema. 2. Persistent bibasilar opacity, right greater than left.   Electronically Signed   By: Norva Pavlov M.D.   On: 01/07/2015 18:02   US Abdomen Limited Ruq  01/26/2015   CLINICAL DATA:  81 year old female with a history of prior lesion on CT study 01/06/2015  EXAM: US ABDOMEN LIMITED - RIGHT UPPER QUADRANT  COMPARISON:  CT 01/06/2015  FINDINGS: Gallbladder:  Surgical changes of cholecystectomy  Common bile duct:  Diameter: 7.3 mm  Liver:  Relatively heterogeneous echotexture of the liver. Anechoic structure with through transmission and no internal complexity within the right liver lobe measures 2.8 cm x 2.5 cm x 3.1 cm. No internal flow.  IMPRESSION: Right liver lobe cyst.  Evidence of liver steatosis.  Signed,  Yvone Neu. Loreta Ave, DO  [facility]  [facility]   Electronically Signed   By: Gilmer Mor D.O.   On: 01/26/2015 07:39    Microbiology: Recent Results (from the past 240 hour(s))  MRSA PCR Screening     Status: None   Collection Time: 01/25/15  5:41 PM  Result Value Ref Range Status   MRSA by PCR NEGATIVE NEGATIVE Final    Comment:        The GeneXpert MRSA Assay (FDA approved for NASAL specimens only), is one component of a comprehensive MRSA colonization surveillance program. It is not intended to diagnose MRSA infection nor to guide or monitor treatment for MRSA infections.      Labs: Basic Metabolic Panel:  Recent Labs Lab 01/24/15 0325 01/25/15 1445 01/26/15 0415 01/27/15 0849 01/27/15 1218  NA 137 136 137 133* 136  K 4.9 3.4* 2.8* 6.2* 3.7  CL 92* 90* 91* 90* 91*  CO2 35* 37* 37* 33* 37*  GLUCOSE 85 97 92 109* 114*  BUN 25* 26* 26* 28* 26*  CREATININE 1.13* 1.06* 1.02* 1.21* 1.16*  CALCIUM 8.4* 8.5* 8.3* 8.4* 8.5*   Liver Function Tests: No results for input(s): AST, ALT, ALKPHOS, BILITOT, PROT, ALBUMIN in the last 168 hours. No results  for input(s): LIPASE, AMYLASE in the last 168 hours. No results for input(s): AMMONIA in the last 168 hours. CBC:  Recent Labs Lab 01/23/15 1516  WBC 4.5  HGB 9.9*  HCT 32.4*  MCV 101.3*  PLT 153   Cardiac Enzymes: No results for input(s):  CKTOTAL, CKMB, CKMBINDEX, TROPONINI in the last 168 hours. BNP: BNP (last 3 results)  Recent Labs  12/14/14 1410 12/27/14 0449 01/05/15 1528  BNP 1222.9* 1099.7* 972.1*    ProBNP (last 3 results) No results for input(s): PROBNP in the last 8760 hours.  CBG:  Recent Labs Lab 01/26/15 0648 01/26/15 1215 01/26/15 1700 01/26/15 2220 01/27/15 1211  GLUCAP 84 100* 93 107* 92       Signed:  Lashika Erker  [facility] 01/27/2015, 2:02 PM

## 2015-01-27 NOTE — Progress Notes (Signed)
Patient currently on airborne isolation and is in a negative pressure room. She cannot be discharged to the [facility] until she is removed from this type of isolation.  Please advise.  CSW will continue to monitor and keep SNF, patient and daughter updated.  Lorri Frederick. Jaci Lazier, [place] [phone]

## 2015-01-27 NOTE — Progress Notes (Signed)
Physical Therapy Treatment Patient Details Name: Melanie Camacho MRN: [id number] DOB: [birth year] Today's Date: 01/27/2015    History of Present Illness Pt is an 81 y./o female recently admitted to similar s/s of CHF, now back from SNF rehab with CHF.    PT Comments    Pt making slow but steady progress.  Pt requires heavy assist to elevate into standing due to poor forward weight shift. She continues to be very deconditioned and fatigues easily.  Continue to recommend SNF for next level of care.    Follow Up Recommendations  SNF     Equipment Recommendations  None recommended by PT    Recommendations for Other Services       Precautions / Restrictions Precautions Precautions: Fall Precaution Comments: very fearful of falling Restrictions Weight Bearing Restrictions: No Other Position/Activity Restrictions: pt with B unna boots    Mobility  Bed Mobility Overal bed mobility:  (pt in recliner when PT arrived)                Transfers Overall transfer level: Needs assistance Equipment used: Rolling walker (2 wheeled) Transfers: Sit to/from [employer] Sit to Stand: Max assist Stand pivot transfers: Min assist;Mod assist       General transfer comment: Pt requires max A to transfer mass over feet for forward weight shift and then when standing requires cues and assist to increase hip extension.  Once in standing, pt able to take pivoting steps to bedside commode.   Ambulation/Gait Ambulation/Gait assistance: Min assist Ambulation Distance (Feet): 7 Feet Assistive device: Rolling walker (2 wheeled) Gait Pattern/deviations: Step-to pattern;Decreased stride length;Decreased weight shift to right;Decreased weight shift to left;Shuffle;Trunk flexed Gait velocity: decr   General Gait Details: Requires increased time to complete weight shift and advance LEs with cues for upright posture and safety.    Stairs            Wheelchair Mobility     Modified Rankin (Stroke Patients Only)       Balance Overall balance assessment: Needs assistance Sitting-balance support: Feet supported Sitting balance-Leahy Scale: Fair     Standing balance support: During functional activity Standing balance-Leahy Scale: Poor Standing balance comment: Requries B UE support and min A to maintain balance.                     Cognition Arousal/Alertness: Awake/alert Behavior During Therapy: WFL for tasks assessed/performed Overall Cognitive Status: Within Functional Limits for tasks assessed                      Exercises      General Comments General comments (skin integrity, edema, etc.): Pt continues to have open areas on her back, however are beginning to close, B unna boots.       Pertinent Vitals/Pain Pain Assessment: 0-10 Pain Score: 4  Pain Location: buttocks Pain Descriptors / Indicators: Burning Pain Intervention(s): Repositioned    Home Living                      Prior Function            PT Goals (current goals can now be found in the care plan section) Acute Rehab PT Goals Patient Stated Goal: get stronger PT Goal Formulation: With patient Time For Goal Achievement: 02/07/15 Potential to Achieve Goals: Fair Progress towards PT goals: Progressing toward goals    Frequency  Min 2X/week    PT Plan Current  plan remains appropriate    Co-evaluation             End of Session   Activity Tolerance: Patient limited by fatigue Patient left: in chair;with call bell/phone within reach     Time: 1357-1450 PT Time Calculation (min) (ACUTE ONLY): 53 min  Charges:  $Therapeutic Activity: 53-67 mins                    G Codes:      Vista Deck 01/27/2015, 3:30 PM

## 2015-01-27 NOTE — Progress Notes (Signed)
Report called to Selena Batten, nurse at [facility].  Daughter will transport patient to SNF due to [facility] backup.

## 2015-01-30 ENCOUNTER — Encounter (HOSPITAL_COMMUNITY)
Admission: RE | Admit: 2015-01-30 | Discharge: 2015-01-30 | Disposition: A | Discharge status: Home / Self Care | Payer: Medicare Other | Source: Skilled Nursing Facility | Attending: Internal Medicine | Admitting: Internal Medicine

## 2015-01-30 ENCOUNTER — Non-Acute Institutional Stay (SKILLED_NURSING_FACILITY): Payer: Medicare Other | Admitting: Internal Medicine

## 2015-01-30 DIAGNOSIS — G629 Polyneuropathy, unspecified: Secondary | ICD-10-CM | POA: Diagnosis not present

## 2015-01-30 DIAGNOSIS — E1342 Other specified diabetes mellitus with diabetic polyneuropathy: Secondary | ICD-10-CM | POA: Diagnosis not present

## 2015-01-30 DIAGNOSIS — I481 Persistent atrial fibrillation: Secondary | ICD-10-CM | POA: Diagnosis not present

## 2015-01-30 DIAGNOSIS — I5042 Chronic combined systolic (congestive) and diastolic (congestive) heart failure: Secondary | ICD-10-CM | POA: Diagnosis not present

## 2015-01-30 DIAGNOSIS — E038 Other specified hypothyroidism: Secondary | ICD-10-CM | POA: Diagnosis not present

## 2015-01-30 DIAGNOSIS — R269 Unspecified abnormalities of gait and mobility: Secondary | ICD-10-CM

## 2015-01-30 DIAGNOSIS — I4819 Other persistent atrial fibrillation: Secondary | ICD-10-CM

## 2015-01-30 DIAGNOSIS — E1142 Type 2 diabetes mellitus with diabetic polyneuropathy: Secondary | ICD-10-CM

## 2015-01-30 LAB — BASIC METABOLIC PANEL
Anion gap: 11 (ref 5–15)
BUN: 33 mg/dL — AB (ref 6–20)
CO2: 37 mmol/L — ABNORMAL HIGH (ref 22–32)
Calcium: 8.5 mg/dL — ABNORMAL LOW (ref 8.9–10.3)
Chloride: 91 mmol/L — ABNORMAL LOW (ref 101–111)
Creatinine, Ser: 1.22 mg/dL — ABNORMAL HIGH (ref 0.44–1.00)
GFR calc Af Amer: 47 mL/min — ABNORMAL LOW (ref >60)
GFR, EST NON AFRICAN AMERICAN: 41 mL/min — AB (ref >60)
Glucose, Bld: 75 mg/dL (ref 65–99)
POTASSIUM: 3.2 mmol/L — AB (ref 3.5–5.1)
SODIUM: 139 mmol/L (ref 135–145)

## 2015-01-30 LAB — CBC
HCT: 32.1 % — ABNORMAL LOW (ref 36.0–46.0)
Hemoglobin: 9.9 g/dL — ABNORMAL LOW (ref 12.0–15.0)
MCH: 32 pg (ref 26.0–34.0)
MCHC: 30.8 g/dL (ref 30.0–36.0)
MCV: 103.9 fL — ABNORMAL HIGH (ref 78.0–100.0)
Platelets: 181 10*3/uL (ref 150–400)
RBC: 3.09 MIL/uL — ABNORMAL LOW (ref 3.87–5.11)
RDW: 20 % — ABNORMAL HIGH (ref 11.5–15.5)
WBC: 2.9 10*3/uL — ABNORMAL LOW (ref 4.0–10.5)

## 2015-01-30 NOTE — Progress Notes (Signed)
Patient ID: Melanie Camacho, female   DOB: 23-Aug-1932, 81 y.o.   MRN: [id number]  Facility; [facility] Chief complaint; admission to SNF post admit to [facility] from 4/28 to 5/20. Previously was in [facility] from 4/6 through 4/22   History; this was a patient who was in [facility] hospital from 4/6 through 4/22 this was with diastolic heart failure labile this acute on chronic. She was aggressively diuresed and her body weight decreased by 22 pounds at discharge. She was discharged to an SNF in [facility]. She states they "just about killed me". She was readmitted to hospital with acute on chronic diastolic heart failure with anasarca. He was aggressively diuresis and lost 28 pounds. Her discharge weight was 210 pounds she was changed to torsemide orally on 5/18. Her blood pressure was "soft" therefore she didn't think she could be more aggressively diureses. Her creatinine was stable. He has had 3 echocardiograms since the beginning of April. The last to show ejection fractions of 40-45%. I am not certain that this classifies as "preserved". Patient tells me that she was in hospital in April 2015 with an MI. Had a cardiac catheterization at that time.    ------------------------------------------------------------------- Transthoracic Echocardiography  Patient:    Melanie Camacho, Melanie Camacho MR #:       [id number] Study Date: 01/24/2015 Gender:     F Age:        82 Height:     154.9 cm Weight:     99.3 kg BSA:        2.12 m^2 Pt. Status: Room:       2C01C   ATTENDING    Zannie Cove [id number]  ADMITTING    Lorretta Harp [id number]  SONOGRAPHER  Delcie Roch, RDCS, CCT  ORDERING     Bensimhon, Daniel  PERFORMING   [facility], Inpatient  cc:  ------------------------------------------------------------------- LV EF: 40% -   45%  ------------------------------------------------------------------- Indications:      Dyspnea  786.09.  ------------------------------------------------------------------- History:   PMH:   Atrial fibrillation.  Congestive heart failure. Risk factors:  PVC&'s. Aortic valve replacement. Anasarca. Right ventricle failure. Chronic kidney disease. Hypertension. Diabetes mellitus.  ------------------------------------------------------------------- Study Conclusions  - Left ventricle: The cavity size was normal. Systolic function was   mildly to moderately reduced. The estimated ejection fraction was   in the range of 40% to 45%. Regional wall motion abnormalities   cannot be excluded. - Aortic valve: There was mild regurgitation. - Mitral valve: Calcified annulus. Mildly thickened leaflets .   There was mild regurgitation. - Right ventricle: The cavity size was mildly dilated. Systolic   function was mildly reduced. - Right atrium: The atrium was mildly dilated. - Pulmonary arteries: PA peak pressure: 32 mm Hg (S). - Pericardium, extracardiac: A trivial pericardial effusion was   identified.  Transthoracic echocardiography.  M-mode, limited 2D, limited spectral Doppler, and color Doppler.  Birthdate:  Patient birthdate: March 26, 1932.  Age:  Patient is 81 yr old.  Sex:  Gender: female.    BMI: 41.4 kg/m^2.  Blood pressure:     90/43  Patient status:  Inpatient.  Study date:  Study date: 01/24/2015. Study time: 05:00 PM.  Location:  Bedside.  -------------------------------------------------------------------  ------------------------------------------------------------------- Left ventricle:  The cavity size was normal. Systolic function was mildly to moderately reduced. The estimated ejection fraction was in the range of 40% to 45%. Regional wall motion abnormalities cannot be excluded. The study was not technically sufficient to allow evaluation of LV diastolic dysfunction due  to  atrial fibrillation.  ------------------------------------------------------------------- Aortic valve:  The leaflets of the aortic valve prosthesis are not adequately visualized. Poorly visualized.  Doppler:  There was mild regurgitation.  ------------------------------------------------------------------- Mitral valve:   Calcified annulus. Mildly thickened leaflets . Doppler:  There was mild regurgitation.  ------------------------------------------------------------------- Right ventricle:  The cavity size was mildly dilated. Systolic function was mildly reduced.  ------------------------------------------------------------------- Pulmonic valve:    Mildly thickened leaflets.  Doppler:  There was mild regurgitation.  ------------------------------------------------------------------- Tricuspid valve:   Doppler:  There was mild regurgitation.  ------------------------------------------------------------------- Right atrium:  The atrium was mildly dilated.  ------------------------------------------------------------------- Pericardium:  A trivial pericardial effusion was identified.  ------------------------------------------------------------------- Measurements   Left ventricle                        Value       Reference  LV ID, ED, PLAX chordal        (L)    41.2  mm    43 - 52  LV ID, ES, PLAX chordal               33.1  mm    23 - 38  LV fx shortening, PLAX chordal (L)    20    %     >=29    LVOT                                  Value       Reference  LVOT ID, S                            18    mm    ---------  LVOT area                             2.54  cm^2  ---------    Pulmonary arteries                    Value       Reference  PA pressure, S, DP             (H)    32    mm Hg <=30    Tricuspid valve                       Value       Reference  Tricuspid regurg peak velocity        262   cm/s  ---------  Tricuspid peak RV-RA gradient         27    mm Hg  ---------    Systemic veins                        Value       Reference  Estimated CVP                         5     mm Hg ---------    Right ventricle                       Value       Reference  RV pressure, S, DP             (  H)    32    mm Hg <=30  Legend: (L)  and  (H)  mark values outside specified reference range.    CBC Latest Ref Rng 01/23/2015 01/20/2015 01/16/2015  WBC 4.0 - 10.5 K/uL 4.5 3.8(L) 3.8(L)  Hemoglobin 12.0 - 15.0 g/dL 1.6(X) 0.9(U) 10.6(L)  Hematocrit 36.0 - 46.0 % 32.4(L) 31.5(L) 34.5(L)  Platelets 150 - 400 K/uL 153 101(L) 110(L)   Lab Results  Component Value Date   CREATININE 1.16* 01/27/2015   CREATININE 1.21* 01/27/2015   CREATININE 1.02* 01/26/2015    Wt Readings from Last 3 Encounters:  01/26/15 210 lb 3.2 oz (95.346 kg)  12/30/14 230 lb 9.6 oz (104.599 kg)  12/29/13 193 lb 5.5 oz (87.7 kg)    Past Medical History  Diagnosis Date  . Anginal pain   . Hypertension   . Heart murmur   . CHF (congestive heart failure)   . Shortness of breath   . Complication of anesthesia     "I had a slight reaction to the morphine given in surgery"  . High cholesterol   . Coronary artery disease   . Myocardial infarction 12/2013  . Pneumonia "once or twice"  . 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 Type II diabetes mellitus   . Anemia   . History of blood transfusion     "related to the anemia"  . GERD (gastroesophageal reflux disease)   . Arthritis     "all over"  . Chronic lower back pain     Past Surgical History  Procedure Laterality Date  . Left heart catheterization with coronary angiogram N/A 12/28/2013    Procedure: LEFT HEART CATHETERIZATION WITH CORONARY ANGIOGRAM;  Surgeon: Lennette Bihari, MD;  Location: [facility] CATH LAB;  Service: Cardiovascular;  Laterality: N/A;  . Cardiac catheterization    . Cholecystectomy open  1980's  . Appendectomy  ~ 1948  . Tonsillectomy  1954  . Hernia repair    . Abdominal hernia repair  "many times"   . Hemorrhoid banding    . Fracture surgery    . Foot fracture surgery Right ~ 1980  . Cataract extraction Right    Current Outpatient Prescriptions on File Prior to Visit  Medication Sig Dispense Refill  . apixaban (ELIQUIS) 2.5 MG TABS tablet Take 1 tablet (2.5 mg total) by mouth 2 (two) times daily.    . bisoprolol (ZEBETA) 5 MG tablet Take 1 tablet (5 mg total) by mouth daily. (Patient taking differently: Take 10 mg by mouth daily. ) 30 tablet 11  . Cholecalciferol (VITAMIN D3) 5000 UNITS CAPS Take 5,000 Units by mouth daily.    . colesevelam (WELCHOL) 625 MG tablet Take 1,875 mg by mouth 2 (two) times daily with a meal.    . docusate sodium (COLACE) 100 MG capsule Take 1 capsule (100 mg total) by mouth 2 (two) times daily. 10 capsule 0  . ferrous fumarate (HEMOCYTE - 106 MG FE) 325 (106 FE) MG TABS tablet Take 1 tablet by mouth daily.    Marland Kitchen gabapentin (NEURONTIN) 100 MG capsule Take 1 capsule (100 mg total) by mouth 3 (three) times daily.    . hydrocortisone (ANUSOL-HC) 2.5 % rectal cream Place rectally 3 (three) times daily. 30 g 0  . lactulose (CHRONULAC) 10 GM/15ML solution Take 15 mLs (10 g total) by mouth 2 (two) times daily.  0  . levothyroxine (SYNTHROID, LEVOTHROID) 25 MCG tablet Take 1 tablet (25 mcg total)  by mouth daily before breakfast.    . Multiple Vitamins-Minerals (ICAPS) CAPS Take 2 capsules by mouth daily.     Marland Kitchen oxycodone (OXY-IR) 5 MG capsule Take 1 capsule (5 mg total) by mouth every 6 (six) hours as needed for pain. 10 capsule 0  . pantoprazole (PROTONIX) 40 MG tablet Take 1 tablet (40 mg total) by mouth at bedtime.    . potassium chloride SA (K-DUR,KLOR-CON) 20 MEQ tablet Take 2 tablets (40 mEq total) by mouth daily. 30 tablet 0  . rOPINIRole (REQUIP) 0.25 MG tablet Take 1 tablet (0.25 mg total) by mouth at bedtime.    . torsemide (DEMADEX) 20 MG tablet Take 1 tablet (20 mg total) by mouth 2 (two) times daily.      Social; the patient has been living with her  daughter in [place] I am not exactly sure of her functional level. She has a walker and wheelchair and she tells me there was a ramp in the home  reports that she has never smoked. She has never used smokeless tobacco. She reports that she does not drink alcohol or use illicit drugs.  Family history; not currently available  Review of systems Respiratory; patient states she is not short of breath Chest; has right sided chest pain under her breast itches constant Cardiac no clear exertional chest pain GI no abdominal pain Skin tells me she has had's skin lesions on her legs especially on the left in the past. Musculoskeletal; limited range of both shoulders which she says is a combination of rotator cuff and osteoarthritis. Neurologic; takes Requip for restless legs  Physical examination; Vitals; BP 96/45 respirations 20 pulse 68 and A. fib temperature 97.5 Weights; on 5/22 her weight was 210 pounds roughly the same as when she left the hospital CBGs; yesterday 80/1:15/112/103. This morning this was 82 HEENT; no oral lesions Chest wall; she is tender near the costochondral junction on the right Respiratory; clear air entry bilaterally no crackles no wheezing Cardiac; A. fib no S3 JVP elevated at the angle of the jaw at 45. S1 normal S2 accentuated there is a 2/6 pansystolic murmur at the PMI that does not radiate Abdomen; multiple incisional hernias. Appears to have a large right ventral hernia. Skin has lymphedema and anasarca right up into her right lateral chest. Liver and spleen are not palpable Extremities; there is bilateral stasis physiology and probably some degree of lymphedema. Pitting edema up the back of her legs. ; neck on her back at roughlyAt T 8 or 9 on the left as well was determined shingles. This has some eschar. There are no active blisters. Think there is no  active cellulitis Neurologic; mild right upper motor neuron facial weakness. She is mildly  hyperreflexic on the right both plantars are downgoing 3 out of 5 hip flexion 3+ out of 5 hip abduction Mental status; I didn't detect any major abnormalities here. Somewhat depressed affect. Gait; maximal assist to the sitting position.  Impression/plan #1; mostly diastolically mediated heart failure; it is true that she seems to have a reasonably low blood pressure. I am not sure how much more diuresis this can take. However her weights are stable. She however is in a fluid overloaded state. I will see how this is going over the next 2 days before considering increasing her diuretic any further. #2 A. fib rate in the 60s she is on beta blockers and Eliquis. I'll need to verify how much bisoprolol she is actually taking #3  she says she is a diabetic with diabetic neuropathy. She is not currently on anything for diabetes her last hemoglobin A1c was 5.5 on 4/7 #4 hypothyroidism; TSH was 15.7 on 5/4. See when she was started on her current Synthroid #5 multiple abdominal herniations including a large one into the right flank. She has lymphedema on the right and all of this tends to flow to the right. #6 chest wall tenderness which I think is probably costochondritis #7 restless leg syndrome. She states current dose of Requip "doesn't do anything for her" #8 history of coronary artery disease status post MI. Cardiac catheterization was a little over a year ago. She is also had an aortic valve replaced. She does not appear to be having active exertional chest pain that I can determine  This patient is very physically challenged. It took a maximal assist of one person to get her to sit in bed. She has significant bilateral lower extremity proximal weakness which may be disuse plus or minus diabetic neuropathy.

## 2015-02-01 ENCOUNTER — Encounter (HOSPITAL_COMMUNITY)
Admission: AD | Admit: 2015-02-01 | Discharge: 2015-02-01 | Disposition: A | Discharge status: Home / Self Care | Payer: Medicare Other | Source: Skilled Nursing Facility | Attending: Internal Medicine | Admitting: Internal Medicine

## 2015-02-01 ENCOUNTER — Non-Acute Institutional Stay (SKILLED_NURSING_FACILITY): Payer: Medicare Other | Admitting: Internal Medicine

## 2015-02-01 DIAGNOSIS — E1122 Type 2 diabetes mellitus with diabetic chronic kidney disease: Secondary | ICD-10-CM | POA: Diagnosis not present

## 2015-02-01 DIAGNOSIS — I4819 Other persistent atrial fibrillation: Secondary | ICD-10-CM

## 2015-02-01 DIAGNOSIS — I5042 Chronic combined systolic (congestive) and diastolic (congestive) heart failure: Secondary | ICD-10-CM

## 2015-02-01 DIAGNOSIS — N183 Chronic kidney disease, stage 3 unspecified: Secondary | ICD-10-CM

## 2015-02-01 DIAGNOSIS — N189 Chronic kidney disease, unspecified: Secondary | ICD-10-CM

## 2015-02-01 DIAGNOSIS — I481 Persistent atrial fibrillation: Secondary | ICD-10-CM

## 2015-02-01 LAB — CBC WITH DIFFERENTIAL/PLATELET
Basophils Absolute: 0.1 10*3/uL (ref 0.0–0.1)
Basophils Relative: 3 % — ABNORMAL HIGH (ref 0–1)
EOS PCT: 5 % (ref 0–5)
Eosinophils Absolute: 0.1 10*3/uL (ref 0.0–0.7)
HEMATOCRIT: 34.4 % — AB (ref 36.0–46.0)
HEMOGLOBIN: 10.5 g/dL — AB (ref 12.0–15.0)
Lymphocytes Relative: 14 % (ref 12–46)
Lymphs Abs: 0.4 10*3/uL — ABNORMAL LOW (ref 0.7–4.0)
MCH: 31.8 pg (ref 26.0–34.0)
MCHC: 30.5 g/dL (ref 30.0–36.0)
MCV: 104.2 fL — ABNORMAL HIGH (ref 78.0–100.0)
MONO ABS: 0.6 10*3/uL (ref 0.1–1.0)
MONOS PCT: 24 % — AB (ref 3–12)
Neutro Abs: 1.4 10*3/uL — ABNORMAL LOW (ref 1.7–7.7)
Neutrophils Relative %: 54 % (ref 43–77)
PLATELETS: 171 10*3/uL (ref 150–400)
RBC: 3.3 MIL/uL — ABNORMAL LOW (ref 3.87–5.11)
RDW: 20.2 % — ABNORMAL HIGH (ref 11.5–15.5)
WBC: 2.6 10*3/uL — AB (ref 4.0–10.5)

## 2015-02-01 LAB — BASIC METABOLIC PANEL
Anion gap: 11 (ref 5–15)
BUN: 36 mg/dL — AB (ref 6–20)
CO2: 35 mmol/L — ABNORMAL HIGH (ref 22–32)
CREATININE: 1.3 mg/dL — AB (ref 0.44–1.00)
Calcium: 8.6 mg/dL — ABNORMAL LOW (ref 8.9–10.3)
Chloride: 94 mmol/L — ABNORMAL LOW (ref 101–111)
GFR calc Af Amer: 44 mL/min — ABNORMAL LOW (ref >60)
GFR calc non Af Amer: 38 mL/min — ABNORMAL LOW (ref >60)
GLUCOSE: 79 mg/dL (ref 65–99)
Potassium: 3.7 mmol/L (ref 3.5–5.1)
Sodium: 140 mmol/L (ref 135–145)

## 2015-02-01 LAB — VITAMIN B12: Vitamin B-12: 511 pg/mL (ref 180–914)

## 2015-02-01 LAB — FOLATE: Folate: 22.8 ng/mL (ref >5.9)

## 2015-02-01 NOTE — Progress Notes (Signed)
Patient ID: Melanie Camacho, female   DOB: [birth year], 81 y.o.   MRN: [id number] Facility; [facility] Chief complaint; follow-up CHF/edema. History; this is a patient I admitted to the building on 5/23. She has mostly diastolic but also some systolic heart failure with an estimated EF of 40-45% she is been apparently in the [facility] for most of the last 2 months with congestive heart failure. She does not maintain a very high blood pressure therefore it is been difficult to diuresis her. I thought she was in a fluid overloaded state on her arrival here based not just on lower extremity edema which was impressive however she had coccyx edema elevated jugular venous pressure. Her weights have reflected that to 10.6/to 10.0/211.6/to 10.3 which was on 5/24  The patient states she episodically feel short of breath she has not noted more edema however. She did develop a fairly significant superficial blister on her left leg get. She came here on [employer] boots which the facility cannot really apply brackets I didn't previously know this]  BMP Latest Ref Rng 02/01/2015 01/30/2015 01/27/2015  Glucose 65 - 99 mg/dL 79 75 409(W)  BUN 6 - 20 mg/dL 11(B) 14(N) 82(N)  Creatinine 0.44 - 1.00 mg/dL 5.62(Z) 3.08(M) 5.78(I)  Sodium 135 - 145 mmol/L 140 139 136  Potassium 3.5 - 5.1 mmol/L 3.7 3.2(L) 3.7  Chloride 101 - 111 mmol/L 94(L) 91(L) 91(L)  CO2 22 - 32 mmol/L 35(H) 37(H) 37(H)  Calcium 8.9 - 10.3 mg/dL 6.9(G) 2.9(B) 2.8(U)   CBC Latest Ref Rng 02/01/2015 01/30/2015 01/23/2015  WBC 4.0 - 10.5 K/uL 2.6(L) 2.9(L) 4.5  Hemoglobin 12.0 - 15.0 g/dL 10.5(L) 9.9(L) 9.9(L)  Hematocrit 36.0 - 46.0 % 34.4(L) 32.1(L) 32.4(L)  Platelets 150 - 400 K/uL 171 181 153      Past Medical History  Diagnosis Date  . Anginal pain   . Hypertension   . Heart murmur   . CHF (congestive heart failure)   . Shortness of breath   . Complication of anesthesia     "I had a slight reaction to the morphine given in surgery"  . High  cholesterol   . Coronary artery disease   . Myocardial infarction 12/2013  . Pneumonia "once or twice"  . 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 Type II diabetes mellitus   . Anemia   . History of blood transfusion     "related to the anemia"  . GERD (gastroesophageal reflux disease)   . Arthritis     "all over"  . Chronic lower back pain     Current Outpatient Prescriptions on File Prior to Visit  Medication Sig Dispense Refill  . apixaban (ELIQUIS) 2.5 MG TABS tablet Take 1 tablet (2.5 mg total) by mouth 2 (two) times daily.    . bisoprolol (ZEBETA) 5 MG tablet Take 1 tablet (5 mg total) by mouth daily. (Patient taking differently: Take 10 mg by mouth daily. ) 30 tablet 11  . Cholecalciferol (VITAMIN D3) 5000 UNITS CAPS Take 5,000 Units by mouth daily.    . colesevelam (WELCHOL) 625 MG tablet Take 1,875 mg by mouth 2 (two) times daily with a meal.    . docusate sodium (COLACE) 100 MG capsule Take 1 capsule (100 mg total) by mouth 2 (two) times daily. 10 capsule 0  . ferrous fumarate (HEMOCYTE - 106 MG FE) 325 (106 FE) MG TABS tablet Take 1 tablet by mouth daily.    Marland Kitchen gabapentin (  NEURONTIN) 100 MG capsule Take 1 capsule (100 mg total) by mouth 3 (three) times daily.    . hydrocortisone (ANUSOL-HC) 2.5 % rectal cream Place rectally 3 (three) times daily. 30 g 0  . lactulose (CHRONULAC) 10 GM/15ML solution Take 15 mLs (10 g total) by mouth 2 (two) times daily.  0  . levothyroxine (SYNTHROID, LEVOTHROID) 25 MCG tablet Take 1 tablet (25 mcg total) by mouth daily before breakfast.    . Multiple Vitamins-Minerals (ICAPS) CAPS Take 2 capsules by mouth daily.     Marland Kitchen oxycodone (OXY-IR) 5 MG capsule Take 1 capsule (5 mg total) by mouth every 6 (six) hours as needed for pain. 10 capsule 0  . pantoprazole (PROTONIX) 40 MG tablet Take 1 tablet (40 mg total) by mouth at bedtime.    . potassium chloride SA (K-DUR,KLOR-CON) 20 MEQ tablet Take 2 tablets (40 mEq total) by mouth daily. 30 tablet 0   . rOPINIRole (REQUIP) 0.25 MG tablet Take 1 tablet (0.25 mg total) by mouth at bedtime.    . torsemide (DEMADEX) 20 MG tablet Take 1 tablet (20 mg total) by mouth 2 (two) times dily.     Review of systems Respiratory; episodic shortness of breath Cardiac no chest pain or palpitations Extremities she does not feel her edema has changed all that much  Physical examination Gen. patient is not in any distress. Weights. Please see above. She is at the same weight as when she left the hospital roughly 210 pounds Blood pressure running from 110/70-130/70. 2 days earlier this month with a blood pressure was in the 90s systolic although most of the time she seems to be in the 1 tender 1:30 range. O2 sat at 96% on room air pulse rate is between 55 and 60 Respiratory; by basilar crackles no wheezing Cardiac; heart sounds are slow there is no S3 2/6 pansystolic murmur. JVP is elevated at 90 [sitting up in a chair] she has massive lower extremity edema extending to her groin. She has 2+ coccyx edema Ext: veinous statis. Probably some degree of lymphedema. However there is pitting edema well up into her groin   Impression/plan #1 biventricular heart failure; I'm going to try to increase her Demadex, although I'll try to do this gently. Her creatinine is currently 1.3 BUN at 36 potassium at 3.7. I will need to go up on the potassium as well. #2 "soft" blood pressures. Not anything that could be affecting this pharmacologically is her beta blocker. Her pulse rate is between 55 and 60 I will attempt to reduce this #3 neutropenia. Her white count this morning is 2.6 54% neutrophils relatively high monocyte count at 24%. She has macrocytic anemia. A B12 level is pending #4 hypokalemia; this was mild but it has been corrected. #5 shingles; this was treated in the hospital I have not looked at this today. #6 atrial fibrillation she is on Eliquis and bisoprolol. I am presuming this is chronic. Perhaps she is not  tolerating this well.  #7 status post aVR [tissue] 2011 at [facility] #8 chronic renal failure secondary to diabetes stage III

## 2015-02-04 ENCOUNTER — Inpatient Hospital Stay (HOSPITAL_COMMUNITY)
Admission: EM | Admit: 2015-02-04 | Discharge: 2015-03-10 | DRG: 871 | Disposition: E | Payer: Medicare Other | Attending: Internal Medicine | Admitting: Internal Medicine

## 2015-02-04 ENCOUNTER — Emergency Department (HOSPITAL_COMMUNITY): Payer: Medicare Other

## 2015-02-04 ENCOUNTER — Encounter (HOSPITAL_COMMUNITY): Payer: Self-pay

## 2015-02-04 DIAGNOSIS — Z515 Encounter for palliative care: Secondary | ICD-10-CM

## 2015-02-04 DIAGNOSIS — R160 Hepatomegaly, not elsewhere classified: Secondary | ICD-10-CM | POA: Diagnosis present

## 2015-02-04 DIAGNOSIS — N170 Acute kidney failure with tubular necrosis: Secondary | ICD-10-CM | POA: Diagnosis not present

## 2015-02-04 DIAGNOSIS — E785 Hyperlipidemia, unspecified: Secondary | ICD-10-CM | POA: Diagnosis not present

## 2015-02-04 DIAGNOSIS — I5043 Acute on chronic combined systolic (congestive) and diastolic (congestive) heart failure: Secondary | ICD-10-CM | POA: Diagnosis present

## 2015-02-04 DIAGNOSIS — I482 Chronic atrial fibrillation, unspecified: Secondary | ICD-10-CM | POA: Diagnosis present

## 2015-02-04 DIAGNOSIS — Z952 Presence of prosthetic heart valve: Secondary | ICD-10-CM

## 2015-02-04 DIAGNOSIS — I5021 Acute systolic (congestive) heart failure: Secondary | ICD-10-CM | POA: Diagnosis not present

## 2015-02-04 DIAGNOSIS — I429 Cardiomyopathy, unspecified: Secondary | ICD-10-CM | POA: Diagnosis present

## 2015-02-04 DIAGNOSIS — B961 Klebsiella pneumoniae [K. pneumoniae] as the cause of diseases classified elsewhere: Secondary | ICD-10-CM | POA: Diagnosis not present

## 2015-02-04 DIAGNOSIS — I5022 Chronic systolic (congestive) heart failure: Secondary | ICD-10-CM | POA: Diagnosis not present

## 2015-02-04 DIAGNOSIS — E039 Hypothyroidism, unspecified: Secondary | ICD-10-CM | POA: Diagnosis not present

## 2015-02-04 DIAGNOSIS — E875 Hyperkalemia: Secondary | ICD-10-CM | POA: Diagnosis not present

## 2015-02-04 DIAGNOSIS — I248 Other forms of acute ischemic heart disease: Secondary | ICD-10-CM | POA: Diagnosis present

## 2015-02-04 DIAGNOSIS — J189 Pneumonia, unspecified organism: Secondary | ICD-10-CM | POA: Diagnosis not present

## 2015-02-04 DIAGNOSIS — K761 Chronic passive congestion of liver: Secondary | ICD-10-CM | POA: Diagnosis not present

## 2015-02-04 DIAGNOSIS — I5033 Acute on chronic diastolic (congestive) heart failure: Secondary | ICD-10-CM | POA: Diagnosis present

## 2015-02-04 DIAGNOSIS — I2510 Atherosclerotic heart disease of native coronary artery without angina pectoris: Secondary | ICD-10-CM | POA: Diagnosis present

## 2015-02-04 DIAGNOSIS — E162 Hypoglycemia, unspecified: Secondary | ICD-10-CM

## 2015-02-04 DIAGNOSIS — A419 Sepsis, unspecified organism: Principal | ICD-10-CM | POA: Diagnosis present

## 2015-02-04 DIAGNOSIS — G9341 Metabolic encephalopathy: Secondary | ICD-10-CM | POA: Diagnosis present

## 2015-02-04 DIAGNOSIS — M545 Low back pain: Secondary | ICD-10-CM | POA: Diagnosis present

## 2015-02-04 DIAGNOSIS — Z953 Presence of xenogenic heart valve: Secondary | ICD-10-CM | POA: Diagnosis not present

## 2015-02-04 DIAGNOSIS — D7589 Other specified diseases of blood and blood-forming organs: Secondary | ICD-10-CM | POA: Diagnosis not present

## 2015-02-04 DIAGNOSIS — E11649 Type 2 diabetes mellitus with hypoglycemia without coma: Secondary | ICD-10-CM | POA: Diagnosis not present

## 2015-02-04 DIAGNOSIS — I252 Old myocardial infarction: Secondary | ICD-10-CM

## 2015-02-04 DIAGNOSIS — N183 Chronic kidney disease, stage 3 unspecified: Secondary | ICD-10-CM | POA: Diagnosis present

## 2015-02-04 DIAGNOSIS — Z66 Do not resuscitate: Secondary | ICD-10-CM | POA: Diagnosis not present

## 2015-02-04 DIAGNOSIS — G8929 Other chronic pain: Secondary | ICD-10-CM | POA: Diagnosis present

## 2015-02-04 DIAGNOSIS — E119 Type 2 diabetes mellitus without complications: Secondary | ICD-10-CM

## 2015-02-04 DIAGNOSIS — Z4659 Encounter for fitting and adjustment of other gastrointestinal appliance and device: Secondary | ICD-10-CM

## 2015-02-04 DIAGNOSIS — I10 Essential (primary) hypertension: Secondary | ICD-10-CM | POA: Diagnosis present

## 2015-02-04 DIAGNOSIS — I129 Hypertensive chronic kidney disease with stage 1 through stage 4 chronic kidney disease, or unspecified chronic kidney disease: Secondary | ICD-10-CM | POA: Diagnosis not present

## 2015-02-04 DIAGNOSIS — R6521 Severe sepsis with septic shock: Secondary | ICD-10-CM | POA: Diagnosis present

## 2015-02-04 DIAGNOSIS — D696 Thrombocytopenia, unspecified: Secondary | ICD-10-CM | POA: Insufficient documentation

## 2015-02-04 DIAGNOSIS — R791 Abnormal coagulation profile: Secondary | ICD-10-CM | POA: Diagnosis not present

## 2015-02-04 DIAGNOSIS — R109 Unspecified abdominal pain: Secondary | ICD-10-CM

## 2015-02-04 DIAGNOSIS — T68XXXD Hypothermia, subsequent encounter: Secondary | ICD-10-CM | POA: Diagnosis not present

## 2015-02-04 DIAGNOSIS — D61818 Other pancytopenia: Secondary | ICD-10-CM | POA: Diagnosis present

## 2015-02-04 DIAGNOSIS — G9340 Encephalopathy, unspecified: Secondary | ICD-10-CM | POA: Diagnosis not present

## 2015-02-04 DIAGNOSIS — I509 Heart failure, unspecified: Secondary | ICD-10-CM

## 2015-02-04 DIAGNOSIS — J9621 Acute and chronic respiratory failure with hypoxia: Secondary | ICD-10-CM | POA: Diagnosis present

## 2015-02-04 DIAGNOSIS — R410 Disorientation, unspecified: Secondary | ICD-10-CM | POA: Diagnosis not present

## 2015-02-04 DIAGNOSIS — N179 Acute kidney failure, unspecified: Secondary | ICD-10-CM | POA: Diagnosis not present

## 2015-02-04 DIAGNOSIS — D638 Anemia in other chronic diseases classified elsewhere: Secondary | ICD-10-CM | POA: Diagnosis not present

## 2015-02-04 DIAGNOSIS — N390 Urinary tract infection, site not specified: Secondary | ICD-10-CM | POA: Diagnosis present

## 2015-02-04 DIAGNOSIS — N189 Chronic kidney disease, unspecified: Secondary | ICD-10-CM | POA: Insufficient documentation

## 2015-02-04 DIAGNOSIS — T68XXXS Hypothermia, sequela: Secondary | ICD-10-CM | POA: Diagnosis not present

## 2015-02-04 DIAGNOSIS — N289 Disorder of kidney and ureter, unspecified: Secondary | ICD-10-CM

## 2015-02-04 DIAGNOSIS — Z79899 Other long term (current) drug therapy: Secondary | ICD-10-CM

## 2015-02-04 DIAGNOSIS — R4182 Altered mental status, unspecified: Secondary | ICD-10-CM | POA: Diagnosis present

## 2015-02-04 DIAGNOSIS — E2740 Unspecified adrenocortical insufficiency: Secondary | ICD-10-CM | POA: Diagnosis not present

## 2015-02-04 DIAGNOSIS — Z7901 Long term (current) use of anticoagulants: Secondary | ICD-10-CM

## 2015-02-04 DIAGNOSIS — E669 Obesity, unspecified: Secondary | ICD-10-CM | POA: Diagnosis not present

## 2015-02-04 DIAGNOSIS — K219 Gastro-esophageal reflux disease without esophagitis: Secondary | ICD-10-CM | POA: Diagnosis present

## 2015-02-04 DIAGNOSIS — R400 Somnolence: Secondary | ICD-10-CM | POA: Diagnosis not present

## 2015-02-04 DIAGNOSIS — Z6841 Body Mass Index (BMI) 40.0 and over, adult: Secondary | ICD-10-CM | POA: Diagnosis not present

## 2015-02-04 DIAGNOSIS — B9620 Unspecified Escherichia coli [E. coli] as the cause of diseases classified elsewhere: Secondary | ICD-10-CM | POA: Diagnosis not present

## 2015-02-04 DIAGNOSIS — T68XXXA Hypothermia, initial encounter: Secondary | ICD-10-CM | POA: Diagnosis not present

## 2015-02-04 LAB — COMPREHENSIVE METABOLIC PANEL
ALT: 16 U/L (ref 14–54)
ALT: 14 U/L (ref 14–54)
AST: 34 U/L (ref 15–41)
AST: 28 U/L (ref 15–41)
Albumin: 3.2 g/dL — ABNORMAL LOW (ref 3.5–5.0)
Albumin: 2.4 g/dL — ABNORMAL LOW (ref 3.5–5.0)
Alkaline Phosphatase: 74 U/L (ref 38–126)
Alkaline Phosphatase: 63 U/L (ref 38–126)
Anion gap: 6 (ref 5–15)
Anion gap: 6 (ref 5–15)
BILIRUBIN TOTAL: 1.1 mg/dL (ref 0.3–1.2)
BUN: 41 mg/dL — ABNORMAL HIGH (ref 6–20)
BUN: 38 mg/dL — AB (ref 6–20)
CALCIUM: 8.3 mg/dL — AB (ref 8.9–10.3)
CO2: 30 mmol/L (ref 22–32)
CO2: 30 mmol/L (ref 22–32)
CREATININE: 1.41 mg/dL — AB (ref 0.44–1.00)
Calcium: 8.9 mg/dL (ref 8.9–10.3)
Chloride: 103 mmol/L (ref 101–111)
Chloride: 104 mmol/L (ref 101–111)
Creatinine, Ser: 1.37 mg/dL — ABNORMAL HIGH (ref 0.44–1.00)
GFR calc Af Amer: 41 mL/min — ABNORMAL LOW (ref >60)
GFR calc non Af Amer: 35 mL/min — ABNORMAL LOW (ref >60)
GFR calc non Af Amer: 34 mL/min — ABNORMAL LOW (ref >60)
GFR, EST AFRICAN AMERICAN: 40 mL/min — AB (ref >60)
Glucose, Bld: 76 mg/dL (ref 65–99)
Glucose, Bld: 70 mg/dL (ref 65–99)
POTASSIUM: 5.4 mmol/L — AB (ref 3.5–5.1)
Potassium: 5.7 mmol/L — ABNORMAL HIGH (ref 3.5–5.1)
SODIUM: 139 mmol/L (ref 135–145)
Sodium: 140 mmol/L (ref 135–145)
TOTAL PROTEIN: 6.3 g/dL — AB (ref 6.5–8.1)
Total Bilirubin: 1 mg/dL (ref 0.3–1.2)
Total Protein: 7.4 g/dL (ref 6.5–8.1)

## 2015-02-04 LAB — URINE MICROSCOPIC-ADD ON

## 2015-02-04 LAB — URINALYSIS, ROUTINE W REFLEX MICROSCOPIC
BILIRUBIN URINE: NEGATIVE
BILIRUBIN URINE: NEGATIVE
GLUCOSE, UA: NEGATIVE mg/dL
Glucose, UA: NEGATIVE mg/dL
Ketones, ur: NEGATIVE mg/dL
Ketones, ur: NEGATIVE mg/dL
Nitrite: POSITIVE — AB
Nitrite: POSITIVE — AB
PH: 6.5 (ref 5.0–8.0)
PROTEIN: NEGATIVE mg/dL
Protein, ur: 30 mg/dL — AB
SPECIFIC GRAVITY, URINE: 1.012 (ref 1.005–1.030)
Specific Gravity, Urine: 1.01 (ref 1.005–1.030)
UROBILINOGEN UA: 0.2 mg/dL (ref 0.0–1.0)
Urobilinogen, UA: 0.2 mg/dL (ref 0.0–1.0)
pH: 7 (ref 5.0–8.0)

## 2015-02-04 LAB — POCT I-STAT 3, ART BLOOD GAS (G3+)
ACID-BASE EXCESS: 6 mmol/L — AB (ref 0.0–2.0)
Bicarbonate: 28.2 mEq/L — ABNORMAL HIGH (ref 20.0–24.0)
O2 Saturation: 100 %
Patient temperature: 93.3
TCO2: 29 mmol/L (ref 0–100)
pCO2 arterial: 29.4 mmHg — ABNORMAL LOW (ref 35.0–45.0)
pH, Arterial: 7.581 — ABNORMAL HIGH (ref 7.350–7.450)
pO2, Arterial: 151 mmHg — ABNORMAL HIGH (ref 80.0–100.0)

## 2015-02-04 LAB — CBC WITH DIFFERENTIAL/PLATELET
BASOS ABS: 0 10*3/uL (ref 0.0–0.1)
BASOS PCT: 1 % (ref 0–1)
Basophils Absolute: 0 10*3/uL (ref 0.0–0.1)
Basophils Relative: 1 % (ref 0–1)
EOS PCT: 4 % (ref 0–5)
Eosinophils Absolute: 0.2 10*3/uL (ref 0.0–0.7)
Eosinophils Absolute: 0.1 10*3/uL (ref 0.0–0.7)
Eosinophils Relative: 4 % (ref 0–5)
HCT: 33.5 % — ABNORMAL LOW (ref 36.0–46.0)
HCT: 29.7 % — ABNORMAL LOW (ref 36.0–46.0)
Hemoglobin: 10.3 g/dL — ABNORMAL LOW (ref 12.0–15.0)
Hemoglobin: 9.1 g/dL — ABNORMAL LOW (ref 12.0–15.0)
LYMPHS PCT: 10 % — AB (ref 12–46)
Lymphocytes Relative: 10 % — ABNORMAL LOW (ref 12–46)
Lymphs Abs: 0.3 10*3/uL — ABNORMAL LOW (ref 0.7–4.0)
Lymphs Abs: 0.3 10*3/uL — ABNORMAL LOW (ref 0.7–4.0)
MCH: 32.3 pg (ref 26.0–34.0)
MCH: 31.6 pg (ref 26.0–34.0)
MCHC: 30.7 g/dL (ref 30.0–36.0)
MCHC: 30.6 g/dL (ref 30.0–36.0)
MCV: 105 fL — AB (ref 78.0–100.0)
MCV: 103.1 fL — ABNORMAL HIGH (ref 78.0–100.0)
MONO ABS: 0.6 10*3/uL (ref 0.1–1.0)
MONOS PCT: 23 % — AB (ref 3–12)
MONOS PCT: 21 % — AB (ref 3–12)
Monocytes Absolute: 0.8 10*3/uL (ref 0.1–1.0)
NEUTROS ABS: 2.1 10*3/uL (ref 1.7–7.7)
NEUTROS ABS: 1.9 10*3/uL (ref 1.7–7.7)
Neutrophils Relative %: 62 % (ref 43–77)
Neutrophils Relative %: 64 % (ref 43–77)
Platelets: 141 10*3/uL — ABNORMAL LOW (ref 150–400)
Platelets: 121 10*3/uL — ABNORMAL LOW (ref 150–400)
RBC: 3.19 MIL/uL — AB (ref 3.87–5.11)
RBC: 2.88 MIL/uL — AB (ref 3.87–5.11)
RDW: 20.7 % — AB (ref 11.5–15.5)
RDW: 20.5 % — AB (ref 11.5–15.5)
WBC: 3.4 10*3/uL — AB (ref 4.0–10.5)
WBC: 3 10*3/uL — ABNORMAL LOW (ref 4.0–10.5)

## 2015-02-04 LAB — I-STAT CHEM 8, ED
BUN: 35 mg/dL — AB (ref 6–20)
Calcium, Ion: 1.04 mmol/L — ABNORMAL LOW (ref 1.13–1.30)
Chloride: 107 mmol/L (ref 101–111)
Creatinine, Ser: 1.6 mg/dL — ABNORMAL HIGH (ref 0.44–1.00)
Glucose, Bld: 75 mg/dL (ref 65–99)
HEMATOCRIT: 33 % — AB (ref 36.0–46.0)
Hemoglobin: 11.2 g/dL — ABNORMAL LOW (ref 12.0–15.0)
POTASSIUM: 5.4 mmol/L — AB (ref 3.5–5.1)
Sodium: 138 mmol/L (ref 135–145)
TCO2: 25 mmol/L (ref 0–100)

## 2015-02-04 LAB — TROPONIN I
TROPONIN I: 0.05 ng/mL — AB (ref <0.031)
Troponin I: 0.04 ng/mL — ABNORMAL HIGH (ref <0.031)

## 2015-02-04 LAB — LACTIC ACID, PLASMA
Lactic Acid, Venous: 1.6 mmol/L (ref 0.5–2.0)
Lactic Acid, Venous: 1.6 mmol/L (ref 0.5–2.0)
Lactic Acid, Venous: 1.3 mmol/L (ref 0.5–2.0)

## 2015-02-04 LAB — TSH: TSH: 17.851 u[IU]/mL — ABNORMAL HIGH (ref 0.350–4.500)

## 2015-02-04 LAB — AMMONIA: Ammonia: 53 umol/L — ABNORMAL HIGH (ref 9–35)

## 2015-02-04 LAB — MAGNESIUM
Magnesium: 2.6 mg/dL — ABNORMAL HIGH (ref 1.7–2.4)
Magnesium: 2.6 mg/dL — ABNORMAL HIGH (ref 1.7–2.4)

## 2015-02-04 LAB — PROCALCITONIN

## 2015-02-04 LAB — BRAIN NATRIURETIC PEPTIDE: B NATRIURETIC PEPTIDE 5: 840 pg/mL — AB (ref 0.0–100.0)

## 2015-02-04 LAB — CBG MONITORING, ED: Glucose-Capillary: 88 mg/dL (ref 65–99)

## 2015-02-04 LAB — GLUCOSE, CAPILLARY: GLUCOSE-CAPILLARY: 73 mg/dL (ref 65–99)

## 2015-02-04 LAB — PROTIME-INR
INR: 2.65 — ABNORMAL HIGH (ref 0.00–1.49)
PROTHROMBIN TIME: 27.9 s — AB (ref 11.6–15.2)

## 2015-02-04 LAB — CORTISOL: Cortisol, Plasma: 11.2 ug/dL

## 2015-02-04 LAB — PHOSPHORUS: Phosphorus: 3.7 mg/dL (ref 2.5–4.6)

## 2015-02-04 MED ORDER — SODIUM CHLORIDE 0.9 % IV BOLUS (SEPSIS)
250.0000 mL | Freq: Once | INTRAVENOUS | Status: AC
  Administered 2015-02-04: 250 mL via INTRAVENOUS

## 2015-02-04 MED ORDER — SODIUM CHLORIDE 0.9 % IV SOLN
INTRAVENOUS | Status: DC
  Administered 2015-02-04: 14:00:00 via INTRAVENOUS

## 2015-02-04 MED ORDER — SODIUM CHLORIDE 0.9 % IV SOLN: 250.0000 mL | INTRAVENOUS | Status: DC | PRN

## 2015-02-04 MED ORDER — SODIUM POLYSTYRENE SULFONATE 15 GM/60ML PO SUSP
30.0000 g | Freq: Once | ORAL | Status: AC
  Administered 2015-02-05: 30 g
  Filled 2015-02-04: qty 120

## 2015-02-04 MED ORDER — LINEZOLID 600 MG/300ML IV SOLN
600.0000 mg | Freq: Two times a day (BID) | INTRAVENOUS | Status: DC
  Administered 2015-02-04 – 2015-02-06 (×4): 600 mg via INTRAVENOUS
  Filled 2015-02-04 (×5): qty 300

## 2015-02-04 MED ORDER — LEVOTHYROXINE SODIUM 25 MCG PO TABS
25.0000 ug | ORAL_TABLET | Freq: Every day | ORAL | Status: DC
  Administered 2015-02-05: 25 ug via ORAL
  Filled 2015-02-04 (×2): qty 1

## 2015-02-04 MED ORDER — SODIUM CHLORIDE 0.9 % IV SOLN
INTRAVENOUS | Status: DC
  Administered 2015-02-05: 03:00:00 via INTRAVENOUS

## 2015-02-04 MED ORDER — SODIUM CHLORIDE 0.9 % IV SOLN
2.0000 g | Freq: Once | INTRAVENOUS | Status: AC
  Administered 2015-02-04: 2 g via INTRAVENOUS
  Filled 2015-02-04: qty 20

## 2015-02-04 MED ORDER — APIXABAN 2.5 MG PO TABS
2.5000 mg | ORAL_TABLET | Freq: Two times a day (BID) | ORAL | Status: DC
  Administered 2015-02-05 – 2015-02-06 (×3): 2.5 mg via ORAL
  Filled 2015-02-04 (×5): qty 1

## 2015-02-04 MED ORDER — DEXTROSE 5 % IV SOLN
250.0000 mg | INTRAVENOUS | Status: DC
  Administered 2015-02-05 – 2015-02-06 (×2): 250 mg via INTRAVENOUS
  Filled 2015-02-04 (×3): qty 250

## 2015-02-04 MED ORDER — ROPINIROLE HCL 0.5 MG PO TABS
0.5000 mg | ORAL_TABLET | Freq: Every day | ORAL | Status: DC
  Filled 2015-02-04 (×2): qty 1

## 2015-02-04 MED ORDER — CETYLPYRIDINIUM CHLORIDE 0.05 % MT LIQD
7.0000 mL | Freq: Two times a day (BID) | OROMUCOSAL | Status: DC
  Administered 2015-02-05 – 2015-02-07 (×5): 7 mL via OROMUCOSAL

## 2015-02-04 MED ORDER — CHLORHEXIDINE GLUCONATE 0.12 % MT SOLN
15.0000 mL | Freq: Two times a day (BID) | OROMUCOSAL | Status: DC
  Administered 2015-02-05 – 2015-02-09 (×7): 15 mL via OROMUCOSAL
  Filled 2015-02-04 (×8): qty 15

## 2015-02-04 MED ORDER — DEXTROSE 5 % IV SOLN
1.0000 g | Freq: Once | INTRAVENOUS | Status: AC
  Administered 2015-02-04: 1 g via INTRAVENOUS
  Filled 2015-02-04: qty 10

## 2015-02-04 MED ORDER — LEVOFLOXACIN IN D5W 500 MG/100ML IV SOLN
500.0000 mg | Freq: Once | INTRAVENOUS | Status: AC
  Administered 2015-02-04: 500 mg via INTRAVENOUS
  Filled 2015-02-04: qty 100

## 2015-02-04 MED ORDER — INSULIN ASPART 100 UNIT/ML ~~LOC~~ SOLN
1.0000 [IU] | SUBCUTANEOUS | Status: DC
  Administered 2015-02-05: 1 [IU] via SUBCUTANEOUS
  Administered 2015-02-06: 2 [IU] via SUBCUTANEOUS

## 2015-02-04 MED ORDER — COLESEVELAM HCL 625 MG PO TABS
1875.0000 mg | ORAL_TABLET | Freq: Two times a day (BID) | ORAL | Status: DC
  Administered 2015-02-05 – 2015-02-19 (×30): 1875 mg via ORAL
  Filled 2015-02-04 (×37): qty 3

## 2015-02-04 MED ORDER — NOREPINEPHRINE BITARTRATE 1 MG/ML IV SOLN
0.0000 ug/min | INTRAVENOUS | Status: DC
  Administered 2015-02-04: 2 ug/min via INTRAVENOUS
  Filled 2015-02-04: qty 4

## 2015-02-04 MED ORDER — DEXTROSE 5 % IV SOLN
2.0000 g | INTRAVENOUS | Status: DC
  Administered 2015-02-04 – 2015-02-07 (×4): 2 g via INTRAVENOUS
  Filled 2015-02-04 (×5): qty 2

## 2015-02-04 MED ORDER — ICAPS PO CAPS: 2.0000 | ORAL_CAPSULE | Freq: Every day | ORAL | Status: DC

## 2015-02-04 MED ORDER — VITAMIN D3 25 MCG (1000 UNIT) PO TABS
5000.0000 [IU] | ORAL_TABLET | Freq: Every day | ORAL | Status: DC
  Administered 2015-02-05 – 2015-02-19 (×15): 5000 [IU] via ORAL
  Filled 2015-02-04 (×19): qty 5

## 2015-02-04 MED ORDER — PANTOPRAZOLE SODIUM 40 MG PO TBEC
40.0000 mg | DELAYED_RELEASE_TABLET | Freq: Every day | ORAL | Status: DC
  Administered 2015-02-05 – 2015-02-20 (×15): 40 mg via ORAL
  Filled 2015-02-04 (×15): qty 1

## 2015-02-04 MED ORDER — LINEZOLID 600 MG/300ML IV SOLN
600.0000 mg | Freq: Once | INTRAVENOUS | Status: DC
  Filled 2015-02-04: qty 300

## 2015-02-04 MED ORDER — PROSIGHT PO TABS
1.0000 | ORAL_TABLET | Freq: Every day | ORAL | Status: DC
  Administered 2015-02-05 – 2015-02-19 (×15): 1 via ORAL
  Filled 2015-02-04 (×18): qty 1

## 2015-02-04 MED ORDER — SODIUM CHLORIDE 0.9 % IV BOLUS (SEPSIS): 500.0000 mL | Freq: Once | INTRAVENOUS | Status: DC

## 2015-02-04 MED ORDER — GABAPENTIN 100 MG PO CAPS
100.0000 mg | ORAL_CAPSULE | Freq: Three times a day (TID) | ORAL | Status: DC
  Administered 2015-02-05: 100 mg via ORAL
  Filled 2015-02-04 (×4): qty 1

## 2015-02-04 NOTE — ED Notes (Signed)
Patient moved to trauma room 02

## 2015-02-04 NOTE — H&P (Signed)
PULMONARY / CRITICAL CARE MEDICINE HISTORY AND PHYSICAL EXAMINATION   Name: Melanie Camacho MRN: [id number] DOB: 02/01/1932    ADMISSION DATE:  02/07/2015  PRIMARY SERVICE: PCCM  CHIEF COMPLAINT:  AMS  BRIEF PATIENT DESCRIPTION: 58 F with CHF (EF 40-45%), possible small vessel CAD (clean cath 4/22), DMII who presented to [facility] as a transfer from a rehab center for AMS. Likely 2/2 acute delirium 2/2 UTI.   SIGNIFICANT EVENTS / STUDIES:  CT Head 5/28 --> No acute intracranial abnormality.   LINES / TUBES: PIV  CULTURES: Urine Culture 5/28 Blood Cultures x 2, 5/28  ANTIBIOTICS: Cefepime 5/28 - Linezolid 5/28 - (Vanc allergy) Levaquin x 1, 5/28 CTX x 1, 5/28  HISTORY OF PRESENT ILLNESS:  [name] is an 5 F with CHF (EF 40-45%), possible small vessel CAD (clean cath 4/22), DMII who was brought to [facility] as a transfer from [facility] on 5/28 for AMS. The patient is currently delirious and unable to provide any history. I was able to reach the patient's daughter, Marcelle Smiling at [phone] who confirms the basic story but is not privy to any additional information. I also discussed her situation with her daughter, Lynnda Shields [phone]) .   PAST MEDICAL HISTORY :  Past Medical History  Diagnosis Date  . Anginal pain   . Hypertension   . Heart murmur   . CHF (congestive heart failure)   . Shortness of breath   . Complication of anesthesia     "I had a slight reaction to the morphine given in surgery"  . High cholesterol   . Coronary artery disease   . Myocardial infarction 12/2013  . Pneumonia "once or twice"  . 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 Type II diabetes mellitus   . Anemia   . History of blood transfusion     "related to the anemia"  . GERD (gastroesophageal reflux disease)   . Arthritis     "all over"  . Chronic lower back pain    Past Surgical History  Procedure Laterality Date  . Left heart catheterization with coronary angiogram N/A 12/28/2013     Procedure: LEFT HEART CATHETERIZATION WITH CORONARY ANGIOGRAM;  Surgeon: Lennette Bihari, MD;  Location: [facility] CATH LAB;  Service: Cardiovascular;  Laterality: N/A;  . Cardiac catheterization    . Cholecystectomy open  1980's  . Appendectomy  ~ 1948  . Tonsillectomy  1954  . Hernia repair    . Abdominal hernia repair  "many times"  . Hemorrhoid banding    . Fracture surgery    . Foot fracture surgery Right ~ 1980  . Cataract extraction Right    Prior to Admission medications   Medication Sig Start Date End Date Taking? Authorizing Provider  acetaminophen (TYLENOL) 325 MG tablet Take 650 mg by mouth every 4 (four) hours as needed for mild pain.   Yes Historical Provider, MD  apixaban (ELIQUIS) 2.5 MG TABS tablet Take 1 tablet (2.5 mg total) by mouth 2 (two) times daily. 12/30/14  Yes Leroy Sea, MD  bisoprolol (ZEBETA) 5 MG tablet Take 2.5 mg by mouth daily.   Yes Historical Provider, MD  Cholecalciferol (VITAMIN D3) 5000 UNITS CAPS Take 5,000 Units by mouth daily.   Yes Historical Provider, MD  colesevelam (WELCHOL) 625 MG tablet Take 1,875 mg by mouth 2 (two) times daily with a meal.   Yes Historical Provider, MD  docusate sodium (COLACE) 100 MG capsule Take 1 capsule (  100 mg total) by mouth 2 (two) times daily. 12/27/14  Yes Kathlen Mody, MD  ferrous fumarate (HEMOCYTE - 106 MG FE) 325 (106 FE) MG TABS tablet Take 1 tablet by mouth daily.   Yes Historical Provider, MD  gabapentin (NEURONTIN) 100 MG capsule Take 1 capsule (100 mg total) by mouth 3 (three) times daily. 01/27/15  Yes Zannie Cove, MD  levothyroxine (SYNTHROID, LEVOTHROID) 25 MCG tablet Take 1 tablet (25 mcg total) by mouth daily before breakfast. 12/27/14  Yes Kathlen Mody, MD  Multiple Vitamins-Minerals (ICAPS) CAPS Take 2 capsules by mouth daily.    Yes Historical Provider, MD  Nutritional Supplements (RA MELATONIN/B-6 PO) Take 6 mg by mouth at bedtime.   Yes Historical Provider, MD  pantoprazole (PROTONIX) 40 MG tablet  Take 1 tablet (40 mg total) by mouth at bedtime. 12/27/14  Yes Kathlen Mody, MD  potassium chloride SA (K-DUR,KLOR-CON) 20 MEQ tablet Take 2 tablets (40 mEq total) by mouth daily. Patient taking differently: Take 40 mEq by mouth 2 (two) times daily.  12/27/14  Yes Kathlen Mody, MD  rOPINIRole (REQUIP) 0.5 MG tablet Take 0.5 mg by mouth at bedtime.   Yes Historical Provider, MD  torsemide (DEMADEX) 10 MG tablet Take 30 mg by mouth 2 (two) times daily.   Yes Historical Provider, MD  bisoprolol (ZEBETA) 5 MG tablet Take 1 tablet (5 mg total) by mouth daily. Patient taking differently: Take 10 mg by mouth daily.  12/29/13   Rhonda G Barrett, PA-C  hydrocortisone (ANUSOL-HC) 2.5 % rectal cream Place rectally 3 (three) times daily. Patient taking differently: Place 1 application rectally 3 (three) times daily as needed for hemorrhoids.  01/27/15   Zannie Cove, MD  lactulose (CHRONULAC) 10 GM/15ML solution Take 15 mLs (10 g total) by mouth 2 (two) times daily. 01/27/15   Zannie Cove, MD  oxycodone (OXY-IR) 5 MG capsule Take 1 capsule (5 mg total) by mouth every 6 (six) hours as needed for pain. 12/27/14   Kathlen Mody, MD  rOPINIRole (REQUIP) 0.25 MG tablet Take 1 tablet (0.25 mg total) by mouth at bedtime. 01/27/15   Zannie Cove, MD  torsemide (DEMADEX) 20 MG tablet Take 1 tablet (20 mg total) by mouth 2 (two) times daily. 01/27/15   Zannie Cove, MD   Allergies  Allergen Reactions  . Morphine And Related     Stevens-Johnsons  . Other     Lincomycin  . Tape   . Bacitracin Rash  . Betadine [Povidone Iodine] Rash  . Clindamycin/Lincomycin Rash  . Vancomycin Rash    FAMILY HISTORY:  Family History  Problem Relation Age of Onset  . Hypertension Mother    SOCIAL HISTORY:  reports that she has never smoked. She has never used smokeless tobacco. She reports that she does not drink alcohol or use illicit drugs.  REVIEW OF SYSTEMS:  Unable to obtain secondary to patient  condition.  SUBJECTIVE:   VITAL SIGNS: Temp:  [91.7 F (33.2 C)-93.4 F (34.1 C)] 93.4 F (34.1 C) (05/28 1900) Pulse Rate:  [39-73] 70 (05/28 1900) Resp:  [9-21] 9 (05/28 1900) BP: (70-104)/(27-74) 104/27 mmHg (05/28 1900) SpO2:  [94 %-100 %] 100 % (05/28 1900) HEMODYNAMICS:   VENTILATOR SETTINGS:   INTAKE / OUTPUT: Intake/Output    None     PHYSICAL EXAMINATION: General:  Elderly F in NAD Neuro:  Responds transiently to name but never answers questions  HEENT:  Sclera anicteric, conjunctiva pink, MMM, Dentures present Neck: Trachea supple and midline, (-) LAN  or JVD Cardiovascular:  Irr Rate/Rhythem, NS1/S2, (-) MRG Lungs:  Limited exam 2/2 patient positioning, no gross crackles or rales Abdomen:  Obese, NT/ND/(+)BS Musculoskeletal:  2+ edema to knees bilaterally, LE wrapped Skin:  Healing shingles rash on R buttock/RLE  LABS:  CBC  Recent Labs Lab 01/30/15 0630 02/01/15 0710 02/06/2015 1318 02/07/2015 1329  WBC 2.9* 2.6* 3.4*  --   HGB 9.9* 10.5* 10.3* 11.2*  HCT 32.1* 34.4* 33.5* 33.0*  PLT 181 171 141*  --    Coag's No results for input(s): APTT, INR in the last 168 hours. BMET  Recent Labs Lab 01/30/15 0630 02/01/15 0710 01/10/2015 1318 01/16/2015 1329  NA 139 140 139 138  K 3.2* 3.7 5.4* 5.4*  CL 91* 94* 103 107  CO2 37* 35* 30  --   BUN 33* 36* 41* 35*  CREATININE 1.22* 1.30* 1.37* 1.60*  GLUCOSE 75 79 76 75   Electrolytes  Recent Labs Lab 01/30/15 0630 02/01/15 0710 01/15/2015 1318 01/12/2015 1320  CALCIUM 8.5* 8.6* 8.9  --   MG  --   --   --  2.6*   Sepsis Markers  Recent Labs Lab 01/14/2015 1318 01/16/2015 1540  LATICACIDVEN 1.6 1.6   ABG No results for input(s): PHART, PCO2ART, PO2ART in the last 168 hours. Liver Enzymes  Recent Labs Lab 01/21/2015 1318  AST 34  ALT 16  ALKPHOS 74  BILITOT 1.1  ALBUMIN 3.2*   Cardiac Enzymes  Recent Labs Lab 01/13/2015 1318  TROPONINI 0.04*   Glucose  Recent Labs Lab 01/30/2015 1426  02/03/2015 1856  GLUCAP 88 73    Imaging Ct Head Wo Contrast  01/11/2015   CLINICAL DATA:  Lethargy.  Incontinence.  EXAM: CT HEAD WITHOUT CONTRAST  TECHNIQUE: Contiguous axial images were obtained from the base of the skull through the vertex without intravenous contrast.  COMPARISON:  01/12/2015  FINDINGS: Prominence of the sulci and ventricles noted consistent with age related brain atrophy. No evidence for acute brain infarct, intracranial hemorrhage or mass. No abnormal extra-axial fluid collections. Cranial exostosis overlying the left frontal lobe is again noted. The paranasal sinuses and mastoid air cells are clear. The calvarium is intact.  IMPRESSION: 1. No acute intracranial abnormality. 2. Age related brain atrophy.   Electronically Signed   By: Signa Kell M.D.   On: 01/16/2015 13:53   Dg Chest Portable 1 View  01/28/2015   CLINICAL DATA:  Altered mental status.  EXAM: PORTABLE CHEST - 1 VIEW  COMPARISON:  01/17/2015  FINDINGS: Persistent atelectasis versus infiltrate involving the right lower lung zone with associated probable right pleural effusion. There is a small left pleural effusion. No overt pulmonary edema identified. Interval removal of PICC line. Stable cardiac enlargement.  IMPRESSION: Persistent atelectasis versus infiltrate involving the right lower lung with associated right pleural effusion. Smaller left pleural effusion present.   Electronically Signed   By: Irish Lack M.D.   On: 01/25/2015 12:46    EKG: Reviewed, very poor tracing with Afib CXR: Reviewed. Limited exam. Clear CM with possible edema. Bilateral R>L effusions.  ASSESSMENT / PLAN:  Active Problems:   * No active hospital problems. *   PULMONARY A: Acute hypoxic respiratory failure: Now satting 100% on 3 L. Likely 2/2 Acute on chronic HF with possible pneumonia.  P:   Supplemental O2 to maintain Sats >= 92%  CARDIOVASCULAR A: Severe Sepsis: At this point I believe severe sepsis is the most  likely etiology for the multiple issues Ms.  Delph is facing. She appears mildly volume up on exam but her hypotension is concerning and will require IVF. She will most likely require a central line tonight if the decision is to pursue aggressive care. I discussed this with her daughters and the decision is to use pressors through a peripheral line only now and only consider a central line if neccessary. Acute on chronic HF: AFib: Mechanical AV: HL: P:   IVF Pressors to maintain MAP >= 60 (allowing lower MAP given baseline hypotension) Check cortisol Serial lactates and troponins Consider central line as above; if place will check SvO2 Hold BB blockers Hold diuretics Continue Eloquis for now  RENAL A: Acute on Chronic KD: Almost certainly 2/2 sepsis.  Hyperkalemia: Hypocalemia: P:   Serial BMPs Replete Ca  GASTROINTESTINAL A: GERD:  P:   Cont PPI:  HEMATOLOGIC A: Leukopenia: Chronic issue Thrombocytopenia: Chronic issue P:   Monitor  INFECTIOUS A: Possible Pneumonia, Possible UTI:  P:   Linezolid and Cefepime Follow cultures  ENDOCRINE A: DMII:  Hypothyroidism:  P:   SSI Check TSH Continue Levothyroxine (will likely need NG tube to get it)  NEUROLOGIC A: AMS: Almost certainly delirium P:   Monitr  BEST PRACTICE / DISPOSITION Level of Care:  ICU Primary Service:  PCCM Consultants:  Consider cards consult though none called on evening of 5/28 Code Status:  Full for now, family considering Diet:  NPO DVT Px:  On AC GI Px:  PPI Skin Integrity:  As above, wound consult for LEs Social / Family:  Updated daughters, they are discussing GOC  TODAY'S SUMMARY:   I have personally obtained a history, examined the patient, evaluated laboratory and imaging results, formulated the assessment and plan and placed orders.  CRITICAL CARE: The patient is critically ill with multiple organ systems failure and requires high complexity decision making for assessment  and support, frequent evaluation and titration of therapies, application of advanced monitoring technologies and extensive interpretation of multiple databases. Critical Care Time devoted to patient care services described in this note is 75 minutes.   Evalyn Casco, MD Pulmonary and Critical Care Medicine [facility] Pager: [phone]   01/17/2015, 7:26 PM

## 2015-02-04 NOTE — ED Notes (Signed)
Melanie Camacho, daughter [phone]

## 2015-02-04 NOTE — ED Notes (Signed)
MD at bedside. 

## 2015-02-04 NOTE — ED Notes (Signed)
[facility] staff reports she noticed that pt was lethargic this morning and had been incontinent of urine twice.  Reports pt usually continent of urine and alert and oriented.  Reports earlier this morning, pt's vs were wnl.  Around 11AM, nurse checked on pt and bp was 90/s systolic, 02 sat low 90s and pt more lethargic than she was this morning.  Pt confused, unable to state where she is.  Pt responds to verbal stimuli but only saying yes and no and moaning.  Pt answers yes when asked about pain but unable to state where.  Pt resting, eyes closed.  NT reports pt's urine smells very strong this morning.  Pt also has dressings to both lower legs that were changed this morning per staff.

## 2015-02-04 NOTE — ED Notes (Signed)
SBP 70's, IVF started Mcmanus made aware.

## 2015-02-04 NOTE — Progress Notes (Signed)
eLink Physician-Brief Progress Note Patient Name: Melanie Camacho DOB: 1931/12/22 MRN: [id number]   Date of Service  01/25/2015  HPI/Events of Note  K+ = 5.7.   eICU Interventions  Will place NGT and give Kayexalate 30 gm X 1. Recheck BMP in AM.      Intervention Category Major Interventions: Electrolyte abnormality - evaluation and management  Sommer,Steven Eugene 01/29/2015, 11:03 PM

## 2015-02-04 NOTE — Progress Notes (Signed)
ANTIBIOTIC CONSULT NOTE - INITIAL  Pharmacy Consult for cefepime, azith, zyvox Indication: sepsis  Allergies  Allergen Reactions  . Morphine And Related     Stevens-Johnsons  . Other     Lincomycin  . Tape   . Bacitracin Rash  . Betadine [Povidone Iodine] Rash  . Clindamycin/Lincomycin Rash  . Vancomycin Rash    Patient Measurements:    Weight: 95.3 kg  Vital Signs: Temp: 93.4 F (34.1 C) (05/28 1900) Temp Source: Axillary (05/28 1900) BP: 66/29 mmHg (05/28 1930) Pulse Rate: 32 (05/28 1930) Intake/Output from previous day:   Intake/Output from this shift:    Labs:  Recent Labs  01/31/2015 1318 02/06/2015 1329  WBC 3.4*  --   HGB 10.3* 11.2*  PLT 141*  --   CREATININE 1.37* 1.60*   Estimated Creatinine Clearance: 29.6 mL/min (by C-G formula based on Cr of 1.6). No results for input(s): VANCOTROUGH, VANCOPEAK, VANCORANDOM, GENTTROUGH, GENTPEAK, GENTRANDOM, TOBRATROUGH, TOBRAPEAK, TOBRARND, AMIKACINPEAK, AMIKACINTROU, AMIKACIN in the last 72 hours.   Microbiology: Recent Results (from the past 720 hour(s))  Culture, Urine     Status: None   Collection Time: 01/07/15 12:30 AM  Result Value Ref Range Status   Specimen Description URINE, CATHETERIZED  Final   Special Requests n/a Immunocompromised  Final   Colony Count NO GROWTH Performed at [facility]   Final   Culture NO GROWTH Performed at [facility]   Final   Report Status 01/08/2015 FINAL  Final  Clostridium Difficile by PCR     Status: None   Collection Time: 01/12/15  4:44 PM  Result Value Ref Range Status   C difficile by pcr NEGATIVE NEGATIVE Final  MRSA PCR Screening     Status: None   Collection Time: 01/25/15  5:41 PM  Result Value Ref Range Status   MRSA by PCR NEGATIVE NEGATIVE Final    Comment:        The GeneXpert MRSA Assay (FDA approved for NASAL specimens only), is one component of a comprehensive MRSA colonization surveillance program. It is not intended to  diagnose MRSA infection nor to guide or monitor treatment for MRSA infections.   Blood culture (routine x 2)     Status: None (Preliminary result)   Collection Time: 01/31/2015  1:18 PM  Result Value Ref Range Status   Specimen Description LEFT ANTECUBITAL  Final   Special Requests BOTTLES DRAWN AEROBIC ONLY 8CC  Final   Culture PENDING  Incomplete   Report Status PENDING  Incomplete    Medical History: Past Medical History  Diagnosis Date  . Anginal pain   . Hypertension   . Heart murmur   . CHF (congestive heart failure)   . Shortness of breath   . Complication of anesthesia     "I had a slight reaction to the morphine given in surgery"  . High cholesterol   . Coronary artery disease   . Myocardial infarction 12/2013  . Pneumonia "once or twice"  . 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 Type II diabetes mellitus   . Anemia   . History of blood transfusion     "related to the anemia"  . GERD (gastroesophageal reflux disease)   . Arthritis     "all over"  . Chronic lower back pain     Medications:  Prescriptions prior to admission  Medication Sig Dispense Refill Last Dose  . acetaminophen (TYLENOL) 325 MG tablet Take 650 mg  by mouth every 4 (four) hours as needed for mild pain.   Unknown  . apixaban (ELIQUIS) 2.5 MG TABS tablet Take 1 tablet (2.5 mg total) by mouth 2 (two) times daily.   02/16/2015 at 09:00  . bisoprolol (ZEBETA) 5 MG tablet Take 2.5 mg by mouth daily.   February 21, 2015 at 09:00  . Cholecalciferol (VITAMIN D3) 5000 UNITS CAPS Take 5,000 Units by mouth daily.   03/05/2015  . colesevelam (WELCHOL) 625 MG tablet Take 1,875 mg by mouth 2 (two) times daily with a meal.   02/23/2015  . docusate sodium (COLACE) 100 MG capsule Take 1 capsule (100 mg total) by mouth 2 (two) times daily. 10 capsule 0 02/13/2015  . ferrous fumarate (HEMOCYTE - 106 MG FE) 325 (106 FE) MG TABS tablet Take 1 tablet by mouth daily.   02/22/2015  . gabapentin (NEURONTIN) 100 MG capsule  Take 1 capsule (100 mg total) by mouth 3 (three) times daily.   03/09/2015  . levothyroxine (SYNTHROID, LEVOTHROID) 25 MCG tablet Take 1 tablet (25 mcg total) by mouth daily before breakfast.   Feb 21, 2015  . Multiple Vitamins-Minerals (ICAPS) CAPS Take 2 capsules by mouth daily.    02/23/2015  . Nutritional Supplements (RA MELATONIN/B-6 PO) Take 6 mg by mouth at bedtime.   02/03/2015  . pantoprazole (PROTONIX) 40 MG tablet Take 1 tablet (40 mg total) by mouth at bedtime.   02/03/2015  . potassium chloride SA (K-DUR,KLOR-CON) 20 MEQ tablet Take 2 tablets (40 mEq total) by mouth daily. (Patient taking differently: Take 40 mEq by mouth 2 (two) times daily. ) 30 tablet 0 02/19/2015  . rOPINIRole (REQUIP) 0.5 MG tablet Take 0.5 mg by mouth at bedtime.   02/03/2015  . torsemide (DEMADEX) 10 MG tablet Take 30 mg by mouth 2 (two) times daily.   Feb 21, 2015  . bisoprolol (ZEBETA) 5 MG tablet Take 1 tablet (5 mg total) by mouth daily. (Patient taking differently: Take 10 mg by mouth daily. ) 30 tablet 11 01/04/2015 at Unknown time  . hydrocortisone (ANUSOL-HC) 2.5 % rectal cream Place rectally 3 (three) times daily. (Patient taking differently: Place 1 application rectally 3 (three) times daily as needed for hemorrhoids. ) 30 g 0 Unknown  . lactulose (CHRONULAC) 10 GM/15ML solution Take 15 mLs (10 g total) by mouth 2 (two) times daily.  0   . oxycodone (OXY-IR) 5 MG capsule Take 1 capsule (5 mg total) by mouth every 6 (six) hours as needed for pain. 10 capsule 0 01/03/2015  . rOPINIRole (REQUIP) 0.25 MG tablet Take 1 tablet (0.25 mg total) by mouth at bedtime.     . torsemide (DEMADEX) 20 MG tablet Take 1 tablet (20 mg total) by mouth 2 (two) times daily.      Assessment: 81 yo F transferred from [facility] from a rehab center.  Pharmacy consulted to dose cefepime and adjust abxs as needed for renal function. Pt with AMS. Likely acute delerium 2nd UTI.  Zyvox ordered due to pt's allergy to vancomycin (rash).   Wt 95.3 kg, WBC  low at 3.4, creat 1.6, creat cl ~ 42 ml/min.  Temp low 91.7 and 93.4.  LA 1.6; QTC on ECG 698 in pt with afib.  D/w Dr. Wyatt Portela in black box about levaquin and azithromycin and duplicate risk of prolonging QT interval.  Will defer start time of azithromycin to 5/29 due to this concern.  5/28 CXR persistent atelectasis vs infiltrate involving RLL w/ assoc R pleural effusion, smaller L pleural effusion present  5/28 head CT: neg  Ceftriaxone 1 gm x 1 dose 5/28 at 1458 levaquin 500 mg IV x 1 dose 5/28 at 1710.   Cefepime 5/28>> zyvox 5/28>> azith 5/29>>  5/28 Ucx>> 5/28 BCx2>>  Goal of Therapy:  Eradicate infection  Plan:  -zyvox 600 mg IV q12h -cefepime 2 gm IV q24 -azithromycin 500mg  IV q24 - to start 5/29 at 1800 to space 24 hours after levaquin to avoid duplicate qt prolonging agents -f/u renal fxn, wbc, temp, culture data  Herby Abraham, Pharm.D. [phone] 8:59 PM

## 2015-02-04 NOTE — ED Provider Notes (Signed)
CSN: [id number]     Arrival date & time 01/19/2015  1144 History   First MD Initiated Contact with Patient 01/14/2015 1235     Chief Complaint  Patient presents with  . Altered Mental Status     (Consider location/radiation/quality/duration/timing/severity/associated sxs/prior Treatment) HPI Comments: LEVEL 5 CAVEAT.  The patient is an 81 year old female who presents to the emergency department by EMS from a local rehabilitation center with complaint of altered mental status.  The patient is poorly responsive, and the majority of the history is obtained from the patient's son, the nursing home personnel, and EMS. The patient's son states that on yesterday he was able to carry on a conversation with Mrs. Ehmann. He states that she gets confused easily with a history of some dementia, but she was able to carry on a conversation. Today the nursing staff states that she is very slow to respond, and when she does respond she usually grunts or shakes her head. They deny any high fever. His been no known hemoptysis. The patient's blood pressures have been low, but the nursing facility states that she usually runs a lower blood pressure. His been no unusual rash. It is of note that the patient was discharged from the hospital on May 20, and has been at the rehabilitation facility since that time.  The history is provided by a relative, the EMS personnel and the nursing home.    Past Medical History  Diagnosis Date  . Anginal pain   . Hypertension   . Heart murmur   . CHF (congestive heart failure)   . Shortness of breath   . Complication of anesthesia     "I had a slight reaction to the morphine given in surgery"  . High cholesterol   . Coronary artery disease   . Myocardial infarction 12/2013  . Pneumonia "once or twice"  . 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 Type II diabetes mellitus   . Anemia   . History of blood transfusion     "related to the anemia"  . GERD (gastroesophageal  reflux disease)   . Arthritis     "all over"  . Chronic lower back pain    Past Surgical History  Procedure Laterality Date  . Left heart catheterization with coronary angiogram N/A 12/28/2013    Procedure: LEFT HEART CATHETERIZATION WITH CORONARY ANGIOGRAM;  Surgeon: Lennette Bihari, MD;  Location: [facility] CATH LAB;  Service: Cardiovascular;  Laterality: N/A;  . Cardiac catheterization    . Cholecystectomy open  1980's  . Appendectomy  ~ 1948  . Tonsillectomy  1954  . Hernia repair    . Abdominal hernia repair  "many times"  . Hemorrhoid banding    . Fracture surgery    . Foot fracture surgery Right ~ 1980  . Cataract extraction Right    Family History  Problem Relation Age of Onset  . Hypertension Mother    History  Substance Use Topics  . Smoking status: Never Smoker   . Smokeless tobacco: Never Used  . Alcohol Use: No   OB History    No data available     Review of Systems  Unable to perform ROS: Mental status change      Allergies  Morphine and related; Other; Tape; Bacitracin; Betadine; Clindamycin/lincomycin; and Vancomycin  Home Medications   Prior to Admission medications   Medication Sig Start Date End Date Taking? Authorizing Provider  apixaban (ELIQUIS) 2.5 MG TABS tablet  Take 1 tablet (2.5 mg total) by mouth 2 (two) times daily. 12/30/14   Leroy Sea, MD  bisoprolol (ZEBETA) 5 MG tablet Take 1 tablet (5 mg total) by mouth daily. Patient taking differently: Take 10 mg by mouth daily.  12/29/13   Rhonda G Barrett, PA-C  Cholecalciferol (VITAMIN D3) 5000 UNITS CAPS Take 5,000 Units by mouth daily.    Historical Provider, MD  colesevelam (WELCHOL) 625 MG tablet Take 1,875 mg by mouth 2 (two) times daily with a meal.    Historical Provider, MD  docusate sodium (COLACE) 100 MG capsule Take 1 capsule (100 mg total) by mouth 2 (two) times daily. 12/27/14   Kathlen Mody, MD  ferrous fumarate (HEMOCYTE - 106 MG FE) 325 (106 FE) MG TABS tablet Take 1 tablet by mouth  daily.    Historical Provider, MD  gabapentin (NEURONTIN) 100 MG capsule Take 1 capsule (100 mg total) by mouth 3 (three) times daily. 01/27/15   Zannie Cove, MD  hydrocortisone (ANUSOL-HC) 2.5 % rectal cream Place rectally 3 (three) times daily. 01/27/15   Zannie Cove, MD  lactulose (CHRONULAC) 10 GM/15ML solution Take 15 mLs (10 g total) by mouth 2 (two) times daily. 01/27/15   Zannie Cove, MD  levothyroxine (SYNTHROID, LEVOTHROID) 25 MCG tablet Take 1 tablet (25 mcg total) by mouth daily before breakfast. 12/27/14   Kathlen Mody, MD  Multiple Vitamins-Minerals (ICAPS) CAPS Take 2 capsules by mouth daily.     Historical Provider, MD  oxycodone (OXY-IR) 5 MG capsule Take 1 capsule (5 mg total) by mouth every 6 (six) hours as needed for pain. 12/27/14   Kathlen Mody, MD  pantoprazole (PROTONIX) 40 MG tablet Take 1 tablet (40 mg total) by mouth at bedtime. 12/27/14   Kathlen Mody, MD  potassium chloride SA (K-DUR,KLOR-CON) 20 MEQ tablet Take 2 tablets (40 mEq total) by mouth daily. 12/27/14   Kathlen Mody, MD  rOPINIRole (REQUIP) 0.25 MG tablet Take 1 tablet (0.25 mg total) by mouth at bedtime. 01/27/15   Zannie Cove, MD  torsemide (DEMADEX) 20 MG tablet Take 1 tablet (20 mg total) by mouth 2 (two) times daily. 01/27/15   Zannie Cove, MD   There were no vitals taken for this visit. Physical Exam  Constitutional: She appears well-developed and well-nourished. She has a sickly appearance.  HENT:  Head: Normocephalic and atraumatic.  Mouth/Throat: No oropharyngeal exudate.  Eyes: Pupils are equal, round, and reactive to light. Right conjunctiva is injected. Left conjunctiva is injected. No scleral icterus.  Neck: Trachea normal. No JVD present. No rigidity. No thyroid mass present.  Cardiovascular: Intact distal pulses.  An irregular rhythm present. Bradycardia present.   Murmur heard.  Systolic murmur is present with a grade of 2/6  Pulmonary/Chest: Effort normal. No accessory muscle usage.  No respiratory distress. She has no wheezes. She has no rales.  Abdominal: Soft. Bowel sounds are normal. She exhibits no distension.  Musculoskeletal:  Blisters and ulcers noted of the right and left lower extremity. No red streaks noted. Lower extremities are warm but not hot.UNA boot in place.  Neurological:  Poorly responsive, will respond to pain stimuli. Gag reflex intact.  Skin: Skin is warm and dry. She is not diaphoretic.    ED Course  Case discussed with Pt son in room and daughter who is RN on the phone.  2:27pm Temp 91.7 rectal. Bair Hugger ordered.  Procedures (including critical care time)  CRITICAL CARE Performed by: Kathie Dike Total critical care time: *50  min** Critical care time was exclusive of separately billable procedures and treating other patients. Critical care was necessary to treat or prevent imminent or life-threatening deterioration. Critical care was time spent personally by me on the following activities: development of treatment plan with patient and/or surrogate as well as nursing, discussions with consultants, evaluation of patient's response to treatment, examination of patient, obtaining history from patient or surrogate, ordering and performing treatments and interventions, ordering and review of laboratory studies, ordering and review of radiographic studies, pulse oximetry and re-evaluation of patient's condition. Labs Review Labs Reviewed  URINALYSIS, ROUTINE W REFLEX MICROSCOPIC (NOT AT [facility])  AMMONIA  COMPREHENSIVE METABOLIC PANEL  BRAIN NATRIURETIC PEPTIDE  TROPONIN I  LACTIC ACID, PLASMA  LACTIC ACID, PLASMA  CBC WITH DIFFERENTIAL/PLATELET    Imaging Review No results found.   EKG Interpretation None      MDM  Pt's heart rate ranging from 40 to 68. Pt slow to respond. EKG - No acute Pt moved to closer monitor room. CBG wnl at 88. Ammonia  Elevated at 53. B Nat. Peptide 840 elevated. Comprehensive metabolic panel shows  potassium to be elevated at 5.4, the BUN 41 and creatinine 1.37. Question mild dehydration. IV fluids given gently due to patient's multiple medical conditions. Liver function studies within normal limits. The B natruretic peptide is elevated at 840. Lactic acid is normal at 1.6. The complete blood count reveals a WBC low at 3.4, hemoglobin low at 10.3, hematocrit low at 33.5, review of previous emergency department and hospital visits revealed that the patient has a chronic anemia. Platelets are low at 141,000.   CT scan of the head reveals no acute intracranial abnormality. Chest x-ray showed a persistent atelectasis versus infiltrate involving the right lower lung, with associated right pleural effusion.  Urine culture obtained and sent to the lab.   Patient treated with Rocephin.   Patient more awake and alert after temperature improved. The results of the test and exam findings a been discussed with the patient's son and daughter. Questions answered. They are in agreement for admission.  Case discussed with hospitalist, it was suggested that the patient be discussed with the intensivist. Case discussed with Dr. Levada Schilling, he will have the patient admitted at the [facility].    Final diagnoses:  Altered mental status  Urinary tract infection without hematuria, site unspecified    *I have reviewed nursing notes, vital signs, and all appropriate lab and imaging results for this patient.**    Ivery Quale, PA-C 01/08/2015 1741  Samuel Jester, DO 02/06/15 [phone]

## 2015-02-04 NOTE — ED Notes (Signed)
HR 40's, Clarene Duke made aware.

## 2015-02-05 ENCOUNTER — Inpatient Hospital Stay (HOSPITAL_COMMUNITY): Payer: Medicare Other

## 2015-02-05 DIAGNOSIS — N390 Urinary tract infection, site not specified: Secondary | ICD-10-CM

## 2015-02-05 DIAGNOSIS — R6521 Severe sepsis with septic shock: Secondary | ICD-10-CM

## 2015-02-05 DIAGNOSIS — G9340 Encephalopathy, unspecified: Secondary | ICD-10-CM

## 2015-02-05 DIAGNOSIS — A419 Sepsis, unspecified organism: Principal | ICD-10-CM

## 2015-02-05 LAB — GLUCOSE, CAPILLARY
GLUCOSE-CAPILLARY: 110 mg/dL — AB (ref 65–99)
GLUCOSE-CAPILLARY: 75 mg/dL (ref 65–99)
Glucose-Capillary: 69 mg/dL (ref 65–99)
Glucose-Capillary: 84 mg/dL (ref 65–99)
Glucose-Capillary: 96 mg/dL (ref 65–99)
Glucose-Capillary: 103 mg/dL — ABNORMAL HIGH (ref 65–99)
Glucose-Capillary: 121 mg/dL — ABNORMAL HIGH (ref 65–99)

## 2015-02-05 LAB — BASIC METABOLIC PANEL
Anion gap: 9 (ref 5–15)
BUN: 38 mg/dL — ABNORMAL HIGH (ref 6–20)
CALCIUM: 8.8 mg/dL — AB (ref 8.9–10.3)
CO2: 27 mmol/L (ref 22–32)
CREATININE: 1.51 mg/dL — AB (ref 0.44–1.00)
Chloride: 105 mmol/L (ref 101–111)
GFR calc non Af Amer: 31 mL/min — ABNORMAL LOW (ref >60)
GFR, EST AFRICAN AMERICAN: 36 mL/min — AB (ref >60)
Glucose, Bld: 90 mg/dL (ref 65–99)
POTASSIUM: 5.7 mmol/L — AB (ref 3.5–5.1)
SODIUM: 141 mmol/L (ref 135–145)

## 2015-02-05 LAB — LIPASE, BLOOD: Lipase: 13 U/L — ABNORMAL LOW (ref 22–51)

## 2015-02-05 LAB — BLOOD GAS, ARTERIAL
ACID-BASE EXCESS: 4 mmol/L — AB (ref 0.0–2.0)
Bicarbonate: 27.4 mEq/L — ABNORMAL HIGH (ref 20.0–24.0)
Drawn by: 236041
FIO2: 0.21 %
O2 Saturation: 88.9 %
Patient temperature: 98.6
TCO2: 28.6 mmol/L (ref 0–100)
pCO2 arterial: 37.3 mmHg (ref 35.0–45.0)
pH, Arterial: 7.48 — ABNORMAL HIGH (ref 7.350–7.450)
pO2, Arterial: 56.1 mmHg — ABNORMAL LOW (ref 80.0–100.0)

## 2015-02-05 LAB — CBC
HEMATOCRIT: 31.1 % — AB (ref 36.0–46.0)
HEMOGLOBIN: 9.6 g/dL — AB (ref 12.0–15.0)
MCH: 31.8 pg (ref 26.0–34.0)
MCHC: 30.9 g/dL (ref 30.0–36.0)
MCV: 103 fL — ABNORMAL HIGH (ref 78.0–100.0)
Platelets: 131 10*3/uL — ABNORMAL LOW (ref 150–400)
RBC: 3.02 MIL/uL — ABNORMAL LOW (ref 3.87–5.11)
RDW: 20.7 % — AB (ref 11.5–15.5)
WBC: 3.6 10*3/uL — ABNORMAL LOW (ref 4.0–10.5)

## 2015-02-05 LAB — PROCALCITONIN: Procalcitonin: <0.1 ng/mL

## 2015-02-05 LAB — MAGNESIUM: Magnesium: 2.4 mg/dL (ref 1.7–2.4)

## 2015-02-05 LAB — TROPONIN I
TROPONIN I: 0.04 ng/mL — AB (ref <0.031)
TROPONIN I: 0.06 ng/mL — AB (ref <0.031)

## 2015-02-05 LAB — LACTIC ACID, PLASMA
LACTIC ACID, VENOUS: 2.2 mmol/L — AB (ref 0.5–2.0)
Lactic Acid, Venous: 2 mmol/L (ref 0.5–2.0)

## 2015-02-05 LAB — AMYLASE: Amylase: 22 U/L — ABNORMAL LOW (ref 28–100)

## 2015-02-05 LAB — PHOSPHORUS: Phosphorus: 3.6 mg/dL (ref 2.5–4.6)

## 2015-02-05 MED ORDER — LEVOTHYROXINE SODIUM 50 MCG PO TABS
50.0000 ug | ORAL_TABLET | Freq: Every day | ORAL | Status: DC
  Administered 2015-02-06 – 2015-02-14 (×9): 50 ug via ORAL
  Filled 2015-02-05 (×10): qty 1

## 2015-02-05 MED ORDER — IBUPROFEN 100 MG/5ML PO SUSP
400.0000 mg | Freq: Four times a day (QID) | ORAL | Status: DC | PRN
  Administered 2015-02-09 – 2015-02-11 (×2): 400 mg
  Filled 2015-02-05 (×7): qty 20

## 2015-02-05 MED ORDER — FENTANYL CITRATE (PF) 100 MCG/2ML IJ SOLN
50.0000 ug | Freq: Once | INTRAMUSCULAR | Status: AC
  Administered 2015-02-06: 50 ug via INTRAVENOUS
  Filled 2015-02-05: qty 2

## 2015-02-05 MED ORDER — DEXTROSE 50 % IV SOLN
INTRAVENOUS | Status: AC
  Administered 2015-02-05: 25 mL via INTRAVENOUS
  Filled 2015-02-05: qty 50

## 2015-02-05 MED ORDER — DEXTROSE-NACL 5-0.9 % IV SOLN
INTRAVENOUS | Status: DC
  Administered 2015-02-05: 04:00:00 via INTRAVENOUS
  Administered 2015-02-05 – 2015-02-06 (×2): 75 mL/h via INTRAVENOUS

## 2015-02-05 MED ORDER — DEXTROSE 50 % IV SOLN
25.0000 mL | Freq: Once | INTRAVENOUS | Status: AC
  Administered 2015-02-05: 25 mL via INTRAVENOUS

## 2015-02-05 NOTE — Progress Notes (Signed)
eLink Physician-Brief Progress Note Patient Name: JOELYS GLASGOW DOB: 1932-04-08 MRN: [id number]   Date of Service  02/05/2015  HPI/Events of Note  hypoglycemia  eICU Interventions  Change IVFs to D5 NS     Intervention Category Intermediate Interventions: Other:  ALVA,RAKESH V. 02/05/2015, 3:26 AM

## 2015-02-05 NOTE — Progress Notes (Signed)
Hypoglycemic Event  CBG: 69  Treatment: D50 IV 25 mL  Symptoms: None  Follow-up CBG: Time: 0300 CBG Result:110  Possible Reasons for Event: Inadequate meal intake  Comments/MD notified: Skeet Simmer, Sharlet Salina C  Remember to initiate Hypoglycemia Order Set & complete

## 2015-02-05 NOTE — H&P (Signed)
PULMONARY / CRITICAL CARE MEDICINE HISTORY AND PHYSICAL EXAMINATION   Name: Melanie Camacho MRN: [id number] DOB: 1931/11/27    ADMISSION DATE:  01/09/2015  PRIMARY SERVICE: PCCM  CHIEF COMPLAINT:  AMS  BRIEF PATIENT DESCRIPTION: 14 F with CHF (EF 40-45%), possible small vessel CAD (clean cath 4/22), DMII who presented to [facility] as a transfer from a rehab center for AMS. Likely 2/2 acute delirium 2/2 UTI.   SIGNIFICANT EVENTS / STUDIES:  CT Head 5/28 --> No acute intracranial abnormality.   LINES / TUBES: PIV  CULTURES: Urine Culture 5/28 Blood Cultures x 2, 5/28  ANTIBIOTICS: Cefepime 5/28 - Linezolid 5/28 - (Vanc allergy) Levaquin x 1, 5/28 CTX x 1, 5/28   SUBJECTIVE:  Pt appears uncomfortable, moaning, does not follow commands  Family updated in room, per family she is a DNR  Weaned off pressors this am   VITAL SIGNS: Temp:  [91.7 F (33.2 C)-97.7 F (36.5 C)] 97.4 F (36.3 C) (05/29 1224) Pulse Rate:  [32-139] 79 (05/29 1215) Resp:  [0-40] 31 (05/29 1215) BP: (64-148)/(11-106) 103/70 mmHg (05/29 1215) SpO2:  [88 %-100 %] 89 % (05/29 1200) Weight:  [212 lb 1.3 oz (96.2 kg)] 212 lb 1.3 oz (96.2 kg) (05/29 0300) HEMODYNAMICS:   VENTILATOR SETTINGS:   INTAKE / OUTPUT: Intake/Output      05/28 0701 - 05/29 0700 05/29 0701 - 05/30 0700   I.V. (mL/kg) 1061.9 (11) 375 (3.9)   IV Piggyback 470 300   Total Intake(mL/kg) 1531.9 (15.9) 675 (7)   Urine (mL/kg/hr) 350 (0.2) 150 (0.3)   Total Output 350 150   Net +1181.9 +525          PHYSICAL EXAMINATION: General:  Elderly F appears uncomfortable  Neuro:  Not following commands, groans  HEENT:  Sclera anicteric, conjunctiva pink,, Dentures present Neck: Trachea supple and midline, (-) LAN or JVD Cardiovascular:  Irr Rate/Rhythem, NS1/S2, (-) MRG Lungs:  Limited exam 2/2 patient positioning, no gross crackles or rales Abdomen:  Obese, NT/ND/(+)BS Musculoskeletal:  2+ edema to knees bilaterally, LE wrapped Skin:   Healing shingles rash on R buttock/RLE  LABS:  CBC  Recent Labs Lab 01/12/2015 1318 01/13/2015 1329 01/20/2015 2130 02/05/15 0840  WBC 3.4*  --  3.0* 3.6*  HGB 10.3* 11.2* 9.1* 9.6*  HCT 33.5* 33.0* 29.7* 31.1*  PLT 141*  --  121* 131*   Coag's  Recent Labs Lab 02/06/2015 2130  INR 2.65*   BMET  Recent Labs Lab 01/26/2015 1318 01/24/2015 1329 01/17/2015 2130 02/05/15 0840  NA 139 138 140 141  K 5.4* 5.4* 5.7* 5.7*  CL 103 107 104 105  CO2 30  --  30 27  BUN 41* 35* 38* 38*  CREATININE 1.37* 1.60* 1.41* 1.51*  GLUCOSE 76 75 70 90   Electrolytes  Recent Labs Lab 01/15/2015 1318 02/03/2015 1320 01/16/2015 2130 02/05/15 0840  CALCIUM 8.9  --  8.3* 8.8*  MG  --  2.6* 2.6* 2.4  PHOS  --   --  3.7 3.6   Sepsis Markers  Recent Labs Lab 01/17/2015 2130 02/05/15 0230 02/05/15 0840  LATICACIDVEN 1.3 2.2* 2.0  PROCALCITON <0.10  --  <0.10   ABG  Recent Labs Lab 02/03/2015 2055 02/05/15 0330  PHART 7.581* 7.480*  PCO2ART 29.4* 37.3  PO2ART 151.0* 56.1*   Liver Enzymes  Recent Labs Lab 01/20/2015 1318 01/16/2015 2130  AST 34 28  ALT 16 14  ALKPHOS 74 63  BILITOT 1.1 1.0  ALBUMIN 3.2* 2.4*  Cardiac Enzymes  Recent Labs Lab 06-Mar-2015 2130 02/05/15 0230 02/05/15 0840  TROPONINI 0.05* 0.04* 0.06*   Glucose  Recent Labs Lab 2015-03-06 1856 02/05/15 0048 02/05/15 0235 02/05/15 0303 02/05/15 0814 02/05/15 1226  GLUCAP 73 75 69 110* 84 96    Imaging Ct Head Wo Contrast  Mar 06, 2015   CLINICAL DATA:  Lethargy.  Incontinence.  EXAM: CT HEAD WITHOUT CONTRAST  TECHNIQUE: Contiguous axial images were obtained from the base of the skull through the vertex without intravenous contrast.  COMPARISON:  01/12/2015  FINDINGS: Prominence of the sulci and ventricles noted consistent with age related brain atrophy. No evidence for acute brain infarct, intracranial hemorrhage or mass. No abnormal extra-axial fluid collections. Cranial exostosis overlying the left frontal lobe  is again noted. The paranasal sinuses and mastoid air cells are clear. The calvarium is intact.  IMPRESSION: 1. No acute intracranial abnormality. 2. Age related brain atrophy.   Electronically Signed   By: Signa Kell M.D.   On: 03-06-15 13:53   Dg Chest Portable 1 View  03-06-2015   CLINICAL DATA:  Altered mental status.  EXAM: PORTABLE CHEST - 1 VIEW  COMPARISON:  01/17/2015  FINDINGS: Persistent atelectasis versus infiltrate involving the right lower lung zone with associated probable right pleural effusion. There is a small left pleural effusion. No overt pulmonary edema identified. Interval removal of PICC line. Stable cardiac enlargement.  IMPRESSION: Persistent atelectasis versus infiltrate involving the right lower lung with associated right pleural effusion. Smaller left pleural effusion present.   Electronically Signed   By: Irish Lack M.D.   On: 2015-03-06 12:46   Dg Abd Portable 1v  02/05/2015   CLINICAL DATA:  Nasogastric tube placement.  Initial encounter.  EXAM: PORTABLE ABDOMEN - 1 VIEW  COMPARISON:  CT of the abdomen and pelvis performed 01/06/2015, and chest radiograph performed 03/06/15  FINDINGS: The patient's enteric tube is noted ending overlying the body of the stomach. Evaluation is mildly suboptimal due to motion artifact. The visualized bowel gas pattern is grossly unremarkable.  A small to moderate right-sided pleural effusion is again noted, as well as a small left pleural effusion. Mild degenerative change is noted along the lower thoracic and lumbar spine. Postoperative change is noted at the right mid abdomen.  IMPRESSION: 1. Enteric tube noted ending overlying the body of the stomach. 2. Small to moderate right-sided pleural effusion, and small left pleural effusion.   Electronically Signed   By: Roanna Raider M.D.   On: 02/05/2015 01:33    EKG: Reviewed, very poor tracing with Afib CXR: Reviewed. Limited exam. Clear CM with possible edema. Bilateral R>L  effusions.  ASSESSMENT / PLAN:  Active Problems:   Septic shock   PULMONARY A: Acute hypoxic respiratory failure:   Likely 2/2 Acute on chronic HF with possible pneumonia.  P:   Supplemental O2 to maintain Sats >= 92%  CARDIOVASCULAR A: Severe Sepsis: /Shock >weaned off pressor 5/29 , cortisol was low , if back on pressors add stress dose steroids , lactate bump to 2.2 Acute on chronic HF(EF 40-45%): mild bump in troponin ? Demand  AFib: Mechanical AV: HL: P:   IVF Hold BB blockers Hold diuretics Continue Eloquis for now May need card consult -consider if troponin rises   RENAL A: Acute on Chronic KD: Almost certainly 2/2 sepsis.  Hyperkalemia: Kayexalate 2/28  Hypocalemia: P:   Serial BMPs May need to repeat Kayelxalate if K+ tr up  Cont IVF -caution as w/ CHF -change to  KVO as b/p improves    GASTROINTESTINAL A: GERD:  P:   Cont PPI: May need TF if mentation not improving soon  HEMATOLOGIC A: Leukopenia: Chronic issue Thrombocytopenia: Chronic issue Coagulaopathy -on eliquis , INR elevated 2.65 (nml LFT )  P:   Serial CBC  Check coags in am   INFECTIOUS A: Possible Pneumonia-HCAP , Possible UTI:  5/28 UC pending >> P:   Linezolid, Zithromax  and Cefepime>may not need zithromax  Follow cultures  ENDOCRINE A: DMII:  Hypothyroidism: TSH remains high ~17  P:   SSI  Increase Levothyroxine (will likely need NG tube to get it)  NEUROLOGIC A: AMS:  Delirium ? UTI  CT head 5/28 Neg for acute  P:   Monitr Hold neurontin and requip until delirium improves   Avoid sedating rx   BEST PRACTICE / DISPOSITION Level of Care:  ICU Primary Service:  PCCM Consultants:  None    Code Status: DNR -daughter verified 5/29  Diet:  NPO DVT Px:  On AC GI Px:  PPI Skin Integrity:  As above, wound consult for LEs Social / Family:  Updated daughter 5/29 with DNR planned  .  Tammy Parrett NP-C  [facility]  [phone]     02/05/2015, 12:48 PM    Attending:  I have seen and examined the patient with nurse practitioner/resident and agree with the note above.   CXR images reviewed chronic RLL infiltrate and pleural effusion More awake for me, now off of pressors  Family states code status is DNR  Septic shock> improving, I think this is UTI rather than HCAP as RLL changes are chronic DNR   CC time by me 45 minutes  Heber [place], MD  PCCM Pager: [phone] Cell: [phone] After 3pm or if no response, call [phone]

## 2015-02-05 NOTE — Progress Notes (Signed)
eLink Physician-Brief Progress Note Patient Name: Melanie Camacho DOB: 04-Apr-1932 MRN: [id number]   Date of Service  02/05/2015  HPI/Events of Note  Complains of abdominal pain. Allergy to Morphine and opioids.   eICU Interventions  Will order: 1. KUB 2. Amylase and lipase now. 3. Motrin PRN.     Intervention Category Intermediate Interventions: Abdominal pain - evaluation and management  Sommer,Steven Eugene 02/05/2015, 6:13 PM

## 2015-02-05 NOTE — Progress Notes (Signed)
CRITICAL VALUE ALERT  Critical value received:  Lactic acid 2.2  Date of notification:  02/05/15  Time of notification:  0356  Critical value read back:Yes.    Nurse who received alert:  Burtis Junes  MD notified (1st page):  Vassie Loll  Time of first page:  0357  MD notified (2nd page):  Time of second page:  Responding MD:  Vassie Loll  Time MD responded:  [phone]

## 2015-02-05 NOTE — Progress Notes (Signed)
Pharmacist Heart Failure Core Measure Documentation  Assessment: Melanie Camacho has an EF documented as 40-45% on 01/24/15 by ECHO.  Rationale: Heart failure patients with left ventricular systolic dysfunction (LVSD) and an EF < 40% should be prescribed an angiotensin converting enzyme inhibitor (ACEI) or angiotensin receptor blocker (ARB) at discharge unless a contraindication is documented in the medical record.  This patient is not currently on an ACEI or ARB for HF.  This note is being placed in the record in order to provide documentation that a contraindication to the use of these agents is present for this encounter.  ACE Inhibitor or Angiotensin Receptor Blocker is contraindicated (specify all that apply)  []   ACEI allergy AND ARB allergy []   Angioedema []   Moderate or severe aortic stenosis [x]   Hyperkalemia [x]   Hypotension []   Renal artery stenosis [x]   Worsening renal function, preexisting renal disease or dysfunction   Fayne Norrie 02/05/2015 3:19 PM

## 2015-02-06 ENCOUNTER — Inpatient Hospital Stay (HOSPITAL_COMMUNITY): Payer: Medicare Other

## 2015-02-06 DIAGNOSIS — I5022 Chronic systolic (congestive) heart failure: Secondary | ICD-10-CM

## 2015-02-06 DIAGNOSIS — N179 Acute kidney failure, unspecified: Secondary | ICD-10-CM

## 2015-02-06 LAB — CBC
HEMATOCRIT: 32.4 % — AB (ref 36.0–46.0)
HEMOGLOBIN: 9.9 g/dL — AB (ref 12.0–15.0)
MCH: 31.6 pg (ref 26.0–34.0)
MCHC: 30.6 g/dL (ref 30.0–36.0)
MCV: 103.5 fL — ABNORMAL HIGH (ref 78.0–100.0)
PLATELETS: 125 10*3/uL — AB (ref 150–400)
RBC: 3.13 MIL/uL — ABNORMAL LOW (ref 3.87–5.11)
RDW: 20.8 % — ABNORMAL HIGH (ref 11.5–15.5)
WBC: 3.4 10*3/uL — AB (ref 4.0–10.5)

## 2015-02-06 LAB — GLUCOSE, CAPILLARY
GLUCOSE-CAPILLARY: 117 mg/dL — AB (ref 65–99)
GLUCOSE-CAPILLARY: 96 mg/dL (ref 65–99)
Glucose-Capillary: 95 mg/dL (ref 65–99)
Glucose-Capillary: 91 mg/dL (ref 65–99)
Glucose-Capillary: 107 mg/dL — ABNORMAL HIGH (ref 65–99)
Glucose-Capillary: 91 mg/dL (ref 65–99)
Glucose-Capillary: 162 mg/dL — ABNORMAL HIGH (ref 65–99)

## 2015-02-06 LAB — BASIC METABOLIC PANEL
Anion gap: 9 (ref 5–15)
BUN: 37 mg/dL — ABNORMAL HIGH (ref 6–20)
CALCIUM: 8.7 mg/dL — AB (ref 8.9–10.3)
CO2: 27 mmol/L (ref 22–32)
Chloride: 106 mmol/L (ref 101–111)
Creatinine, Ser: 1.6 mg/dL — ABNORMAL HIGH (ref 0.44–1.00)
GFR calc non Af Amer: 29 mL/min — ABNORMAL LOW (ref >60)
GFR, EST AFRICAN AMERICAN: 34 mL/min — AB (ref >60)
GLUCOSE: 105 mg/dL — AB (ref 65–99)
POTASSIUM: 5.2 mmol/L — AB (ref 3.5–5.1)
SODIUM: 142 mmol/L (ref 135–145)

## 2015-02-06 LAB — URINE CULTURE: Colony Count: >=100000

## 2015-02-06 LAB — PROCALCITONIN: Procalcitonin: <0.1 ng/mL

## 2015-02-06 LAB — PROTIME-INR
INR: 3.19 — ABNORMAL HIGH (ref 0.00–1.49)
Prothrombin Time: 32.1 seconds — ABNORMAL HIGH (ref 11.6–15.2)

## 2015-02-06 NOTE — Progress Notes (Signed)
Initial Nutrition Assessment  DOCUMENTATION CODES:  Morbid obesity  INTERVENTION:   (Advance diet as medically appropriate, RD to add interventions accordingly)  NUTRITION DIAGNOSIS:  Inadequate oral intake related to inability to eat as evidenced by NPO status  GOAL:  Patient will meet greater than or equal to 90% of their needs  MONITOR:  Diet advancement, PO intake, Labs, Weight trends, I & O's  REASON FOR ASSESSMENT:  Malnutrition Screening Tool  ASSESSMENT: 61 F with CHF (EF 40-45%), possible small vessel CAD (clean cath 4/22), DMII who presented to [facility] as a transfer from a rehab center for AMS. Likely 2/2 acute delirium 2/2 UTI.  RD spoke with patient's daughter at bedside.  Reports pt was eating well prior to getting "sick".  She states patient weight has fluctuates a lot due to fluid.  Does not like Ensure or Boost supplements.  Currently NPO.  RD to monitor diet advancement, add supplements as needed.  Limited nutrition focused physical exam completed.  No muscle or subcutaneous fat depletion noticed.  Height:  Ht Readings from Last 1 Encounters:  02/05/15 5\' 1"  (1.549 m)    Weight:  Wt Readings from Last 1 Encounters:  02/06/15 214 lb 4.6 oz (97.2 kg)    Ideal Body Weight:  47.7 kg  Wt Readings from Last 10 Encounters:  02/06/15 214 lb 4.6 oz (97.2 kg)  01/26/15 210 lb 3.2 oz (95.346 kg)  12/30/14 230 lb 9.6 oz (104.599 kg)  12/29/13 193 lb 5.5 oz (87.7 kg)    BMI:  Body mass index is 40.51 kg/(m^2).  Estimated Nutritional Needs:  Kcal:  1700-1900  Protein:  90-100 gm  Fluid:  1.7-1.9 L  Skin:  Reviewed, no issues  Diet Order:  Diet Heart Room service appropriate?: Yes; Fluid consistency:: Thin  EDUCATION NEEDS:  No education needs identified at this time   Intake/Output Summary (Last 24 hours) at 02/06/15 1145 Last data filed at 02/06/15 1100  Gross per 24 hour  Intake   2535 ml  Output    500 ml  Net   2035 ml    Last BM:   Unknown   Maureen Chatters, RD, LDN Pager #: [phone] After-Hours Pager #: [phone]

## 2015-02-06 NOTE — Progress Notes (Signed)
PULMONARY / CRITICAL CARE MEDICINE HISTORY AND PHYSICAL EXAMINATION   Name: Melanie Camacho MRN: [id number] DOB: November 13, 1931    ADMISSION DATE:  2015-02-12  PRIMARY SERVICE: PCCM  CHIEF COMPLAINT:  AMS  BRIEF PATIENT DESCRIPTION: 89 F with CHF (EF 40-45%), possible small vessel CAD (clean cath 4/22), DMII who presented to [facility] as a transfer from a rehab center for AMS. Likely 2/2 acute delirium 2/2 UTI.   SIGNIFICANT EVENTS / STUDIES:  CT Head 5/28 --> No acute intracranial abnormality.   LINES / TUBES: PIV  CULTURES: Urine Culture 5/28> 100K >> Blood Cultures x 2, 5/28  ANTIBIOTICS: Cefepime 5/28 - Linezolid 5/28 - (Vanc allergy) Levaquin x 1, 5/28 CTX x 1, 5/28   SUBJECTIVE:  More awake, but sleepy  remains off pressors B/p soft  Temps low  Family updated in room   VITAL SIGNS: Temp:  [94 F (34.4 C)-97.5 F (36.4 C)] 94 F (34.4 C) (05/30 0800) Pulse Rate:  [25-94] 63 (05/30 1100) Resp:  [8-35] 15 (05/30 1100) BP: (78-120)/(25-88) 81/40 mmHg (05/30 1100) SpO2:  [87 %-100 %] 100 % (05/30 1100) Weight:  [214 lb 4.6 oz (97.2 kg)] 214 lb 4.6 oz (97.2 kg) (05/30 0500) HEMODYNAMICS:   VENTILATOR SETTINGS:   INTAKE / OUTPUT: Intake/Output      05/29 0701 - 05/30 0700 05/30 0701 - 05/31 0700   I.V. (mL/kg) 1725 (17.7) 300 (3.1)   NG/GT  160   IV Piggyback 650 300   Total Intake(mL/kg) 2375 (24.4) 760 (7.8)   Urine (mL/kg/hr) 500 (0.2) 100 (0.2)   Stool 0 (0)    Total Output 500 100   Net +1875 +660        Stool Occurrence 1 x      PHYSICAL EXAMINATION: General:  Elderly F sleeping  Neuro:  Followed commands per nursing  HEENT:  Sclera anicteric, conjunctiva pink,, Dentures present Neck: Trachea supple and midline, (-) LAN or JVD Cardiovascular:  Irr Rate/Rhythem, NS1/S2, (-) MRG Lungs:  Limited exam 2/2 patient positioning, no gross crackles or rales Abdomen:  Obese, NT/ND/(+)BS Musculoskeletal:  2+ edema to knees bilaterally, LE wrapped Skin:  Healing  shingles rash on R buttock/RLE  LABS:  CBC  Recent Labs Lab February 12, 2015 2130 02/05/15 0840 02/06/15 0205  WBC 3.0* 3.6* 3.4*  HGB 9.1* 9.6* 9.9*  HCT 29.7* 31.1* 32.4*  PLT 121* 131* 125*   Coag's  Recent Labs Lab 02/12/2015 2130 02/06/15 0205  INR 2.65* 3.19*   BMET  Recent Labs Lab 12-Feb-2015 2130 02/05/15 0840 02/06/15 0205  NA 140 141 142  K 5.7* 5.7* 5.2*  CL 104 105 106  CO2 30 27 27   BUN 38* 38* 37*  CREATININE 1.41* 1.51* 1.60*  GLUCOSE 70 90 105*   Electrolytes  Recent Labs Lab Feb 12, 2015 1320 02-12-2015 2130 02/05/15 0840 02/06/15 0205  CALCIUM  --  8.3* 8.8* 8.7*  MG 2.6* 2.6* 2.4  --   PHOS  --  3.7 3.6  --    Sepsis Markers  Recent Labs Lab 2015-02-12 2130 02/05/15 0230 02/05/15 0840 02/06/15 0205  LATICACIDVEN 1.3 2.2* 2.0  --   PROCALCITON <0.10  --  <0.10 <0.10   ABG  Recent Labs Lab 12-Feb-2015 2055 02/05/15 0330  PHART 7.581* 7.480*  PCO2ART 29.4* 37.3  PO2ART 151.0* 56.1*   Liver Enzymes  Recent Labs Lab 02/12/15 1318 February 12, 2015 2130  AST 34 28  ALT 16 14  ALKPHOS 74 63  BILITOT 1.1 1.0  ALBUMIN 3.2* 2.4*  Cardiac Enzymes  Recent Labs Lab 02-08-15 2130 02/05/15 0230 02/05/15 0840  TROPONINI 0.05* 0.04* 0.06*   Glucose  Recent Labs Lab 02/05/15 0814 02/05/15 1226 02/05/15 1629 02/05/15 2000 02/06/15 0340 02/06/15 0753  GLUCAP 84 96 103* 121* 96 95    Imaging Ct Head Wo Contrast  02-08-2015   CLINICAL DATA:  Lethargy.  Incontinence.  EXAM: CT HEAD WITHOUT CONTRAST  TECHNIQUE: Contiguous axial images were obtained from the base of the skull through the vertex without intravenous contrast.  COMPARISON:  01/12/2015  FINDINGS: Prominence of the sulci and ventricles noted consistent with age related brain atrophy. No evidence for acute brain infarct, intracranial hemorrhage or mass. No abnormal extra-axial fluid collections. Cranial exostosis overlying the left frontal lobe is again noted. The paranasal sinuses and  mastoid air cells are clear. The calvarium is intact.  IMPRESSION: 1. No acute intracranial abnormality. 2. Age related brain atrophy.   Electronically Signed   By: Signa Kell M.D.   On: 02/08/15 13:53   Dg Chest Port 1 View  02/06/2015   CLINICAL DATA:  Pneumonia  EXAM: PORTABLE CHEST - 1 VIEW  COMPARISON:  02/08/2015  FINDINGS: Cardiac shadow is within normal limits. Persistent right-sided pleural effusion and right basilar changes are noted. A smaller left-sided pleural effusion is again seen and stable. A nasogastric catheter is extending into the stomach. Aortic calcifications are seen.  IMPRESSION: Stable bilateral pleural effusions and right basilar changes.  New nasogastric catheter   Electronically Signed   By: Alcide Clever M.D.   On: 02/06/2015 07:33   Dg Chest Portable 1 View  02-08-15   CLINICAL DATA:  Altered mental status.  EXAM: PORTABLE CHEST - 1 VIEW  COMPARISON:  01/17/2015  FINDINGS: Persistent atelectasis versus infiltrate involving the right lower lung zone with associated probable right pleural effusion. There is a small left pleural effusion. No overt pulmonary edema identified. Interval removal of PICC line. Stable cardiac enlargement.  IMPRESSION: Persistent atelectasis versus infiltrate involving the right lower lung with associated right pleural effusion. Smaller left pleural effusion present.   Electronically Signed   By: Irish Lack M.D.   On: 08-Feb-2015 12:46   Dg Abd Portable 1v  02/05/2015   CLINICAL DATA:  Acute onset of generalized abdominal pain and distention. Initial encounter.  EXAM: PORTABLE ABDOMEN - 1 VIEW  COMPARISON:  Abdominal radiograph performed earlier today at 1:15 a.m.  FINDINGS: The patient's enteric tube is noted ending overlying the body of the stomach.  The visualized bowel gas pattern is grossly unremarkable. No free intra-abdominal air is seen, though evaluation for free air is limited on a single supine view. Scattered clips are noted at  the right mid abdomen.  Mild degenerative change is noted along the lumbar spine.  IMPRESSION: 1. Enteric tube noted ending overlying the body of the stomach. 2. Unremarkable bowel gas pattern; no free intra-abdominal air seen. Small amount of stool noted in the colon.   Electronically Signed   By: Roanna Raider M.D.   On: 02/05/2015 18:44   Dg Abd Portable 1v  02/05/2015   CLINICAL DATA:  Nasogastric tube placement.  Initial encounter.  EXAM: PORTABLE ABDOMEN - 1 VIEW  COMPARISON:  CT of the abdomen and pelvis performed 01/06/2015, and chest radiograph performed 08-Feb-2015  FINDINGS: The patient's enteric tube is noted ending overlying the body of the stomach. Evaluation is mildly suboptimal due to motion artifact. The visualized bowel gas pattern is grossly unremarkable.  A small to moderate  right-sided pleural effusion is again noted, as well as a small left pleural effusion. Mild degenerative change is noted along the lower thoracic and lumbar spine. Postoperative change is noted at the right mid abdomen.  IMPRESSION: 1. Enteric tube noted ending overlying the body of the stomach. 2. Small to moderate right-sided pleural effusion, and small left pleural effusion.   Electronically Signed   By: Roanna Raider M.D.   On: 02/05/2015 01:33    EKG: Reviewed, very poor tracing with Afib CXR: Reviewed.   Bilateral R>L effusions.  ASSESSMENT / PLAN:  Active Problems:   Septic shock   PULMONARY A: Acute hypoxic respiratory failure:   Likely 2/2 Acute on chronic HF with possible pneumonia.  P:   Supplemental O2 to maintain Sats >= 92%  CARDIOVASCULAR A: Severe Sepsis: /Shock >weaned off pressor 5/29 , cortisol was low , if back on pressors add stress dose steroids , lactate bump to 2.2 Acute on chronic HF(EF 40-45%): mild bump in troponin ? Demand  AFib: Mechanical AV: HL: P:   IVF Hold BB blockers Hold diuretics Hold Eliquis  for now as INR supratherapeutic     RENAL A: Acute on  Chronic KD: Almost certainly 2/2 sepsis.  Hyperkalemia: Kayexalate 5/28 >K+tr down  Hypocalemia: P:   Serial BMPs May need to repeat Kayelxalate if K+ tr up  Cont IVF -caution as w/ CHF -change to [facility] as b/p improves    GASTROINTESTINAL A: GERD:  abd pain >5/29 KUB unremarkable  P:   Cont PPI: Remain NPO too groggy to eat -start TF if mentation does not improves   HEMATOLOGIC A: Leukopenia: Chronic issue Thrombocytopenia: Chronic issue Coagulaopathy -on eliquis , INR elevated 2.65 (nml LFT ) > tr up 3.19 P:   Serial CBC  Check coags/lft  in am  Hold Eliquis  Recheck INR in am  SCD   INFECTIOUS A: Possible Pneumonia-HCAP ,  UTI:  5/28 UC pending >> P:   Linezolid, Zithromax  and Cefepime>may not need zithromax  Follow cultures  ENDOCRINE A: DMII:  Hypothyroidism: TSH remains high ~17 >levothyroxine increased 5/29 P:   SSI Cont levothyroixine per NGT  Will need f/up TSH   NEUROLOGIC A: AMS:  Delirium ? UTI  CT head 5/28 Neg for acute  P:   Monitr Hold neurontin and requip until delirium improves   Avoid sedating rx   BEST PRACTICE / DISPOSITION Level of Care:  ICU Primary Service:  PCCM Consultants:  None    Code Status: DNR -daughter verified 5/29  Diet:  NPO DVT Px:  On AC GI Px:  PPI Skin Integrity:  As above, wound consult for LEs Social / Family:  Updated daughter 5/29, 5/30  .  Tammy Parrett NP-C  [facility]  [phone]    02/06/2015, 11:35 AM  Attending:  I have seen and examined the patient with nurse practitioner/resident and agree with the note above.   She is off and on pressors, hypothermic.  We are not certain if she has pneumonia given the chronic findings.  I suspect the CXR findings represent a chronic finding given old images I was able to review.  Renal function noted, not a good HD candidate given.  Daughter tells me that she has short term memory loss, poor functional status and has been dependent  on family for a year.  Capable of walking and basic ADL's in the last year.  Never diagnosed with dementia.    CXR with chronic  appearing infiltrate and RLL effusion Recent discharge summaries reviewed where she was treated for CHF exacerbation  Will stop Linezolid, f/u urine culture, repeat CXR in AM.  May need to consider thoracentesis if infectious syndrome worsens, but for now seems improved.  Consider advanced heart failure consult 5/31  Daughter updated by phone at length by me  Maintain in ICU today, advance diet today hopefully if mental status improves.  Heber Rangely, MD  PCCM Pager: [phone] Cell: [phone] After 3pm or if no response, call [phone]

## 2015-02-07 DIAGNOSIS — N390 Urinary tract infection, site not specified: Secondary | ICD-10-CM | POA: Insufficient documentation

## 2015-02-07 DIAGNOSIS — R410 Disorientation, unspecified: Secondary | ICD-10-CM

## 2015-02-07 DIAGNOSIS — R4182 Altered mental status, unspecified: Secondary | ICD-10-CM | POA: Insufficient documentation

## 2015-02-07 LAB — CBC
HCT: 35.4 % — ABNORMAL LOW (ref 36.0–46.0)
Hemoglobin: 10.6 g/dL — ABNORMAL LOW (ref 12.0–15.0)
MCH: 31.8 pg (ref 26.0–34.0)
MCHC: 29.9 g/dL — AB (ref 30.0–36.0)
MCV: 106.3 fL — ABNORMAL HIGH (ref 78.0–100.0)
PLATELETS: 102 10*3/uL — AB (ref 150–400)
RBC: 3.33 MIL/uL — ABNORMAL LOW (ref 3.87–5.11)
RDW: 20.6 % — AB (ref 11.5–15.5)
WBC: 4.5 10*3/uL (ref 4.0–10.5)

## 2015-02-07 LAB — COMPREHENSIVE METABOLIC PANEL
ALBUMIN: 2.6 g/dL — AB (ref 3.5–5.0)
ALT: 16 U/L (ref 14–54)
AST: 34 U/L (ref 15–41)
Alkaline Phosphatase: 65 U/L (ref 38–126)
Anion gap: 8 (ref 5–15)
BUN: 35 mg/dL — ABNORMAL HIGH (ref 6–20)
CO2: 25 mmol/L (ref 22–32)
CREATININE: 1.6 mg/dL — AB (ref 0.44–1.00)
Calcium: 8.6 mg/dL — ABNORMAL LOW (ref 8.9–10.3)
Chloride: 106 mmol/L (ref 101–111)
GFR, EST AFRICAN AMERICAN: 34 mL/min — AB (ref >60)
GFR, EST NON AFRICAN AMERICAN: 29 mL/min — AB (ref >60)
Glucose, Bld: 102 mg/dL — ABNORMAL HIGH (ref 65–99)
POTASSIUM: 4.4 mmol/L (ref 3.5–5.1)
Sodium: 139 mmol/L (ref 135–145)
Total Bilirubin: 1 mg/dL (ref 0.3–1.2)
Total Protein: 6.7 g/dL (ref 6.5–8.1)

## 2015-02-07 LAB — GLUCOSE, CAPILLARY
GLUCOSE-CAPILLARY: 70 mg/dL (ref 65–99)
GLUCOSE-CAPILLARY: 101 mg/dL — AB (ref 65–99)
GLUCOSE-CAPILLARY: 79 mg/dL (ref 65–99)
Glucose-Capillary: 101 mg/dL — ABNORMAL HIGH (ref 65–99)

## 2015-02-07 LAB — PROTIME-INR
INR: 2.47 — ABNORMAL HIGH (ref 0.00–1.49)
Prothrombin Time: 26.4 seconds — ABNORMAL HIGH (ref 11.6–15.2)

## 2015-02-07 MED ORDER — TORSEMIDE 20 MG PO TABS
20.0000 mg | ORAL_TABLET | Freq: Two times a day (BID) | ORAL | Status: DC
  Administered 2015-02-07 – 2015-02-08 (×3): 20 mg via ORAL
  Filled 2015-02-07 (×5): qty 1

## 2015-02-07 MED ORDER — APIXABAN 2.5 MG PO TABS
2.5000 mg | ORAL_TABLET | Freq: Two times a day (BID) | ORAL | Status: DC
  Administered 2015-02-07 – 2015-02-10 (×8): 2.5 mg via ORAL
  Filled 2015-02-07 (×9): qty 1

## 2015-02-07 MED ORDER — ATROPINE SULFATE 0.1 MG/ML IJ SOLN
INTRAMUSCULAR | Status: AC
  Filled 2015-02-07: qty 10

## 2015-02-07 NOTE — Care Management Note (Signed)
Case Management Note  Patient Details  Name: Melanie Camacho MRN: [id number] Date of Birth: 1932/01/03  Subjective/Objective:  Talked with daughter Arline Asp, in room with patient.  Patient in bed, awake and alert.  Patient has been to a SNF x2 for rehab and comes back to hospital.  Daughter states, Mom will not go back to a SNF but home with her sister, [place] in [place], [place].    At home they have a BSC, Walker with wheels and without a motorized w/c, Recliner lift,  They would like a hospital bed on discharge.  Patient has Medicare and Medicaid of [place].  Starting search for [facility] agency in [place] area that will take patient and who has a HH SW, so they can assist with community assist programs.  Daughter working on Health visitor of [place].             Action/Plan: CM will continue to follow.   Expected Discharge Date:                  Expected Discharge Plan:  Home w Home Health Services  In-House Referral:     Discharge planning Services     Post Acute Care Choice:    Choice offered to:     DME Arranged:    DME Agency:     HH Arranged:    HH Agency:     Status of Service:  In process, will continue to follow  Medicare Important Message Given:  Yes Date Medicare IM Given:  02/07/15 Medicare IM give by:  Avie Arenas, RNBSN Date Additional Medicare IM Given:    Additional Medicare Important Message give by:     If discussed at Long Length of Stay Meetings, dates discussed:    Additional Comments:  Vangie Bicker, RN 02/07/2015, 1:31 PM

## 2015-02-07 NOTE — Progress Notes (Addendum)
PULMONARY / CRITICAL CARE MEDICINE HISTORY AND PHYSICAL EXAMINATION   Name: Melanie Camacho MRN: [id number] DOB: 1932/05/11    ADMISSION DATE:  01/10/2015  PRIMARY SERVICE: PCCM  CHIEF COMPLAINT:  AMS  BRIEF PATIENT DESCRIPTION: 38 F with CHF (EF 40-45%), possible small vessel CAD (clean cath 4/22), DMII who presented to [facility] as a transfer from a rehab center for AMS. Likely 2/2 acute delirium 2/2 UTI.   LINES / TUBES: PIV  CULTURES: Urine Culture 5/28> 100K >> Blood Cultures x 2, 5/28  ANTIBIOTICS: Cefepime 5/28 - Linezolid 5/28 - (Vanc allergy) Levaquin x 1, 5/28 CTX x 1, 5/28   SIGNIFICANT EVENTS / STUDIES:  CT Head 5/28 --> No acute intracranial abnormality.   02/06/15: Dr Kendrick Fries intake: She is off and on pressors, hypothermic.  We are not certain if she has pneumonia given the chronic findings. Suspect the CXR findings represent a chronic finding given old images I was able to review.  Renal function noted, not a good HD candidate given.  Daughter tells me that she has short term memory loss, poor functional status and has been dependent on family for a year.  Capable of walking and basic ADL's in the last year.  Never diagnosed with dementia.     More awake, but sleepy  remains off pressors B/p soft  Temps low     SUBJECTIVE/OVERNIGHT/INTERVAL HX 02/07/15: RN reported sinus bradycardia ? Artifact but EKG unchanged  from baseline and HR 70s (baseline: small voltage with poor R wave progression). Daughter Arline Asp at bedside and wanted to know blood culture results. Also, patient has NG tube but apparently daughter fed her yesterdy. Sub-normal temp continues (daughter says "she is always cold and never 70F"). Daughter also concerned she is more dyspneic after fluids and weight up   VITAL SIGNS: Temp:  [93.9 F (34.4 C)-97.6 F (36.4 C)] 93.9 F (34.4 C) (05/31 0822) Pulse Rate:  [25-112] 25 (05/31 0600) Resp:  [12-25] 12 (05/31 0600) BP: (85-120)/(33-68) 109/43 mmHg (05/31  0600) SpO2:  [92 %-100 %] 95 % (05/31 0600) Weight:  [98.6 kg (217 lb 6 oz)] 98.6 kg (217 lb 6 oz) (05/31 0500) HEMODYNAMICS:   VENTILATOR SETTINGS:   INTAKE / OUTPUT: Intake/Output      05/30 0701 - 05/31 0700 05/31 0701 - 06/01 0700   P.O. 300    I.V. (mL/kg) 1725 (17.5) 150 (1.5)   IV Piggyback 475    Total Intake(mL/kg) 2500 (25.4) 150 (1.5)   Urine (mL/kg/hr) 745 (0.3)    Stool     Total Output 745     Net +1755 +150          PHYSICAL EXAMINATION: General:  Elderly awake, Daughter at bedside. Deconditoned looking Neuro: Awake and altert. Oriented. Normal speech. Calm. Able to move 4s.  HEENT:  Sclera anicteric, conjunctiva pink,, Dentures present Neck: Trachea supple and midline, (-) LAN or JVD Cardiovascular:  Irr Rate/Rhythem, NS1/S2, (-) MRG Lungs:  Limited exam 2/2 patient positioning, no gross crackles or rales Abdomen:  Obese, NT/ND/(+)BS Musculoskeletal:  2+ edema to knees bilaterally, LE wrapped Skin:  Healing shingles rash on R buttock/RLE  LABS:  PULMONARY  Recent Labs Lab 02/06/2015 1329 01/10/2015 2055 02/05/15 0330  PHART  --  7.581* 7.480*  PCO2ART  --  29.4* 37.3  PO2ART  --  151.0* 56.1*  HCO3  --  28.2* 27.4*  TCO2 25 29 28.6  O2SAT  --  100.0 88.9    CBC  Recent Labs Lab  02/05/15 0840 02/06/15 0205 02/07/15 0233  HGB 9.6* 9.9* 10.6*  HCT 31.1* 32.4* 35.4*  WBC 3.6* 3.4* 4.5  PLT 131* 125* 102*    COAGULATION  Recent Labs Lab 01/26/2015 2130 02/06/15 0205 02/07/15 0233  INR 2.65* 3.19* 2.47*    CARDIAC   Recent Labs Lab 01/11/2015 1318 02/02/2015 2130 02/05/15 0230 02/05/15 0840  TROPONINI 0.04* 0.05* 0.04* 0.06*   No results for input(s): PROBNP in the last 168 hours.   CHEMISTRY  Recent Labs Lab 01/29/2015 1318 01/22/2015 1320 01/21/2015 1329 01/08/2015 2130 02/05/15 0840 02/06/15 0205 02/07/15 0233  NA 139  --  138 140 141 142 139  K 5.4*  --  5.4* 5.7* 5.7* 5.2* 4.4  CL 103  --  107 104 105 106 106  CO2 30  --    --  30 27 27 25   GLUCOSE 76  --  75 70 90 105* 102*  BUN 41*  --  35* 38* 38* 37* 35*  CREATININE 1.37*  --  1.60* 1.41* 1.51* 1.60* 1.60*  CALCIUM 8.9  --   --  8.3* 8.8* 8.7* 8.6*  MG  --  2.6*  --  2.6* 2.4  --   --   PHOS  --   --   --  3.7 3.6  --   --    Estimated Creatinine Clearance: 30.1 mL/min (by C-G formula based on Cr of 1.6).   LIVER  Recent Labs Lab 01/11/2015 1318 01/08/2015 2130 02/06/15 0205 02/07/15 0233  AST 34 28  --  34  ALT 16 14  --  16  ALKPHOS 74 63  --  65  BILITOT 1.1 1.0  --  1.0  PROT 7.4 6.3*  --  6.7  ALBUMIN 3.2* 2.4*  --  2.6*  INR  --  2.65* 3.19* 2.47*     INFECTIOUS  Recent Labs Lab 01/29/2015 2130 02/05/15 0230 02/05/15 0840 02/06/15 0205  LATICACIDVEN 1.3 2.2* 2.0  --   PROCALCITON <0.10  --  <0.10 <0.10     ENDOCRINE CBG (last 3)   Recent Labs  02/06/15 2344 02/07/15 0343 02/07/15 0817  GLUCAP 117* 101* 101*         IMAGING x48h  - image(s) personally visualized  -   highlighted in bold Dg Chest Port 1 View  02/06/2015   CLINICAL DATA:  Pneumonia  EXAM: PORTABLE CHEST - 1 VIEW  COMPARISON:  02/06/2015  FINDINGS: Cardiac shadow is within normal limits. Persistent right-sided pleural effusion and right basilar changes are noted. A smaller left-sided pleural effusion is again seen and stable. A nasogastric catheter is extending into the stomach. Aortic calcifications are seen.  IMPRESSION: Stable bilateral pleural effusions and right basilar changes.  New nasogastric catheter   Electronically Signed   By: Alcide Clever M.D.   On: 02/06/2015 07:33   Dg Abd Portable 1v  02/05/2015   CLINICAL DATA:  Acute onset of generalized abdominal pain and distention. Initial encounter.  EXAM: PORTABLE ABDOMEN - 1 VIEW  COMPARISON:  Abdominal radiograph performed earlier today at 1:15 a.m.  FINDINGS: The patient's enteric tube is noted ending overlying the body of the stomach.  The visualized bowel gas pattern is grossly unremarkable. No  free intra-abdominal air is seen, though evaluation for free air is limited on a single supine view. Scattered clips are noted at the right mid abdomen.  Mild degenerative change is noted along the lumbar spine.  IMPRESSION: 1. Enteric tube noted ending overlying  the body of the stomach. 2. Unremarkable bowel gas pattern; no free intra-abdominal air seen. Small amount of stool noted in the colon.   Electronically Signed   By: Roanna Raider M.D.   On: 02/05/2015 18:44        ASSESSMENT / PLAN:  Active Problems:   Septic shock   PULMONARY A: Acute hypoxic respiratory failure:   Likely 2/2 Acute on chronic HF with possible pneumonia.  - on RA. CXR with likely chronic findings on 02/07/15   P:   Supplemental O2 to maintain Sats >= 92%  CARDIOVASCULAR A: Severe Sepsis: /Shock >weaned off pressor 5/29 , cortisol was low , if back on pressors add stress dose steroids , lactate bump to 2.2 Acute on chronic HF(EF 40-45%): mild bump in troponin ? Demand  AFib: Mechanical AV: HL:  - sinus brady HR 55-70 to continue. INR high due to Eliquis. Daughter concerned about vlume overload  P:   Hold BB blockers Restart home torsemide Continue Eliquis (d/w pharm - poor correlation between INR and eliquis) but monitor Saline lock    RENAL     A: Acute on Chronic KD: Almost certainly 2/2 sepsis (baseline % to 1.3mg %)  Hyperkalemia: Kayexalate 5/28 >K+tr down  Hypocalemia:  - K normal. Creat 1.6mg % - higher than baseline but holding  P:   Serial BMPs Saline lock and monintor Monitor creat after torsemide restart  GASTROINTESTINAL A: GERD:  abd pain >5/29 KUB unremarkable  P:   Cont PPI: Dc og/ng and start po diet (she is awake)   HEMATOLOGIC A: Leukopenia: Chronic issue Thrombocytopenia: Chronic issue Coagulaopathy -on eliquis , INR elevated 2.65 (nml LFT ) > tr up 3.19    - stable  P:   Serial CBC  recstart Eliquis  Recheck INR in am  SCD    INFECTIOUS A: Possible Pneumonia-HCAP  - less likely UTI:  5/28 UC e colii - sensit pending   - ongoing hypothermia - possibly related to high TSH. Daughters says she is always "cold and never 84f" but unclear baseline  P:   Rocephin and levaquin 2015-02-23 >.02-23-15 DC Azihtromycin 02/05/15 >02/06/15 Linezolid 5./28/16 >>5./30/16 Cefepime February 23, 2015 (needs to narrow after urine cultures back) >> Monitor temp  ENDOCRINE A: DMII: hgb A1C 5.5 in April 2016 Hypothyroidism: TSH remains high ~17 >levothyroxine increased 5/29 - can explain chronic hypothermia  P:   Dc ssi Cont levothyroixine per NGT  Will need f/up TSH   NEUROLOGIC A: AMS:  Delirium ? UTI  CT head 5/28 Neg for acute    - awake and oriuented x 3 02/07/15  P:   Monitr Hold neurontin and requip 02/07/15 - aim to restart 02/08/15  - likely needs renal adjustment Avoid sedating rx   BEST PRACTICE / DISPOSITION Level of Care:  Move to floor Primary Service:  Will ask TR[facility] for primary 02/08/15 Consultants:  None    Code Status: DNR -daughter verified 5/29 . Daughter Arline Asp updated 02/07/15 Diet: start eating DVT Px:  On AC GI Px:  PPI Skin Integrity:  As above, wound consult for LEs Social / Family:  Updated daughter 5/29, 5/30 and 02/07/15 .     Dr. Kalman Shan, M.D., [facility].C.P Pulmonary and Critical Care Medicine Staff Physician [facility] Pager: [phone], If no answer or between  15:00h - 7:00h: call [phone]  02/07/2015 11:46 AM

## 2015-02-07 NOTE — Progress Notes (Addendum)
ANTIBIOTIC CONSULT NOTE  Pharmacy Consult for cefepime, azithromycin Indication: sepsis  Allergies  Allergen Reactions  . Morphine And Related     Stevens-Johnsons  . Bacitracin Rash  . Betadine [Povidone Iodine] Rash  . Clindamycin/Lincomycin Rash  . Other Rash    Lincomycin  . Tape Itching and Rash    Please use "paper" tape.  . Vancomycin Rash    Patient Measurements: Height: 5\' 1"  (154.9 cm) Weight: 217 lb 6 oz (98.6 kg) IBW/kg (Calculated) : 47.8  Weight: 95.3 kg  Vital Signs: Temp: 93.9 F (34.4 C) (05/31 0822) Temp Source: Rectal (05/31 0822) BP: 109/43 mmHg (05/31 0600) Pulse Rate: 25 (05/31 0600) Intake/Output from previous day: 05/30 0701 - 05/31 0700 In: 2500 [P.O.:300; I.V.:1725; IV Piggyback:475] Out: 745 [Urine:745] Intake/Output from this shift: Total I/O In: 150 [I.V.:150] Out: -   Labs:  Recent Labs  02/05/15 0840 02/06/15 0205 02/07/15 0233  WBC 3.6* 3.4* 4.5  HGB 9.6* 9.9* 10.6*  PLT 131* 125* 102*  CREATININE 1.51* 1.60* 1.60*   Estimated Creatinine Clearance: 30.1 mL/min (by C-G formula based on Cr of 1.6). No results for input(s): VANCOTROUGH, VANCOPEAK, VANCORANDOM, GENTTROUGH, GENTPEAK, GENTRANDOM, TOBRATROUGH, TOBRAPEAK, TOBRARND, AMIKACINPEAK, AMIKACINTROU, AMIKACIN in the last 72 hours.   Microbiology: Recent Results (from the past 720 hour(s))  Clostridium Difficile by PCR     Status: None   Collection Time: 01/12/15  4:44 PM  Result Value Ref Range Status   C difficile by pcr NEGATIVE NEGATIVE Final  MRSA PCR Screening     Status: None   Collection Time: 01/25/15  5:41 PM  Result Value Ref Range Status   MRSA by PCR NEGATIVE NEGATIVE Final    Comment:        The GeneXpert MRSA Assay (FDA approved for NASAL specimens only), is one component of a comprehensive MRSA colonization surveillance program. It is not intended to diagnose MRSA infection nor to guide or monitor treatment for MRSA infections.   Blood culture  (routine x 2)     Status: None (Preliminary result)   Collection Time: 02/15/15  1:18 PM  Result Value Ref Range Status   Specimen Description LEFT ANTECUBITAL  Final   Special Requests BOTTLES DRAWN AEROBIC ONLY 8CC  Final   Culture NO GROWTH 3 DAYS  Final   Report Status PENDING  Incomplete  Urine culture     Status: None (Preliminary result)   Collection Time: 15-Feb-2015  2:20 PM  Result Value Ref Range Status   Specimen Description URINE, CATHETERIZED  Final   Special Requests NONE  Final   Colony Count   Final    >=100,000 COLONIES/ML Performed at [facility]    Culture   Final    ESCHERICHIA COLI GRAM NEGATIVE RODS Performed at [facility]    Report Status PENDING  Incomplete  Culture, Urine     Status: None   Collection Time: 15-Feb-2015  7:08 PM  Result Value Ref Range Status   Specimen Description URINE, CATHETERIZED  Final   Special Requests NONE  Final   Colony Count   Final    >=100,000 COLONIES/ML Performed at [facility]    Culture   Final    Multiple bacterial morphotypes present, none predominant. Suggest appropriate recollection if clinically indicated. Performed at [facility]    Report Status 02/06/2015 FINAL  Final   Assessment: 81 yo F transferred from [facility] from a rehab center with AMS.  She continues on azithromycin and cefepime  right now for r/o sepsis and possible UTI. WBC wnl, afebrile.  5/28 CXR persistent atelectasis vs infiltrate involving RLL w/ assoc R pleural effusion, smaller L pleural effusion present  Ceftriaxone 1g x 1 dose 5/28 at 1458 levaquin 500 mg IV x 1 dose 5/28 at 1710 Cefepime 5/28>> zyvox 5/28>>5/30 azith 5/29>>  5/28 @ 1420 UCx: >100K EColi 5/28 @ 1908 Ucx: >100K colonies but none predominant 5/28 BCx1: NGTD  SCr 1.6, est CrCl ~25-64mL/min. Appears to be stable right now, baseline looks to be 1-1.3 from labs earlier in the year.  Goal of Therapy:  Eradicate infection  Plan:  -cefepime  2 gm IV q24h -f/u renal fxn, c/s, clinical status -will narrow when UCx results  Shakiara Lukic D. Earleen Aoun, PharmD, BCPS Clinical Pharmacist Pager: [phone] 02/07/2015 11:38 AM

## 2015-02-07 NOTE — Clinical Social Work Note (Signed)
CSW received consult for social work, due to patient being from facility.  CSW did not have time to assess will complete in the morning.  Ervin Knack. Pihu Basil, MSW, [facility] [phone] 02/07/2015 5:34 PM

## 2015-02-08 DIAGNOSIS — N183 Chronic kidney disease, stage 3 (moderate): Secondary | ICD-10-CM

## 2015-02-08 DIAGNOSIS — I5021 Acute systolic (congestive) heart failure: Secondary | ICD-10-CM

## 2015-02-08 LAB — URINE CULTURE: Colony Count: >=100000

## 2015-02-08 LAB — CBC WITH DIFFERENTIAL/PLATELET
Basophils Absolute: 0.1 10*3/uL (ref 0.0–0.1)
Basophils Relative: 2 % — ABNORMAL HIGH (ref 0–1)
EOS ABS: 0.3 10*3/uL (ref 0.0–0.7)
Eosinophils Relative: 7 % — ABNORMAL HIGH (ref 0–5)
HEMATOCRIT: 32.7 % — AB (ref 36.0–46.0)
HEMOGLOBIN: 10.2 g/dL — AB (ref 12.0–15.0)
LYMPHS PCT: 16 % (ref 12–46)
Lymphs Abs: 0.7 10*3/uL (ref 0.7–4.0)
MCH: 32.4 pg (ref 26.0–34.0)
MCHC: 31.2 g/dL (ref 30.0–36.0)
MCV: 103.8 fL — ABNORMAL HIGH (ref 78.0–100.0)
MONO ABS: 0.9 10*3/uL (ref 0.1–1.0)
Monocytes Relative: 19 % — ABNORMAL HIGH (ref 3–12)
Neutro Abs: 2.5 10*3/uL (ref 1.7–7.7)
Neutrophils Relative %: 56 % (ref 43–77)
Platelets: 108 10*3/uL — ABNORMAL LOW (ref 150–400)
RBC: 3.15 MIL/uL — ABNORMAL LOW (ref 3.87–5.11)
RDW: 20.5 % — ABNORMAL HIGH (ref 11.5–15.5)
WBC: 4.5 10*3/uL (ref 4.0–10.5)

## 2015-02-08 LAB — BASIC METABOLIC PANEL
Anion gap: 10 (ref 5–15)
BUN: 31 mg/dL — ABNORMAL HIGH (ref 6–20)
CALCIUM: 8.9 mg/dL (ref 8.9–10.3)
CO2: 27 mmol/L (ref 22–32)
Chloride: 104 mmol/L (ref 101–111)
Creatinine, Ser: 1.63 mg/dL — ABNORMAL HIGH (ref 0.44–1.00)
GFR, EST AFRICAN AMERICAN: 33 mL/min — AB (ref >60)
GFR, EST NON AFRICAN AMERICAN: 28 mL/min — AB (ref >60)
GLUCOSE: 78 mg/dL (ref 65–99)
POTASSIUM: 4.1 mmol/L (ref 3.5–5.1)
Sodium: 141 mmol/L (ref 135–145)

## 2015-02-08 LAB — BRAIN NATRIURETIC PEPTIDE: B NATRIURETIC PEPTIDE 5: 1285.4 pg/mL — AB (ref 0.0–100.0)

## 2015-02-08 LAB — PROTIME-INR
INR: 2.11 — AB (ref 0.00–1.49)
PROTHROMBIN TIME: 23.5 s — AB (ref 11.6–15.2)

## 2015-02-08 LAB — PHOSPHORUS: Phosphorus: 4.1 mg/dL (ref 2.5–4.6)

## 2015-02-08 LAB — MAGNESIUM: MAGNESIUM: 2.4 mg/dL (ref 1.7–2.4)

## 2015-02-08 MED ORDER — CEPHALEXIN 500 MG PO CAPS
500.0000 mg | ORAL_CAPSULE | Freq: Three times a day (TID) | ORAL | Status: DC
  Administered 2015-02-08 – 2015-02-10 (×5): 500 mg via ORAL
  Filled 2015-02-08 (×5): qty 1

## 2015-02-08 MED ORDER — LACTULOSE 10 GM/15ML PO SOLN
10.0000 g | Freq: Two times a day (BID) | ORAL | Status: DC | PRN
  Administered 2015-02-09 – 2015-02-10 (×2): 10 g via ORAL
  Filled 2015-02-08 (×2): qty 15

## 2015-02-08 MED ORDER — LACTULOSE 10 GM/15ML PO SOLN: 10.0000 g | Freq: Two times a day (BID) | ORAL | Status: DC

## 2015-02-08 MED ORDER — PRO-STAT SUGAR FREE PO LIQD
30.0000 mL | Freq: Every day | ORAL | Status: DC
  Administered 2015-02-08 – 2015-02-12 (×4): 30 mL via ORAL
  Filled 2015-02-08 (×6): qty 30

## 2015-02-08 MED ORDER — FUROSEMIDE 10 MG/ML IJ SOLN
40.0000 mg | Freq: Two times a day (BID) | INTRAMUSCULAR | Status: DC
  Administered 2015-02-08 – 2015-02-15 (×16): 40 mg via INTRAVENOUS
  Filled 2015-02-08 (×16): qty 4

## 2015-02-08 NOTE — Care Management Note (Addendum)
Case Management Note  Patient Details  Name: MALIJAH RUBERT MRN: [id number] Date of Birth: December 15, 1931  Subjective/Objective:                    Action/Plan: Spoke to patient at bedside , she consent for NCM to call her daughter Pete Glatter [phone] .   Spoke to Ms Clearance Coots she is interested in using [facility] [phone] . NCM called [facility] PT will not be able to see patient until February 16, 2015 . Ms Clearance Coots aware and would still like services through [facility].   Requesting hospital bed .   Will need MD orders for hospital bed and home health RN/PT/OT/aide and SW .   Patient discharging to [address] , [place] [address]     Expected Discharge Date:                  Expected Discharge Plan:  Home w Home Health Services  In-House Referral:     Discharge planning Services     Post Acute Care Choice:    Choice offered to:     DME Arranged:    DME Agency:     HH Arranged:    HH Agency:     Status of Service:  In process, will continue to follow  Medicare Important Message Given:  Yes Date Medicare IM Given:  02/07/15 Medicare IM give by:  Avie Arenas, RNBSN Date Additional Medicare IM Given:    Additional Medicare Important Message give by:     If discussed at Long Length of Stay Meetings, dates discussed:    Additional Comments:  Kingsley Plan, RN 02/08/2015, 11:09 AM

## 2015-02-08 NOTE — Progress Notes (Signed)
Nutrition Follow-up  DOCUMENTATION CODES:  Morbid obesity  INTERVENTION:  -MVI daily -30 ml Prostat daily, each supplement provides 100 kcals and 15 grams protein  NUTRITION DIAGNOSIS:  Increased nutrient needs related to wound healing as evidenced by estimated needs.  Ongoing  GOAL:  Patient will meet greater than or equal to 90% of their needs  Progressing  MONITOR:  PO intake, Supplement acceptance, Labs, Weight trends, I & O's, Skin  REASON FOR ASSESSMENT:  Malnutrition Screening Tool    ASSESSMENT: 76 F with CHF (EF 40-45%), possible small vessel CAD (clean cath 4/22), DMII who presented to [facility] as a transfer from a rehab center for AMS. Likely 2/2 acute delirium 2/2 UTI.  Pt has been transferred from ICU to surgical floor.   She reports she has been feeling better and continues to have a good appetite. She has been advanced to a heart healthy diet and wants to ensure she will continue on this diet. Pt daughter at bedside reports that she consumed all of her lunch yesterday and breakfast this morning. Meal completion 80%.   Pt complains her throat is sore from NGT removal, however, this is improving.   Discussed importance of good PO intake to promote healing. RD will add Prostat and MVI to help facilitate wound healing. Latest Hgb A1c reveals optimal control.   RD also took additional food preferences from pt and communicated updates with nutritional services staff.   Height:  Ht Readings from Last 1 Encounters:  02/05/15 5\' 1"  (1.549 m)    Weight:  Wt Readings from Last 1 Encounters:  02/08/15 241 lb 10 oz (109.6 kg)    Ideal Body Weight:  47.7 kg  Wt Readings from Last 10 Encounters:  02/08/15 241 lb 10 oz (109.6 kg)  01/26/15 210 lb 3.2 oz (95.346 kg)  12/30/14 230 lb 9.6 oz (104.599 kg)  12/29/13 193 lb 5.5 oz (87.7 kg)    BMI:  Body mass index is 45.68 kg/(m^2).  Estimated Nutritional Needs:  Kcal:  1700-1900  Protein:  90-100  gm  Fluid:  1.7-1.9 L  Skin:  Wound (see comment) (DM ulcer on lt lower leg)  Diet Order:  Diet Heart Room service appropriate?: Yes; Fluid consistency:: Thin  EDUCATION NEEDS:  Education needs addressed   Intake/Output Summary (Last 24 hours) at 02/08/15 1024 Last data filed at 02/08/15 0450  Gross per 24 hour  Intake    235 ml  Output   1350 ml  Net  -1115 ml    Last BM:  02/06/15  Jadea Shiffer A. Mayford Knife, RD, LDN, CDE Pager: [phone] After hours Pager: [phone]

## 2015-02-08 NOTE — Care Management (Addendum)
[facility] PT will not be able to patient now until 02/17/2015 .   Jaquita Folds , and [place] , [facility] , [facility] , [facility] all do not service [place]    Able to order hospital bed through [facility] phone [phone] , fax [phone]   Ronny Flurry RN BSN   Spoke to Ms Clearance Coots explained above . She would like to use [facility] phone [phone] fax [phone], knowing they do not provide social worker , she can go to local DSS office for assistance  in [place] .   Orders faxed to [facility].   Ronny Flurry RN BSN

## 2015-02-08 NOTE — Care Management Note (Signed)
Case Management Note  Patient Details  Name: Melanie Camacho MRN: [id number] Date of Birth: 11-25-1931  Subjective/Objective:                    Action/Plan:   Expected Discharge Date:                  Expected Discharge Plan:  Home w Home Health Services  In-House Referral:     Discharge planning Services     Post Acute Care Choice:    Choice offered to:     DME Arranged:    DME Agency:     HH Arranged:    HH Agency:     Status of Service:  In process, will continue to follow  Medicare Important Message Given:  Yes Date Medicare IM Given:  02/07/15 Medicare IM give by:  Avie Arenas, RNBSN Date Additional Medicare IM Given:    Additional Medicare Important Message give by:     If discussed at Long Length of Stay Meetings, dates discussed:  02/07/2015  Additional Comments:  Yvone Neu, RN 02/08/2015, 9:23 AM

## 2015-02-08 NOTE — Clinical Social Work Note (Signed)
CSW received referral for SNF.  Case discussed with case manager and plan is to discharge home with home health and daughter.  CSW to sign off please re-consult if social work needs arise.  Ervin Knack. Jazmene Racz, MSW, [employer] [phone]

## 2015-02-08 NOTE — Progress Notes (Addendum)
[facility] Progress Note   Melanie Camacho ZOX:[id number] DOB: 1931/11/09 DOA: 01/10/2015 PCP: PROVIDER NOT IN SYSTEM  Brief narrative: Melanie Camacho is a 81 y.o. female with CHF (EF 40-45%), possible small vessel CAD (clean cath 4/22), DMII who presented to [facility] as a transfer from a rehab center for AMS. Likely 2/2 acute delirium 2/2 UTI. Found to have septic shock and admitted to ICU. Started on pressors and stress dose steroids.  The patient was hospitalized from 4/28 through 5/24 acute on chronic diastolic heart failure.   Subjective: Patient evaluated earlier this morning on rounds. She had no complaints of cough, shortness of breath, nausea, vomiting, abdominal pain, diarrhea or dysuria.  Assessment/Plan: Principal Problem:   Septic shock/UTI -Urine culture reveals Escherichia coli -Anti-biotics started on 5/28-will narrow to Keflex based on sensitivities for a seven-day course last dose will be on the evening of 6/3  Active Problems: Acute encephalopathy on admission -Likely secondary to above and now resolved  Acute respiratory failure-acute on chronic systolic heart failure -EF 40-45% -Chest x-ray reveals bilateral effusions and right basilar infiltrate versus atelectasis -Physical exam reveals edema of the anterior abdominal wall and her arms -BMP 1285 today-was 840 on 5/28 -Weight when discharged was down to about 95 kg-she is 100.8 kg today -He was initially suspected to have a pneumonia however she does not have complaints of increased cough and therefore will narrow anabiotic's to treat for UTI as mentioned above -Suspect she is fluid overloaded and therefore will increase diuretics  Mildly elevated troponin -On 5/28 troponin was noted to be 0.04>0.05 > 0.04> 0.06 -Likely secondary to acute infection/septic shock-no further management  Recent shingles -left upper back-completed treatment with valacyclovir   Chronic a-fib - cont Eliquis  Hypotension -Holding  bisoprolol  Diabetes mellitus -Diet controlled-check CBGs  Hypothyroidism -Continue levothyroxin    S/P AVR-(tissue) 2011 at [facility] - no CABG with this    Obesity- BMI 45     CKD (chronic kidney disease), stage III -Baseline creatinine is about 1.2-creatinine slightly elevated today-continue to follow    Code Status: DO NOT RESUSCITATE Family Communication:  Disposition Plan: Patient refusing to go to skilled nursing-will go home with home health DVT prophylaxis: Eliquis Consultants: Pulmonary critical care Procedures: Echo 5/17 Left ventricle: The cavity size was normal. Systolic function was mildly to moderately reduced. The estimated ejection fraction was in the range of 40% to 45%. Regional wall motion abnormalities cannot be excluded. - Aortic valve: There was mild regurgitation. - Mitral valve: Calcified annulus. Mildly thickened leaflets . There was mild regurgitation. - Right ventricle: The cavity size was mildly dilated. Systolic function was mildly reduced. - Right atrium: The atrium was mildly dilated. - Pulmonary arteries: PA peak pressure: 32 mm Hg (S). - Pericardium, extracardiac: A trivial pericardial effusion was identified.   Antibiotics: Anti-infectives    Start     Dose/Rate Route Frequency Ordered Stop   02/05/15 1800  azithromycin (ZITHROMAX) 250 mg in dextrose 5 % 125 mL IVPB  Status:  Discontinued     250 mg 125 mL/hr over 60 Minutes Intravenous Every 24 hours 01/31/2015 2022 02/07/15 1148   02/07/2015 2200  linezolid (ZYVOX) IVPB 600 mg  Status:  Discontinued     600 mg 300 mL/hr over 60 Minutes Intravenous Every 12 hours 02/03/2015 2022 02/06/15 1224   01/13/2015 2200  ceFEPIme (MAXIPIME) 2 g in dextrose 5 % 50 mL IVPB     2 g 100 mL/hr over 30 Minutes Intravenous Every  24 hours 02/06/2015 2057     01/15/2015 1630  linezolid (ZYVOX) IVPB 600 mg  Status:  Discontinued     600 mg 300 mL/hr over 60 Minutes Intravenous  Once 01/16/2015 1620  01/30/2015 2023   01/18/2015 1630  levofloxacin (LEVAQUIN) IVPB 500 mg     500 mg 100 mL/hr over 60 Minutes Intravenous  Once 01/16/2015 1620 01/16/2015 1810   01/23/2015 1500  cefTRIAXone (ROCEPHIN) 1 g in dextrose 5 % 50 mL IVPB     1 g 100 mL/hr over 30 Minutes Intravenous  Once 02/06/2015 1446 01/28/2015 1614      Objective: Filed Weights   02/07/15 0500 02/08/15 0450 02/08/15 1131  Weight: 98.6 kg (217 lb 6 oz) 109.6 kg (241 lb 10 oz) 100.88 kg (222 lb 6.4 oz)    Intake/Output Summary (Last 24 hours) at 02/08/15 1659 Last data filed at 02/08/15 0450  Gross per 24 hour  Intake      0 ml  Output   1175 ml  Net  -1175 ml     Vitals Filed Vitals:   02/08/15 1000 02/08/15 1005 02/08/15 1131 02/08/15 1426  BP:    98/54  Pulse:    64  Temp:    97.8 F (36.6 C)  TempSrc:    Oral  Resp:    17  Height:      Weight:   100.88 kg (222 lb 6.4 oz)   SpO2: 85% 100%  98%    Exam:  General:  Pt is alert, not in acute distress  HEENT: No icterus, No thrush, oral mucosa moist  Cardiovascular: regular rate and rhythm, S1/S2 No murmur  Respiratory: crackles b/l bases R> L-on 2 L of oxygen with oxygen saturation of 98%-on room air oxygen saturation is 85%  Abdomen: Soft, +Bowel sounds, non tender, non distended, no guarding + abdominal wall edema  MSK: No LE edema, cyanosis or clubbing  Data Reviewed: Basic Metabolic Panel:  Recent Labs Lab 01/14/2015 1320  01/25/2015 2130 02/05/15 0840 02/06/15 0205 02/07/15 0233 02/08/15 0524  NA  --   < > 140 141 142 139 141  K  --   < > 5.7* 5.7* 5.2* 4.4 4.1  CL  --   < > 104 105 106 106 104  CO2  --   --  GLUCOSE  --   < > 70 90 105* 102* 78  BUN  --   < > 38* 38* 37* 35* 31*  CREATININE  --   < > 1.41* 1.51* 1.60* 1.60* 1.63*  CALCIUM  --   --  8.3* 8.8* 8.7* 8.6* 8.9  MG 2.6*  --  2.6* 2.4  --   --  2.4  PHOS  --   --  3.7 3.6  --   --  4.1  < > = values in this interval not displayed. Liver Function Tests:  Recent  Labs Lab 02/03/2015 1318 01/08/2015 2130 02/07/15 0233  AST 34 28 34  ALT ALKPHOS 74 63 65  BILITOT 1.1 1.0 1.0  PROT 7.4 6.3* 6.7  ALBUMIN 3.2* 2.4* 2.6*    Recent Labs Lab 02/05/15 1917  LIPASE 13*  AMYLASE 22*    Recent Labs Lab 01/25/2015 1318  AMMONIA 53*   CBC:  Recent Labs Lab 01/24/2015 1318  01/27/2015 2130 02/05/15 0840 02/06/15 0205 02/07/15 0233 02/08/15 0524  WBC 3.4*  --  3.0* 3.6* 3.4* 4.5 4.5  NEUTROABS  2.1  --  1.9  --   --   --  2.5  HGB 10.3*  < > 9.1* 9.6* 9.9* 10.6* 10.2*  HCT 33.5*  < > 29.7* 31.1* 32.4* 35.4* 32.7*  MCV 105.0*  --  103.1* 103.0* 103.5* 106.3* 103.8*  PLT 141*  --  121* 131* 125* 102* 108*  < > = values in this interval not displayed. Cardiac Enzymes:  Recent Labs Lab 01/24/2015 1318 01/29/2015 2130 02/05/15 0230 02/05/15 0840  TROPONINI 0.04* 0.05* 0.04* 0.06*   BNP (last 3 results)  Recent Labs  01/05/15 1528 01/28/2015 1318 02/08/15 0524  BNP 972.1* 840.0* 1285.4*    ProBNP (last 3 results) No results for input(s): PROBNP in the last 8760 hours.  CBG:  Recent Labs Lab 02/06/15 2026 02/06/15 2344 02/07/15 0343 02/07/15 0817 02/07/15 1154  GLUCAP 162* 117* 101* 101* 79    Recent Results (from the past 240 hour(s))  Blood culture (routine x 2)     Status: None (Preliminary result)   Collection Time: 01/30/2015  1:18 PM  Result Value Ref Range Status   Specimen Description LEFT ANTECUBITAL  Final   Special Requests BOTTLES DRAWN AEROBIC ONLY 8CC  Final   Culture NO GROWTH 4 DAYS  Final   Report Status PENDING  Incomplete  Urine culture     Status: None   Collection Time: 01/09/2015  2:20 PM  Result Value Ref Range Status   Specimen Description URINE, CATHETERIZED  Final   Special Requests NONE  Final   Colony Count   Final    >=100,000 COLONIES/ML Performed at [facility]    Culture   Final    ESCHERICHIA COLI KLEBSIELLA PNEUMONIAE Performed at [facility]    Report Status  02/08/2015 FINAL  Final   Organism ID, Bacteria ESCHERICHIA COLI  Final   Organism ID, Bacteria KLEBSIELLA PNEUMONIAE  Final      Susceptibility   Escherichia coli - MIC*    AMPICILLIN >=32 RESISTANT Resistant     CEFAZOLIN <=4 SENSITIVE Sensitive     CEFTRIAXONE <=1 SENSITIVE Sensitive     CIPROFLOXACIN >=4 RESISTANT Resistant     GENTAMICIN >=16 RESISTANT Resistant     LEVOFLOXACIN >=8 RESISTANT Resistant     NITROFURANTOIN <=16 SENSITIVE Sensitive     TOBRAMYCIN >=16 RESISTANT Resistant     TRIMETH/SULFA <=20 SENSITIVE Sensitive     PIP/TAZO 8 SENSITIVE Sensitive     * ESCHERICHIA COLI   Klebsiella pneumoniae - MIC*    AMPICILLIN RESISTANT      CEFAZOLIN <=4 SENSITIVE Sensitive     CEFTRIAXONE <=1 SENSITIVE Sensitive     CIPROFLOXACIN <=0.25 SENSITIVE Sensitive     GENTAMICIN <=1 SENSITIVE Sensitive     LEVOFLOXACIN <=0.12 SENSITIVE Sensitive     NITROFURANTOIN 64 INTERMEDIATE Intermediate     TOBRAMYCIN <=1 SENSITIVE Sensitive     TRIMETH/SULFA <=20 SENSITIVE Sensitive     PIP/TAZO <=4 SENSITIVE Sensitive     * KLEBSIELLA PNEUMONIAE  Culture, Urine     Status: None   Collection Time: 01/27/2015  7:08 PM  Result Value Ref Range Status   Specimen Description URINE, CATHETERIZED  Final   Special Requests NONE  Final   Colony Count   Final    >=100,000 COLONIES/ML Performed at [facility]    Culture   Final    Multiple bacterial morphotypes present, none predominant. Suggest appropriate recollection if clinically indicated. Performed at [facility]    Report  Status 02/06/2015 FINAL  Final     Studies: No results found.  Scheduled Meds:  Scheduled Meds: . antiseptic oral rinse  7 mL Mouth Rinse q12n4p  . apixaban  2.5 mg Oral BID  . ceFEPime (MAXIPIME) IV  2 g Intravenous Q24H  . chlorhexidine  15 mL Mouth Rinse BID  . cholecalciferol  5,000 Units Oral Daily  . colesevelam  1,875 mg Oral BID WC  . feeding supplement (PRO-STAT SUGAR FREE 64)  30  mL Oral Daily  . furosemide  40 mg Intravenous BID  . levothyroxine  50 mcg Oral QAC breakfast  . multivitamin  1 tablet Oral Daily  . pantoprazole  40 mg Oral QHS   Continuous Infusions:   Time spent on care of this patient: 35 min   Lindalee Huizinga, MD 02/08/2015, 4:59 PM  LOS: 4 days   [facility] Office  [phone] Pager - Text Page per [email or web address] If 7PM-7AM, please contact night-coverage [email or web address]

## 2015-02-08 DEATH — deceased

## 2015-02-09 LAB — CBC WITH DIFFERENTIAL/PLATELET
Basophils Absolute: 0 10*3/uL (ref 0.0–0.1)
Basophils Relative: 1 % (ref 0–1)
EOS PCT: 7 % — AB (ref 0–5)
Eosinophils Absolute: 0.3 10*3/uL (ref 0.0–0.7)
HCT: 36.4 % (ref 36.0–46.0)
HEMOGLOBIN: 11.1 g/dL — AB (ref 12.0–15.0)
Lymphocytes Relative: 19 % (ref 12–46)
Lymphs Abs: 0.7 10*3/uL (ref 0.7–4.0)
MCH: 32.4 pg (ref 26.0–34.0)
MCHC: 30.5 g/dL (ref 30.0–36.0)
MCV: 106.1 fL — ABNORMAL HIGH (ref 78.0–100.0)
Monocytes Absolute: 1 10*3/uL (ref 0.1–1.0)
Monocytes Relative: 27 % — ABNORMAL HIGH (ref 3–12)
Neutro Abs: 1.8 10*3/uL (ref 1.7–7.7)
Neutrophils Relative %: 47 % (ref 43–77)
Platelets: 83 10*3/uL — ABNORMAL LOW (ref 150–400)
RBC: 3.43 MIL/uL — ABNORMAL LOW (ref 3.87–5.11)
RDW: 20.3 % — ABNORMAL HIGH (ref 11.5–15.5)
WBC: 3.8 10*3/uL — AB (ref 4.0–10.5)

## 2015-02-09 LAB — BASIC METABOLIC PANEL
Anion gap: 13 (ref 5–15)
BUN: 34 mg/dL — ABNORMAL HIGH (ref 6–20)
CALCIUM: 8.7 mg/dL — AB (ref 8.9–10.3)
CO2: 25 mmol/L (ref 22–32)
Chloride: 102 mmol/L (ref 101–111)
Creatinine, Ser: 1.59 mg/dL — ABNORMAL HIGH (ref 0.44–1.00)
GFR calc Af Amer: 34 mL/min — ABNORMAL LOW (ref >60)
GFR calc non Af Amer: 29 mL/min — ABNORMAL LOW (ref >60)
GLUCOSE: 70 mg/dL (ref 65–99)
POTASSIUM: 4.1 mmol/L (ref 3.5–5.1)
Sodium: 140 mmol/L (ref 135–145)

## 2015-02-09 LAB — PHOSPHORUS: Phosphorus: 4.4 mg/dL (ref 2.5–4.6)

## 2015-02-09 LAB — MAGNESIUM: MAGNESIUM: 2.2 mg/dL (ref 1.7–2.4)

## 2015-02-09 LAB — CULTURE, BLOOD (ROUTINE X 2): CULTURE: NO GROWTH

## 2015-02-09 MED ORDER — ROPINIROLE HCL 0.25 MG PO TABS
0.2500 mg | ORAL_TABLET | Freq: Three times a day (TID) | ORAL | Status: DC
  Administered 2015-02-09: 2.5 mg via ORAL
  Administered 2015-02-09 – 2015-02-19 (×29): 0.25 mg via ORAL
  Filled 2015-02-09 (×31): qty 1

## 2015-02-09 MED ORDER — BISOPROLOL FUMARATE 5 MG PO TABS
5.0000 mg | ORAL_TABLET | Freq: Every day | ORAL | Status: DC
  Administered 2015-02-11 – 2015-02-15 (×5): 5 mg via ORAL
  Filled 2015-02-09 (×6): qty 1

## 2015-02-09 NOTE — Care Management Note (Signed)
Case Management Note  Patient Details  Name: Melanie Camacho MRN: [id number] Date of Birth: [birth year]  Subjective/Objective:                    Action/Plan: Updated [facility] and [facility]   Expected Discharge Date:      02-11-15             Expected Discharge Plan:  Home w Home Health Services  In-House Referral:     Discharge planning Services     Post Acute Care Choice:    Choice offered to:     DME Arranged:    DME Agency:     HH Arranged:    HH Agency:     Status of Service:  In process, will continue to follow  Medicare Important Message Given:  Yes Date Medicare IM Given:  02/07/15 Medicare IM give by:  Avie Arenas, RNBSN Date Additional Medicare IM Given:    Additional Medicare Important Message give by:     If discussed at Long Length of Stay Meetings, dates discussed:    Additional Comments:  Kingsley Plan, RN 02/09/2015, 1:12 PM

## 2015-02-09 NOTE — Progress Notes (Signed)
[facility] Progress Note   THAIS SILBERSTEIN ZOX:[id number] DOB: 01/07/1932 DOA: 01/23/2015 PCP: PROVIDER NOT IN SYSTEM  Brief narrative: Melanie Camacho is a 81 y.o. female with CHF (EF 40-45%), possible small vessel CAD (clean cath 4/22), DMII who presented to [facility] as a transfer from a rehab center for AMS. Likely 2/2 acute delirium 2/2 UTI. Found to have septic shock and admitted to ICU. Started on pressors and stress dose steroids.  The patient was hospitalized from 4/28 through 5/24 acute on chronic diastolic heart failure.   Subjective: No complaints of dyspnea, cough, chest pain, nausea, vomiting, or diarrhea.  Assessment/Plan: Principal Problem:   Septic shock/UTI -Urine culture reveals Escherichia coli -Anti-biotics started on 5/28- narrowed to Keflex based on sensitivities for a seven-day course last dose will be on the evening of 6/3  Active Problems: Acute encephalopathy on admission -Likely secondary to above and now resolved  Acute respiratory failure-acute on chronic systolic heart failure -EF 40-45% -Chest x-ray reveals bilateral effusions and right basilar infiltrate versus atelectasis -Physical exam reveals edema of the anterior abdominal wall and her arms -BMP 1285 on 6/1-was 840 on 5/28 -Weight when discharged was down to about 95 kg- weight is 105 kg today -continue to diurese with IV Lasix- although she was negative balance of 1200 cc yesterday, she is still in overall positive balance since being admitted to the hospital  Mildly elevated troponin -On 5/28 troponin was noted to be 0.04>0.05 > 0.04> 0.06 -Likely secondary to acute infection/septic shock-no further management  PAF - cont Eliquis   Recent shingles -left upper back-completed treatment with valacyclovir    Chronic a-fib - cont Eliquis- will resume Bisoprolol to prevent rapid heart rate  Hypotension -follow carefully- resuming Bisoprolol  Diabetes mellitus -Diet controlled-follow  CBGs  Hypothyroidism -Continue levothyroxine    S/P AVR-(tissue) 2011 at [facility] - no CABG with this    Obesity- BMI 45     CKD (chronic kidney disease), stage III -Baseline creatinine is about 1.2--continue to follow with diuresis   Code Status: DO NOT RESUSCITATE Family Communication:  Disposition Plan: Patient refusing to go to skilled nursing-will go home with daughter with home health DVT prophylaxis: Eliquis Consultants: Pulmonary critical care Procedures: Echo 5/17 Left ventricle: The cavity size was normal. Systolic function was mildly to moderately reduced. The estimated ejection fraction was in the range of 40% to 45%. Regional wall motion abnormalities cannot be excluded. - Aortic valve: There was mild regurgitation. - Mitral valve: Calcified annulus. Mildly thickened leaflets . There was mild regurgitation. - Right ventricle: The cavity size was mildly dilated. Systolic function was mildly reduced. - Right atrium: The atrium was mildly dilated. - Pulmonary arteries: PA peak pressure: 32 mm Hg (S). - Pericardium, extracardiac: A trivial pericardial effusion was identified.   Antibiotics: Anti-infectives    Start     Dose/Rate Route Frequency Ordered Stop   02/08/15 2000  cephALEXin (KEFLEX) capsule 500 mg     500 mg Oral Every 8 hours 02/08/15 1715     02/05/15 1800  azithromycin (ZITHROMAX) 250 mg in dextrose 5 % 125 mL IVPB  Status:  Discontinued     250 mg 125 mL/hr over 60 Minutes Intravenous Every 24 hours 02/03/2015 2022 02/07/15 1148   01/24/2015 2200  linezolid (ZYVOX) IVPB 600 mg  Status:  Discontinued     600 mg 300 mL/hr over 60 Minutes Intravenous Every 12 hours 01/20/2015 2022 02/06/15 1224   01/16/2015 2200  ceFEPIme (MAXIPIME) 2 g  in dextrose 5 % 50 mL IVPB  Status:  Discontinued     2 g 100 mL/hr over 30 Minutes Intravenous Every 24 hours 02/17/15 2057 02/08/15 1715   02-17-15 1630  linezolid (ZYVOX) IVPB 600 mg  Status:  Discontinued      600 mg 300 mL/hr over 60 Minutes Intravenous  Once 17-Feb-2015 1620 February 17, 2015 2023   Feb 17, 2015 1630  levofloxacin (LEVAQUIN) IVPB 500 mg     500 mg 100 mL/hr over 60 Minutes Intravenous  Once 2015-02-17 1620 02-17-2015 1810   02/17/15 1500  cefTRIAXone (ROCEPHIN) 1 g in dextrose 5 % 50 mL IVPB     1 g 100 mL/hr over 30 Minutes Intravenous  Once 2015/02/17 1446 February 17, 2015 1614      Objective: Filed Weights   02/08/15 0450 02/08/15 1131 02/09/15 0529  Weight: 109.6 kg (241 lb 10 oz) 100.88 kg (222 lb 6.4 oz) 105 kg (231 lb 7.7 oz)    Intake/Output Summary (Last 24 hours) at 02/09/15 1439 Last data filed at 02/09/15 1300  Gross per 24 hour  Intake    580 ml  Output   2350 ml  Net  -1770 ml     Vitals Filed Vitals:   02/08/15 1426 02/08/15 2259 02/09/15 0529 02/09/15 1300  BP: 98/54 108/59 92/58 105/48  Pulse: 64 63 55 88  Temp: 97.8 F (36.6 C) 97.5 F (36.4 C) 97.4 F (36.3 C) 97.6 F (36.4 C)  TempSrc: Oral Oral Axillary Axillary  Resp: 17 19 17 18   Height:      Weight:   105 kg (231 lb 7.7 oz)   SpO2: 98% 100% 100% 100%    Exam:  General:  Pt is alert, not in acute distress  HEENT: No icterus, No thrush, oral mucosa moist  Cardiovascular: regular rate and rhythm, S1/S2 No murmur  Respiratory: crackles b/l bases R> L-on 2 L of oxygen with oxygen saturation of 98%-on room air oxygen saturation is 85%  Abdomen: Soft, +Bowel sounds, non tender, non distended, no guarding + abdominal wall edema  MSK: No LE edema, cyanosis or clubbing  Data Reviewed: Basic Metabolic Panel:  Recent Labs Lab 02-17-15 1320  02-17-2015 2130 02/05/15 0840 02/06/15 0205 02/07/15 0233 02/08/15 0524 02/09/15 0428  NA  --   < > 140 141 142 139 141 140  K  --   < > 5.7* 5.7* 5.2* 4.4 4.1 4.1  CL  --   < > 104 105 106 106 104 102  CO2  --   --  30 27 27 25 27 25   GLUCOSE  --   < > 70 90 105* 102* 78 70  BUN  --   < > 38* 38* 37* 35* 31* 34*  CREATININE  --   < > 1.41* 1.51* 1.60* 1.60* 1.63*  1.59*  CALCIUM  --   --  8.3* 8.8* 8.7* 8.6* 8.9 8.7*  MG 2.6*  --  2.6* 2.4  --   --  2.4 2.2  PHOS  --   --  3.7 3.6  --   --  4.1 4.4  < > = values in this interval not displayed. Liver Function Tests:  Recent Labs Lab 17-Feb-2015 1318 02/17/15 2130 02/07/15 0233  AST 34 28 34  ALT 16 14 16   ALKPHOS 74 63 65  BILITOT 1.1 1.0 1.0  PROT 7.4 6.3* 6.7  ALBUMIN 3.2* 2.4* 2.6*    Recent Labs Lab 02/05/15 1917  LIPASE 13*  AMYLASE 22*  Recent Labs Lab 02/20/2015 1318  AMMONIA 53*   CBC:  Recent Labs Lab 02-20-15 1318  2015-02-20 2130 02/05/15 0840 02/06/15 0205 02/07/15 0233 02/08/15 0524 02/09/15 0428  WBC 3.4*  --  3.0* 3.6* 3.4* 4.5 4.5 3.8*  NEUTROABS 2.1  --  1.9  --   --   --  2.5 1.8  HGB 10.3*  < > 9.1* 9.6* 9.9* 10.6* 10.2* 11.1*  HCT 33.5*  < > 29.7* 31.1* 32.4* 35.4* 32.7* 36.4  MCV 105.0*  --  103.1* 103.0* 103.5* 106.3* 103.8* 106.1*  PLT 141*  --  121* 131* 125* 102* 108* 83*  < > = values in this interval not displayed. Cardiac Enzymes:  Recent Labs Lab 02/20/2015 1318 20-Feb-2015 2130 02/05/15 0230 02/05/15 0840  TROPONINI 0.04* 0.05* 0.04* 0.06*   BNP (last 3 results)  Recent Labs  01/05/15 1528 02/20/15 1318 02/08/15 0524  BNP 972.1* 840.0* 1285.4*    ProBNP (last 3 results) No results for input(s): PROBNP in the last 8760 hours.  CBG:  Recent Labs Lab 02/06/15 2026 02/06/15 2344 02/07/15 0343 02/07/15 0817 02/07/15 1154  GLUCAP 162* 117* 101* 101* 79    Recent Results (from the past 240 hour(s))  Blood culture (routine x 2)     Status: None   Collection Time: 02-20-2015  1:18 PM  Result Value Ref Range Status   Specimen Description LEFT ANTECUBITAL  Final   Special Requests BOTTLES DRAWN AEROBIC ONLY 8CC  Final   Culture NO GROWTH 5 DAYS  Final   Report Status 02/09/2015 FINAL  Final  Urine culture     Status: None   Collection Time: 02-20-2015  2:20 PM  Result Value Ref Range Status   Specimen Description URINE,  CATHETERIZED  Final   Special Requests NONE  Final   Colony Count   Final    >=100,000 COLONIES/ML Performed at [facility]    Culture   Final    ESCHERICHIA COLI KLEBSIELLA PNEUMONIAE Performed at [facility]    Report Status 02/08/2015 FINAL  Final   Organism ID, Bacteria ESCHERICHIA COLI  Final   Organism ID, Bacteria KLEBSIELLA PNEUMONIAE  Final      Susceptibility   Escherichia coli - MIC*    AMPICILLIN >=32 RESISTANT Resistant     CEFAZOLIN <=4 SENSITIVE Sensitive     CEFTRIAXONE <=1 SENSITIVE Sensitive     CIPROFLOXACIN >=4 RESISTANT Resistant     GENTAMICIN >=16 RESISTANT Resistant     LEVOFLOXACIN >=8 RESISTANT Resistant     NITROFURANTOIN <=16 SENSITIVE Sensitive     TOBRAMYCIN >=16 RESISTANT Resistant     TRIMETH/SULFA <=20 SENSITIVE Sensitive     PIP/TAZO 8 SENSITIVE Sensitive     * ESCHERICHIA COLI   Klebsiella pneumoniae - MIC*    AMPICILLIN RESISTANT      CEFAZOLIN <=4 SENSITIVE Sensitive     CEFTRIAXONE <=1 SENSITIVE Sensitive     CIPROFLOXACIN <=0.25 SENSITIVE Sensitive     GENTAMICIN <=1 SENSITIVE Sensitive     LEVOFLOXACIN <=0.12 SENSITIVE Sensitive     NITROFURANTOIN 64 INTERMEDIATE Intermediate     TOBRAMYCIN <=1 SENSITIVE Sensitive     TRIMETH/SULFA <=20 SENSITIVE Sensitive     PIP/TAZO <=4 SENSITIVE Sensitive     * KLEBSIELLA PNEUMONIAE  Culture, Urine     Status: None   Collection Time: 2015-02-20  7:08 PM  Result Value Ref Range Status   Specimen Description URINE, CATHETERIZED  Final   Special Requests NONE  Final  Colony Count   Final    >=100,000 COLONIES/ML Performed at [facility]    Culture   Final    Multiple bacterial morphotypes present, none predominant. Suggest appropriate recollection if clinically indicated. Performed at [facility]    Report Status 02/06/2015 FINAL  Final     Studies: No results found.  Scheduled Meds:  Scheduled Meds: . apixaban  2.5 mg Oral BID  . cephALEXin  500  mg Oral Q8H  . cholecalciferol  5,000 Units Oral Daily  . colesevelam  1,875 mg Oral BID WC  . feeding supplement (PRO-STAT SUGAR FREE 64)  30 mL Oral Daily  . furosemide  40 mg Intravenous BID  . levothyroxine  50 mcg Oral QAC breakfast  . multivitamin  1 tablet Oral Daily  . pantoprazole  40 mg Oral QHS   Continuous Infusions:   Time spent on care of this patient: 35 min   Fred Hammes, MD 02/09/2015, 2:39 PM  LOS: 5 days   [facility] Office  [phone] Pager - Text Page per [email or web address] If 7PM-7AM, please contact night-coverage [email or web address]

## 2015-02-10 LAB — BRAIN NATRIURETIC PEPTIDE: B Natriuretic Peptide: 1210.7 pg/mL — ABNORMAL HIGH (ref 0.0–100.0)

## 2015-02-10 LAB — CBC WITH DIFFERENTIAL/PLATELET
Basophils Absolute: 0 10*3/uL (ref 0.0–0.1)
Basophils Relative: 1 % (ref 0–1)
EOS PCT: 7 % — AB (ref 0–5)
Eosinophils Absolute: 0.3 10*3/uL (ref 0.0–0.7)
HCT: 32 % — ABNORMAL LOW (ref 36.0–46.0)
Hemoglobin: 9.9 g/dL — ABNORMAL LOW (ref 12.0–15.0)
Lymphocytes Relative: 15 % (ref 12–46)
Lymphs Abs: 0.6 10*3/uL — ABNORMAL LOW (ref 0.7–4.0)
MCH: 32.6 pg (ref 26.0–34.0)
MCHC: 30.9 g/dL (ref 30.0–36.0)
MCV: 105.3 fL — ABNORMAL HIGH (ref 78.0–100.0)
MONOS PCT: 18 % — AB (ref 3–12)
Monocytes Absolute: 0.7 10*3/uL (ref 0.1–1.0)
NEUTROS ABS: 2.5 10*3/uL (ref 1.7–7.7)
NEUTROS PCT: 60 % (ref 43–77)
Platelets: 85 10*3/uL — ABNORMAL LOW (ref 150–400)
RBC: 3.04 MIL/uL — ABNORMAL LOW (ref 3.87–5.11)
RDW: 20.4 % — AB (ref 11.5–15.5)
WBC: 4.1 10*3/uL (ref 4.0–10.5)

## 2015-02-10 LAB — BASIC METABOLIC PANEL
Anion gap: 10 (ref 5–15)
BUN: 34 mg/dL — AB (ref 6–20)
CHLORIDE: 102 mmol/L (ref 101–111)
CO2: 28 mmol/L (ref 22–32)
Calcium: 8.8 mg/dL — ABNORMAL LOW (ref 8.9–10.3)
Creatinine, Ser: 1.38 mg/dL — ABNORMAL HIGH (ref 0.44–1.00)
GFR calc Af Amer: 40 mL/min — ABNORMAL LOW (ref >60)
GFR, EST NON AFRICAN AMERICAN: 35 mL/min — AB (ref >60)
Glucose, Bld: 63 mg/dL — ABNORMAL LOW (ref 65–99)
POTASSIUM: 4.3 mmol/L (ref 3.5–5.1)
Sodium: 140 mmol/L (ref 135–145)

## 2015-02-10 LAB — PHOSPHORUS: PHOSPHORUS: 4.3 mg/dL (ref 2.5–4.6)

## 2015-02-10 LAB — MAGNESIUM: Magnesium: 2.1 mg/dL (ref 1.7–2.4)

## 2015-02-10 MED ORDER — CEPHALEXIN 500 MG PO CAPS
500.0000 mg | ORAL_CAPSULE | Freq: Three times a day (TID) | ORAL | Status: AC
  Administered 2015-02-10: 500 mg via ORAL
  Filled 2015-02-10: qty 1

## 2015-02-10 MED ORDER — BISACODYL 10 MG RE SUPP
10.0000 mg | Freq: Once | RECTAL | Status: AC
  Administered 2015-02-10: 10 mg via RECTAL
  Filled 2015-02-10: qty 1

## 2015-02-10 NOTE — Progress Notes (Signed)
[facility] Progress Note   [name] NWG:[id number] DOB: May 14, 1932 DOA: 01/18/2015 PCP: PROVIDER NOT IN SYSTEM  Brief narrative: Melanie Camacho is a 81 y.o. female with CHF (EF 40-45%), possible small vessel CAD (clean cath 4/22), DMII who presented to [facility] as a transfer from a rehab center for AMS. Likely 2/2 acute delirium 2/2 UTI. Found to have septic shock and admitted to ICU. Started on pressors and stress dose steroids.  The patient was hospitalized from 4/28 through 5/24 acute on chronic diastolic heart failure.  Subjective: No complaints of cough, shortness of breath. Having some constipation. No abdominal pain or nausea.  Assessment/Plan: Principal Problem:   Septic shock/UTI -Urine culture reveals Escherichia coli -Antibiotics started on 5/28- narrowed to Keflex based on sensitivities for a seven-day course last dose will be today  Active Problems: Acute encephalopathy on admission -Likely secondary to above and now resolved  Acute respiratory failure-acute on chronic systolic heart failure -EF 40-45% -Chest x-ray reveals bilateral effusions and right basilar infiltrate versus atelectasis -Physical exam reveals edema of the anterior abdominal wall and her arms -BNP 1285 on 6/1-was 840 on 5/28 -Weight when discharged was down to about 95 kg-  -continue to diurese with IV Lasix- able to be weaned to 1.5 L O2 today   AKI on  CKD (chronic kidney disease), stage III -Baseline creatinine is about 1.2--Cr improving with diuresis  Mildly elevated troponin -On 5/28 troponin was noted to be 0.04>0.05 > 0.04> 0.06 -Likely secondary to acute infection/septic shock-no further management  PAF - cont Eliquis   Recent shingles -left upper back-completed treatment with valacyclovir    Chronic a-fib - cont Eliquis-  resumed Bisoprolol   Hypotension -follow carefully- resumed Bisoprolol as mentioned above  Diabetes mellitus -Diet controlled-follow  CBGs  Hypothyroidism -Continue levothyroxine    S/P AVR-(tissue) 2011 at [facility] - no CABG with this    Obesity- BMI 45   Code Status: DO NOT RESUSCITATE Family Communication:  Disposition Plan: Patient refusing to go to skilled nursing-will go home with daughter with home health DVT prophylaxis: Eliquis Consultants: Pulmonary critical care Procedures: Echo 5/17 Left ventricle: The cavity size was normal. Systolic function was mildly to moderately reduced. The estimated ejection fraction was in the range of 40% to 45%. Regional wall motion abnormalities cannot be excluded. - Aortic valve: There was mild regurgitation. - Mitral valve: Calcified annulus. Mildly thickened leaflets . There was mild regurgitation. - Right ventricle: The cavity size was mildly dilated. Systolic function was mildly reduced. - Right atrium: The atrium was mildly dilated. - Pulmonary arteries: PA peak pressure: 32 mm Hg (S). - Pericardium, extracardiac: A trivial pericardial effusion was identified.   Antibiotics: Anti-infectives    Start     Dose/Rate Route Frequency Ordered Stop   02/08/15 2000  cephALEXin (KEFLEX) capsule 500 mg     500 mg Oral Every 8 hours 02/08/15 1715     02/05/15 1800  azithromycin (ZITHROMAX) 250 mg in dextrose 5 % 125 mL IVPB  Status:  Discontinued     250 mg 125 mL/hr over 60 Minutes Intravenous Every 24 hours 01/24/2015 2022 02/07/15 1148   01/28/2015 2200  linezolid (ZYVOX) IVPB 600 mg  Status:  Discontinued     600 mg 300 mL/hr over 60 Minutes Intravenous Every 12 hours 01/10/2015 2022 02/06/15 1224   01/20/2015 2200  ceFEPIme (MAXIPIME) 2 g in dextrose 5 % 50 mL IVPB  Status:  Discontinued     2 g 100 mL/hr  over 30 Minutes Intravenous Every 24 hours 01/31/2015 2057 02/08/15 1715   02/07/2015 1630  linezolid (ZYVOX) IVPB 600 mg  Status:  Discontinued     600 mg 300 mL/hr over 60 Minutes Intravenous  Once 01/23/2015 1620 01/28/2015 2023   02/03/2015 1630  levofloxacin  (LEVAQUIN) IVPB 500 mg     500 mg 100 mL/hr over 60 Minutes Intravenous  Once 01/16/2015 1620 02/07/2015 1810   01/26/2015 1500  cefTRIAXone (ROCEPHIN) 1 g in dextrose 5 % 50 mL IVPB     1 g 100 mL/hr over 30 Minutes Intravenous  Once 02/01/2015 1446 01/08/2015 1614      Objective: Filed Weights   02/08/15 1131 02/09/15 0529 02/10/15 0508  Weight: 100.88 kg (222 lb 6.4 oz) 105 kg (231 lb 7.7 oz) 104.7 kg (230 lb 13.2 oz)    Intake/Output Summary (Last 24 hours) at 02/10/15 1202 Last data filed at 02/10/15 1610  Gross per 24 hour  Intake    240 ml  Output   2525 ml  Net  -2285 ml     Vitals Filed Vitals:   02/09/15 1300 02/09/15 2100 02/10/15 0508 02/10/15 1030  BP: 105/48 98/55 111/58   Pulse: 88 88 75   Temp: 97.6 F (36.4 C) 97.5 F (36.4 C) 97.5 F (36.4 C)   TempSrc: Axillary Oral Oral   Resp: Height:      Weight:   104.7 kg (230 lb 13.2 oz)   SpO2: 100% 100% 100% 95%    Exam:  General:  Pt is alert, not in acute distress  HEENT: No icterus, No thrush, oral mucosa moist  Cardiovascular: regular rate and rhythm, S1/S2 No murmur  Respiratory: crackles b/l bases R> L-  Abdomen: Soft, +Bowel sounds, non tender, non distended, no guarding + abdominal wall edema  MSK: No LE edema, cyanosis or clubbing  Data Reviewed: Basic Metabolic Panel:  Recent Labs Lab 01/08/2015 2130 02/05/15 0840 02/06/15 0205 02/07/15 0233 02/08/15 0524 02/09/15 0428 02/10/15 0534  NA 140 141 142 139 141 140 140  K 5.7* 5.7* 5.2* 4.4 4.1 4.1 4.3  CL 104 105 106 106 104 102 102  CO2 GLUCOSE 70 90 105* 102* 78 70 63*  BUN 38* 38* 37* 35* 31* 34* 34*  CREATININE 1.41* 1.51* 1.60* 1.60* 1.63* 1.59* 1.38*  CALCIUM 8.3* 8.8* 8.7* 8.6* 8.9 8.7* 8.8*  MG 2.6* 2.4  --   --  2.4 2.2 2.1  PHOS 3.7 3.6  --   --  4.1 4.4 4.3   Liver Function Tests:  Recent Labs Lab 01/08/2015 1318 01/31/2015 2130 02/07/15 0233  AST 34 28 34  ALT ALKPHOS 74 63 65   BILITOT 1.1 1.0 1.0  PROT 7.4 6.3* 6.7  ALBUMIN 3.2* 2.4* 2.6*    Recent Labs Lab 02/05/15 1917  LIPASE 13*  AMYLASE 22*    Recent Labs Lab 01/31/2015 1318  AMMONIA 53*   CBC:  Recent Labs Lab 01/14/2015 1318  01/17/2015 2130  02/06/15 0205 02/07/15 0233 02/08/15 0524 02/09/15 0428 02/10/15 0534  WBC 3.4*  --  3.0*  < > 3.4* 4.5 4.5 3.8* 4.1  NEUTROABS 2.1  --  1.9  --   --   --  2.5 1.8 2.5  HGB 10.3*  < > 9.1*  < > 9.9* 10.6* 10.2* 11.1* 9.9*  HCT 33.5*  < > 29.7*  < > 32.4* 35.4*  32.7* 36.4 32.0*  MCV 105.0*  --  103.1*  < > 103.5* 106.3* 103.8* 106.1* 105.3*  PLT 141*  --  121*  < > 125* 102* 108* 83* 85*  < > = values in this interval not displayed. Cardiac Enzymes:  Recent Labs Lab 02-15-2015 1318 2015/02/15 2130 02/05/15 0230 02/05/15 0840  TROPONINI 0.04* 0.05* 0.04* 0.06*   BNP (last 3 results)  Recent Labs  02/15/2015 1318 02/08/15 0524 02/10/15 0534  BNP 840.0* 1285.4* 1210.7*    ProBNP (last 3 results) No results for input(s): PROBNP in the last 8760 hours.  CBG:  Recent Labs Lab 02/06/15 2026 02/06/15 2344 02/07/15 0343 02/07/15 0817 02/07/15 1154  GLUCAP 162* 117* 101* 101* 79    Recent Results (from the past 240 hour(s))  Blood culture (routine x 2)     Status: None   Collection Time: 2015/02/15  1:18 PM  Result Value Ref Range Status   Specimen Description LEFT ANTECUBITAL  Final   Special Requests BOTTLES DRAWN AEROBIC ONLY 8CC  Final   Culture NO GROWTH 5 DAYS  Final   Report Status 02/09/2015 FINAL  Final  Urine culture     Status: None   Collection Time: 2015/02/15  2:20 PM  Result Value Ref Range Status   Specimen Description URINE, CATHETERIZED  Final   Special Requests NONE  Final   Colony Count   Final    >=100,000 COLONIES/ML Performed at [facility]    Culture   Final    ESCHERICHIA COLI KLEBSIELLA PNEUMONIAE Performed at [facility]    Report Status 02/08/2015 FINAL  Final   Organism ID,  Bacteria ESCHERICHIA COLI  Final   Organism ID, Bacteria KLEBSIELLA PNEUMONIAE  Final      Susceptibility   Escherichia coli - MIC*    AMPICILLIN >=32 RESISTANT Resistant     CEFAZOLIN <=4 SENSITIVE Sensitive     CEFTRIAXONE <=1 SENSITIVE Sensitive     CIPROFLOXACIN >=4 RESISTANT Resistant     GENTAMICIN >=16 RESISTANT Resistant     LEVOFLOXACIN >=8 RESISTANT Resistant     NITROFURANTOIN <=16 SENSITIVE Sensitive     TOBRAMYCIN >=16 RESISTANT Resistant     TRIMETH/SULFA <=20 SENSITIVE Sensitive     PIP/TAZO 8 SENSITIVE Sensitive     * ESCHERICHIA COLI   Klebsiella pneumoniae - MIC*    AMPICILLIN RESISTANT      CEFAZOLIN <=4 SENSITIVE Sensitive     CEFTRIAXONE <=1 SENSITIVE Sensitive     CIPROFLOXACIN <=0.25 SENSITIVE Sensitive     GENTAMICIN <=1 SENSITIVE Sensitive     LEVOFLOXACIN <=0.12 SENSITIVE Sensitive     NITROFURANTOIN 64 INTERMEDIATE Intermediate     TOBRAMYCIN <=1 SENSITIVE Sensitive     TRIMETH/SULFA <=20 SENSITIVE Sensitive     PIP/TAZO <=4 SENSITIVE Sensitive     * KLEBSIELLA PNEUMONIAE  Culture, Urine     Status: None   Collection Time: Feb 15, 2015  7:08 PM  Result Value Ref Range Status   Specimen Description URINE, CATHETERIZED  Final   Special Requests NONE  Final   Colony Count   Final    >=100,000 COLONIES/ML Performed at [facility]    Culture   Final    Multiple bacterial morphotypes present, none predominant. Suggest appropriate recollection if clinically indicated. Performed at [facility]    Report Status 02/06/2015 FINAL  Final     Studies: No results found.  Scheduled Meds:  Scheduled Meds: . apixaban  2.5 mg Oral BID  .  bisoprolol  5 mg Oral Daily  . cephALEXin  500 mg Oral Q8H  . cholecalciferol  5,000 Units Oral Daily  . colesevelam  1,875 mg Oral BID WC  . feeding supplement (PRO-STAT SUGAR FREE 64)  30 mL Oral Daily  . furosemide  40 mg Intravenous BID  . levothyroxine  50 mcg Oral QAC breakfast  . multivitamin  1  tablet Oral Daily  . pantoprazole  40 mg Oral QHS  . rOPINIRole  0.25 mg Oral TID   Continuous Infusions:   Time spent on care of this patient: 35 min   Latonda Larrivee, MD 02/10/2015, 12:02 PM  LOS: 6 days   [facility] Office  [phone] Pager - Text Page per [email or web address] If 7PM-7AM, please contact night-coverage [email or web address]

## 2015-02-11 DIAGNOSIS — D696 Thrombocytopenia, unspecified: Secondary | ICD-10-CM

## 2015-02-11 DIAGNOSIS — D638 Anemia in other chronic diseases classified elsewhere: Secondary | ICD-10-CM

## 2015-02-11 DIAGNOSIS — Z7901 Long term (current) use of anticoagulants: Secondary | ICD-10-CM

## 2015-02-11 DIAGNOSIS — I482 Chronic atrial fibrillation: Secondary | ICD-10-CM

## 2015-02-11 LAB — CBC WITH DIFFERENTIAL/PLATELET
BASOS PCT: 1 % (ref 0–1)
Basophils Absolute: 0 10*3/uL (ref 0.0–0.1)
EOS ABS: 0.3 10*3/uL (ref 0.0–0.7)
Eosinophils Relative: 6 % — ABNORMAL HIGH (ref 0–5)
HCT: 33.7 % — ABNORMAL LOW (ref 36.0–46.0)
HEMOGLOBIN: 10.4 g/dL — AB (ref 12.0–15.0)
Lymphocytes Relative: 12 % (ref 12–46)
Lymphs Abs: 0.6 10*3/uL — ABNORMAL LOW (ref 0.7–4.0)
MCH: 32.2 pg (ref 26.0–34.0)
MCHC: 30.9 g/dL (ref 30.0–36.0)
MCV: 104.3 fL — ABNORMAL HIGH (ref 78.0–100.0)
Monocytes Absolute: 0.7 10*3/uL (ref 0.1–1.0)
Monocytes Relative: 14 % — ABNORMAL HIGH (ref 3–12)
Neutro Abs: 3.3 10*3/uL (ref 1.7–7.7)
Neutrophils Relative %: 67 % (ref 43–77)
Platelets: 60 10*3/uL — ABNORMAL LOW (ref 150–400)
RBC: 3.23 MIL/uL — ABNORMAL LOW (ref 3.87–5.11)
RDW: 20.1 % — ABNORMAL HIGH (ref 11.5–15.5)
WBC: 5 10*3/uL (ref 4.0–10.5)

## 2015-02-11 LAB — BASIC METABOLIC PANEL
Anion gap: 12 (ref 5–15)
BUN: 32 mg/dL — AB (ref 6–20)
CHLORIDE: 102 mmol/L (ref 101–111)
CO2: 27 mmol/L (ref 22–32)
Calcium: 9 mg/dL (ref 8.9–10.3)
Creatinine, Ser: 1.35 mg/dL — ABNORMAL HIGH (ref 0.44–1.00)
GFR calc Af Amer: 41 mL/min — ABNORMAL LOW (ref >60)
GFR calc non Af Amer: 36 mL/min — ABNORMAL LOW (ref >60)
Glucose, Bld: 70 mg/dL (ref 65–99)
Potassium: 3.5 mmol/L (ref 3.5–5.1)
Sodium: 141 mmol/L (ref 135–145)

## 2015-02-11 MED ORDER — TRAMADOL HCL 50 MG PO TABS: 100.0000 mg | ORAL_TABLET | Freq: Four times a day (QID) | ORAL | Status: DC | PRN

## 2015-02-11 MED ORDER — HYDROCODONE-ACETAMINOPHEN 5-325 MG PO TABS
1.0000 | ORAL_TABLET | ORAL | Status: DC | PRN
Start: 2015-02-11 — End: 2015-02-18
  Administered 2015-02-11 – 2015-02-18 (×4): 1 via ORAL
  Filled 2015-02-11 (×5): qty 1

## 2015-02-11 MED ORDER — GABAPENTIN 100 MG PO CAPS
100.0000 mg | ORAL_CAPSULE | Freq: Three times a day (TID) | ORAL | Status: DC
  Administered 2015-02-11 – 2015-02-16 (×14): 100 mg via ORAL
  Filled 2015-02-11 (×16): qty 1

## 2015-02-11 NOTE — Progress Notes (Addendum)
[facility] Progress Note   Melanie Camacho ENI:[id number] DOB: April 11, 1932 DOA: 01/28/2015 PCP: PROVIDER NOT IN SYSTEM  Brief narrative: Melanie Camacho is a 81 y.o. female with CHF (EF 40-45%), possible small vessel CAD (clean cath 4/22), DMII who presented to [facility] as a transfer from a rehab center for AMS. Likely 2/2 acute delirium 2/2 UTI. Found to have septic shock and admitted to ICU. Started on pressors and stress dose steroids.  The patient was hospitalized from 4/28 through 5/24 acute on chronic diastolic heart failure.  Subjective: Patient was not complaining of pain, shortness of breath, cough, nausea or constipation.  Assessment/Plan: Principal Problem:   Septic shock/UTI -Urine culture reveals Escherichia coli -Antibiotics started on 5/28- narrowed to Keflex based on sensitivities for a seven-day course completed on 6/3.  Active Problems: Acute encephalopathy on admission -Likely secondary to above and now resolved  Acute respiratory failure-acute on chronic systolic heart failure -EF 40-45% -Chest x-ray reveals bilateral effusions and right basilar infiltrate versus atelectasis -Physical exam reveals edema of the anterior abdominal wall and her arms -BNP 1285 on 6/1-was 840 on 5/28 -Weight when discharged was down to about 95 kg-  -continue to diurese with IV Lasix-    Thrombocytopenia -Platelets continuously dropping and now down to 60 -Discussed with hematology-review of medications reveals that she was given Zyvox and cefepime on 5/28 both of which can cause this -continue to follow platelets daily-once we see they are improving, she can be discharged home -She is bleeding significantly from a skin tear in her right groin-hold Eliquis today   AKI on  CKD (chronic kidney disease), stage III -Baseline creatinine is about 1.2--Cr continues to improve diuresis  Mildly elevated troponin -On 5/28 troponin was noted to be 0.04>0.05 > 0.04> 0.06 -Likely secondary to  acute infection/septic shock-no further management  PAF - Holding Eliquis secondary to thrombocytopenia -Continue bisoprolol  Recent shingles -left upper back-completed treatment with valacyclovir  Hypotension -follow carefully- resumed Bisoprolol to prevent A. fib with RVR  Diabetes mellitus -Diet controlled-follow CBGs  Hypothyroidism -Continue levothyroxine    S/P AVR-(tissue) 2011 at [facility] - no CABG with this    Obesity- BMI 45   Code Status: DO NOT RESUSCITATE Family Communication:  Disposition Plan: Patient refusing to go to skilled nursing-will go home with daughter with home health DVT prophylaxis: Eliquis Consultants: Pulmonary critical care Procedures: Echo 5/17 Left ventricle: The cavity size was normal. Systolic function was mildly to moderately reduced. The estimated ejection fraction was in the range of 40% to 45%. Regional wall motion abnormalities cannot be excluded. - Aortic valve: There was mild regurgitation. - Mitral valve: Calcified annulus. Mildly thickened leaflets . There was mild regurgitation. - Right ventricle: The cavity size was mildly dilated. Systolic function was mildly reduced. - Right atrium: The atrium was mildly dilated. - Pulmonary arteries: PA peak pressure: 32 mm Hg (S). - Pericardium, extracardiac: A trivial pericardial effusion was identified.   Antibiotics: Anti-infectives    Start     Dose/Rate Route Frequency Ordered Stop   02/10/15 2000  cephALEXin (KEFLEX) capsule 500 mg     500 mg Oral Every 8 hours 02/10/15 1212 02/10/15 2050   02/08/15 2000  cephALEXin (KEFLEX) capsule 500 mg  Status:  Discontinued     500 mg Oral Every 8 hours 02/08/15 1715 02/10/15 1212   02/05/15 1800  azithromycin (ZITHROMAX) 250 mg in dextrose 5 % 125 mL IVPB  Status:  Discontinued     250 mg 125  mL/hr over 60 Minutes Intravenous Every 24 hours 2015-02-06 2022 02/07/15 1148   Feb 06, 2015 2200  linezolid (ZYVOX) IVPB 600 mg  Status:   Discontinued     600 mg 300 mL/hr over 60 Minutes Intravenous Every 12 hours 2015-02-06 2022 02/06/15 1224   02-06-15 2200  ceFEPIme (MAXIPIME) 2 g in dextrose 5 % 50 mL IVPB  Status:  Discontinued     2 g 100 mL/hr over 30 Minutes Intravenous Every 24 hours 02/06/2015 2057 02/08/15 1715   Feb 06, 2015 1630  linezolid (ZYVOX) IVPB 600 mg  Status:  Discontinued     600 mg 300 mL/hr over 60 Minutes Intravenous  Once 2015-02-06 1620 02/06/15 2023   Feb 06, 2015 1630  levofloxacin (LEVAQUIN) IVPB 500 mg     500 mg 100 mL/hr over 60 Minutes Intravenous  Once 02-06-2015 1620 06-Feb-2015 1810   2015-02-06 1500  cefTRIAXone (ROCEPHIN) 1 g in dextrose 5 % 50 mL IVPB     1 g 100 mL/hr over 30 Minutes Intravenous  Once Feb 06, 2015 1446 02/06/15 1614      Objective: Filed Weights   02/09/15 0529 02/10/15 0508 02/11/15 0541  Weight: 105 kg (231 lb 7.7 oz) 104.7 kg (230 lb 13.2 oz) 101.9 kg (224 lb 10.4 oz)    Intake/Output Summary (Last 24 hours) at 02/11/15 1253 Last data filed at 02/11/15 0539  Gross per 24 hour  Intake    980 ml  Output   2350 ml  Net  -1370 ml     Vitals Filed Vitals:   02/10/15 1650 02/10/15 2212 02/11/15 0541 02/11/15 0611  BP:  96/61 89/50 99/45   Pulse:  81 98   Temp:  97.6 F (36.4 C) 98.6 F (37 C)   TempSrc:  Oral Oral   Resp:  17 17   Height:      Weight:   101.9 kg (224 lb 10.4 oz)   SpO2: 95% 97% 98%     Exam:  General:  Pt is alert, not in acute distress  HEENT: No icterus, No thrush, oral mucosa moist  Cardiovascular: regular rate and rhythm, S1/S2 No murmur  Respiratory: crackles b/l bases R> L-  Abdomen: Soft, +Bowel sounds, non tender, non distended, no guarding + abdominal wall edema  MSK: No LE edema, cyanosis or clubbing  Data Reviewed: Basic Metabolic Panel:  Recent Labs Lab 02-06-15 2130 02/05/15 0840  02/07/15 0233 02/08/15 0524 02/09/15 0428 02/10/15 0534 02/11/15 0448  NA 140 141  < > 139 141 140 140 141  K 5.7* 5.7*  < > 4.4 4.1 4.1 4.3  3.5  CL 104 105  < > 106 104 102 102 102  CO2 30 27  < > 25 27 25 28 27   GLUCOSE 70 90  < > 102* 78 70 63* 70  BUN 38* 38*  < > 35* 31* 34* 34* 32*  CREATININE 1.41* 1.51*  < > 1.60* 1.63* 1.59* 1.38* 1.35*  CALCIUM 8.3* 8.8*  < > 8.6* 8.9 8.7* 8.8* 9.0  MG 2.6* 2.4  --   --  2.4 2.2 2.1  --   PHOS 3.7 3.6  --   --  4.1 4.4 4.3  --   < > = values in this interval not displayed. Liver Function Tests:  Recent Labs Lab 2015/02/06 1318 Feb 06, 2015 2130 02/07/15 0233  AST 34 28 34  ALT 16 14 16   ALKPHOS 74 63 65  BILITOT 1.1 1.0 1.0  PROT 7.4 6.3* 6.7  ALBUMIN 3.2* 2.4* 2.6*  Recent Labs Lab 02/05/15 1917  LIPASE 13*  AMYLASE 22*    Recent Labs Lab 15-Feb-2015 1318  AMMONIA 53*   CBC:  Recent Labs Lab 02/15/2015 2130  02/07/15 0233 02/08/15 0524 02/09/15 0428 02/10/15 0534 02/11/15 0448  WBC 3.0*  < > 4.5 4.5 3.8* 4.1 5.0  NEUTROABS 1.9  --   --  2.5 1.8 2.5 3.3  HGB 9.1*  < > 10.6* 10.2* 11.1* 9.9* 10.4*  HCT 29.7*  < > 35.4* 32.7* 36.4 32.0* 33.7*  MCV 103.1*  < > 106.3* 103.8* 106.1* 105.3* 104.3*  PLT 121*  < > 102* 108* 83* 85* 60*  < > = values in this interval not displayed. Cardiac Enzymes:  Recent Labs Lab 02/15/2015 1318 02-15-2015 2130 02/05/15 0230 02/05/15 0840  TROPONINI 0.04* 0.05* 0.04* 0.06*   BNP (last 3 results)  Recent Labs  15-Feb-2015 1318 02/08/15 0524 02/10/15 0534  BNP 840.0* 1285.4* 1210.7*    ProBNP (last 3 results) No results for input(s): PROBNP in the last 8760 hours.  CBG:  Recent Labs Lab 02/06/15 2026 02/06/15 2344 02/07/15 0343 02/07/15 0817 02/07/15 1154  GLUCAP 162* 117* 101* 101* 79    Recent Results (from the past 240 hour(s))  Blood culture (routine x 2)     Status: None   Collection Time: 02-15-2015  1:18 PM  Result Value Ref Range Status   Specimen Description LEFT ANTECUBITAL  Final   Special Requests BOTTLES DRAWN AEROBIC ONLY 8CC  Final   Culture NO GROWTH 5 DAYS  Final   Report Status 02/09/2015  FINAL  Final  Urine culture     Status: None   Collection Time: 15-Feb-2015  2:20 PM  Result Value Ref Range Status   Specimen Description URINE, CATHETERIZED  Final   Special Requests NONE  Final   Colony Count   Final    >=100,000 COLONIES/ML Performed at [facility]    Culture   Final    ESCHERICHIA COLI KLEBSIELLA PNEUMONIAE Performed at [facility]    Report Status 02/08/2015 FINAL  Final   Organism ID, Bacteria ESCHERICHIA COLI  Final   Organism ID, Bacteria KLEBSIELLA PNEUMONIAE  Final      Susceptibility   Escherichia coli - MIC*    AMPICILLIN >=32 RESISTANT Resistant     CEFAZOLIN <=4 SENSITIVE Sensitive     CEFTRIAXONE <=1 SENSITIVE Sensitive     CIPROFLOXACIN >=4 RESISTANT Resistant     GENTAMICIN >=16 RESISTANT Resistant     LEVOFLOXACIN >=8 RESISTANT Resistant     NITROFURANTOIN <=16 SENSITIVE Sensitive     TOBRAMYCIN >=16 RESISTANT Resistant     TRIMETH/SULFA <=20 SENSITIVE Sensitive     PIP/TAZO 8 SENSITIVE Sensitive     * ESCHERICHIA COLI   Klebsiella pneumoniae - MIC*    AMPICILLIN RESISTANT      CEFAZOLIN <=4 SENSITIVE Sensitive     CEFTRIAXONE <=1 SENSITIVE Sensitive     CIPROFLOXACIN <=0.25 SENSITIVE Sensitive     GENTAMICIN <=1 SENSITIVE Sensitive     LEVOFLOXACIN <=0.12 SENSITIVE Sensitive     NITROFURANTOIN 64 INTERMEDIATE Intermediate     TOBRAMYCIN <=1 SENSITIVE Sensitive     TRIMETH/SULFA <=20 SENSITIVE Sensitive     PIP/TAZO <=4 SENSITIVE Sensitive     * KLEBSIELLA PNEUMONIAE  Culture, Urine     Status: None   Collection Time: 02-15-15  7:08 PM  Result Value Ref Range Status   Specimen Description URINE, CATHETERIZED  Final   Special Requests NONE  Final   Colony Count   Final    >=100,000 COLONIES/ML Performed at [facility]    Culture   Final    Multiple bacterial morphotypes present, none predominant. Suggest appropriate recollection if clinically indicated. Performed at [facility]    Report Status  02/06/2015 FINAL  Final     Studies: No results found.  Scheduled Meds:  Scheduled Meds: . bisoprolol  5 mg Oral Daily  . cholecalciferol  5,000 Units Oral Daily  . colesevelam  1,875 mg Oral BID WC  . feeding supplement (PRO-STAT SUGAR FREE 64)  30 mL Oral Daily  . furosemide  40 mg Intravenous BID  . levothyroxine  50 mcg Oral QAC breakfast  . multivitamin  1 tablet Oral Daily  . pantoprazole  40 mg Oral QHS  . rOPINIRole  0.25 mg Oral TID   Continuous Infusions:   Time spent on care of this patient: 35 min   Melanie Poucher, MD 02/11/2015, 12:53 PM  LOS: 7 days   [facility] Office  [phone] Pager - Text Page per [email or web address] If 7PM-7AM, please contact night-coverage [email or web address]

## 2015-02-11 NOTE — Consult Note (Signed)
[facility] CONSULT NOTE  Patient Care Team: Provider Not In System as PCP - General  CHIEF COMPLAINTS/PURPOSE OF CONSULTATION:  Progressive thrombocytopenia  HISTORY OF PRESENTING ILLNESS:  Melanie Camacho 81 y.o. female is here because of thrombocytopenia.  She was found to have abnormal CBC from recent blood draw. I have the opportunity to review her blood test results over the last 2 months. She has recurrent admission to the hospital from congestive heart failure over the last few months. Her platelet count has fluctuated between 100,000 to 181,000 prior to current admission. On this hospitalization, she was admitted to the hospital since 02/26/15 with a normal baseline platelet count of 141,000 with associated mild leukopenia and macrocytic anemia. On 02/26/2015, she was transferred to the emergency department from the rehabilitation center for acute mental status change. Chest x-ray at that time showed abnormalities in the right lower lobe suspicious for possible pneumonia. Urinalysis is also grossly abnormal consistent with possible urosepsis. She was started with broad-spectrum intravenous antibiosis with cefepime and linezolid. After cultures and sensitivity were obtained, her antibiotic regimen was subsequently changed to levofloxacin and most recently to Keflex. Starting 02/07/2015, her platelet count has started to get progressively worse to as low as 60,000 today. Her hemoglobin fluctuated from as low as 9.9 to as high as 11.1 with associated macrocytosis. Leukopenia has resolved. She denies fevers or chills. The patient have chronic anasarca from congestive heart failure. She has a small abdominal wall wound and has been losing serosanguineous fluid. She thought that started with a small skin tear. She also has blisters on her lower extremity that were also oozing. She denies recent spontaneous epistaxis, hematuria, melena or hematochezia The patient had  valvular heart disease with atrial fibrillation and is on chronic anticoagulation therapy with Eliquis Her last imaging study with CT scan of the abdomen dated 01/06/2015 show a liver masses and liver enlargement. Serum vitamin B-12, folate drawn on 12/22/2014 and 02/01/2015 were within normal limits. Serum TIBC, iron and ferritin drawn on 12/22/2014 were consistent with anemia of chronic disease picture Since admission, she is improving. She is currently on aggressive diarrhetic therapy for management of congestive heart failure. She is using oxygen. She denies cough or increasing shortness of breath.  MEDICAL HISTORY:  Past Medical History  Diagnosis Date  . Anginal pain   . Hypertension   . Heart murmur   . CHF (congestive heart failure)   . Shortness of breath   . Complication of anesthesia     "I had a slight reaction to the morphine given in surgery"  . High cholesterol   . Coronary artery disease   . Myocardial infarction 12/2013  . Pneumonia "once or twice"  . 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 Type II diabetes mellitus   . Anemia   . History of blood transfusion     "related to the anemia"  . GERD (gastroesophageal reflux disease)   . Arthritis     "all over"  . Chronic lower back pain     SURGICAL HISTORY: Past Surgical History  Procedure Laterality Date  . Left heart catheterization with coronary angiogram N/A 12/28/2013    Procedure: LEFT HEART CATHETERIZATION WITH CORONARY ANGIOGRAM;  Surgeon: Lennette Bihari, MD;  Location: [facility] CATH LAB;  Service: Cardiovascular;  Laterality: N/A;  . Cardiac catheterization    . Cholecystectomy open  1980's  . Appendectomy  ~ 1948  . Tonsillectomy  1954  .  Hernia repair    . Abdominal hernia repair  "many times"  . Hemorrhoid banding    . Fracture surgery    . Foot fracture surgery Right ~ 1980  . Cataract extraction Right     SOCIAL HISTORY: History   Social History  . Marital Status: Widowed    Spouse  Name: N/A  . Number of Children: N/A  . Years of Education: N/A   Occupational History  . Not on file.   Social History Main Topics  . Smoking status: Never Smoker   . Smokeless tobacco: Never Used  . Alcohol Use: No  . Drug Use: No  . Sexual Activity: No   Other Topics Concern  . Not on file   Social History Narrative    FAMILY HISTORY: Family History  Problem Relation Age of Onset  . Hypertension Mother     ALLERGIES:  is allergic to morphine and related; bacitracin; betadine; clindamycin/lincomycin; other; tape; and vancomycin.  MEDICATIONS:  Current Facility-Administered Medications  Medication Dose Route Frequency Provider Last Rate Last Dose  . 0.9 %  sodium chloride infusion  250 mL Intravenous PRN Jenelle Mages, MD   Stopped at 02/07/15 1300  . bisoprolol (ZEBETA) tablet 5 mg  5 mg Oral Daily Calvert Cantor, MD   5 mg at 02/11/15 1050  . cholecalciferol (VITAMIN D) tablet 5,000 Units  5,000 Units Oral Daily Jenelle Mages, MD   5,000 Units at 02/11/15 1051  . colesevelam [facility]) tablet 1,875 mg  1,875 mg Oral BID WC Jenelle Mages, MD   1,875 mg at 02/11/15 0851  . feeding supplement (PRO-STAT SUGAR FREE 64) liquid 30 mL  30 mL Oral Daily Jenifer A Williams, RD   30 mL at 02/11/15 1051  . furosemide (LASIX) injection 40 mg  40 mg Intravenous BID Calvert Cantor, MD   40 mg at 02/11/15 0843  . HYDROcodone-acetaminophen (NORCO/VICODIN) 5-325 MG per tablet 1 tablet  1 tablet Oral Q4H PRN Calvert Cantor, MD   1 tablet at 02/11/15 1314  . ibuprofen (ADVIL,MOTRIN) 100 MG/5ML suspension 400 mg  400 mg Per Tube Q6H PRN Karl Ito, MD   400 mg at 02/11/15 1610  . lactulose (CHRONULAC) 10 GM/15ML solution 10 g  10 g Oral BID PRN Calvert Cantor, MD   10 g at 02/10/15 0456  . levothyroxine (SYNTHROID, LEVOTHROID) tablet 50 mcg  50 mcg Oral QAC breakfast Julio Sicks, NP   50 mcg at 02/11/15 0843  . multivitamin (PROSIGHT) tablet 1 tablet  1 tablet Oral Daily Bethann Berkshire,  MD   1 tablet at 02/11/15 1051  . pantoprazole (PROTONIX) EC tablet 40 mg  40 mg Oral QHS Jenelle Mages, MD   40 mg at 02/10/15 2109  . rOPINIRole (REQUIP) tablet 0.25 mg  0.25 mg Oral TID Calvert Cantor, MD   0.25 mg at 02/11/15 1051    REVIEW OF SYSTEMS:   Constitutional: Denies fevers, chills or abnormal night sweats Eyes: Denies blurriness of vision, double vision or watery eyes Ears, nose, mouth, throat, and face: Denies mucositis or sore throat Cardiovascular: Denies palpitation, chest discomfort  Gastrointestinal:  Denies nausea, heartburn or change in bowel habits Lymphatics: Denies new lymphadenopathy  Neurological:Denies numbness, tingling or new weaknesses Behavioral/Psych: Mood is stable, no new changes  All other systems were reviewed with the patient and are negative.  PHYSICAL EXAMINATION: ECOG PERFORMANCE STATUS: 2 - Symptomatic, <50% confined to bed  Filed Vitals:   02/11/15 9604  BP: 99/45  Pulse:   Temp:   Resp:    Filed Weights   02/09/15 0529 02/10/15 0508 02/11/15 0541  Weight: 231 lb 7.7 oz (105 kg) 230 lb 13.2 oz (104.7 kg) 224 lb 10.4 oz (101.9 kg)    GENERAL:alert, no distress and comfortable. She is morbidly obese. She have oxygen delivered via nasal cannula  SKIN: She has gross anasarca in her skin and significant bilateral lower extremity edema. She has extensive bruises but no petechiae.  EYES: normal, conjunctiva are pink and non-injected, sclera clear OROPHARYNX:no exudate, no erythema and lips, buccal mucosa, and tongue normal  NECK: supple, thyroid normal size, non-tender, without nodularity LYMPH:  no palpable lymphadenopathy in the cervical, axillary or inguinal LUNGS: clear to auscultation with mildly increased breathing effort and reduced breath sounds in bilateral lower basis  HEART: Irregular rate and rhythm with soft systolic murmur in the left sternal border with gross bilateral lower extremity edema ABDOMEN:abdomen soft, significant  obesity does limit examination. I cannot appreciate hepatosplenomegaly. Musculoskeletal:no cyanosis of digits and no clubbing  PSYCH: alert & oriented x 3 with fluent speech NEURO: no focal motor/sensory deficits  LABORATORY DATA:  I have reviewed the data as listed Lab Results  Component Value Date   WBC 5.0 02/11/2015   HGB 10.4* 02/11/2015   HCT 33.7* 02/11/2015   MCV 104.3* 02/11/2015   PLT 60* 02/11/2015   I review her peripheral blood smear. Normocytic normochromic anemia is noted. She has adequate and normal white blood cells. There is absolute thrombocytopenia with occasional platelet clumping. I cannot appreciate any schistocytes.  RADIOGRAPHIC STUDIES: I have personally reviewed the radiological images as listed and agreed with the findings in the report. Ct Head Wo Contrast  02-12-15   CLINICAL DATA:  Lethargy.  Incontinence.  EXAM: CT HEAD WITHOUT CONTRAST  TECHNIQUE: Contiguous axial images were obtained from the base of the skull through the vertex without intravenous contrast.  COMPARISON:  01/12/2015  FINDINGS: Prominence of the sulci and ventricles noted consistent with age related brain atrophy. No evidence for acute brain infarct, intracranial hemorrhage or mass. No abnormal extra-axial fluid collections. Cranial exostosis overlying the left frontal lobe is again noted. The paranasal sinuses and mastoid air cells are clear. The calvarium is intact.  IMPRESSION: 1. No acute intracranial abnormality. 2. Age related brain atrophy.   Electronically Signed   By: Signa Kell M.D.   On: 12-Feb-2015 13:53   Dg Chest Port 1 View  02/06/2015   CLINICAL DATA:  Pneumonia  EXAM: PORTABLE CHEST - 1 VIEW  COMPARISON:  02/12/2015  FINDINGS: Cardiac shadow is within normal limits. Persistent right-sided pleural effusion and right basilar changes are noted. A smaller left-sided pleural effusion is again seen and stable. A nasogastric catheter is extending into the stomach. Aortic  calcifications are seen.  IMPRESSION: Stable bilateral pleural effusions and right basilar changes.  New nasogastric catheter   Electronically Signed   By: Alcide Clever M.D.   On: 02/06/2015 07:33   Dg Chest Portable 1 View  02/12/2015   CLINICAL DATA:  Altered mental status.  EXAM: PORTABLE CHEST - 1 VIEW  COMPARISON:  01/17/2015  FINDINGS: Persistent atelectasis versus infiltrate involving the right lower lung zone with associated probable right pleural effusion. There is a small left pleural effusion. No overt pulmonary edema identified. Interval removal of PICC line. Stable cardiac enlargement.  IMPRESSION: Persistent atelectasis versus infiltrate involving the right lower lung with associated right pleural effusion. Smaller left pleural effusion  present.   Electronically Signed   By: Irish Lack M.D.   On: 01/28/2015 12:46   Dg Chest Port 1 View  01/17/2015   CLINICAL DATA:  Status post central line placement.  EXAM: PORTABLE CHEST - 1 VIEW  COMPARISON:  January 07, 2015.  FINDINGS: Stable cardiomegaly. Stable mild central pulmonary vascular congestion is noted. Interval placement of right-sided PICC line with distal tip overlying expected position of right brachycephalic vein. No pneumothorax is noted. Bilateral pleural effusions are noted. Right midlung opacity is noted consistent with subsegmental atelectasis or possibly pneumonia.  IMPRESSION: Interval placement of right-sided PICC line with distal tip overlying expected position of right brachycephalic vein. Bilateral pleural effusions are again noted with right midlung opacity concerning for subsegmental atelectasis or possibly pneumonia.   Electronically Signed   By: Lupita Raider, M.D.   On: 01/17/2015 12:25   Dg Abd Portable 1v  02/05/2015   CLINICAL DATA:  Acute onset of generalized abdominal pain and distention. Initial encounter.  EXAM: PORTABLE ABDOMEN - 1 VIEW  COMPARISON:  Abdominal radiograph performed earlier today at 1:15 a.m.   FINDINGS: The patient's enteric tube is noted ending overlying the body of the stomach.  The visualized bowel gas pattern is grossly unremarkable. No free intra-abdominal air is seen, though evaluation for free air is limited on a single supine view. Scattered clips are noted at the right mid abdomen.  Mild degenerative change is noted along the lumbar spine.  IMPRESSION: 1. Enteric tube noted ending overlying the body of the stomach. 2. Unremarkable bowel gas pattern; no free intra-abdominal air seen. Small amount of stool noted in the colon.   Electronically Signed   By: Roanna Raider M.D.   On: 02/05/2015 18:44   Dg Abd Portable 1v  02/05/2015   CLINICAL DATA:  Nasogastric tube placement.  Initial encounter.  EXAM: PORTABLE ABDOMEN - 1 VIEW  COMPARISON:  CT of the abdomen and pelvis performed 01/06/2015, and chest radiograph performed 01/09/2015  FINDINGS: The patient's enteric tube is noted ending overlying the body of the stomach. Evaluation is mildly suboptimal due to motion artifact. The visualized bowel gas pattern is grossly unremarkable.  A small to moderate right-sided pleural effusion is again noted, as well as a small left pleural effusion. Mild degenerative change is noted along the lower thoracic and lumbar spine. Postoperative change is noted at the right mid abdomen.  IMPRESSION: 1. Enteric tube noted ending overlying the body of the stomach. 2. Small to moderate right-sided pleural effusion, and small left pleural effusion.   Electronically Signed   By: Roanna Raider M.D.   On: 02/05/2015 01:33   US Abdomen Limited Ruq  01/26/2015   CLINICAL DATA:  81 year old female with a history of prior lesion on CT study 01/06/2015  EXAM: US ABDOMEN LIMITED - RIGHT UPPER QUADRANT  COMPARISON:  CT 01/06/2015  FINDINGS: Gallbladder:  Surgical changes of cholecystectomy  Common bile duct:  Diameter: 7.3 mm  Liver:  Relatively heterogeneous echotexture of the liver. Anechoic structure with through  transmission and no internal complexity within the right liver lobe measures 2.8 cm x 2.5 cm x 3.1 cm. No internal flow.  IMPRESSION: Right liver lobe cyst.  Evidence of liver steatosis.  Signed,  Yvone Neu. Loreta Ave, DO  [facility]  [facility]   Electronically Signed   By: Gilmer Mor D.O.   On: 01/26/2015 07:39    ASSESSMENT & PLAN Progressive thrombocytopenia I believe the patient  may have chronic mild thrombocytopenia from significant hepatomegaly, from chronic congestion related to congestive heart failure. However, I believe the recent drop of platelet count was likely related to recent exposure to cefepime and Linezolid. Both antibiotics were discontinued. She have repeat serum vitamin B-12 and folate level drawn in the last 2 months and I do not see any benefit of repeating these tests. Both times, the results were within normal limits. She have intermittent oozing from her abdominal wall wound and her leg blisters which I think caused increased consumption of platelets and hence would explain the cause of the drop of her platelet count. The patient also had mild platelet clumping. Overall, I believe the platelet count will improve in time. As the drop of platelet count has not nadired, I would not recommend discharge until we see her rebound on her platelet count. She can continue on anticoagulation therapy as well as her platelet count remained greater than 50,000. Obviously this has to be balanced with the risk of increased bleeding which she has on a regular basis due to poor wound healing on the wound on her abdominal wall and bilateral lower extremities. She does not need platelet transfusion unless her platelet count dropped to less than 20,000.  Anemia in chronic disease Her hemoglobin has stayed stable around 10 g. There is no indication to do any further workup and she would not need any blood transfusion for now. The macrocytosis in  with associated anemia secondarily related to her liver congestion and possibly recent bleeding from her wound  Leukopenia, resolved  Atrial fibrillation on chronic anticoagulation therapy Certainly, she would benefit from chronic anticoagulation therapy to prevent stroke. I am comfortable for the patient to resume anticoagulation therapy as long as the platelet count is greater than 50,000 and no major bleeding is observed.  Discharge planning I will plan to return to see her tomorrow afternoon. However, if her platelet count rebounded to greater than 60,000, she can be discharged before seeing me back. I will attempt to make a return appointment to see her back within the next 2-3 weeks in my office early next week and we will contact the patient for this appointment.  Overall, spent 60 minutes on this case with greater than 35 minutes with the patient and counseling.

## 2015-02-12 DIAGNOSIS — D696 Thrombocytopenia, unspecified: Secondary | ICD-10-CM | POA: Insufficient documentation

## 2015-02-12 LAB — CBC WITH DIFFERENTIAL/PLATELET
BASOS ABS: 0.1 10*3/uL (ref 0.0–0.1)
Basophils Relative: 1 % (ref 0–1)
Eosinophils Absolute: 0.4 10*3/uL (ref 0.0–0.7)
Eosinophils Relative: 7 % — ABNORMAL HIGH (ref 0–5)
HCT: 31.5 % — ABNORMAL LOW (ref 36.0–46.0)
HEMOGLOBIN: 9.9 g/dL — AB (ref 12.0–15.0)
LYMPHS ABS: 0.4 10*3/uL — AB (ref 0.7–4.0)
Lymphocytes Relative: 8 % — ABNORMAL LOW (ref 12–46)
MCH: 32.2 pg (ref 26.0–34.0)
MCHC: 31.4 g/dL (ref 30.0–36.0)
MCV: 102.6 fL — ABNORMAL HIGH (ref 78.0–100.0)
MONO ABS: 1.3 10*3/uL — AB (ref 0.1–1.0)
MONOS PCT: 24 % — AB (ref 3–12)
NEUTROS ABS: 3.3 10*3/uL (ref 1.7–7.7)
Neutrophils Relative %: 60 % (ref 43–77)
Platelets: 49 10*3/uL — ABNORMAL LOW (ref 150–400)
RBC: 3.07 MIL/uL — AB (ref 3.87–5.11)
RDW: 20.2 % — ABNORMAL HIGH (ref 11.5–15.5)
WBC: 5.5 10*3/uL (ref 4.0–10.5)

## 2015-02-12 LAB — BASIC METABOLIC PANEL
Anion gap: 14 (ref 5–15)
BUN: 35 mg/dL — ABNORMAL HIGH (ref 6–20)
CHLORIDE: 106 mmol/L (ref 101–111)
CO2: 25 mmol/L (ref 22–32)
Calcium: 9.1 mg/dL (ref 8.9–10.3)
Creatinine, Ser: 1.37 mg/dL — ABNORMAL HIGH (ref 0.44–1.00)
GFR calc Af Amer: 41 mL/min — ABNORMAL LOW (ref >60)
GFR, EST NON AFRICAN AMERICAN: 35 mL/min — AB (ref >60)
GLUCOSE: 41 mg/dL — AB (ref 65–99)
Potassium: 4.6 mmol/L (ref 3.5–5.1)
SODIUM: 145 mmol/L (ref 135–145)

## 2015-02-12 NOTE — Progress Notes (Signed)
Lab called with critical blood glucose level of 41. MD notified.

## 2015-02-12 NOTE — Progress Notes (Signed)
Melanie Camacho   DOB:Jul 07, 1932   OM#:[id number]    Subjective: She feels well. Denies any chest pain nausea suppressed. She is eager to go home. The patient denies any recent signs or symptoms of bleeding such as spontaneous epistaxis, hematuria or hematochezia.   Objective:  Filed Vitals:   02/11/15 2121  BP: 99/61  Pulse: 52  Temp: 98.4 F (36.9 C)  Resp: 18     Intake/Output Summary (Last 24 hours) at 02/12/15 1118 Last data filed at 02/12/15 8453  Gross per 24 hour  Intake    360 ml  Output   1950 ml  Net  -1590 ml    GENERAL:alert, no distress and comfortable SKIN: significant skin bruising EYES: normal, Conjunctiva are pink and non-injected, sclera clear Musculoskeletal:no cyanosis of digits and no clubbing  NEURO: alert & oriented x 3 with fluent speech, no focal motor/sensory deficits   Labs:  Lab Results  Component Value Date   WBC 5.5 02/12/2015   HGB 9.9* 02/12/2015   HCT 31.5* 02/12/2015   MCV 102.6* 02/12/2015   PLT 49* 02/12/2015   NEUTROABS 3.3 02/12/2015    Lab Results  Component Value Date   NA 145 02/12/2015   K 4.6 02/12/2015   CL 106 02/12/2015   CO2 25 02/12/2015   Assessment & Plan:   Progressive thrombocytopenia I believe the patient may have chronic mild thrombocytopenia from significant hepatomegaly, from chronic congestion related to congestive heart failure. However, I believe the recent drop of platelet count was likely related to recent exposure to cefepime and Linezolid. Both antibiotics were discontinued. As the drop of platelet count has not nadired, I would not recommend discharge until we see her rebound on her platelet count. With this low platelet count, I recommend holding off further anticoagulation therapy until we can document her platelet count is greater than 50,000 She does not need platelet transfusion unless her platelet count dropped to less than 20,000.  Anemia in chronic disease Her hemoglobin has stayed stable  around 10 g. There is no indication to do any further workup and she would not need any blood transfusion for now. The macrocytosis in with associated anemia secondarily related to her liver congestion and possibly recent bleeding from her wound  Leukopenia, resolved  Atrial fibrillation on chronic anticoagulation therapy Certainly, she would benefit from chronic anticoagulation therapy to prevent stroke. Recommend holding anticoagulation therapy and to platelet rebound to greater than 50,000  Discharge planning If repeat CBC showed improvement of platelet count, she can be discharged. I have made tentative appointment to see her back in my office on 02/27/2015 at 1 PM.  [facility], Aleksandar Duve, MD 02/12/2015  11:18 AM

## 2015-02-12 NOTE — Progress Notes (Signed)
[facility] Progress Note   Melanie Camacho ZOX:[id number] DOB: August 20, 1932 DOA: 01/12/2015 PCP: PROVIDER NOT IN SYSTEM  Brief narrative: Melanie Camacho is a 81 y.o. female with CHF (EF 40-45%), possible small vessel CAD (clean cath 4/22), DMII who presented to [facility] as a transfer from a rehab center for AMS. Likely 2/2 acute delirium 2/2 UTI. Found to have septic shock and admitted to ICU. Started on pressors and stress dose steroids.  The patient was hospitalized from 4/28 through 5/24 acute on chronic diastolic heart failure.  Subjective: Patient alert- no complaints of cough, dyspnea, nausea, abdominal pain or constipation.   Assessment/Plan: Principal Problem:   Septic shock/UTI -Urine culture reveals Escherichia coli -Antibiotics started on 5/28- narrowed to Keflex based on sensitivities for a seven-day course completed on 6/3.  Active Problems: Acute encephalopathy on admission -Likely secondary to above and now resolved  Acute respiratory failure-acute on chronic systolic heart failure -EF 40-45% -Chest x-ray reveals bilateral effusions and right basilar infiltrate versus atelectasis -Physical exam reveals edema of the anterior abdominal wall and her arms -BNP 1285 on 6/1-was 840 on 5/28 -Weight when discharged was down to about 95 kg-  -continue to diurese with IV Lasix-  Down to 97 kg today  Thrombocytopenia -Platelets continuously dropping and now down to 49-  Eliquis on hold -Discussed with hematology-review of medications reveals that she was given Zyvox and cefepime on 5/28 both of which can cause this -continue to follow platelets daily-once we see they are improving, she can be discharged home -She was bleeding significantly from a skin tear in her right groin- this has resolved after a pressure dressing   AKI on  CKD (chronic kidney disease), stage III -Baseline creatinine is about 1.2--Cr continues to improve diuresis  Mildly elevated troponin -On 5/28  troponin was noted to be 0.04>0.05 > 0.04> 0.06 -Likely secondary to acute infection/septic shock-no further management  PAF - Holding Eliquis secondary to thrombocytopenia -Continue bisoprolol  Recent shingles -left upper back-completed treatment with valacyclovir  Hypotension -follow carefully- resumed Bisoprolol to prevent A. fib with RVR  Diabetes mellitus -Diet controlled-follow CBGs  Hypothyroidism -Continue levothyroxine    S/P AVR-(tissue) 2011 at [facility] - no CABG with this    Obesity- BMI 45   Code Status: DO NOT RESUSCITATE Family Communication:  Disposition Plan: Patient refusing to go to skilled nursing-will go home with daughter with home health DVT prophylaxis: Eliquis Consultants: Pulmonary critical care Procedures: Echo 5/17 Left ventricle: The cavity size was normal. Systolic function was mildly to moderately reduced. The estimated ejection fraction was in the range of 40% to 45%. Regional wall motion abnormalities cannot be excluded. - Aortic valve: There was mild regurgitation. - Mitral valve: Calcified annulus. Mildly thickened leaflets . There was mild regurgitation. - Right ventricle: The cavity size was mildly dilated. Systolic function was mildly reduced. - Right atrium: The atrium was mildly dilated. - Pulmonary arteries: PA peak pressure: 32 mm Hg (S). - Pericardium, extracardiac: A trivial pericardial effusion was identified.   Antibiotics: Anti-infectives    Start     Dose/Rate Route Frequency Ordered Stop   02/10/15 2000  cephALEXin (KEFLEX) capsule 500 mg     500 mg Oral Every 8 hours 02/10/15 1212 02/10/15 2050   02/08/15 2000  cephALEXin (KEFLEX) capsule 500 mg  Status:  Discontinued     500 mg Oral Every 8 hours 02/08/15 1715 02/10/15 1212   02/05/15 1800  azithromycin (ZITHROMAX) 250 mg in dextrose 5 % 125  mL IVPB  Status:  Discontinued     250 mg 125 mL/hr over 60 Minutes Intravenous Every 24 hours Feb 06, 2015 2022  02/07/15 1148   02/06/2015 2200  linezolid (ZYVOX) IVPB 600 mg  Status:  Discontinued     600 mg 300 mL/hr over 60 Minutes Intravenous Every 12 hours 02/06/2015 2022 02/06/15 1224   02/06/15 2200  ceFEPIme (MAXIPIME) 2 g in dextrose 5 % 50 mL IVPB  Status:  Discontinued     2 g 100 mL/hr over 30 Minutes Intravenous Every 24 hours 06-Feb-2015 2057 02/08/15 1715   2015-02-06 1630  linezolid (ZYVOX) IVPB 600 mg  Status:  Discontinued     600 mg 300 mL/hr over 60 Minutes Intravenous  Once 02/06/2015 1620 Feb 06, 2015 2023   02/06/15 1630  levofloxacin (LEVAQUIN) IVPB 500 mg     500 mg 100 mL/hr over 60 Minutes Intravenous  Once 02/06/2015 1620 06-Feb-2015 1810   2015-02-06 1500  cefTRIAXone (ROCEPHIN) 1 g in dextrose 5 % 50 mL IVPB     1 g 100 mL/hr over 30 Minutes Intravenous  Once 06-Feb-2015 1446 02/06/2015 1614      Objective: Filed Weights   02/10/15 0508 02/11/15 0541 02/12/15 0800  Weight: 104.7 kg (230 lb 13.2 oz) 101.9 kg (224 lb 10.4 oz) 97.8 kg (215 lb 9.8 oz)    Intake/Output Summary (Last 24 hours) at 02/12/15 1220 Last data filed at 02/12/15 0332  Gross per 24 hour  Intake    360 ml  Output   1950 ml  Net  -1590 ml     Vitals Filed Vitals:   02/11/15 0611 02/11/15 1345 02/11/15 2121 02/12/15 0800  BP: 99/45 100/55 99/61   Pulse:  78 52   Temp:  98.3 F (36.8 C) 98.4 F (36.9 C)   TempSrc:  Oral Oral   Resp:  17 18   Height:      Weight:    97.8 kg (215 lb 9.8 oz)  SpO2:  98% 96%     Exam:  General:  Pt is alert, not in acute distress  HEENT: No icterus, No thrush, oral mucosa moist  Cardiovascular: regular rate and rhythm, S1/S2 No murmur  Respiratory: crackles b/l bases R> L-  Abdomen: Soft, +Bowel sounds, non tender, non distended, no guarding + abdominal wall edema  MSK: No LE edema, cyanosis or clubbing  Data Reviewed: Basic Metabolic Panel:  Recent Labs Lab 02/08/15 0524 02/09/15 0428 02/10/15 0534 02/11/15 0448 02/12/15 0902  NA 141 140 140 141 145  K 4.1  4.1 4.3 3.5 4.6  CL 104 102 102 102 106  CO2 GLUCOSE 78 70 63* 70 41*  BUN 31* 34* 34* 32* 35*  CREATININE 1.63* 1.59* 1.38* 1.35* 1.37*  CALCIUM 8.9 8.7* 8.8* 9.0 9.1  MG 2.4 2.2 2.1  --   --   PHOS 4.1 4.4 4.3  --   --    Liver Function Tests:  Recent Labs Lab 02/07/15 0233  AST 34  ALT 16  ALKPHOS 65  BILITOT 1.0  PROT 6.7  ALBUMIN 2.6*    Recent Labs Lab 02/05/15 1917  LIPASE 13*  AMYLASE 22*   No results for input(s): AMMONIA in the last 168 hours. CBC:  Recent Labs Lab 02/08/15 0524 02/09/15 0428 02/10/15 0534 02/11/15 0448 02/12/15 0615  WBC 4.5 3.8* 4.1 5.0 5.5  NEUTROABS 2.5 1.8 2.5 3.3 3.3  HGB 10.2* 11.1* 9.9* 10.4* 9.9*  HCT 32.7* 36.4  32.0* 33.7* 31.5*  MCV 103.8* 106.1* 105.3* 104.3* 102.6*  PLT 108* 83* 85* 60* 49*   Cardiac Enzymes: No results for input(s): CKTOTAL, CKMB, CKMBINDEX, TROPONINI in the last 168 hours. BNP (last 3 results)  Recent Labs  2015-02-20 1318 02/08/15 0524 02/10/15 0534  BNP 840.0* 1285.4* 1210.7*    ProBNP (last 3 results) No results for input(s): PROBNP in the last 8760 hours.  CBG:  Recent Labs Lab 02/06/15 2026 02/06/15 2344 02/07/15 0343 02/07/15 0817 02/07/15 1154  GLUCAP 162* 117* 101* 101* 79    Recent Results (from the past 240 hour(s))  Blood culture (routine x 2)     Status: None   Collection Time: 02-20-2015  1:18 PM  Result Value Ref Range Status   Specimen Description LEFT ANTECUBITAL  Final   Special Requests BOTTLES DRAWN AEROBIC ONLY 8CC  Final   Culture NO GROWTH 5 DAYS  Final   Report Status 02/09/2015 FINAL  Final  Urine culture     Status: None   Collection Time: February 20, 2015  2:20 PM  Result Value Ref Range Status   Specimen Description URINE, CATHETERIZED  Final   Special Requests NONE  Final   Colony Count   Final    >=100,000 COLONIES/ML Performed at [facility]    Culture   Final    ESCHERICHIA COLI KLEBSIELLA PNEUMONIAE Performed at [facility]    Report Status 02/08/2015 FINAL  Final   Organism ID, Bacteria ESCHERICHIA COLI  Final   Organism ID, Bacteria KLEBSIELLA PNEUMONIAE  Final      Susceptibility   Escherichia coli - MIC*    AMPICILLIN >=32 RESISTANT Resistant     CEFAZOLIN <=4 SENSITIVE Sensitive     CEFTRIAXONE <=1 SENSITIVE Sensitive     CIPROFLOXACIN >=4 RESISTANT Resistant     GENTAMICIN >=16 RESISTANT Resistant     LEVOFLOXACIN >=8 RESISTANT Resistant     NITROFURANTOIN <=16 SENSITIVE Sensitive     TOBRAMYCIN >=16 RESISTANT Resistant     TRIMETH/SULFA <=20 SENSITIVE Sensitive     PIP/TAZO 8 SENSITIVE Sensitive     * ESCHERICHIA COLI   Klebsiella pneumoniae - MIC*    AMPICILLIN RESISTANT      CEFAZOLIN <=4 SENSITIVE Sensitive     CEFTRIAXONE <=1 SENSITIVE Sensitive     CIPROFLOXACIN <=0.25 SENSITIVE Sensitive     GENTAMICIN <=1 SENSITIVE Sensitive     LEVOFLOXACIN <=0.12 SENSITIVE Sensitive     NITROFURANTOIN 64 INTERMEDIATE Intermediate     TOBRAMYCIN <=1 SENSITIVE Sensitive     TRIMETH/SULFA <=20 SENSITIVE Sensitive     PIP/TAZO <=4 SENSITIVE Sensitive     * KLEBSIELLA PNEUMONIAE  Culture, Urine     Status: None   Collection Time: 02-20-15  7:08 PM  Result Value Ref Range Status   Specimen Description URINE, CATHETERIZED  Final   Special Requests NONE  Final   Colony Count   Final    >=100,000 COLONIES/ML Performed at [facility]    Culture   Final    Multiple bacterial morphotypes present, none predominant. Suggest appropriate recollection if clinically indicated. Performed at [facility]    Report Status 02/06/2015 FINAL  Final     Studies: No results found.  Scheduled Meds:  Scheduled Meds: . bisoprolol  5 mg Oral Daily  . cholecalciferol  5,000 Units Oral Daily  . colesevelam  1,875 mg Oral BID WC  . feeding supplement (PRO-STAT SUGAR FREE 64)  30 mL Oral Daily  .  furosemide  40 mg Intravenous BID  . gabapentin  100 mg Oral TID  . levothyroxine  50  mcg Oral QAC breakfast  . multivitamin  1 tablet Oral Daily  . pantoprazole  40 mg Oral QHS  . rOPINIRole  0.25 mg Oral TID   Continuous Infusions:   Time spent on care of this patient: 35 min   Jasmin Trumbull, MD 02/12/2015, 12:20 PM  LOS: 8 days   [facility] Office  [phone] Pager - Text Page per [email or web address] If 7PM-7AM, please contact night-coverage [email or web address]

## 2015-02-13 LAB — CBC
HCT: 32.4 % — ABNORMAL LOW (ref 36.0–46.0)
Hemoglobin: 10.4 g/dL — ABNORMAL LOW (ref 12.0–15.0)
MCH: 32.9 pg (ref 26.0–34.0)
MCHC: 32.1 g/dL (ref 30.0–36.0)
MCV: 102.5 fL — ABNORMAL HIGH (ref 78.0–100.0)
Platelets: 49 10*3/uL — ABNORMAL LOW (ref 150–400)
RBC: 3.16 MIL/uL — AB (ref 3.87–5.11)
RDW: 19.4 % — AB (ref 11.5–15.5)
WBC: 4.5 10*3/uL (ref 4.0–10.5)

## 2015-02-13 LAB — BASIC METABOLIC PANEL
Anion gap: 11 (ref 5–15)
BUN: 34 mg/dL — ABNORMAL HIGH (ref 6–20)
CALCIUM: 8.6 mg/dL — AB (ref 8.9–10.3)
CO2: 29 mmol/L (ref 22–32)
CREATININE: 1.36 mg/dL — AB (ref 0.44–1.00)
Chloride: 99 mmol/L — ABNORMAL LOW (ref 101–111)
GFR calc Af Amer: 41 mL/min — ABNORMAL LOW (ref >60)
GFR calc non Af Amer: 35 mL/min — ABNORMAL LOW (ref >60)
GLUCOSE: 59 mg/dL — AB (ref 65–99)
POTASSIUM: 3.8 mmol/L (ref 3.5–5.1)
Sodium: 139 mmol/L (ref 135–145)

## 2015-02-13 LAB — GLUCOSE, CAPILLARY: Glucose-Capillary: 84 mg/dL (ref 65–99)

## 2015-02-13 MED ORDER — POTASSIUM CHLORIDE CRYS ER 20 MEQ PO TBCR
40.0000 meq | EXTENDED_RELEASE_TABLET | Freq: Once | ORAL | Status: AC
  Administered 2015-02-13: 40 meq via ORAL
  Filled 2015-02-13: qty 2

## 2015-02-13 NOTE — Progress Notes (Signed)
[facility] Progress Note   Melanie Camacho ZOX:[id number] DOB: November 16, 1931 DOA: 2015/02/10 PCP: PROVIDER NOT IN SYSTEM  Brief narrative: Melanie Camacho is a 81 y.o. female with CHF (EF 40-45%), possible small vessel CAD (clean cath 4/22), DMII who presented to [facility] as a transfer from a rehab center for AMS. Likely 2/2 acute delirium 2/2 UTI. Found to have septic shock and admitted to ICU. Started on pressors and stress dose steroids.  The patient was hospitalized from 4/28 through 5/24 acute on chronic diastolic heart failure.  Subjective: Patient alert- sitting up in the chair- no complaints of cough, dyspnea, nausea, abdominal pain or constipation.   Assessment/Plan: Principal Problem:   Septic shock/UTI -Urine culture reveals Escherichia coli -Antibiotics started on 5/28- narrowed to Keflex based on sensitivities for a seven-day course completed on 6/3.  Active Problems: Acute encephalopathy on admission -Likely secondary to above and now resolved  Acute respiratory failure-acute on chronic systolic heart failure -EF 40-45% -Chest x-ray reveals bilateral effusions and right basilar infiltrate versus atelectasis -Physical exam reveals edema of the anterior abdominal wall and her arms -BNP 1285 on 6/1-was 840 on 5/28 -Weight when discharged was down to about 95 kg-  -continue to diurese with IV Lasix-  Wt down to 96 kg today  Thrombocytopenia -Platelets still at 49 today - no improvement from yesterday but now further drop-  Eliquis on hold -Discussed with hematology-review of medications reveals that she was given Zyvox and cefepime on 5/28 both of which can cause this -continue to follow platelets daily-once we see they are improving, she can be discharged home -She was bleeding significantly from a skin tear in her right groin- this has resolved after a pressure dressing   AKI on  CKD (chronic kidney disease), stage III -Baseline creatinine is about 1.2--Cr stable now at  1.3  Mildly elevated troponin -On 5/28 troponin was noted to be 0.04>0.05 > 0.04> 0.06 -Likely secondary to acute infection/septic shock-no further management  PAF - Holding Eliquis secondary to thrombocytopenia -Continue bisoprolol  Recent shingles -left upper back-completed treatment with valacyclovir  Hypotension -follow carefully- resumed Bisoprolol to prevent A. fib with RVR  Diabetes mellitus -Diet controlled-follow CBGs  Hypothyroidism -Continue levothyroxine    S/P AVR-(tissue) 2011 at [facility] - no CABG with this    Obesity- BMI 45   Code Status: DO NOT RESUSCITATE Family Communication:  Disposition Plan: Patient refusing to go to skilled nursing-will go home with daughter with home health DVT prophylaxis: Eliquis Consultants: Pulmonary critical care Procedures: Echo 5/17 Left ventricle: The cavity size was normal. Systolic function was mildly to moderately reduced. The estimated ejection fraction was in the range of 40% to 45%. Regional wall motion abnormalities cannot be excluded. - Aortic valve: There was mild regurgitation. - Mitral valve: Calcified annulus. Mildly thickened leaflets . There was mild regurgitation. - Right ventricle: The cavity size was mildly dilated. Systolic function was mildly reduced. - Right atrium: The atrium was mildly dilated. - Pulmonary arteries: PA peak pressure: 32 mm Hg (S). - Pericardium, extracardiac: A trivial pericardial effusion was identified.   Antibiotics: Anti-infectives    Start     Dose/Rate Route Frequency Ordered Stop   02/10/15 2000  cephALEXin (KEFLEX) capsule 500 mg     500 mg Oral Every 8 hours 02/10/15 1212 02/10/15 2050   02/08/15 2000  cephALEXin (KEFLEX) capsule 500 mg  Status:  Discontinued     500 mg Oral Every 8 hours 02/08/15 1715 02/10/15 1212  02/05/15 1800  azithromycin (ZITHROMAX) 250 mg in dextrose 5 % 125 mL IVPB  Status:  Discontinued     250 mg 125 mL/hr over 60 Minutes  Intravenous Every 24 hours 01/15/2015 2022 02/07/15 1148   01/29/2015 2200  linezolid (ZYVOX) IVPB 600 mg  Status:  Discontinued     600 mg 300 mL/hr over 60 Minutes Intravenous Every 12 hours 02/06/2015 2022 02/06/15 1224   02/03/2015 2200  ceFEPIme (MAXIPIME) 2 g in dextrose 5 % 50 mL IVPB  Status:  Discontinued     2 g 100 mL/hr over 30 Minutes Intravenous Every 24 hours 01/19/2015 2057 02/08/15 1715   02/07/2015 1630  linezolid (ZYVOX) IVPB 600 mg  Status:  Discontinued     600 mg 300 mL/hr over 60 Minutes Intravenous  Once 01/18/2015 1620 02/03/2015 2023   01/13/2015 1630  levofloxacin (LEVAQUIN) IVPB 500 mg     500 mg 100 mL/hr over 60 Minutes Intravenous  Once 02/03/2015 1620 01/09/2015 1810   01/13/2015 1500  cefTRIAXone (ROCEPHIN) 1 g in dextrose 5 % 50 mL IVPB     1 g 100 mL/hr over 30 Minutes Intravenous  Once 01/22/2015 1446 01/16/2015 1614      Objective: Filed Weights   02/11/15 0541 02/12/15 0800 02/13/15 0637  Weight: 101.9 kg (224 lb 10.4 oz) 97.8 kg (215 lb 9.8 oz) 96.3 kg (212 lb 4.9 oz)    Intake/Output Summary (Last 24 hours) at 02/13/15 1214 Last data filed at 02/13/15 0900  Gross per 24 hour  Intake    480 ml  Output      0 ml  Net    480 ml     Vitals Filed Vitals:   02/12/15 0800 02/12/15 1400 02/12/15 2115 02/13/15 0637  BP:  111/61 123/80 112/60  Pulse:  65 65   Temp:  98.3 F (36.8 C) 98.3 F (36.8 C) 98.2 F (36.8 C)  TempSrc:  Oral Oral Oral  Resp:  18 19 17   Height:      Weight: 97.8 kg (215 lb 9.8 oz)   96.3 kg (212 lb 4.9 oz)  SpO2:  98% 92% 96%    Exam:  General:  Pt is alert, not in acute distress  HEENT: No icterus, No thrush, oral mucosa moist  Cardiovascular: regular rate and rhythm, S1/S2 No murmur  Respiratory: crackles b/l bases R> L-  Abdomen: Soft, +Bowel sounds, non tender, non distended, no guarding + abdominal wall edema  MSK: No LE edema, cyanosis or clubbing  Data Reviewed: Basic Metabolic Panel:  Recent Labs Lab 02/08/15 0524  02/09/15 0428 02/10/15 0534 02/11/15 0448 02/12/15 0902 02/13/15 0414  NA 141 140 140 141 145 139  K 4.1 4.1 4.3 3.5 4.6 3.8  CL 104 102 102 102 106 99*  CO2 27 25 28 27 25 29   GLUCOSE 78 70 63* 70 41* 59*  BUN 31* 34* 34* 32* 35* 34*  CREATININE 1.63* 1.59* 1.38* 1.35* 1.37* 1.36*  CALCIUM 8.9 8.7* 8.8* 9.0 9.1 8.6*  MG 2.4 2.2 2.1  --   --   --   PHOS 4.1 4.4 4.3  --   --   --    Liver Function Tests:  Recent Labs Lab 02/07/15 0233  AST 34  ALT 16  ALKPHOS 65  BILITOT 1.0  PROT 6.7  ALBUMIN 2.6*   No results for input(s): LIPASE, AMYLASE in the last 168 hours. No results for input(s): AMMONIA in the last 168 hours. CBC:  Recent Labs Lab 02/08/15 0524 02/09/15 0428 02/10/15 0534 02/11/15 0448 02/12/15 0615 02/13/15 0848  WBC 4.5 3.8* 4.1 5.0 5.5 4.5  NEUTROABS 2.5 1.8 2.5 3.3 3.3  --   HGB 10.2* 11.1* 9.9* 10.4* 9.9* 10.4*  HCT 32.7* 36.4 32.0* 33.7* 31.5* 32.4*  MCV 103.8* 106.1* 105.3* 104.3* 102.6* 102.5*  PLT 108* 83* 85* 60* 49* 49*   Cardiac Enzymes: No results for input(s): CKTOTAL, CKMB, CKMBINDEX, TROPONINI in the last 168 hours. BNP (last 3 results)  Recent Labs  01/26/2015 1318 02/08/15 0524 02/10/15 0534  BNP 840.0* 1285.4* 1210.7*    ProBNP (last 3 results) No results for input(s): PROBNP in the last 8760 hours.  CBG:  Recent Labs Lab 02/06/15 2026 02/06/15 2344 02/07/15 0343 02/07/15 0817 02/07/15 1154  GLUCAP 162* 117* 101* 101* 79    Recent Results (from the past 240 hour(s))  Blood culture (routine x 2)     Status: None   Collection Time: 01/18/2015  1:18 PM  Result Value Ref Range Status   Specimen Description LEFT ANTECUBITAL  Final   Special Requests BOTTLES DRAWN AEROBIC ONLY 8CC  Final   Culture NO GROWTH 5 DAYS  Final   Report Status 02/09/2015 FINAL  Final  Urine culture     Status: None   Collection Time: 02/03/2015  2:20 PM  Result Value Ref Range Status   Specimen Description URINE, CATHETERIZED  Final    Special Requests NONE  Final   Colony Count   Final    >=100,000 COLONIES/ML Performed at [facility]    Culture   Final    ESCHERICHIA COLI KLEBSIELLA PNEUMONIAE Performed at [facility]    Report Status 02/08/2015 FINAL  Final   Organism ID, Bacteria ESCHERICHIA COLI  Final   Organism ID, Bacteria KLEBSIELLA PNEUMONIAE  Final      Susceptibility   Escherichia coli - MIC*    AMPICILLIN >=32 RESISTANT Resistant     CEFAZOLIN <=4 SENSITIVE Sensitive     CEFTRIAXONE <=1 SENSITIVE Sensitive     CIPROFLOXACIN >=4 RESISTANT Resistant     GENTAMICIN >=16 RESISTANT Resistant     LEVOFLOXACIN >=8 RESISTANT Resistant     NITROFURANTOIN <=16 SENSITIVE Sensitive     TOBRAMYCIN >=16 RESISTANT Resistant     TRIMETH/SULFA <=20 SENSITIVE Sensitive     PIP/TAZO 8 SENSITIVE Sensitive     * ESCHERICHIA COLI   Klebsiella pneumoniae - MIC*    AMPICILLIN RESISTANT      CEFAZOLIN <=4 SENSITIVE Sensitive     CEFTRIAXONE <=1 SENSITIVE Sensitive     CIPROFLOXACIN <=0.25 SENSITIVE Sensitive     GENTAMICIN <=1 SENSITIVE Sensitive     LEVOFLOXACIN <=0.12 SENSITIVE Sensitive     NITROFURANTOIN 64 INTERMEDIATE Intermediate     TOBRAMYCIN <=1 SENSITIVE Sensitive     TRIMETH/SULFA <=20 SENSITIVE Sensitive     PIP/TAZO <=4 SENSITIVE Sensitive     * KLEBSIELLA PNEUMONIAE  Culture, Urine     Status: None   Collection Time: 01/24/2015  7:08 PM  Result Value Ref Range Status   Specimen Description URINE, CATHETERIZED  Final   Special Requests NONE  Final   Colony Count   Final    >=100,000 COLONIES/ML Performed at [facility]    Culture   Final    Multiple bacterial morphotypes present, none predominant. Suggest appropriate recollection if clinically indicated. Performed at [facility]    Report Status 02/06/2015 FINAL  Final     Studies:  No results found.  Scheduled Meds:  Scheduled Meds: . bisoprolol  5 mg Oral Daily  . cholecalciferol  5,000 Units Oral  Daily  . colesevelam  1,875 mg Oral BID WC  . feeding supplement (PRO-STAT SUGAR FREE 64)  30 mL Oral Daily  . furosemide  40 mg Intravenous BID  . gabapentin  100 mg Oral TID  . levothyroxine  50 mcg Oral QAC breakfast  . multivitamin  1 tablet Oral Daily  . pantoprazole  40 mg Oral QHS  . rOPINIRole  0.25 mg Oral TID   Continuous Infusions:   Time spent on care of this patient: 35 min   Jaedah Lords, MD 02/13/2015, 12:14 PM  LOS: 9 days   [facility] Office  [phone] Pager - Text Page per [email or web address] If 7PM-7AM, please contact night-coverage [email or web address]

## 2015-02-13 NOTE — Progress Notes (Signed)
Inpatient Diabetes Program Recommendations  AACE/ADA: New Consensus Statement on Inpatient Glycemic Control (2013)  Target Ranges:  Prepandial:   less than 140 mg/dL      Peak postprandial:   less than 180 mg/dL (1-2 hours)      Critically ill patients:  140 - 180 mg/dL   Reason for Assessment: Diagnosis of diabetes with no order for CBG's  Diabetes history: Diet-controlled type 2 Outpatient Diabetes medications: None Current orders for Inpatient glycemic control: None    Results for HARMONEE, KREKELER (MRN [id number]) as of 02/13/2015 13:12  Ref. Range 02/09/2015 04:28 02/10/2015 05:34 02/11/2015 04:48 02/12/2015 09:02 02/13/2015 04:14  Glucose Latest Ref Range: 65-99 mg/dL 70 63 (L) 70 41 (LL) 59 (L)    Note: There is currently no order for CBG's.  Glucose values are per lab.  The American Diabetes Association recommends that patients with diabetes have CBG's checked at least four times a day while in the hospital setting.  Request MD order CBG's ac & hs while eating or if patient becomes NPO, every 4 hours.   Thank you.  Cloud Graham S. Elsie Lincoln, RN, CNS, CDE Inpatient Diabetes Program, team pager [phone]

## 2015-02-13 NOTE — Care Management Note (Signed)
Case Management Note  Patient Details  Name: Melanie Camacho MRN: [id number] Date of Birth: Mar 19, 1932  Subjective/Objective:                    Action/Plan: UR updated   Expected Discharge Date:       02-15-15           Expected Discharge Plan:  Home w Home Health Services  In-House Referral:     Discharge planning Services     Post Acute Care Choice:    Choice offered to:     DME Arranged:    DME Agency:     HH Arranged:    HH Agency:     Status of Service:  In process, will continue to follow  Medicare Important Message Given:  Yes Date Medicare IM Given:  02/07/15 Medicare IM give by:  Avie Arenas, RNBSN Date Additional Medicare IM Given:    Additional Medicare Important Message give by:     If discussed at Long Length of Stay Meetings, dates discussed:    Additional Comments:  Kingsley Plan, RN 02/13/2015, 2:13 PM

## 2015-02-14 ENCOUNTER — Inpatient Hospital Stay (HOSPITAL_COMMUNITY): Payer: Medicare Other

## 2015-02-14 DIAGNOSIS — R160 Hepatomegaly, not elsewhere classified: Secondary | ICD-10-CM

## 2015-02-14 DIAGNOSIS — D72819 Decreased white blood cell count, unspecified: Secondary | ICD-10-CM

## 2015-02-14 DIAGNOSIS — R109 Unspecified abdominal pain: Secondary | ICD-10-CM

## 2015-02-14 DIAGNOSIS — I509 Heart failure, unspecified: Secondary | ICD-10-CM

## 2015-02-14 DIAGNOSIS — I4891 Unspecified atrial fibrillation: Secondary | ICD-10-CM

## 2015-02-14 LAB — CBC WITH DIFFERENTIAL/PLATELET
BASOS ABS: 0.1 10*3/uL (ref 0.0–0.1)
BASOS PCT: 2 % — AB (ref 0–1)
Eosinophils Absolute: 0.2 10*3/uL (ref 0.0–0.7)
Eosinophils Relative: 5 % (ref 0–5)
HEMATOCRIT: 30.4 % — AB (ref 36.0–46.0)
HEMOGLOBIN: 9.6 g/dL — AB (ref 12.0–15.0)
LYMPHS ABS: 0.5 10*3/uL — AB (ref 0.7–4.0)
Lymphocytes Relative: 11 % — ABNORMAL LOW (ref 12–46)
MCH: 31.9 pg (ref 26.0–34.0)
MCHC: 31.6 g/dL (ref 30.0–36.0)
MCV: 101 fL — ABNORMAL HIGH (ref 78.0–100.0)
MONO ABS: 1.2 10*3/uL — AB (ref 0.1–1.0)
MONOS PCT: 30 % — AB (ref 3–12)
Neutro Abs: 2.1 10*3/uL (ref 1.7–7.7)
Neutrophils Relative %: 52 % (ref 43–77)
PLATELETS: 42 10*3/uL — AB (ref 150–400)
RBC: 3.01 MIL/uL — AB (ref 3.87–5.11)
RDW: 19.6 % — ABNORMAL HIGH (ref 11.5–15.5)
WBC: 4.1 10*3/uL (ref 4.0–10.5)

## 2015-02-14 LAB — BASIC METABOLIC PANEL
Anion gap: 10 (ref 5–15)
BUN: 34 mg/dL — ABNORMAL HIGH (ref 6–20)
CO2: 29 mmol/L (ref 22–32)
CREATININE: 1.26 mg/dL — AB (ref 0.44–1.00)
Calcium: 8.7 mg/dL — ABNORMAL LOW (ref 8.9–10.3)
Chloride: 101 mmol/L (ref 101–111)
GFR calc non Af Amer: 39 mL/min — ABNORMAL LOW (ref >60)
GFR, EST AFRICAN AMERICAN: 45 mL/min — AB (ref >60)
GLUCOSE: 67 mg/dL (ref 65–99)
Potassium: 3.9 mmol/L (ref 3.5–5.1)
SODIUM: 140 mmol/L (ref 135–145)

## 2015-02-14 LAB — TSH: TSH: 29.161 u[IU]/mL — ABNORMAL HIGH (ref 0.350–4.500)

## 2015-02-14 LAB — CBC
HEMATOCRIT: 30.8 % — AB (ref 36.0–46.0)
HEMOGLOBIN: 9.7 g/dL — AB (ref 12.0–15.0)
MCH: 32.1 pg (ref 26.0–34.0)
MCHC: 31.5 g/dL (ref 30.0–36.0)
MCV: 102 fL — AB (ref 78.0–100.0)
Platelets: 47 10*3/uL — ABNORMAL LOW (ref 150–400)
RBC: 3.02 MIL/uL — AB (ref 3.87–5.11)
RDW: 19.5 % — ABNORMAL HIGH (ref 11.5–15.5)
WBC: 3.5 10*3/uL — ABNORMAL LOW (ref 4.0–10.5)

## 2015-02-14 LAB — CORTISOL: CORTISOL PLASMA: 11.4 ug/dL

## 2015-02-14 LAB — T4, FREE: FREE T4: 1.15 ng/dL — AB (ref 0.61–1.12)

## 2015-02-14 MED ORDER — LEVOTHYROXINE SODIUM 50 MCG PO TABS
75.0000 ug | ORAL_TABLET | Freq: Every day | ORAL | Status: DC
  Administered 2015-02-15 – 2015-02-18 (×4): 75 ug via ORAL
  Filled 2015-02-14 (×8): qty 1

## 2015-02-14 MED ORDER — BISACODYL 10 MG RE SUPP
10.0000 mg | Freq: Once | RECTAL | Status: AC
  Administered 2015-02-14: 10 mg via RECTAL
  Filled 2015-02-14: qty 1

## 2015-02-14 MED ORDER — LEVOTHYROXINE SODIUM 100 MCG IV SOLR: 25.0000 ug | Freq: Every day | INTRAVENOUS | Status: DC

## 2015-02-14 MED ORDER — NYSTATIN 100000 UNIT/GM EX POWD
Freq: Two times a day (BID) | CUTANEOUS | Status: DC
  Administered 2015-02-14 – 2015-02-20 (×12): via TOPICAL
  Filled 2015-02-14 (×2): qty 15

## 2015-02-14 MED ORDER — WHITE PETROLATUM GEL
Status: AC
  Administered 2015-02-14: 0.2
  Filled 2015-02-14: qty 1

## 2015-02-14 MED ORDER — LEVOTHYROXINE SODIUM 100 MCG IV SOLR
25.0000 ug | Freq: Once | INTRAVENOUS | Status: AC
  Administered 2015-02-14: 25 ug via INTRAVENOUS
  Filled 2015-02-14: qty 5

## 2015-02-14 NOTE — Care Management Note (Signed)
Case Management Note  Patient Details  Name: Melanie Camacho MRN: [id number] Date of Birth: 1932/03/23  Subjective/Objective:                    Action/Plan:   Expected Discharge Date:     02-17-15             Expected Discharge Plan:  Home w Home Health Services  In-House Referral:     Discharge planning Services     Post Acute Care Choice:    Choice offered to:     DME Arranged:    DME Agency:     HH Arranged:    HH Agency:     Status of Service:  In process, will continue to follow  Medicare Important Message Given:  Yes Date Medicare IM Given:  02/07/15 Medicare IM give by:  Avie Arenas, RNBSN Date Additional Medicare IM Given:  02/14/15 Additional Medicare Important Message give by:  Ronny Flurry RN BSN   If discussed at Long Length of Stay Meetings, dates discussed:  02-14-15  Additional Comments:  Kingsley Plan, RN 02/14/2015, 10:12 AM

## 2015-02-14 NOTE — Progress Notes (Signed)
Late entry- Notified K. Schorr of patients oral temp 92.9-last night at 0152.  Rectal temp obtained 93.0.  Other vital signs WNL.  Warming blanket obtained and placed on patient.  This am, patient's rectal temp is 94.9 with warming blanket in place. Kirtland Bouchard Schorr notified.  Continue warming blanket.

## 2015-02-14 NOTE — Progress Notes (Addendum)
[facility] Progress Note   Melanie Camacho:[id number] DOB: 1931/09/20 DOA: 02/22/2015 PCP: PROVIDER NOT IN SYSTEM  Brief narrative: Melanie Camacho is a 81 y.o. female with CHF (EF 40-45%), PAF on Eliquis, CKD 3, possible small vessel CAD (clean cath 4/22), DMII who presented to [facility] as a transfer from a rehab center for acute delirium due to a UTI. Found to have septic shock and admitted to ICU. Started on pressors and stress dose steroids.  The patient was hospitalized from 4/28 through 5/24 acute on chronic diastolic heart failure as well .  After being transferred out of the ICU, she was still requiring oxygen with a CXR and exam consistent with pulm edema. I continued to diurese her with IV Lasix and she was eventually able to be weaned off of O2. At the same time her platelets were noted to be be steadily dropping which was determined to be secondary to antibiotics given earlier in the admission. The platelets have not yet shown significant improvement. She was found last night to be hypothermic.   Subjective: Found to be hypothermic last night- patient feels well without complaints of chills, sweats, cough, dysuria, diarrhea or rash.   Assessment/Plan: Principal Problem:   Septic shock/UTI -Urine culture reveals Escherichia coli -Antibiotics started on 5/28- narrowed to Keflex based on sensitivities for a seven-day course completed on 6/3.  Hypothermia - cause uncertain- have ordered blood cultures, UA and thyroid studies - TSH elevated but F t4 also slightly elevated-will give a one time dose of IV Synthroid at 25 mcg and increase Synthroid to 75 mcg - cont Bear hugger and follow temps - check cortisol  Thrombocytopenia - platelet were noted to be steadily dropping during the hospital stay -Discussed with hematology-review of medications reveals that she was given Zyvox and cefepime on 5/28 both of which can cause this -continue to follow platelets daily-once we see they  are improving, she can be discharged home - luckily no bleeding has occured -Platelets still at 47 today  -  Eliquis has been on hold- per hematology, Eliquis can be resumed once platelets found to be improving and greater than 50  Acute metabolic encephalopathy on admission -Likely secondary to sepsis/ UTI and now resolved  Acute respiratory failure-acute on chronic systolic heart failure -EF 40-45% -Chest x-ray reveals bilateral effusions and right basilar infiltrate versus atelectasis -Weight when discharged was down to about 95 kg-  -continue to diurese with IV Lasix-  Wt down to 96 kg today- still has severe pedal edema and abdominal wall edema   AKI on  CKD (chronic kidney disease), stage III -Baseline creatinine is about 1.2--Cr slowly improved with diuresis back to 1.2 today  Mildly elevated troponin -On 5/28 troponin was noted to be 0.04>0.05 > 0.04> 0.06 -Likely secondary to acute infection/septic shock-no further management  PAF - Holding Eliquis secondary to thrombocytopenia -Continue bisoprolol  Recent shingles -left upper back-completed treatment with valacyclovir  Hypotension -follow carefully- resumed Bisoprolol to prevent A. fib with RVR  Diabetes mellitus -Diet controlled-follow CBGs  Hypothyroidism -reviewed TFT results- Continue levothyroxine at current dose-     S/P AVR-(tissue) 2011 at [facility] - no CABG with this    Obesity- BMI 45   Code Status: DO NOT RESUSCITATE Family Communication:  Disposition Plan: Patient refusing to go to skilled nursing-will go home with daughter with home health DVT prophylaxis: Eliquis Consultants: Pulmonary critical care Procedures: Echo 5/17 Left ventricle: The cavity size was normal. Systolic function was mildly to  moderately reduced. The estimated ejection fraction was in the range of 40% to 45%. Regional wall motion abnormalities cannot be excluded. - Aortic valve: There was mild regurgitation. - Mitral  valve: Calcified annulus. Mildly thickened leaflets . There was mild regurgitation. - Right ventricle: The cavity size was mildly dilated. Systolic function was mildly reduced. - Right atrium: The atrium was mildly dilated. - Pulmonary arteries: PA peak pressure: 32 mm Hg (S). - Pericardium, extracardiac: A trivial pericardial effusion was identified.   Antibiotics: Anti-infectives    Start     Dose/Rate Route Frequency Ordered Stop   02/10/15 2000  cephALEXin (KEFLEX) capsule 500 mg     500 mg Oral Every 8 hours 02/10/15 1212 02/10/15 2050   02/08/15 2000  cephALEXin (KEFLEX) capsule 500 mg  Status:  Discontinued     500 mg Oral Every 8 hours 02/08/15 1715 02/10/15 1212   02/05/15 1800  azithromycin (ZITHROMAX) 250 mg in dextrose 5 % 125 mL IVPB  Status:  Discontinued     250 mg 125 mL/hr over 60 Minutes Intravenous Every 24 hours 01/31/2015 2022 02/07/15 1148   01/15/2015 2200  linezolid (ZYVOX) IVPB 600 mg  Status:  Discontinued     600 mg 300 mL/hr over 60 Minutes Intravenous Every 12 hours 01/31/2015 2022 02/06/15 1224   01/26/2015 2200  ceFEPIme (MAXIPIME) 2 g in dextrose 5 % 50 mL IVPB  Status:  Discontinued     2 g 100 mL/hr over 30 Minutes Intravenous Every 24 hours 01/11/2015 2057 02/08/15 1715   01/19/2015 1630  linezolid (ZYVOX) IVPB 600 mg  Status:  Discontinued     600 mg 300 mL/hr over 60 Minutes Intravenous  Once 02/07/2015 1620 01/24/2015 2023   02/05/2015 1630  levofloxacin (LEVAQUIN) IVPB 500 mg     500 mg 100 mL/hr over 60 Minutes Intravenous  Once 02/02/2015 1620 01/18/2015 1810   02/02/2015 1500  cefTRIAXone (ROCEPHIN) 1 g in dextrose 5 % 50 mL IVPB     1 g 100 mL/hr over 30 Minutes Intravenous  Once 01/23/2015 1446 02/05/2015 1614      Objective: Filed Weights   02/11/15 0541 02/12/15 0800 02/13/15 0637  Weight: 101.9 kg (224 lb 10.4 oz) 97.8 kg (215 lb 9.8 oz) 96.3 kg (212 lb 4.9 oz)    Intake/Output Summary (Last 24 hours) at 02/14/15 1131 Last data filed at 02/13/15  1300  Gross per 24 hour  Intake    240 ml  Output      0 ml  Net    240 ml     Vitals Filed Vitals:   02/14/15 0152 02/14/15 0215 02/14/15 0604 02/14/15 0644  BP: 107/50  121/57   Pulse: 59  107   Temp: 92.9 F (33.8 C) 93 F (33.9 C) 93.9 F (34.4 C) 94.9 F (34.9 C)  TempSrc: Oral Rectal Oral Rectal  Resp: 20  18   Height:      Weight:      SpO2: 99%  92%     Exam:  General:  Pt is alert, not in acute distress  HEENT: No icterus, No thrush, oral mucosa moist  Cardiovascular: regular rate and rhythm, S1/S2 No murmur  Respiratory: crackles b/l bases R> L-  Abdomen: Soft, +Bowel sounds, non tender, non distended, no guarding + abdominal wall edema  MSK: No LE edema, cyanosis or clubbing  Data Reviewed: Basic Metabolic Panel:  Recent Labs Lab 02/08/15 0524 02/09/15 0428 02/10/15 0534 02/11/15 0448 02/12/15 0902 02/13/15 0414  02/14/15 0453  NA 141 140 140 141 145 139 140  K 4.1 4.1 4.3 3.5 4.6 3.8 3.9  CL 104 102 102 102 106 99* 101  CO2 27 25 28 27 25 29 29   GLUCOSE 78 70 63* 70 41* 59* 67  BUN 31* 34* 34* 32* 35* 34* 34*  CREATININE 1.63* 1.59* 1.38* 1.35* 1.37* 1.36* 1.26*  CALCIUM 8.9 8.7* 8.8* 9.0 9.1 8.6* 8.7*  MG 2.4 2.2 2.1  --   --   --   --   PHOS 4.1 4.4 4.3  --   --   --   --    Liver Function Tests: No results for input(s): AST, ALT, ALKPHOS, BILITOT, PROT, ALBUMIN in the last 168 hours. No results for input(s): LIPASE, AMYLASE in the last 168 hours. No results for input(s): AMMONIA in the last 168 hours. CBC:  Recent Labs Lab 02/09/15 0428 02/10/15 0534 02/11/15 0448 02/12/15 0615 02/13/15 0848 02/14/15 0453 02/14/15 1005  WBC 3.8* 4.1 5.0 5.5 4.5 4.1 3.5*  NEUTROABS 1.8 2.5 3.3 3.3  --  2.1  --   HGB 11.1* 9.9* 10.4* 9.9* 10.4* 9.6* 9.7*  HCT 36.4 32.0* 33.7* 31.5* 32.4* 30.4* 30.8*  MCV 106.1* 105.3* 104.3* 102.6* 102.5* 101.0* 102.0*  PLT 83* 85* 60* 49* 49* 42* 47*   Cardiac Enzymes: No results for input(s): CKTOTAL,  CKMB, CKMBINDEX, TROPONINI in the last 168 hours. BNP (last 3 results)  Recent Labs  2015/02/07 1318 02/08/15 0524 02/10/15 0534  BNP 840.0* 1285.4* 1210.7*    ProBNP (last 3 results) No results for input(s): PROBNP in the last 8760 hours.  CBG:  Recent Labs Lab 02/07/15 1154 02/13/15 2150  GLUCAP 79 84    Recent Results (from the past 240 hour(s))  Blood culture (routine x 2)     Status: None   Collection Time: 02/07/2015  1:18 PM  Result Value Ref Range Status   Specimen Description LEFT ANTECUBITAL  Final   Special Requests BOTTLES DRAWN AEROBIC ONLY 8CC  Final   Culture NO GROWTH 5 DAYS  Final   Report Status 02/09/2015 FINAL  Final  Urine culture     Status: None   Collection Time: 02-07-2015  2:20 PM  Result Value Ref Range Status   Specimen Description URINE, CATHETERIZED  Final   Special Requests NONE  Final   Colony Count   Final    >=100,000 COLONIES/ML Performed at [facility]    Culture   Final    ESCHERICHIA COLI KLEBSIELLA PNEUMONIAE Performed at [facility]    Report Status 02/08/2015 FINAL  Final   Organism ID, Bacteria ESCHERICHIA COLI  Final   Organism ID, Bacteria KLEBSIELLA PNEUMONIAE  Final      Susceptibility   Escherichia coli - MIC*    AMPICILLIN >=32 RESISTANT Resistant     CEFAZOLIN <=4 SENSITIVE Sensitive     CEFTRIAXONE <=1 SENSITIVE Sensitive     CIPROFLOXACIN >=4 RESISTANT Resistant     GENTAMICIN >=16 RESISTANT Resistant     LEVOFLOXACIN >=8 RESISTANT Resistant     NITROFURANTOIN <=16 SENSITIVE Sensitive     TOBRAMYCIN >=16 RESISTANT Resistant     TRIMETH/SULFA <=20 SENSITIVE Sensitive     PIP/TAZO 8 SENSITIVE Sensitive     * ESCHERICHIA COLI   Klebsiella pneumoniae - MIC*    AMPICILLIN RESISTANT      CEFAZOLIN <=4 SENSITIVE Sensitive     CEFTRIAXONE <=1 SENSITIVE Sensitive     CIPROFLOXACIN <=0.25 SENSITIVE  Sensitive     GENTAMICIN <=1 SENSITIVE Sensitive     LEVOFLOXACIN <=0.12 SENSITIVE Sensitive      NITROFURANTOIN 64 INTERMEDIATE Intermediate     TOBRAMYCIN <=1 SENSITIVE Sensitive     TRIMETH/SULFA <=20 SENSITIVE Sensitive     PIP/TAZO <=4 SENSITIVE Sensitive     * KLEBSIELLA PNEUMONIAE  Culture, Urine     Status: None   Collection Time: 01/20/2015  7:08 PM  Result Value Ref Range Status   Specimen Description URINE, CATHETERIZED  Final   Special Requests NONE  Final   Colony Count   Final    >=100,000 COLONIES/ML Performed at [facility]    Culture   Final    Multiple bacterial morphotypes present, none predominant. Suggest appropriate recollection if clinically indicated. Performed at [facility]    Report Status 02/06/2015 FINAL  Final     Studies: No results found.  Scheduled Meds:  Scheduled Meds: . bisoprolol  5 mg Oral Daily  . cholecalciferol  5,000 Units Oral Daily  . colesevelam  1,875 mg Oral BID WC  . furosemide  40 mg Intravenous BID  . gabapentin  100 mg Oral TID  . levothyroxine  50 mcg Oral QAC breakfast  . multivitamin  1 tablet Oral Daily  . nystatin   Topical BID  . pantoprazole  40 mg Oral QHS  . rOPINIRole  0.25 mg Oral TID   Continuous Infusions:   Time spent on care of this patient: 35 min   Chianti Goh, MD 02/14/2015, 11:31 AM  LOS: 10 days   [facility] Office  [phone] Pager - Text Page per [email or web address] If 7PM-7AM, please contact night-coverage [email or web address]

## 2015-02-14 NOTE — Progress Notes (Signed)
Melanie Camacho   DOB:1932-03-08   ZO#:[id number]   JWJ#:[id number]  Patient Care Team: Provider Not In System as PCP - General I have seen the patient, examined her and edited the notes as follows  Subjective: Denies fevers (she is in fact hypothermic),chills, night sweats, vision changes, or mucositis. Denies any respiratory complaints. Denies any chest pain or palpitations. Denies lower extremity swelling. Denies nausea, heartburn or change in bowel habits. Appetite is normal. Denies any dysuria. Denies abnormal skin rashes, or neuropathy. Denies any bleeding issues such as epistaxis, hematemesis, hematuria or hematochezia.  Complained of intermittent right lower abdominal pain   Scheduled Meds: . bisoprolol  5 mg Oral Daily  . cholecalciferol  5,000 Units Oral Daily  . colesevelam  1,875 mg Oral BID WC  . furosemide  40 mg Intravenous BID  . gabapentin  100 mg Oral TID  . levothyroxine  50 mcg Oral QAC breakfast  . multivitamin  1 tablet Oral Daily  . nystatin   Topical BID  . pantoprazole  40 mg Oral QHS  . rOPINIRole  0.25 mg Oral TID   Continuous Infusions:  PRN Meds:.sodium chloride, HYDROcodone-acetaminophen, ibuprofen, lactulose  Objective:  Filed Vitals:   02/14/15 0644  BP:   Pulse:   Temp: 94.9 F (34.9 C)  Resp:       Intake/Output Summary (Last 24 hours) at 02/14/15 1256 Last data filed at 02/13/15 1300  Gross per 24 hour  Intake    240 ml  Output      0 ml  Net    240 ml    GENERAL:alert, no distress and comfortable SKIN: significant skin bruising EYES: normal, Conjunctiva are pink and non-injected, sclera clear Musculoskeletal:no cyanosis of digits and no clubbing  NEURO: alert & oriented x 3 with fluent speech, no focal motor/sensory deficits ABDOMEN: soft, skin thickening from chronic anarsarca. Severe tenderness on deep RLQ without rebound or guarding  CBG (last 3)   Recent Labs  02/13/15 2150  GLUCAP 84     Labs:   Recent Labs Lab  02/09/15 0428 02/10/15 0534 02/11/15 0448 02/12/15 0615 02/13/15 0848 02/14/15 0453 02/14/15 1005  WBC 3.8* 4.1 5.0 5.5 4.5 4.1 3.5*  HGB 11.1* 9.9* 10.4* 9.9* 10.4* 9.6* 9.7*  HCT 36.4 32.0* 33.7* 31.5* 32.4* 30.4* 30.8*  PLT 83* 85* 60* 49* 49* 42* 47*  MCV 106.1* 105.3* 104.3* 102.6* 102.5* 101.0* 102.0*  MCH 32.4 32.6 32.2 32.2 32.9 31.9 32.1  MCHC 30.5 30.9 30.9 31.4 32.1 31.6 31.5  RDW 20.3* 20.4* 20.1* 20.2* 19.4* 19.6* 19.5*  LYMPHSABS 0.7 0.6* 0.6* 0.4*  --  0.5*  --   MONOABS 1.0 0.7 0.7 1.3*  --  1.2*  --   EOSABS 0.3 0.3 0.3 0.4  --  0.2  --   BASOSABS 0.0 0.0 0.0 0.1  --  0.1  --      Chemistries:    Recent Labs Lab 02/08/15 0524 02/09/15 0428 02/10/15 0534 02/11/15 0448 02/12/15 0902 02/13/15 0414 02/14/15 0453  NA 141 140 140 141 145 139 140  K 4.1 4.1 4.3 3.5 4.6 3.8 3.9  CL 104 102 102 102 106 99* 101  CO2 GLUCOSE 78 70 63* 70 41* 59* 67  BUN 31* 34* 34* 32* 35* 34* 34*  CREATININE 1.63* 1.59* 1.38* 1.35* 1.37* 1.36* 1.26*  CALCIUM 8.9 8.7* 8.8* 9.0 9.1 8.6* 8.7*  MG 2.4 2.2 2.1  --   --   --   --  GFR Estimated Creatinine Clearance: 37.1 mL/min (by C-G formula based on Cr of 1.26).   Coagulation profile  Recent Labs Lab 02/08/15 0524  INR 2.11*   Thyroid function studies  Recent Labs  02/14/15 0845  TSH 29.161*   Microbiology Positive for K Pneumoniae and E Coli    Imaging Studies:  No results found.  Assessment/Plan: 81 y.o.  Progressive thrombocytopenia She may have chronic mild thrombocytopenia from significant hepatomegaly, from chronic congestion related to congestive heart failure. However, the recent drop of platelet count was likely related to recent exposure to cefepime and Linezolid. Both antibiotics were discontinued. Recommend holding off further anticoagulation therapy with Eliquis until we can document her platelet count is greater than 50,000 Platelet count drop has nadired,hopefully  can discharge soon if rebound to >50,000 tomorrow. She does not need platelet transfusion unless her platelet count dropped to less than 20,000.  Anemia in chronic disease Her hemoglobin has stayed stable around 10 g. Current Hb is at 9.7  There is no indication to do any further workup and she would not need any blood transfusion for now. Her macrocytosis in with associated anemia secondarily related to her liver congestion and possibly recent bleeding from her wound  Leukopenia, mild Likely due to antibiotics in the setting of E  Coli  And K. Pneumoniae UTI. Currently on Keflex Initially resolved, now slightly dropped to 3.5 She is afebrile Continue to monitor, no intervention is indicated at this time.  Atrial fibrillation on chronic anticoagulation therapy Certainly, she would benefit from chronic anticoagulation therapy to prevent stroke. Recommend holding anticoagulation therapy and to platelet rebound to greater than 50,000  Abdominal pain Abdomen Xray ordered  Discharge planning If repeat CBC showed improvement of platelet count, she could be discharged. Tentative appointment to see her back inDr. Lindsay Straka's office has been made  on 02/27/2015 at 1 PM.  DNR  Other medical issues including septic shock, DM II, renal disease, atrial fibrillation as per admitting team     [facility] E, PA-C 02/14/2015  12:56 PM Melanie Beilke, MD 02/14/2015

## 2015-02-14 NOTE — Progress Notes (Signed)
Nutrition Follow-up  DOCUMENTATION CODES:  Morbid obesity  INTERVENTION:  MVI  NUTRITION DIAGNOSIS:  Increased nutrient needs related to wound healing as evidenced by estimated needs.  Ongoing  GOAL:  Patient will meet greater than or equal to 90% of their needs  Met  MONITOR:  PO intake, Supplement acceptance, Labs, Weight trends, I & O's, Skin  REASON FOR ASSESSMENT:  Malnutrition Screening Tool    ASSESSMENT: 58 F with CHF (EF 40-45%), possible small vessel CAD (clean cath 4/22), DMII who [facility] as a transfer from a rehab center for AMS. Likely 2/2 acute delirium 2/2 UTI.  Pt in bathroom, assisted by nurse tech at time of visit.   Appetite continues to be good. Noted 50-100% meal completion. Pt is refusing Prostat supplement. RD will continue d/c, but continue MVI.   Noted pt has experienced a 30# wt loss over the past 6 days due to aggressive diuresis. Nutritional needs re-adjusted due to significant wt change.   RNCM following. Discharge disposition is home with home health once medically stable. Pt is refusing SNF placement.   Height:  Ht Readings from Last 1 Encounters:  02/05/15 _0  (1.549 m)    Weight:  Wt Readings from Last 1 Encounters:  02/13/15 212 lb 4.9 oz (96.3 kg)    Ideal Body Weight:  47.7 kg  Wt Readings from Last 10 Encounters:  02/13/15 212 lb 4.9 oz (96.3 kg)  01/26/15 210 lb 3.2 oz (95.346 kg)  12/30/14 230 lb 9.6 oz (104.599 kg)  12/29/13 193 lb 5.5 oz (87.7 kg)    BMI:  Body mass index is 40.14 kg/(m^2).  Estimated Nutritional Needs:  Kcal:  1800-2000  Protein:  90-100 grams  Fluid:  1.8-2.0 L  Skin:  Wound (see comment) (DM ulcers on rt and lt lower legs)  Diet Order:  Diet Heart Room service appropriate?: Yes; Fluid consistency:: Thin  EDUCATION NEEDS:  Education needs addressed   Intake/Output Summary (Last 24 hours) at 02/14/15 1048 Last data filed at 02/13/15 1300  Gross per 24 hour  Intake     240 ml  Output      0 ml  Net    240 ml    Last BM:  02/14/15  Maurion Walkowiak A. Jimmye Norman, RD, LDN, CDE Pager: [phone] After hours Pager: [phone]

## 2015-02-15 DIAGNOSIS — E162 Hypoglycemia, unspecified: Secondary | ICD-10-CM

## 2015-02-15 DIAGNOSIS — T68XXXA Hypothermia, initial encounter: Secondary | ICD-10-CM | POA: Insufficient documentation

## 2015-02-15 LAB — BASIC METABOLIC PANEL
ANION GAP: 12 (ref 5–15)
BUN: 33 mg/dL — AB (ref 6–20)
CALCIUM: 8.8 mg/dL — AB (ref 8.9–10.3)
CHLORIDE: 98 mmol/L — AB (ref 101–111)
CO2: 28 mmol/L (ref 22–32)
Creatinine, Ser: 1.32 mg/dL — ABNORMAL HIGH (ref 0.44–1.00)
GFR calc Af Amer: 43 mL/min — ABNORMAL LOW (ref >60)
GFR calc non Af Amer: 37 mL/min — ABNORMAL LOW (ref >60)
Glucose, Bld: 48 mg/dL — ABNORMAL LOW (ref 65–99)
Potassium: 3.8 mmol/L (ref 3.5–5.1)
Sodium: 138 mmol/L (ref 135–145)

## 2015-02-15 LAB — CBC WITH DIFFERENTIAL/PLATELET
BASOS PCT: 1 % (ref 0–1)
Basophils Absolute: 0 10*3/uL (ref 0.0–0.1)
Eosinophils Absolute: 0.3 10*3/uL (ref 0.0–0.7)
Eosinophils Relative: 7 % — ABNORMAL HIGH (ref 0–5)
HEMATOCRIT: 30 % — AB (ref 36.0–46.0)
Hemoglobin: 9.5 g/dL — ABNORMAL LOW (ref 12.0–15.0)
LYMPHS ABS: 0.6 10*3/uL — AB (ref 0.7–4.0)
Lymphocytes Relative: 15 % (ref 12–46)
MCH: 32.5 pg (ref 26.0–34.0)
MCHC: 31.7 g/dL (ref 30.0–36.0)
MCV: 102.7 fL — ABNORMAL HIGH (ref 78.0–100.0)
MONO ABS: 1.2 10*3/uL — AB (ref 0.1–1.0)
Monocytes Relative: 30 % — ABNORMAL HIGH (ref 3–12)
NEUTROS ABS: 1.9 10*3/uL (ref 1.7–7.7)
NEUTROS PCT: 48 % (ref 43–77)
Platelets: 43 10*3/uL — ABNORMAL LOW (ref 150–400)
RBC: 2.92 MIL/uL — ABNORMAL LOW (ref 3.87–5.11)
RDW: 19.5 % — AB (ref 11.5–15.5)
WBC: 4.1 10*3/uL (ref 4.0–10.5)

## 2015-02-15 LAB — URINALYSIS, ROUTINE W REFLEX MICROSCOPIC
Bilirubin Urine: NEGATIVE
GLUCOSE, UA: NEGATIVE mg/dL
Hgb urine dipstick: NEGATIVE
Ketones, ur: NEGATIVE mg/dL
Nitrite: NEGATIVE
PH: 5 (ref 5.0–8.0)
PROTEIN: NEGATIVE mg/dL
SPECIFIC GRAVITY, URINE: 1.013 (ref 1.005–1.030)
UROBILINOGEN UA: 0.2 mg/dL (ref 0.0–1.0)

## 2015-02-15 LAB — GLUCOSE, CAPILLARY
Glucose-Capillary: 103 mg/dL — ABNORMAL HIGH (ref 65–99)
Glucose-Capillary: 77 mg/dL (ref 65–99)
Glucose-Capillary: 90 mg/dL (ref 65–99)

## 2015-02-15 LAB — URINE MICROSCOPIC-ADD ON

## 2015-02-15 MED ORDER — ACETAMINOPHEN 325 MG PO TABS
650.0000 mg | ORAL_TABLET | Freq: Four times a day (QID) | ORAL | Status: DC | PRN
  Administered 2015-02-15: 650 mg via ORAL
  Filled 2015-02-15: qty 2

## 2015-02-15 MED ORDER — BISOPROLOL FUMARATE 5 MG PO TABS
5.0000 mg | ORAL_TABLET | Freq: Every day | ORAL | Status: DC
  Administered 2015-02-16 – 2015-02-18 (×3): 5 mg via ORAL
  Filled 2015-02-15 (×3): qty 1

## 2015-02-15 NOTE — Progress Notes (Signed)
[facility] Progress Note   JENNIFERMARIE FRANZEN ZOX:[id number] DOB: August 17, 1932 DOA: 02-22-15 PCP: PROVIDER NOT IN SYSTEM  Brief narrative: Melanie Camacho is a 81 y.o. female with CHF (EF 40-45%), PAF on Eliquis, CKD 3, possible small vessel CAD (clean cath 4/22), DMII who presented to [facility] as a transfer from a rehab center for acute delirium due to a UTI. Found to have septic shock and admitted to ICU. Started on pressors and stress dose steroids.  The patient was hospitalized from 4/28 through 5/24 acute on chronic diastolic heart failure as well .  After being transferred out of the ICU, she was still requiring oxygen with a CXR and exam consistent with pulm edema. I continued to diurese her with IV Lasix and she was eventually able to be weaned off of O2. At the same time her platelets were noted to be be steadily dropping which was determined to be secondary to antibiotics given earlier in the admission. The platelets have not yet shown significant improvement. She was found last night to be hypothermic.   Subjective: Feeling better, wants to go home.  Was asymptomatic this am with low blood sugar.  Breathing better  Assessment/Plan:   Septic shock/UTI -Urine culture reveals Escherichia coli -Antibiotics started on 5/28- narrowed to Keflex based on sensitivities for a seven-day course completed on 6/3.  Hypothermia; improved.  - cause uncertain- will correct hypothyroidism. Might be related to hypoglycemia.  - TSH elevated but F t4 also slightly elevated- Synthroid increase  to 75 mcg -cortisol normal. Blood culture no growth to date.   Hypoglycemia:  Will order CBG, if hypoglycemia persist will need to order insulin level, c peptide level.   Thrombocytopenia - platelet were noted to be steadily dropping during the hospital stay -Discussed with hematology-review of medications reveals that she was given Zyvox and cefepime on 5/28 both of which can cause this -continue to follow  platelets daily-once we see they are improving, she can be discharged home -Platelets still at 43 today , no evidence of active bleeding.  -  Eliquis has been on hold- per hematology, Eliquis can be resumed once platelets found to be improving and greater than 50  Acute metabolic encephalopathy on admission -Likely secondary to sepsis/ UTI and now resolved  Acute respiratory failure-acute on chronic systolic heart failure -EF 40-45% -Chest x-ray reveals bilateral effusions and right basilar infiltrate versus atelectasis -Weight when discharged was down to about 95 kg-  -continue to diurese with IV Lasix- Will consider change lasix to oral on 6-9 Weight: 104--101-----96---98   AKI on  CKD (chronic kidney disease), stage III -Baseline creatinine is about 1.2-- -follow on lasix.   Mildly elevated troponin -On 5/28 troponin was noted to be 0.04>0.05 > 0.04> 0.06 -Likely secondary to acute infection/septic shock-no further management  PAF - Holding Eliquis secondary to thrombocytopenia -Continue bisoprolol  Recent shingles -left upper back-completed treatment with valacyclovir  Hypotension -follow carefully- resumed Bisoprolol to prevent A. fib with RVR -holder parameter for bisoprolol/   Diabetes mellitus -Diet controlled-follow CBGs -low blood sugar, order snacks.   Hypothyroidism -TSH markedly elevated at 29. T 4 at 1.1. -synthroid increase to 75 mcg.  -she will need follow up TSH.   S/P AVR-(tissue) 2011 at [facility] - no CABG with this  Obesity- BMI 45   Code Status: DO NOT RESUSCITATE Family Communication: care discussed with daughter Disposition Plan: Patient refusing to go to skilled nursing-will go home with daughter with home health. Plan to discharge  6-10 DVT prophylaxis: holding eliquis due to thrombocytopenia.  Consultants: Pulmonary critical care Procedures: Echo 5/17 Left ventricle: The cavity size was normal. Systolic function was mildly to moderately  reduced. The estimated ejection fraction was in the range of 40% to 45%. Regional wall motion abnormalities cannot be excluded. - Aortic valve: There was mild regurgitation. - Mitral valve: Calcified annulus. Mildly thickened leaflets . There was mild regurgitation. - Right ventricle: The cavity size was mildly dilated. Systolic function was mildly reduced. - Right atrium: The atrium was mildly dilated. - Pulmonary arteries: PA peak pressure: 32 mm Hg (S). - Pericardium, extracardiac: A trivial pericardial effusion was identified.   Antibiotics: Anti-infectives    Start     Dose/Rate Route Frequency Ordered Stop   02/10/15 2000  cephALEXin (KEFLEX) capsule 500 mg     500 mg Oral Every 8 hours 02/10/15 1212 02/10/15 2050   02/08/15 2000  cephALEXin (KEFLEX) capsule 500 mg  Status:  Discontinued     500 mg Oral Every 8 hours 02/08/15 1715 02/10/15 1212   02/05/15 1800  azithromycin (ZITHROMAX) 250 mg in dextrose 5 % 125 mL IVPB  Status:  Discontinued     250 mg 125 mL/hr over 60 Minutes Intravenous Every 24 hours 02/13/2015 2022 02/07/15 1148   Feb 13, 2015 2200  linezolid (ZYVOX) IVPB 600 mg  Status:  Discontinued     600 mg 300 mL/hr over 60 Minutes Intravenous Every 12 hours 02/13/15 2022 02/06/15 1224   02/13/15 2200  ceFEPIme (MAXIPIME) 2 g in dextrose 5 % 50 mL IVPB  Status:  Discontinued     2 g 100 mL/hr over 30 Minutes Intravenous Every 24 hours 02/13/2015 2057 02/08/15 1715   13-Feb-2015 1630  linezolid (ZYVOX) IVPB 600 mg  Status:  Discontinued     600 mg 300 mL/hr over 60 Minutes Intravenous  Once 02/13/2015 1620 02/13/2015 2023   02/13/15 1630  levofloxacin (LEVAQUIN) IVPB 500 mg     500 mg 100 mL/hr over 60 Minutes Intravenous  Once 2015-02-13 1620 02-13-2015 1810   2015-02-13 1500  cefTRIAXone (ROCEPHIN) 1 g in dextrose 5 % 50 mL IVPB     1 g 100 mL/hr over 30 Minutes Intravenous  Once 2015-02-13 1446 13-Feb-2015 1614      Objective: Filed Weights   02/12/15 0800 02/13/15  0637 02/14/15 2200  Weight: 97.8 kg (215 lb 9.8 oz) 96.3 kg (212 lb 4.9 oz) 98.8 kg (217 lb 13 oz)    Intake/Output Summary (Last 24 hours) at 02/15/15 1318 Last data filed at 02/15/15 0900  Gross per 24 hour  Intake    360 ml  Output      0 ml  Net    360 ml     Vitals Filed Vitals:   02/14/15 2100 02/14/15 2200 02/15/15 0508 02/15/15 0510  BP: 80/48 104/39 87/48   Pulse: 67 58 95   Temp: 97.4 F (36.3 C) 94.3 F (34.6 C) 97.8 F (36.6 C)   TempSrc: Oral Oral Axillary   Resp: 18 16 16    Height:      Weight:  98.8 kg (217 lb 13 oz)    SpO2: 90% 100% 82% 94%    Exam:  General:  Pt is alert, not in acute distress  HEENT: No icterus, No thrush, oral mucosa moist  Cardiovascular: regular rate and rhythm, S1/S2 No murmur  Respiratory: crackles b/l bases R> L-  Abdomen: Soft, +Bowel sounds, non tender, non distended, no guarding + abdominal wall edema  MSK: bilateral LE edema.   Data Reviewed: Basic Metabolic Panel:  Recent Labs Lab 02/09/15 0428 02/10/15 0534 02/11/15 0448 02/12/15 0902 02/13/15 0414 02/14/15 0453 02/15/15 0557  NA 140 140 141 145 139 140 138  K 4.1 4.3 3.5 4.6 3.8 3.9 3.8  CL 102 102 102 106 99* 101 98*  CO2 GLUCOSE 70 63* 70 41* 59* 67 48*  BUN 34* 34* 32* 35* 34* 34* 33*  CREATININE 1.59* 1.38* 1.35* 1.37* 1.36* 1.26* 1.32*  CALCIUM 8.7* 8.8* 9.0 9.1 8.6* 8.7* 8.8*  MG 2.2 2.1  --   --   --   --   --   PHOS 4.4 4.3  --   --   --   --   --    Liver Function Tests: No results for input(s): AST, ALT, ALKPHOS, BILITOT, PROT, ALBUMIN in the last 168 hours. No results for input(s): LIPASE, AMYLASE in the last 168 hours. No results for input(s): AMMONIA in the last 168 hours. CBC:  Recent Labs Lab 02/10/15 0534 02/11/15 0448 02/12/15 0615 02/13/15 0848 02/14/15 0453 02/14/15 1005 02/15/15 0557  WBC 4.1 5.0 5.5 4.5 4.1 3.5* 4.1  NEUTROABS 2.5 3.3 3.3  --  2.1  --  1.9  HGB 9.9* 10.4* 9.9* 10.4* 9.6* 9.7*  9.5*  HCT 32.0* 33.7* 31.5* 32.4* 30.4* 30.8* 30.0*  MCV 105.3* 104.3* 102.6* 102.5* 101.0* 102.0* 102.7*  PLT 85* 60* 49* 49* 42* 47* 43*   Cardiac Enzymes: No results for input(s): CKTOTAL, CKMB, CKMBINDEX, TROPONINI in the last 168 hours. BNP (last 3 results)  Recent Labs  Feb 12, 2015 1318 02/08/15 0524 02/10/15 0534  BNP 840.0* 1285.4* 1210.7*    ProBNP (last 3 results) No results for input(s): PROBNP in the last 8760 hours.  CBG:  Recent Labs Lab 02/13/15 2150 02/15/15 1309  GLUCAP 84 90    Recent Results (from the past 240 hour(s))  Culture, blood (routine x 2)     Status: None (Preliminary result)   Collection Time: 02/14/15  8:40 AM  Result Value Ref Range Status   Specimen Description BLOOD LEFT ARM  Final   Special Requests BOTTLES DRAWN AEROBIC ONLY 2CC  Final   Culture   Final           BLOOD CULTURE RECEIVED NO GROWTH TO DATE CULTURE WILL BE HELD FOR 5 DAYS BEFORE ISSUING A FINAL NEGATIVE REPORT Performed at [facility]    Report Status PENDING  Incomplete  Culture, blood (routine x 2)     Status: None (Preliminary result)   Collection Time: 02/14/15  8:45 AM  Result Value Ref Range Status   Specimen Description BLOOD RIGHT ARM  Final   Special Requests BOTTLES DRAWN AEROBIC ONLY 3CC  Final   Culture   Final           BLOOD CULTURE RECEIVED NO GROWTH TO DATE CULTURE WILL BE HELD FOR 5 DAYS BEFORE ISSUING A FINAL NEGATIVE REPORT Performed at [facility]    Report Status PENDING  Incomplete     Studies: Dg Abd 1 View  02/14/2015   CLINICAL DATA:  81 year old female with abdominal pain and history of multiple prior abdominal surgeries.  EXAM: ABDOMEN - 1 VIEW  COMPARISON:  Prior abdominal radiograph 02/05/2015; prior CT abdomen/ pelvis 01/06/2015  FINDINGS: A total of 3 frontal radiographs were obtained to encompass the entirety of the abdomen. No evidence of a bowel obstruction. No large  free air on this supine series. Extensive surgical  changes are present with multifocal regions of chain sutures and surgical clips in the right upper quadrant. Multilevel degenerative disc disease throughout the visualized thoracic and lumbar spine.  IMPRESSION: Nonobstructed bowel gas pattern.   Electronically Signed   By: Malachy Moan M.D.   On: 02/14/2015 18:18    Scheduled Meds:  Scheduled Meds: . [START ON 02/16/2015] bisoprolol  5 mg Oral Daily  . cholecalciferol  5,000 Units Oral Daily  . colesevelam  1,875 mg Oral BID WC  . furosemide  40 mg Intravenous BID  . gabapentin  100 mg Oral TID  . levothyroxine  75 mcg Oral QAC breakfast  . multivitamin  1 tablet Oral Daily  . nystatin   Topical BID  . pantoprazole  40 mg Oral QHS  . rOPINIRole  0.25 mg Oral TID   Continuous Infusions:   Time spent on care of this patient: 35 min   Regalado, Prentiss Bells, MD [phone] 02/15/2015, 1:18 PM  LOS: 11 days   [facility] Office  [phone] Pager - Text Page per [email or web address] If 7PM-7AM, please contact night-coverage [email or web address]

## 2015-02-15 NOTE — Progress Notes (Signed)
Melanie Camacho   DOB:April 13, 1932   ZO#:[id number]   JWJ#:[id number]  Patient Care Team: Provider Not In System as PCP - General  I have seen the patient, examined her and edited the notes as follows  Subjective: She continues to have intermittent abdominal pain, negative per abdominal x ray. No apparent new issues overnight The patient denies any recent signs or symptoms of bleeding such as spontaneous epistaxis, hematuria or hematochezia.  Scheduled Meds: . bisoprolol  5 mg Oral Daily  . cholecalciferol  5,000 Units Oral Daily  . colesevelam  1,875 mg Oral BID WC  . furosemide  40 mg Intravenous BID  . gabapentin  100 mg Oral TID  . levothyroxine  75 mcg Oral QAC breakfast  . multivitamin  1 tablet Oral Daily  . nystatin   Topical BID  . pantoprazole  40 mg Oral QHS  . rOPINIRole  0.25 mg Oral TID   Continuous Infusions:  PRN Meds:.sodium chloride, HYDROcodone-acetaminophen, ibuprofen, lactulose  Objective:  Filed Vitals:   02/15/15 0508  BP: 87/48  Pulse: 95  Temp: 97.8 F (36.6 C)  Resp: 16      Intake/Output Summary (Last 24 hours) at 02/15/15 1042 Last data filed at 02/14/15 2121  Gross per 24 hour  Intake    240 ml  Output      0 ml  Net    240 ml    GENERAL:asleep, appears comfortable SKIN: significant skin bruising, dry EYES: normal, Conjunctiva are pale and non-injected, sclera clear Musculoskeletal:no cyanosis of digits and no clubbing  NEURO: asleep, responds to verbal and tactile stimuli. ABDOMEN: soft, skin thickening from chronic anarsarca. Moderate tenderness on deep RLQ without rebound or guarding  CBG (last 3)   Recent Labs  02/13/15 2150  GLUCAP 84     Labs:   Recent Labs Lab 02/10/15 0534 02/11/15 0448 02/12/15 0615 02/13/15 0848 02/14/15 0453 02/14/15 1005 02/15/15 0557  WBC 4.1 5.0 5.5 4.5 4.1 3.5* 4.1  HGB 9.9* 10.4* 9.9* 10.4* 9.6* 9.7* 9.5*  HCT 32.0* 33.7* 31.5* 32.4* 30.4* 30.8* 30.0*  PLT 85* 60* 49* 49* 42* 47* 43*  MCV  105.3* 104.3* 102.6* 102.5* 101.0* 102.0* 102.7*  MCH 32.6 32.2 32.2 32.9 31.9 32.1 32.5  MCHC 30.9 30.9 31.4 32.1 31.6 31.5 31.7  RDW 20.4* 20.1* 20.2* 19.4* 19.6* 19.5* 19.5*  LYMPHSABS 0.6* 0.6* 0.4*  --  0.5*  --  0.6*  MONOABS 0.7 0.7 1.3*  --  1.2*  --  1.2*  EOSABS 0.3 0.3 0.4  --  0.2  --  0.3  BASOSABS 0.0 0.0 0.1  --  0.1  --  0.0     Chemistries:    Recent Labs Lab 02/09/15 0428 02/10/15 0534 02/11/15 0448 02/12/15 0902 02/13/15 0414 02/14/15 0453 02/15/15 0557  NA 140 140 141 145 139 140 138  K 4.1 4.3 3.5 4.6 3.8 3.9 3.8  CL 102 102 102 106 99* 101 98*  CO2 GLUCOSE 70 63* 70 41* 59* 67 48*  BUN 34* 34* 32* 35* 34* 34* 33*  CREATININE 1.59* 1.38* 1.35* 1.37* 1.36* 1.26* 1.32*  CALCIUM 8.7* 8.8* 9.0 9.1 8.6* 8.7* 8.8*  MG 2.2 2.1  --   --   --   --   --     GFR Estimated Creatinine Clearance: 36 mL/min (by C-G formula based on Cr of 1.32).   Coagulation profile No results for input(s): INR, PROTIME in the last  168 hours. Thyroid function studies  Recent Labs  02/14/15 0845  TSH 29.161*   Microbiology Positive for K Pneumoniae and E Coli    Imaging Studies:  Dg Abd 1 View  02/14/2015   CLINICAL DATA:  81 year old female with abdominal pain and history of multiple prior abdominal surgeries.  EXAM: ABDOMEN - 1 VIEW  COMPARISON:  Prior abdominal radiograph 02/05/2015; prior CT abdomen/ pelvis 01/06/2015  FINDINGS: A total of 3 frontal radiographs were obtained to encompass the entirety of the abdomen. No evidence of a bowel obstruction. No large free air on this supine series. Extensive surgical changes are present with multifocal regions of chain sutures and surgical clips in the right upper quadrant. Multilevel degenerative disc disease throughout the visualized thoracic and lumbar spine.  IMPRESSION: Nonobstructed bowel gas pattern.   Electronically Signed   By: Malachy Moan M.D.   On: 02/14/2015 18:18    Assessment/Plan: 81  y.o.  Progressive thrombocytopenia She may have chronic mild thrombocytopenia from significant hepatomegaly, from chronic congestion related to congestive heart failure. However, the recent drop of platelet count was likely related to recent exposure to cefepime and Linezolid. Both antibiotics were discontinued. Current count is at 43,000 Recommend holding off further anticoagulation therapy with Eliquis until we can document her platelet count is greater than 50,000 Platelet count drop has nadired, hopefully can discharge soon if rebound to >50,000 tomorrow. She does not need platelet transfusion unless her platelet count dropped to less than 20,000.  Anemia in chronic disease Her hemoglobin has stayed stable around 10 g. Current Hb is at 9.5  There is no indication to do any further workup and she would not need any blood transfusion for now. Her macrocytosis in with associated anemia secondarily related to her liver congestion and possibly recent bleeding from her wound  Leukopenia, resolved Likely due to antibiotics in the setting of E  Coli  And K. Pneumoniae UTI. Currently on Keflex She is afebrile Continue to monitor, no intervention is indicated at this time.  Atrial fibrillation on chronic anticoagulation therapy Certainly, she would benefit from chronic anticoagulation therapy to prevent stroke. Recommend holding anticoagulation therapy and to platelet rebound to greater than 50,000  Abdominal pain Abdomen X ray negative for obstruction May be related to recent UTI It appears to be improving, less severe  Discharge planning If repeat CBC showed improvement of platelet count, she could be discharged. Tentative appointment to see her back in Dr. Maxine Camacho office has been made on 02/27/2015 at 1 PM.  DNR  Other medical issues including septic shock, DM II, renal disease, atrial fibrillation as per admitting team  [facility] E, PA-C 02/15/2015  10:42 AM Melanie Kestner,  Camacho 02/15/2015

## 2015-02-15 NOTE — Care Management (Signed)
Ur updated

## 2015-02-16 LAB — CBC
HCT: 29.8 % — ABNORMAL LOW (ref 36.0–46.0)
HEMOGLOBIN: 9.3 g/dL — AB (ref 12.0–15.0)
MCH: 31.8 pg (ref 26.0–34.0)
MCHC: 31.2 g/dL (ref 30.0–36.0)
MCV: 102.1 fL — AB (ref 78.0–100.0)
PLATELETS: ADEQUATE 10*3/uL (ref 150–400)
RBC: 2.92 MIL/uL — AB (ref 3.87–5.11)
RDW: 19.5 % — ABNORMAL HIGH (ref 11.5–15.5)
WBC: 4.1 10*3/uL (ref 4.0–10.5)

## 2015-02-16 LAB — BASIC METABOLIC PANEL
Anion gap: 10 (ref 5–15)
BUN: 36 mg/dL — ABNORMAL HIGH (ref 6–20)
CHLORIDE: 100 mmol/L — AB (ref 101–111)
CO2: 28 mmol/L (ref 22–32)
Calcium: 8.7 mg/dL — ABNORMAL LOW (ref 8.9–10.3)
Creatinine, Ser: 1.52 mg/dL — ABNORMAL HIGH (ref 0.44–1.00)
GFR calc Af Amer: 36 mL/min — ABNORMAL LOW (ref >60)
GFR, EST NON AFRICAN AMERICAN: 31 mL/min — AB (ref >60)
Glucose, Bld: 64 mg/dL — ABNORMAL LOW (ref 65–99)
Potassium: 4.3 mmol/L (ref 3.5–5.1)
SODIUM: 138 mmol/L (ref 135–145)

## 2015-02-16 LAB — GLUCOSE, CAPILLARY
GLUCOSE-CAPILLARY: 84 mg/dL (ref 65–99)
GLUCOSE-CAPILLARY: 81 mg/dL (ref 65–99)
Glucose-Capillary: 51 mg/dL — ABNORMAL LOW (ref 65–99)
Glucose-Capillary: 61 mg/dL — ABNORMAL LOW (ref 65–99)
Glucose-Capillary: 81 mg/dL (ref 65–99)
Glucose-Capillary: 78 mg/dL (ref 65–99)
Glucose-Capillary: 92 mg/dL (ref 65–99)
Glucose-Capillary: 90 mg/dL (ref 65–99)

## 2015-02-16 MED ORDER — GLUCOSE 40 % PO GEL
ORAL | Status: AC
  Administered 2015-02-16: 37.5 g
  Filled 2015-02-16: qty 1

## 2015-02-16 MED ORDER — FUROSEMIDE 40 MG PO TABS
40.0000 mg | ORAL_TABLET | Freq: Every day | ORAL | Status: DC
  Administered 2015-02-16: 40 mg via ORAL
  Filled 2015-02-16: qty 1

## 2015-02-16 MED ORDER — GLUCOSE 40 % PO GEL: 1.0000 | ORAL | Status: AC | PRN

## 2015-02-16 NOTE — Progress Notes (Signed)
[facility] Progress Note   Melanie Camacho:[id number] DOB: 1932/08/18 DOA: 01/13/2015 PCP: PROVIDER NOT IN SYSTEM  Brief narrative: Melanie Camacho is a 81 y.o. female with CHF (EF 40-45%), PAF on Eliquis, CKD 3, possible small vessel CAD (clean cath 4/22), DMII who presented to [facility] as a transfer from a rehab center for acute delirium due to a UTI. Found to have septic shock and admitted to ICU. Started on pressors and stress dose steroids.  The patient was hospitalized from 4/28 through 5/24 acute on chronic diastolic heart failure as well .  After being transferred out of the ICU, she was still requiring oxygen with a CXR and exam consistent with pulm edema. I continued to diurese her with IV Lasix and she was eventually able to be weaned off of O2. At the same time her platelets were noted to be be steadily dropping which was determined to be secondary to antibiotics given earlier in the admission. The platelets have not yet shown significant improvement. She was found last night to be hypothermic.   Subjective: Feels sleepy today, but was able to have a conversation, follow commands. Feels she is sleeping better.  Will hold gabapentin Assessment/Plan:   Septic shock/UTI -Urine culture reveals Escherichia coli -Antibiotics started on 5/28- narrowed to Keflex based on sensitivities for a seven-day course completed on 6/3.  Hypothermia; improved.  - cause uncertain- will correct hypothyroidism. Might be related to hypoglycemia.  - TSH elevated but F t4 also slightly elevated- Synthroid increase  to 75 mcg -cortisol normal. Blood culture no growth to date.   Hypoglycemia:  Continue to check CBG I have order insulin level , C peptide, and sulfonylurea level,.  Frequents meals, snacks.   Thrombocytopenia - platelet were noted to be steadily dropping during the hospital stay -Discussed with hematology-review of medications reveals that she was given Zyvox and cefepime on 5/28 both  of which can cause this -Platelets still at 43 today , no evidence of active bleeding.  -  Eliquis has been on hold- per hematology, Eliquis can be resumed once platelets found to be improving and greater than 50  Acute metabolic encephalopathy on admission -Likely secondary to sepsis/ UTI and now resolved  Acute respiratory failure-acute on chronic systolic heart failure -EF 40-45% -Chest x-ray reveals bilateral effusions and right basilar infiltrate versus atelectasis -Weight when discharged was down to about 95 kg-  -change lasix to oral.  Will consider change lasix to oral on 6-9 Weight: 104--101-----96---98   AKI on  CKD (chronic kidney disease), stage III -Baseline creatinine is about 1.2--1.5 -decrease lasix dose.   Mildly elevated troponin -On 5/28 troponin was noted to be 0.04>0.05 > 0.04> 0.06 -Likely secondary to acute infection/septic shock-no further management  PAF - Holding Eliquis secondary to thrombocytopenia -Continue bisoprolol  Recent shingles -left upper back-completed treatment with valacyclovir  Hypotension -follow carefully- resumed Bisoprolol to prevent A. fib with RVR -holder parameter for bisoprolol/   Diabetes mellitus -Diet controlled-follow CBGs -low blood sugar, order snacks.   Hypothyroidism -TSH markedly elevated at 29. T 4 at 1.1. -synthroid increase to 75 mcg.  -she will need follow up TSH.   S/P AVR-(tissue) 2011 at [facility] - no CABG with this  Obesity- BMI 45   Code Status: DO NOT RESUSCITATE Family Communication: care discussed with daughter Disposition Plan: Patient refusing to go to skilled nursing-will go home with daughter with home health. Plan to discharge 6-10 DVT prophylaxis: holding eliquis due to thrombocytopenia.  Consultants:  Pulmonary critical care Procedures: Echo 5/17 Left ventricle: The cavity size was normal. Systolic function was mildly to moderately reduced. The estimated ejection fraction was in the  range of 40% to 45%. Regional wall motion abnormalities cannot be excluded. - Aortic valve: There was mild regurgitation. - Mitral valve: Calcified annulus. Mildly thickened leaflets . There was mild regurgitation. - Right ventricle: The cavity size was mildly dilated. Systolic function was mildly reduced. - Right atrium: The atrium was mildly dilated. - Pulmonary arteries: PA peak pressure: 32 mm Hg (S). - Pericardium, extracardiac: A trivial pericardial effusion was identified.   Antibiotics: Anti-infectives    Start     Dose/Rate Route Frequency Ordered Stop   02/10/15 2000  cephALEXin (KEFLEX) capsule 500 mg     500 mg Oral Every 8 hours 02/10/15 1212 02/10/15 2050   02/08/15 2000  cephALEXin (KEFLEX) capsule 500 mg  Status:  Discontinued     500 mg Oral Every 8 hours 02/08/15 1715 02/10/15 1212   02/05/15 1800  azithromycin (ZITHROMAX) 250 mg in dextrose 5 % 125 mL IVPB  Status:  Discontinued     250 mg 125 mL/hr over 60 Minutes Intravenous Every 24 hours 11-Feb-2015 2022 02/07/15 1148   02/11/15 2200  linezolid (ZYVOX) IVPB 600 mg  Status:  Discontinued     600 mg 300 mL/hr over 60 Minutes Intravenous Every 12 hours 2015-02-11 2022 02/06/15 1224   02-11-15 2200  ceFEPIme (MAXIPIME) 2 g in dextrose 5 % 50 mL IVPB  Status:  Discontinued     2 g 100 mL/hr over 30 Minutes Intravenous Every 24 hours 2015-02-11 2057 02/08/15 1715   02-11-2015 1630  linezolid (ZYVOX) IVPB 600 mg  Status:  Discontinued     600 mg 300 mL/hr over 60 Minutes Intravenous  Once Feb 11, 2015 1620 2015/02/11 2023   02-11-2015 1630  levofloxacin (LEVAQUIN) IVPB 500 mg     500 mg 100 mL/hr over 60 Minutes Intravenous  Once 02-11-15 1620 Feb 11, 2015 1810   2015-02-11 1500  cefTRIAXone (ROCEPHIN) 1 g in dextrose 5 % 50 mL IVPB     1 g 100 mL/hr over 30 Minutes Intravenous  Once February 11, 2015 1446 2015-02-11 1614      Objective: Filed Weights   02/13/15 0637 02/14/15 2200 02/15/15 2142  Weight: 96.3 kg (212 lb 4.9 oz) 98.8  kg (217 lb 13 oz) 99.4 kg (219 lb 2.2 oz)    Intake/Output Summary (Last 24 hours) at 02/16/15 1558 Last data filed at 02/16/15 1300  Gross per 24 hour  Intake   1080 ml  Output      0 ml  Net   1080 ml     Vitals Filed Vitals:   02/15/15 1754 02/15/15 2142 02/16/15 0637 02/16/15 1320  BP: 94/63 100/55 95/60 105/61  Pulse:  56 72 69  Temp:  93.9 F (34.4 C) 97 F (36.1 C) 97.8 F (36.6 C)  TempSrc:  Oral Oral Oral  Resp:  16 16 16   Height:      Weight:  99.4 kg (219 lb 2.2 oz)    SpO2:  98% 95% 95%    Exam:  General:  Pt is alert, not in acute distress  HEENT: No icterus, No thrush, oral mucosa moist  Cardiovascular: regular rate and rhythm, S1/S2 No murmur  Respiratory: crackles b/l bases R> L-  Abdomen: Soft, +Bowel sounds, non tender, non distended, no guarding + abdominal wall edema  MSK: bilateral LE edema.   Data Reviewed: Basic Metabolic Panel:  Recent Labs Lab 02/10/15 0534  02/12/15 0902 02/13/15 0414 02/14/15 0453 02/15/15 0557 02/16/15 0400  NA 140  < > 145 139 140 138 138  K 4.3  < > 4.6 3.8 3.9 3.8 4.3  CL 102  < > 106 99* 101 98* 100*  CO2 28  < > 25 29 29 28 28   GLUCOSE 63*  < > 41* 59* 67 48* 64*  BUN 34*  < > 35* 34* 34* 33* 36*  CREATININE 1.38*  < > 1.37* 1.36* 1.26* 1.32* 1.52*  CALCIUM 8.8*  < > 9.1 8.6* 8.7* 8.8* 8.7*  MG 2.1  --   --   --   --   --   --   PHOS 4.3  --   --   --   --   --   --   < > = values in this interval not displayed. Liver Function Tests: No results for input(s): AST, ALT, ALKPHOS, BILITOT, PROT, ALBUMIN in the last 168 hours. No results for input(s): LIPASE, AMYLASE in the last 168 hours. No results for input(s): AMMONIA in the last 168 hours. CBC:  Recent Labs Lab 02/10/15 0534 02/11/15 0448 02/12/15 0615 02/13/15 0848 02/14/15 0453 02/14/15 1005 02/15/15 0557 02/16/15 0400  WBC 4.1 5.0 5.5 4.5 4.1 3.5* 4.1 4.1  NEUTROABS 2.5 3.3 3.3  --  2.1  --  1.9  --   HGB 9.9* 10.4* 9.9* 10.4* 9.6*  9.7* 9.5* 9.3*  HCT 32.0* 33.7* 31.5* 32.4* 30.4* 30.8* 30.0* 29.8*  MCV 105.3* 104.3* 102.6* 102.5* 101.0* 102.0* 102.7* 102.1*  PLT 85* 60* 49* 49* 42* 47* 43* PLATELET CLUMPS NOTED ON SMEAR, COUNT APPEARS ADEQUATE   Cardiac Enzymes: No results for input(s): CKTOTAL, CKMB, CKMBINDEX, TROPONINI in the last 168 hours. BNP (last 3 results)  Recent Labs  2015/02/11 1318 02/08/15 0524 02/10/15 0534  BNP 840.0* 1285.4* 1210.7*    ProBNP (last 3 results) No results for input(s): PROBNP in the last 8760 hours.  CBG:  Recent Labs Lab 02/16/15 0711 02/16/15 0734 02/16/15 0827 02/16/15 1300 02/16/15 1448  GLUCAP 51* 61* 81 78 84    Recent Results (from the past 240 hour(s))  Culture, blood (routine x 2)     Status: None (Preliminary result)   Collection Time: 02/14/15  8:40 AM  Result Value Ref Range Status   Specimen Description BLOOD LEFT ARM  Final   Special Requests BOTTLES DRAWN AEROBIC ONLY 2CC  Final   Culture   Final           BLOOD CULTURE RECEIVED NO GROWTH TO DATE CULTURE WILL BE HELD FOR 5 DAYS BEFORE ISSUING A FINAL NEGATIVE REPORT Performed at [facility]    Report Status PENDING  Incomplete  Culture, blood (routine x 2)     Status: None (Preliminary result)   Collection Time: 02/14/15  8:45 AM  Result Value Ref Range Status   Specimen Description BLOOD RIGHT ARM  Final   Special Requests BOTTLES DRAWN AEROBIC ONLY 3CC  Final   Culture   Final           BLOOD CULTURE RECEIVED NO GROWTH TO DATE CULTURE WILL BE HELD FOR 5 DAYS BEFORE ISSUING A FINAL NEGATIVE REPORT Performed at [facility]    Report Status PENDING  Incomplete     Studies: Dg Abd 1 View  02/14/2015   CLINICAL DATA:  81 year old female with abdominal pain and history of multiple prior abdominal surgeries.  EXAM:  ABDOMEN - 1 VIEW  COMPARISON:  Prior abdominal radiograph 02/05/2015; prior CT abdomen/ pelvis 01/06/2015  FINDINGS: A total of 3 frontal radiographs were obtained to  encompass the entirety of the abdomen. No evidence of a bowel obstruction. No large free air on this supine series. Extensive surgical changes are present with multifocal regions of chain sutures and surgical clips in the right upper quadrant. Multilevel degenerative disc disease throughout the visualized thoracic and lumbar spine.  IMPRESSION: Nonobstructed bowel gas pattern.   Electronically Signed   By: Malachy Moan M.D.   On: 02/14/2015 18:18    Scheduled Meds:  Scheduled Meds: . bisoprolol  5 mg Oral Daily  . cholecalciferol  5,000 Units Oral Daily  . colesevelam  1,875 mg Oral BID WC  . furosemide  40 mg Oral Daily  . levothyroxine  75 mcg Oral QAC breakfast  . multivitamin  1 tablet Oral Daily  . nystatin   Topical BID  . pantoprazole  40 mg Oral QHS  . rOPINIRole  0.25 mg Oral TID   Continuous Infusions:   Time spent on care of this patient: 35 min   Alba Cory, MD [id number] 02/16/2015, 3:58 PM  LOS: 12 days   [facility] Office  [phone] Pager - Text Page per [email or web address] If 7PM-7AM, please contact night-coverage [email or web address]

## 2015-02-17 DIAGNOSIS — N189 Chronic kidney disease, unspecified: Secondary | ICD-10-CM | POA: Insufficient documentation

## 2015-02-17 DIAGNOSIS — T68XXXD Hypothermia, subsequent encounter: Secondary | ICD-10-CM

## 2015-02-17 DIAGNOSIS — N289 Disorder of kidney and ureter, unspecified: Secondary | ICD-10-CM

## 2015-02-17 DIAGNOSIS — I159 Secondary hypertension, unspecified: Secondary | ICD-10-CM

## 2015-02-17 DIAGNOSIS — E039 Hypothyroidism, unspecified: Secondary | ICD-10-CM

## 2015-02-17 LAB — BASIC METABOLIC PANEL
ANION GAP: 9 (ref 5–15)
BUN: 39 mg/dL — ABNORMAL HIGH (ref 6–20)
CHLORIDE: 101 mmol/L (ref 101–111)
CO2: 27 mmol/L (ref 22–32)
CREATININE: 1.85 mg/dL — AB (ref 0.44–1.00)
Calcium: 8.5 mg/dL — ABNORMAL LOW (ref 8.9–10.3)
GFR calc non Af Amer: 24 mL/min — ABNORMAL LOW (ref >60)
GFR, EST AFRICAN AMERICAN: 28 mL/min — AB (ref >60)
Glucose, Bld: 74 mg/dL (ref 65–99)
POTASSIUM: 5.1 mmol/L (ref 3.5–5.1)
Sodium: 137 mmol/L (ref 135–145)

## 2015-02-17 LAB — GLUCOSE, CAPILLARY
GLUCOSE-CAPILLARY: 13 mg/dL — AB (ref 65–99)
GLUCOSE-CAPILLARY: 75 mg/dL (ref 65–99)
GLUCOSE-CAPILLARY: 78 mg/dL (ref 65–99)
GLUCOSE-CAPILLARY: 89 mg/dL (ref 65–99)
Glucose-Capillary: 106 mg/dL — ABNORMAL HIGH (ref 65–99)
Glucose-Capillary: 71 mg/dL (ref 65–99)
Glucose-Capillary: 79 mg/dL (ref 65–99)
Glucose-Capillary: 80 mg/dL (ref 65–99)

## 2015-02-17 LAB — AMMONIA: Ammonia: 71 umol/L — ABNORMAL HIGH (ref 9–35)

## 2015-02-17 LAB — GLUCOSE, RANDOM: GLUCOSE: 83 mg/dL (ref 65–99)

## 2015-02-17 MED ORDER — ACETAMINOPHEN 325 MG PO TABS
650.0000 mg | ORAL_TABLET | Freq: Four times a day (QID) | ORAL | Status: DC | PRN
  Administered 2015-02-17 – 2015-02-19 (×4): 650 mg via ORAL
  Filled 2015-02-17 (×4): qty 2

## 2015-02-17 MED ORDER — LACTULOSE 10 GM/15ML PO SOLN
10.0000 g | Freq: Once | ORAL | Status: DC
  Filled 2015-02-17 (×3): qty 15

## 2015-02-17 MED ORDER — LACTULOSE 10 GM/15ML PO SOLN
20.0000 g | Freq: Two times a day (BID) | ORAL | Status: DC
  Administered 2015-02-17: 20 g via ORAL
  Filled 2015-02-17: qty 30

## 2015-02-17 MED ORDER — DEXTROSE 50 % IV SOLN: 25.0000 mL | Freq: Once | INTRAVENOUS | Status: DC

## 2015-02-17 MED ORDER — LACTULOSE 10 GM/15ML PO SOLN: 20.0000 g | Freq: Three times a day (TID) | ORAL | Status: DC

## 2015-02-17 NOTE — Consult Note (Signed)
CARDIOLOGY CONSULTATION NOTE.  NAME:  Melanie Camacho   MRN: [id number] DOB:  11-Aug-1932   ADMIT DATE: Mar 04, 2015  Reason for Consult: History of cardiomyopathy, acute on chronic heart failure with worsening creatinine following diuresis  Requesting Physician: Dr. Carmela Rima  Primary Cardiologist: Dr. Gala Romney  HPI: This is a 81 y.o. female with a past medical history significant for history of bioprosthetic aVR and chronic A. fib 1 ELIQUIS. She also has hypertension and diabetes along with CKD3. She has chronic diastolic heart failure, but had normal coronary arteries by catheterization in 2015. Most recent echocardiogram shows a reduction in EF to 40 and 45%.  We are consulted for worsening renal function after diuresis. She was hospitalized from April 28 to May 24 with acute on chronic diastolic heart failure in addition to other concerns. She was then readmitted on May 29 having been transferred from [facility] for altered mental status that was thought to be related to UTI related acute delirium. She is now suddenly had septic shock responded to her UTI. She likely received IV fluid hydration and has subsequently developed worsening acute on chronic heart failure symptoms (combined systolic and diastolic). She was on pressors for a short term. She has been continued on ELIQUIS for anticoagulation - but this has been stopped secondary to thrombus cytopenia. Mild troponin elevation during the initial admission. Was likely related to demand ischemia in the setting of shock. She did have an elevated BNP following that. Her UTI and are becoming speciated as Escherichia coli.  She was restarted on home dose of torsemide on May 31. She completed in a box for UTI on June 3. At that time her chest x-ray still showed bilateral effusions versus atelectasis her weight was 105 kg and her previous dry weight was 95 kg. She is currently now down to 98/99 kg. She was restarted on IV  Lasix. By June 6 her weight was down to 96 kg on IV Lasix.   After being transferred out of the ICU she still continued to require oxygen and a chest x-ray suggested pulmonary edema so she was continued on IV diuretic. Plan was to switch Lasix to oral on the ninth. Unfortunately she continued to have oxygen requirement and therefore was continued on IV Lasix. Unfortunately now her renal function has worsened with creatinine rising at 1.85.  PMHx:   Past Medical History  Diagnosis Date  . Anginal pain   . Hypertension   . Heart murmur   . CHF (congestive heart failure)   . Shortness of breath   . Complication of anesthesia     "I had a slight reaction to the morphine given in surgery"  . High cholesterol   . Coronary artery disease   . Myocardial infarction 12/2013  . Pneumonia "once or twice"  . 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 Type II diabetes mellitus   . Anemia   . History of blood transfusion     "related to the anemia"  . GERD (gastroesophageal reflux disease)   . Arthritis     "all over"  . Chronic lower back pain    Past Surgical History  Procedure Laterality Date  . Left heart catheterization with coronary angiogram N/A 12/28/2013    Procedure: LEFT HEART CATHETERIZATION WITH CORONARY ANGIOGRAM;  Surgeon: Lennette Bihari, MD;  Location: [facility] CATH LAB;  Service: Cardiovascular;  Laterality: N/A;  . Cardiac catheterization    .  Cholecystectomy open  1980's  . Appendectomy  ~ 1948  . Tonsillectomy  1954  . Hernia repair    . Abdominal hernia repair  "many times"  . Hemorrhoid banding    . Fracture surgery    . Foot fracture surgery Right ~ 1980  . Cataract extraction Right     FAMHx: Family History  Problem Relation Age of Onset  . Hypertension Mother     SOCHx:  reports that she has never smoked. She has never used smokeless tobacco. She reports that she does not drink alcohol or use illicit drugs.  ALLERGIES: Allergies  Allergen Reactions  .  Morphine And Related     Stevens-Johnsons  . Bacitracin Rash  . Betadine [Povidone Iodine] Rash  . Clindamycin/Lincomycin Rash  . Other Rash    Lincomycin  . Tape Itching and Rash    Please use "paper" tape.  . Vancomycin Rash    ROS: Although I think she heard me, she was not able to give a full review of systems and answer questions completely. It seemed like she denied any anginal symptoms but has been somewhat orthopneic and dyspneic. Review of Systems  Unable to perform ROS: medical condition  Respiratory: Positive for shortness of breath.   Cardiovascular: Positive for leg swelling. Negative for chest pain and palpitations.  Gastrointestinal: Negative for constipation, blood in stool and melena.  Genitourinary: Negative for hematuria.     HOME MEDICATIONS: Prescriptions prior to admission  Medication Sig Dispense Refill Last Dose  . acetaminophen (TYLENOL) 325 MG tablet Take 650 mg by mouth every 4 (four) hours as needed for mild pain.   01/27/2015  . apixaban (ELIQUIS) 2.5 MG TABS tablet Take 1 tablet (2.5 mg total) by mouth 2 (two) times daily.   02/02/2015 at 09:00  . Cholecalciferol (VITAMIN D3) 5000 UNITS CAPS Take 5,000 Units by mouth daily.   01/11/2015  . colesevelam (WELCHOL) 625 MG tablet Take 1,875 mg by mouth 2 (two) times daily with a meal.   02/03/2015  . docusate sodium (COLACE) 100 MG capsule Take 1 capsule (100 mg total) by mouth 2 (two) times daily. 10 capsule 0 02/06/2015  . ferrous fumarate (HEMOCYTE - 106 MG FE) 325 (106 FE) MG TABS tablet Take 1 tablet by mouth daily.   01/17/2015  . gabapentin (NEURONTIN) 100 MG capsule Take 1 capsule (100 mg total) by mouth 3 (three) times daily.   02/03/2015  . lactulose (CHRONULAC) 10 GM/15ML solution Take 15 mLs (10 g total) by mouth 2 (two) times daily.  0 never at Unknown time  . levothyroxine (SYNTHROID, LEVOTHROID) 25 MCG tablet Take 1 tablet (25 mcg total) by mouth daily before breakfast.   02/01/2015  . Multiple  Vitamins-Minerals (ICAPS) CAPS Take 2 capsules by mouth daily.    02/03/2015  . Nutritional Supplements (RA MELATONIN/B-6 PO) Take 6 mg by mouth at bedtime.   02/03/2015  . pantoprazole (PROTONIX) 40 MG tablet Take 1 tablet (40 mg total) by mouth at bedtime.   02/03/2015  . potassium chloride SA (K-DUR,KLOR-CON) 20 MEQ tablet Take 2 tablets (40 mEq total) by mouth daily. (Patient taking differently: Take 40 mEq by mouth 2 (two) times daily. ) 30 tablet 0 01/10/2015  . rOPINIRole (REQUIP) 0.5 MG tablet Take 0.5 mg by mouth at bedtime.   02/03/2015  . bisoprolol (ZEBETA) 5 MG tablet Take 1 tablet (5 mg total) by mouth daily. (Patient taking differently: Take 10 mg by mouth daily. ) 30 tablet 11 unknown  .  hydrocortisone (ANUSOL-HC) 2.5 % rectal cream Place rectally 3 (three) times daily. (Patient taking differently: Place 1 application rectally 3 (three) times daily as needed for hemorrhoids. ) 30 g 0 01/27/2015  . oxycodone (OXY-IR) 5 MG capsule Take 1 capsule (5 mg total) by mouth every 6 (six) hours as needed for pain. 10 capsule 0 01/25/2015  . torsemide (DEMADEX) 20 MG tablet Take 1 tablet (20 mg total) by mouth 2 (two) times daily.   never    HOSPITAL MEDICATIONS: Prior to Admission medications   Medication Sig Start Date End Date Taking? Authorizing Provider  acetaminophen (TYLENOL) 325 MG tablet Take 650 mg by mouth every 4 (four) hours as needed for mild pain.   Yes Historical Provider, MD  apixaban (ELIQUIS) 2.5 MG TABS tablet Take 1 tablet (2.5 mg total) by mouth 2 (two) times daily. 12/30/14  Yes Leroy Sea, MD  Cholecalciferol (VITAMIN D3) 5000 UNITS CAPS Take 5,000 Units by mouth daily.   Yes Historical Provider, MD  colesevelam (WELCHOL) 625 MG tablet Take 1,875 mg by mouth 2 (two) times daily with a meal.   Yes Historical Provider, MD  docusate sodium (COLACE) 100 MG capsule Take 1 capsule (100 mg total) by mouth 2 (two) times daily. 12/27/14  Yes Kathlen Mody, MD  ferrous fumarate  (HEMOCYTE - 106 MG FE) 325 (106 FE) MG TABS tablet Take 1 tablet by mouth daily.   Yes Historical Provider, MD  gabapentin (NEURONTIN) 100 MG capsule Take 1 capsule (100 mg total) by mouth 3 (three) times daily. 01/27/15  Yes Zannie Cove, MD  lactulose (CHRONULAC) 10 GM/15ML solution Take 15 mLs (10 g total) by mouth 2 (two) times daily. 01/27/15  Yes Zannie Cove, MD  levothyroxine (SYNTHROID, LEVOTHROID) 25 MCG tablet Take 1 tablet (25 mcg total) by mouth daily before breakfast. 12/27/14  Yes Kathlen Mody, MD  Multiple Vitamins-Minerals (ICAPS) CAPS Take 2 capsules by mouth daily.    Yes Historical Provider, MD  Nutritional Supplements (RA MELATONIN/B-6 PO) Take 6 mg by mouth at bedtime.   Yes Historical Provider, MD  pantoprazole (PROTONIX) 40 MG tablet Take 1 tablet (40 mg total) by mouth at bedtime. 12/27/14  Yes Kathlen Mody, MD  potassium chloride SA (K-DUR,KLOR-CON) 20 MEQ tablet Take 2 tablets (40 mEq total) by mouth daily. Patient taking differently: Take 40 mEq by mouth 2 (two) times daily.  12/27/14  Yes Kathlen Mody, MD  rOPINIRole (REQUIP) 0.5 MG tablet Take 0.5 mg by mouth at bedtime.   Yes Historical Provider, MD  bisoprolol (ZEBETA) 5 MG tablet Take 1 tablet (5 mg total) by mouth daily. Patient taking differently: Take 10 mg by mouth daily.  12/29/13   Rhonda G Barrett, PA-C  hydrocortisone (ANUSOL-HC) 2.5 % rectal cream Place rectally 3 (three) times daily. Patient taking differently: Place 1 application rectally 3 (three) times daily as needed for hemorrhoids.  01/27/15   Zannie Cove, MD  oxycodone (OXY-IR) 5 MG capsule Take 1 capsule (5 mg total) by mouth every 6 (six) hours as needed for pain. 12/27/14   Kathlen Mody, MD  torsemide (DEMADEX) 20 MG tablet Take 1 tablet (20 mg total) by mouth 2 (two) times daily. 01/27/15   Zannie Cove, MD    VITALS: Blood pressure 96/52, pulse 61, temperature 97.5 F (36.4 C), temperature source Oral, resp. rate 16, height 5\' 1"  (1.549 m),  weight 99.8 kg (220 lb 0.3 oz), SpO2 98 %.  PHYSICAL EXAM: General appearance: No acute distress, but somewhat lethargic  Neck: Unable to really assess JVD. Did not appear to be elevated. There was mild HJR. No bruits. Lungs: Fine bibasilar rales but no wheezes or rhonchi. Nonlabored. Heart: Irreg-Irreg, S1, S2 normal, no murmur, click, rub or gallop Abdomen: soft, non-tender; bowel sounds normal; no masses,  no organomegaly Extremities: Mild puffy edema bilateral lower extremity and some puffiness in the hands as well. Pulses: 2+ and symmetric Skin: Mild diffuse ecchymoses from venous punctures and bruising. Neurologic: Mental status: Somewhat less responsive than one would expect at baseline. She does answer questions but not in complete sentences. She is not the best of historians. Would not do so and follow commands for full examination. Cranial nerves: normal  LABS: Results for orders placed or performed during the hospital encounter of 01/20/2015 (from the past 24 hour(s))  Glucose, capillary     Status: None   Collection Time: 02/16/15  7:57 PM  Result Value Ref Range   Glucose-Capillary 92 65 - 99 mg/dL  Glucose, capillary     Status: None   Collection Time: 02/16/15  9:43 PM  Result Value Ref Range   Glucose-Capillary 90 65 - 99 mg/dL  Glucose, capillary     Status: None   Collection Time: 02/17/15 12:18 AM  Result Value Ref Range   Glucose-Capillary 89 65 - 99 mg/dL  Basic metabolic panel     Status: Abnormal   Collection Time: 02/17/15  4:57 AM  Result Value Ref Range   Sodium 137 135 - 145 mmol/L   Potassium 5.1 3.5 - 5.1 mmol/L   Chloride 101 101 - 111 mmol/L   CO2 27 22 - 32 mmol/L   Glucose, Bld 74 65 - 99 mg/dL   BUN 39 (H) 6 - 20 mg/dL   Creatinine, Ser 1.61 (H) 0.44 - 1.00 mg/dL   Calcium 8.5 (L) 8.9 - 10.3 mg/dL   GFR calc non Af Amer 24 (L) >60 mL/min   GFR calc Af Amer 28 (L) >60 mL/min   Anion gap 9 5 - 15  Glucose, capillary     Status: Abnormal    Collection Time: 02/17/15  5:27 AM  Result Value Ref Range   Glucose-Capillary 13 (LL) 65 - 99 mg/dL  Glucose, capillary     Status: None   Collection Time: 02/17/15  5:34 AM  Result Value Ref Range   Glucose-Capillary 75 65 - 99 mg/dL  Glucose, capillary     Status: None   Collection Time: 02/17/15  7:38 AM  Result Value Ref Range   Glucose-Capillary 80 65 - 99 mg/dL  Glucose, capillary     Status: None   Collection Time: 02/17/15  1:34 PM  Result Value Ref Range   Glucose-Capillary 79 65 - 99 mg/dL  Glucose, capillary     Status: None   Collection Time: 02/17/15  3:55 PM  Result Value Ref Range   Glucose-Capillary 78 65 - 99 mg/dL  Ammonia     Status: Abnormal   Collection Time: 02/17/15  6:48 PM  Result Value Ref Range   Ammonia 71 (H) 9 - 35 umol/L  Glucose, capillary     Status: None   Collection Time: 02/17/15  7:35 PM  Result Value Ref Range   Glucose-Capillary 71 65 - 99 mg/dL   Comment 1 Notify RN    Echo in April: Study Conclusions  - Left ventricle: The cavity size was normal. Wall thickness was normal. Systolic function was normal. The estimated ejection fraction was in the range of  55% to 60%. Wall motion was normal; there were no regional wall motion abnormalities. - Aortic valve: A prosthesis was present. The prosthesis had a normal range of motion. The sewing ring appeared normal, had no rocking motion, and showed no evidence of dehiscence. There was mild stenosis. There was mild regurgitation. Peak velocity (S): 240 cm/s. - Mitral valve: Moderately calcified annulus. Mildly thickened, mildly calcified leaflets . There was mild regurgitation. - Left atrium: The atrium was moderately dilated. - Right ventricle: The cavity size was mildly dilated. Wall thickness was normal. - Right atrium: The atrium was mildly dilated. - Pulmonary arteries: Systolic pressure was mildly increased. PA peak pressure: 31 mm Hg (S). - Pericardium,  extracardiac: A trivial pericardial effusion was identified posterior to the heart.  Limited Echo 5/17: Study Conclusions  - Left ventricle: The cavity size was normal. Systolic function was mildly to moderately reduced. The estimated ejection fraction was in the range of 40% to 45%. Regional wall motion abnormalities cannot be excluded. - Aortic valve: There was mild regurgitation. - Mitral valve: Calcified annulus. Mildly thickened leaflets . There was mild regurgitation. - Right ventricle: The cavity size was mildly dilated. Systolic function was mildly reduced. - Right atrium: The atrium was mildly dilated. - Pulmonary arteries: PA peak pressure: 32 mm Hg (S). - Pericardium, extracardiac: A trivial pericardial effusion was identified.  IMAGING: No recent images. No results found.  IMPRESSION: Principal Problem:   Septic shock Active Problems:   Chronic a-fib   S/P AVR-(tissue) 2011 at [facility] - no CABG with this   HTN- (transient hypotension during recent admission)   Obesity- BMI 45   Acute on chronic diastolic heart failure   DM (diabetes mellitus)   CKD (chronic kidney disease), stage III   Altered mental status   Urinary tract infectious disease   Thrombocytopenia   Hypoglycemia   Hypothermia   RECOMMENDATION:  Is a very difficult situation to come into the patient has been in hospital for a while. It would appear that she has been diuresing quite well and is close to her previous dry weight but now despite not quite getting to dry weight has had worsening of renal function with a rising creatinine and BUN.  She has not had chest x-ray couple days, will check a chest x-ray to evaluate if there is any evidence of pulmonary edema residually. Does not sound very dramatic on exam, in fact sounds relatively clear just some mild basal crackles. She may be Echo poised this point where we need to simply hold off on Lasix for now to allow for equilibration from  the third space into the vasculature.  For now would continue to hold diuretic, and monitor for hypotension to void low output mediated renal dysfunction.  She is not currently on ELIQUIS related to her thrombocytopenia. Once this is stable would need to consider whether or not we restart ELIQUIS for A. fib related stroke prevention.  For now would hold off on any further diuresis until renal function is improved and then would simply restart oral home dose.  Unfortunately cannot get much from her and talking to her simply because she is quite lethargic today. But she does not seem to be as acutely ill as well. Prior to her transfer.   We'll follow-up a chest x-ray to make sure there is no signs of worsening deformity edema as she has had increased oxygen requirement.  For now I agree with holding her Lasix. She is not able to tolerate  any of her other cardiac medications besides the beta blocker which I would continue to avoid rebound tachycardia and rebound rapid A. fib. Anti-correlation is on hold as noted above. We'll reassess tomorrow after having held her diuretic for least 1/2-2 days by that point. Once her renal function stabilizes, will simply restart oral Lasix. Hopefully by then her intravascular volume will every couple of bradycardia and and renal function will improve. At that point I consider further diuresis if she continues to have diastolic failure symptoms versus continue with home regimen.     Time Spent Directly with Patient: 45 minutes; 30+ min with chart  HARDING, Piedad Climes, M.D., M.S. Interventional Cardiologist   Pager # [phone]

## 2015-02-17 NOTE — Consult Note (Signed)
Reason for Consult: Hypothyroidism and hypoglycemia  Referring Physician: [name] is an 81 y.o. female.  HPI:   Apparently the patient had been diagnosed to have hypothyroidism about 6 months ago.  History obtained from the daughter. Details are not available and most likely she was diagnosed incidentally on lab work and was started on low-dose levothyroxine 25 g She has had sepsis and has not had consistent oral intake and not clear if she has been able to get her levothyroxine orally since her admission for sepsis The patient has been lethargic and also hypothermic, however has had multiple other systemic problems including sepsis, congestive heart failure and possible hepatic encephalopathy recently TSH appears to be increasing over the last month   Lab Results  Component Value Date   TSH 29.161* 02/14/2015   TSH 17.851* 01/13/2015   TSH 15.717* 01/11/2015   FREET4 1.15* 02/14/2015   FREET4 0.75* 12/16/2014   FREET4 1.03 12/29/2013    HYPOGLYCEMIA:  This has been present since about 02/12/15 Appears that the patient previously had diagnosis of diabetes but her outpatient medication list does not indicate she was on any oral diabetic agents except WelChol She was given stress doses of steroids on admission for sepsis and her blood sugars appeared to be normal during that time Her blood sugar was low starting on 6/5 in the morning when it was 41 and subsequently has been intermittently low at times Blood sugars however have been in the low normal range without needing any intravenous dextrose since about 6/7  CBG (last 3)   Recent Labs  02/17/15 0738 02/17/15 1334 02/17/15 1555  GLUCAP 80 79 78     Past Medical History  Diagnosis Date  . Anginal pain   . Hypertension   . Heart murmur   . CHF (congestive heart failure)   . Shortness of breath   . Complication of anesthesia     "I had a slight reaction to the morphine given in surgery"  . High  cholesterol   . Coronary artery disease   . Myocardial infarction 12/2013  . Pneumonia "once or twice"  . 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 Type II diabetes mellitus   . Anemia   . History of blood transfusion     "related to the anemia"  . GERD (gastroesophageal reflux disease)   . Arthritis     "all over"  . Chronic lower back pain     Past Surgical History  Procedure Laterality Date  . Left heart catheterization with coronary angiogram N/A 12/28/2013    Procedure: LEFT HEART CATHETERIZATION WITH CORONARY ANGIOGRAM;  Surgeon: Troy Sine, MD;  Location: [facility] CATH LAB;  Service: Cardiovascular;  Laterality: N/A;  . Cardiac catheterization    . Cholecystectomy open  1980's  . Appendectomy  ~ 1948  . Tonsillectomy  1954  . Hernia repair    . Abdominal hernia repair  "many times"  . Hemorrhoid banding    . Fracture surgery    . Foot fracture surgery Right ~ 1980  . Cataract extraction Right     Family History  Problem Relation Age of Onset  . Hypertension Mother     Social History:  reports that she has never smoked. She has never used smokeless tobacco. She reports that she does not drink alcohol or use illicit drugs.  Allergies:  Allergies  Allergen Reactions  . Morphine And Related  Stevens-Johnsons  . Bacitracin Rash  . Betadine [Povidone Iodine] Rash  . Clindamycin/Lincomycin Rash  . Other Rash    Lincomycin  . Tape Itching and Rash    Please use "paper" tape.  . Vancomycin Rash    Medications:  Scheduled: . bisoprolol  5 mg Oral Daily  . cholecalciferol  5,000 Units Oral Daily  . colesevelam  1,875 mg Oral BID WC  . dextrose  25 mL Intravenous Once  . lactulose  10 g Oral Once  . lactulose  30 g Oral TID  . levothyroxine  75 mcg Oral QAC breakfast  . multivitamin  1 tablet Oral Daily  . nystatin   Topical BID  . pantoprazole  40 mg Oral QHS  . rOPINIRole  0.25 mg Oral TID    Results for orders placed or performed during the  hospital encounter of 01/24/2015 (from the past 48 hour(s))  Glucose, capillary     Status: None   Collection Time: 02/15/15 11:49 PM  Result Value Ref Range   Glucose-Capillary 77 65 - 99 mg/dL  CBC     Status: Abnormal   Collection Time: 02/16/15  4:00 AM  Result Value Ref Range   WBC 4.1 4.0 - 10.5 K/uL    Comment: REPEATED TO VERIFY WHITE COUNT CONFIRMED ON SMEAR    RBC 2.92 (L) 3.87 - 5.11 MIL/uL   Hemoglobin 9.3 (L) 12.0 - 15.0 g/dL   HCT 29.8 (L) 36.0 - 46.0 %   MCV 102.1 (H) 78.0 - 100.0 fL   MCH 31.8 26.0 - 34.0 pg   MCHC 31.2 30.0 - 36.0 g/dL   RDW 19.5 (H) 11.5 - 15.5 %   Platelets  150 - 400 K/uL    PLATELET CLUMPS NOTED ON SMEAR, COUNT APPEARS ADEQUATE  Basic metabolic panel     Status: Abnormal   Collection Time: 02/16/15  4:00 AM  Result Value Ref Range   Sodium 138 135 - 145 mmol/L   Potassium 4.3 3.5 - 5.1 mmol/L   Chloride 100 (L) 101 - 111 mmol/L   CO2 28 22 - 32 mmol/L   Glucose, Bld 64 (L) 65 - 99 mg/dL   BUN 36 (H) 6 - 20 mg/dL   Creatinine, Ser 1.52 (H) 0.44 - 1.00 mg/dL   Calcium 8.7 (L) 8.9 - 10.3 mg/dL   GFR calc non Af Amer 31 (L) >60 mL/min   GFR calc Af Amer 36 (L) >60 mL/min    Comment: (NOTE) The eGFR has been calculated using the CKD EPI equation. This calculation has not been validated in all clinical situations. eGFR's persistently <60 mL/min signify possible Chronic Kidney Disease.    Anion gap 10 5 - 15  Glucose, capillary     Status: Abnormal   Collection Time: 02/16/15  7:11 AM  Result Value Ref Range   Glucose-Capillary 51 (L) 65 - 99 mg/dL  Glucose, capillary     Status: Abnormal   Collection Time: 02/16/15  7:34 AM  Result Value Ref Range   Glucose-Capillary 61 (L) 65 - 99 mg/dL  Glucose, capillary     Status: None   Collection Time: 02/16/15  8:27 AM  Result Value Ref Range   Glucose-Capillary 81 65 - 99 mg/dL  Glucose, capillary     Status: None   Collection Time: 02/16/15  1:00 PM  Result Value Ref Range    Glucose-Capillary 78 65 - 99 mg/dL  Glucose, capillary     Status: None   Collection Time:  02/16/15  2:48 PM  Result Value Ref Range   Glucose-Capillary 84 65 - 99 mg/dL  Glucose, capillary     Status: None   Collection Time: 02/16/15  4:09 PM  Result Value Ref Range   Glucose-Capillary 81 65 - 99 mg/dL  Glucose, capillary     Status: None   Collection Time: 02/16/15  7:57 PM  Result Value Ref Range   Glucose-Capillary 92 65 - 99 mg/dL  Glucose, capillary     Status: None   Collection Time: 02/16/15  9:43 PM  Result Value Ref Range   Glucose-Capillary 90 65 - 99 mg/dL  Glucose, capillary     Status: None   Collection Time: 02/17/15 12:18 AM  Result Value Ref Range   Glucose-Capillary 89 65 - 99 mg/dL  Basic metabolic panel     Status: Abnormal   Collection Time: 02/17/15  4:57 AM  Result Value Ref Range   Sodium 137 135 - 145 mmol/L   Potassium 5.1 3.5 - 5.1 mmol/L   Chloride 101 101 - 111 mmol/L   CO2 27 22 - 32 mmol/L   Glucose, Bld 74 65 - 99 mg/dL   BUN 39 (H) 6 - 20 mg/dL   Creatinine, Ser 1.85 (H) 0.44 - 1.00 mg/dL   Calcium 8.5 (L) 8.9 - 10.3 mg/dL   GFR calc non Af Amer 24 (L) >60 mL/min   GFR calc Af Amer 28 (L) >60 mL/min    Comment: (NOTE) The eGFR has been calculated using the CKD EPI equation. This calculation has not been validated in all clinical situations. eGFR's persistently <60 mL/min signify possible Chronic Kidney Disease.    Anion gap 9 5 - 15  Glucose, capillary     Status: Abnormal   Collection Time: 02/17/15  5:27 AM  Result Value Ref Range   Glucose-Capillary 13 (LL) 65 - 99 mg/dL  Glucose, capillary     Status: None   Collection Time: 02/17/15  5:34 AM  Result Value Ref Range   Glucose-Capillary 75 65 - 99 mg/dL  Glucose, capillary     Status: None   Collection Time: 02/17/15  7:38 AM  Result Value Ref Range   Glucose-Capillary 80 65 - 99 mg/dL  Glucose, capillary     Status: None   Collection Time: 02/17/15  1:34 PM  Result Value Ref  Range   Glucose-Capillary 79 65 - 99 mg/dL  Glucose, capillary     Status: None   Collection Time: 02/17/15  3:55 PM  Result Value Ref Range   Glucose-Capillary 78 65 - 99 mg/dL    No results found.  Review of Systems  Respiratory: Positive for shortness of breath.   Cardiovascular: Negative for chest pain.  Gastrointestinal: Negative for abdominal pain.  Neurological: Positive for sensory change and weakness.   Blood pressure 96/52, pulse 61, temperature 97.5 F (36.4 C), temperature source Oral, resp. rate 16, height _0  (1.549 m), weight 220 lb 0.3 oz (99.8 kg), SpO2 98 %. Physical Exam  Constitutional: She appears well-developed. She appears lethargic.  Eyes: Pupils are equal, round, and reactive to light.  Cardiovascular: Regular rhythm and normal heart sounds.   Respiratory: Breath sounds normal. She has no wheezes.  Mild tachypnea   GI: She exhibits no distension and no mass. There is no tenderness. There is no guarding.  Abdominal obesity present.  Has pitting edema on the dependent part of the abdominal wall  Musculoskeletal: She exhibits edema.  Neurological: She appears lethargic.  She displays tremor. She displays normal reflexes.  She is relatively obtunded and not respond to verbal stimulation.  she has a coarse jerky tremor on the left arm and mild flapping tremor of the hand also but patient is unable to cooperate for testing Deep tendon reflexes at the biceps do not show delayed relaxation   Skin: Skin is warm and dry.    Assessment/Plan:  Hypoglycemia: This is likely to be from multisystem failure over the last 2-3 weeks including congestive heart failure, renal insufficiency, untreated hypothyroidism and probable hepatic encephalopathy.  Also appears that patient was given stress doses of steroids on admission and low sugars may be related to withdrawal of steroids Since her blood sugars are stable in the last 24 hours she is likely to have an underlying  cause such as an abdominal tumor or insulinoma C-peptide has not been checked at the time of hypoglycemia and will order the tests to be done if the blood sugar is below 60  HYPOTHYROIDISM: Although her symptoms are hard to evaluate because of her multiple medical problems causing lethargy she has had hypothermia of unclear etiology.  She is recovering from sepsis and has had inadequate nutrition and persistent mild renal dysfunction Her exam does not support the diagnosis of myxedema Her dose of levothyroxine has been increased recently However concerned that since she appears to have anasarca she may have bowel edema inhibiting the absorption of oral medications Will temporarily switch her levothyroxine to IV until her general condition improves and she has significant improvement in her generalized edema  Please call if there is any other questions  Jerimah Witucki 02/17/2015, 6:44 PM

## 2015-02-17 NOTE — Progress Notes (Signed)
[facility] Progress Note   [name] WJX:[id number] DOB: October 30, 1931 DOA: February 16, 2015 PCP: PROVIDER NOT IN SYSTEM  Brief narrative: Melanie Camacho is a 81 y.o. female with CHF (EF 40-45%), PAF on Eliquis, CKD 3, possible small vessel CAD (clean cath 4/22), DMII who presented to [facility] a transfer from a rehab [facility] for acute delirium due to a UTI. Found to have septic shock and admitted to ICU. Started on pressors and stress dose steroids.  The patient was hospitalized from 4/28 through 5/24 acute on chronic diastolic heart failure as well .  After being transferred out of the ICU, she was still requiring oxygen with a CXR and exam consistent with pulm edema. I continued to diurese her with IV Lasix and she was eventually able to be weaned off of O2. At the same time her platelets were noted to be be steadily dropping which was determined to be secondary to antibiotics given earlier in the admission. The platelets have not yet shown significant improvement. She was found last night to be hypothermic.   Subjective: Patient is alert in no acute distress. She is more alert today. Feels less sleepy. She has decrease appetite.  Denies dyspnea.    Assessment/Plan:   Septic shock/UTI -Urine culture reveals Escherichia coli -Antibiotics started on 5/28- narrowed to Keflex based on sensitivities for a seven-day course completed on 6/3.  Hypothermia;  - cause uncertain- will correct hypothyroidism. Might be related to hypoglycemia.  - TSH elevated but F t4 also slightly elevated- Synthroid increase  to 75 mcg -cortisol normal. Blood culture no growth to date.  -develops hypothermia again, on bear huger. Dr Lucianne Muss endocrinologist will se patient in consultation.  -will check Urine culture. .   Hypoglycemia:  Continue to check CBG insulin level , C peptide, and sulfonylurea level pending,.  Frequents meals, snacks.   Thrombocytopenia - platelet were noted to be steadily dropping during  the hospital stay -Discussed with hematology-review of medications reveals that she was given Zyvox and cefepime on 5/28 both of which can cause this -Platelets still at 43 today , no evidence of active bleeding.  -  Eliquis has been on hold- per hematology, Eliquis can be resumed once platelets found to be improving and greater than 50  Acute metabolic encephalopathy on admission -Likely secondary to sepsis/ UTI and now resolved  Acute respiratory failure-acute on chronic systolic heart failure -EF 40-45% -Chest x-ray reveals bilateral effusions and right basilar infiltrate versus atelectasis -Weight when discharged was down to about 95 kg-  -Will consider change lasix to oral on 6-9 -Weight: 104--101-----96---98--99 -will hold lasix for now. Will consult cardiology, weight increasing, patient still hypoxic, challenge due to renal failure.    AKI on  CKD (chronic kidney disease), stage III -Baseline creatinine is about 1.2--1.5 -decrease lasix dose yesterday. Cr increase. Will hold lasix.   Mildly elevated troponin -On 5/28 troponin was noted to be 0.04>0.05 > 0.04> 0.06 -Likely secondary to acute infection/septic shock-no further management  PAF - Holding Eliquis secondary to thrombocytopenia -Continue bisoprolol  Recent shingles -left upper back-completed treatment with valacyclovir  Hypotension -resumed Bisoprolol to prevent A. fib with RVR -holder parameter for bisoprolol/   Diabetes mellitus -Diet controlled-follow CBGs -low blood sugar, order snacks.   Hypothyroidism -TSH markedly elevated at 29. T 4 at 1.1. -synthroid increase to 75 mcg.  -she will need follow up TSH.   S/P AVR-(tissue) 2011 at [facility] - no CABG with this  Obesity- BMI 45  Code Status: DO NOT RESUSCITATE Family Communication: care discussed with daughter Disposition Plan: Patient refusing to go to skilled nursing-will go home with daughter with home health. Plan to discharge 6-10 DVT  prophylaxis: holding eliquis due to thrombocytopenia.  Consultants: Pulmonary critical care Procedures: Echo 5/17 Left ventricle: The cavity size was normal. Systolic function was mildly to moderately reduced. The estimated ejection fraction was in the range of 40% to 45%. Regional wall motion abnormalities cannot be excluded. - Aortic valve: There was mild regurgitation. - Mitral valve: Calcified annulus. Mildly thickened leaflets . There was mild regurgitation. - Right ventricle: The cavity size was mildly dilated. Systolic function was mildly reduced. - Right atrium: The atrium was mildly dilated. - Pulmonary arteries: PA peak pressure: 32 mm Hg (S). - Pericardium, extracardiac: A trivial pericardial effusion was identified.   Antibiotics: Anti-infectives    Start     Dose/Rate Route Frequency Ordered Stop   02/10/15 2000  cephALEXin (KEFLEX) capsule 500 mg     500 mg Oral Every 8 hours 02/10/15 1212 02/10/15 2050   02/08/15 2000  cephALEXin (KEFLEX) capsule 500 mg  Status:  Discontinued     500 mg Oral Every 8 hours 02/08/15 1715 02/10/15 1212   02/05/15 1800  azithromycin (ZITHROMAX) 250 mg in dextrose 5 % 125 mL IVPB  Status:  Discontinued     250 mg 125 mL/hr over 60 Minutes Intravenous Every 24 hours 01/31/2015 2022 02/07/15 1148   01/20/2015 2200  linezolid (ZYVOX) IVPB 600 mg  Status:  Discontinued     600 mg 300 mL/hr over 60 Minutes Intravenous Every 12 hours 01/26/2015 2022 02/06/15 1224   01/20/2015 2200  ceFEPIme (MAXIPIME) 2 g in dextrose 5 % 50 mL IVPB  Status:  Discontinued     2 g 100 mL/hr over 30 Minutes Intravenous Every 24 hours 02/07/2015 2057 02/08/15 1715   01/31/2015 1630  linezolid (ZYVOX) IVPB 600 mg  Status:  Discontinued     600 mg 300 mL/hr over 60 Minutes Intravenous  Once 01/31/2015 1620 01/26/2015 2023   02/06/2015 1630  levofloxacin (LEVAQUIN) IVPB 500 mg     500 mg 100 mL/hr over 60 Minutes Intravenous  Once 01/16/2015 1620 01/30/2015 1810    01/19/2015 1500  cefTRIAXone (ROCEPHIN) 1 g in dextrose 5 % 50 mL IVPB     1 g 100 mL/hr over 30 Minutes Intravenous  Once 01/23/2015 1446 01/17/2015 1614      Objective: Filed Weights   02/15/15 2142 02/16/15 2144 02/17/15 0540  Weight: 99.4 kg (219 lb 2.2 oz) 98 kg (216 lb 0.8 oz) 99.8 kg (220 lb 0.3 oz)    Intake/Output Summary (Last 24 hours) at 02/17/15 0853 Last data filed at 02/17/15 0105  Gross per 24 hour  Intake   1490 ml  Output      0 ml  Net   1490 ml     Vitals Filed Vitals:   02/16/15 1320 02/16/15 2144 02/16/15 2252 02/17/15 0540  BP: 105/61 105/56  99/46  Pulse: 69 43  65  Temp: 97.8 F (36.6 C) 93.7 F (34.3 C) 94.4 F (34.7 C) 94.7 F (34.8 C)  TempSrc: Oral Oral Oral Oral  Resp: 16 16  16   Height:      Weight:  98 kg (216 lb 0.8 oz)  99.8 kg (220 lb 0.3 oz)  SpO2: 95% 100%  100%    Exam:  General:  Pt is alert, not in acute distress  HEENT: No icterus,  No thrush, oral mucosa moist  Cardiovascular: regular rate and rhythm, S1/S2 No murmur  Respiratory: crackles b/l bases R> L-  Abdomen: Soft, +Bowel sounds, non tender, non distended, no guarding + abdominal wall edema  MSK: bilateral LE edema.   Data Reviewed: Basic Metabolic Panel:  Recent Labs Lab 02/13/15 0414 02/14/15 0453 02/15/15 0557 02/16/15 0400 02/17/15 0457  NA 139 140 138 138 137  K 3.8 3.9 3.8 4.3 5.1  CL 99* 101 98* 100* 101  CO2 29 29 28 28 27   GLUCOSE 59* 67 48* 64* 74  BUN 34* 34* 33* 36* 39*  CREATININE 1.36* 1.26* 1.32* 1.52* 1.85*  CALCIUM 8.6* 8.7* 8.8* 8.7* 8.5*   Liver Function Tests: No results for input(s): AST, ALT, ALKPHOS, BILITOT, PROT, ALBUMIN in the last 168 hours. No results for input(s): LIPASE, AMYLASE in the last 168 hours. No results for input(s): AMMONIA in the last 168 hours. CBC:  Recent Labs Lab 02/11/15 0448 02/12/15 0615 02/13/15 0848 02/14/15 0453 02/14/15 1005 02/15/15 0557 02/16/15 0400  WBC 5.0 5.5 4.5 4.1 3.5* 4.1 4.1   NEUTROABS 3.3 3.3  --  2.1  --  1.9  --   HGB 10.4* 9.9* 10.4* 9.6* 9.7* 9.5* 9.3*  HCT 33.7* 31.5* 32.4* 30.4* 30.8* 30.0* 29.8*  MCV 104.3* 102.6* 102.5* 101.0* 102.0* 102.7* 102.1*  PLT 60* 49* 49* 42* 47* 43* PLATELET CLUMPS NOTED ON SMEAR, COUNT APPEARS ADEQUATE   Cardiac Enzymes: No results for input(s): CKTOTAL, CKMB, CKMBINDEX, TROPONINI in the last 168 hours. BNP (last 3 results)  Recent Labs  2015-02-10 1318 02/08/15 0524 02/10/15 0534  BNP 840.0* 1285.4* 1210.7*    ProBNP (last 3 results) No results for input(s): PROBNP in the last 8760 hours.  CBG:  Recent Labs Lab 02/16/15 2143 02/17/15 0018 02/17/15 0527 02/17/15 0534 02/17/15 0738  GLUCAP 90 89 13* 75 80    Recent Results (from the past 240 hour(s))  Culture, blood (routine x 2)     Status: None (Preliminary result)   Collection Time: 02/14/15  8:40 AM  Result Value Ref Range Status   Specimen Description BLOOD LEFT ARM  Final   Special Requests BOTTLES DRAWN AEROBIC ONLY 2CC  Final   Culture   Final           BLOOD CULTURE RECEIVED NO GROWTH TO DATE CULTURE WILL BE HELD FOR 5 DAYS BEFORE ISSUING A FINAL NEGATIVE REPORT Performed at [facility]    Report Status PENDING  Incomplete  Culture, blood (routine x 2)     Status: None (Preliminary result)   Collection Time: 02/14/15  8:45 AM  Result Value Ref Range Status   Specimen Description BLOOD RIGHT ARM  Final   Special Requests BOTTLES DRAWN AEROBIC ONLY 3CC  Final   Culture   Final           BLOOD CULTURE RECEIVED NO GROWTH TO DATE CULTURE WILL BE HELD FOR 5 DAYS BEFORE ISSUING A FINAL NEGATIVE REPORT Performed at [facility]    Report Status PENDING  Incomplete     Studies: No results found.  Scheduled Meds:  Scheduled Meds: . bisoprolol  5 mg Oral Daily  . cholecalciferol  5,000 Units Oral Daily  . colesevelam  1,875 mg Oral BID WC  . dextrose  25 mL Intravenous Once  . levothyroxine  75 mcg Oral QAC breakfast  .  multivitamin  1 tablet Oral Daily  . nystatin   Topical BID  . pantoprazole  40 mg Oral QHS  . rOPINIRole  0.25 mg Oral TID   Continuous Infusions:   Time spent on care of this patient: 35 min   Manfred Laspina, Prentiss Bells, MD [phone] 02/17/2015, 8:53 AM  LOS: 13 days   [facility] Office  [phone] Pager - Text Page per [email or web address] If 7PM-7AM, please contact night-coverage [email or web address]

## 2015-02-17 NOTE — Progress Notes (Signed)
Patient's temp very low at 93.7.  We put warmed blankets on her.  It went up to 94.6. Connected her up to the warming blanket. Continue to monitor.

## 2015-02-18 ENCOUNTER — Inpatient Hospital Stay (HOSPITAL_COMMUNITY): Payer: Medicare Other

## 2015-02-18 DIAGNOSIS — E039 Hypothyroidism, unspecified: Secondary | ICD-10-CM | POA: Insufficient documentation

## 2015-02-18 LAB — GLUCOSE, CAPILLARY
GLUCOSE-CAPILLARY: 71 mg/dL (ref 65–99)
Glucose-Capillary: 67 mg/dL (ref 65–99)
Glucose-Capillary: 84 mg/dL (ref 65–99)
Glucose-Capillary: 109 mg/dL — ABNORMAL HIGH (ref 65–99)
Glucose-Capillary: 105 mg/dL — ABNORMAL HIGH (ref 65–99)
Glucose-Capillary: 102 mg/dL — ABNORMAL HIGH (ref 65–99)

## 2015-02-18 LAB — BASIC METABOLIC PANEL
ANION GAP: 10 (ref 5–15)
BUN: 40 mg/dL — ABNORMAL HIGH (ref 6–20)
CALCIUM: 8.8 mg/dL — AB (ref 8.9–10.3)
CHLORIDE: 99 mmol/L — AB (ref 101–111)
CO2: 29 mmol/L (ref 22–32)
Creatinine, Ser: 2.04 mg/dL — ABNORMAL HIGH (ref 0.44–1.00)
GFR calc Af Amer: 25 mL/min — ABNORMAL LOW (ref >60)
GFR, EST NON AFRICAN AMERICAN: 22 mL/min — AB (ref >60)
Glucose, Bld: 84 mg/dL (ref 65–99)
POTASSIUM: 4.9 mmol/L (ref 3.5–5.1)
SODIUM: 138 mmol/L (ref 135–145)

## 2015-02-18 LAB — C-PEPTIDE
C-Peptide: 4 ng/mL (ref 1.1–4.4)
C-Peptide: 1.9 ng/mL (ref 1.1–4.4)

## 2015-02-18 LAB — CBC
HEMATOCRIT: 28.9 % — AB (ref 36.0–46.0)
Hemoglobin: 9 g/dL — ABNORMAL LOW (ref 12.0–15.0)
MCH: 32.4 pg (ref 26.0–34.0)
MCHC: 31.1 g/dL (ref 30.0–36.0)
MCV: 104 fL — ABNORMAL HIGH (ref 78.0–100.0)
PLATELETS: 41 10*3/uL — AB (ref 150–400)
RBC: 2.78 MIL/uL — ABNORMAL LOW (ref 3.87–5.11)
RDW: 19.2 % — ABNORMAL HIGH (ref 11.5–15.5)
WBC: 4.2 10*3/uL (ref 4.0–10.5)

## 2015-02-18 LAB — URINE CULTURE: Colony Count: 75000

## 2015-02-18 LAB — INSULIN, RANDOM: INSULIN: 12.6 u[IU]/mL (ref 2.6–24.9)

## 2015-02-18 LAB — AMMONIA: AMMONIA: 60 umol/L — AB (ref 9–35)

## 2015-02-18 MED ORDER — LEVOTHYROXINE SODIUM 100 MCG IV SOLR
50.0000 ug | Freq: Every day | INTRAVENOUS | Status: DC
  Administered 2015-02-19 – 2015-02-20 (×2): 50 ug via INTRAVENOUS
  Filled 2015-02-18 (×3): qty 5

## 2015-02-18 MED ORDER — HYDROCODONE-ACETAMINOPHEN 5-325 MG PO TABS
1.0000 | ORAL_TABLET | ORAL | Status: DC | PRN
  Administered 2015-02-18: 1 via ORAL
  Administered 2015-02-19: 2 via ORAL
  Filled 2015-02-18: qty 1
  Filled 2015-02-18: qty 2

## 2015-02-18 MED ORDER — SIMETHICONE 40 MG/0.6ML PO SUSP
40.0000 mg | Freq: Four times a day (QID) | ORAL | Status: DC | PRN
  Administered 2015-02-18: 40 mg via ORAL
  Filled 2015-02-18 (×2): qty 0.6

## 2015-02-18 MED ORDER — LACTULOSE 10 GM/15ML PO SOLN
30.0000 g | Freq: Three times a day (TID) | ORAL | Status: DC
  Administered 2015-02-18: 30 g via ORAL
  Filled 2015-02-18: qty 45

## 2015-02-18 MED ORDER — GABAPENTIN 100 MG PO CAPS
100.0000 mg | ORAL_CAPSULE | Freq: Every day | ORAL | Status: DC
  Administered 2015-02-18: 100 mg via ORAL
  Filled 2015-02-18: qty 1

## 2015-02-18 MED ORDER — LACTULOSE 10 GM/15ML PO SOLN
20.0000 g | Freq: Three times a day (TID) | ORAL | Status: DC
  Administered 2015-02-18: 20 g via ORAL
  Filled 2015-02-18 (×2): qty 30

## 2015-02-18 NOTE — Progress Notes (Signed)
[facility] Progress Note   Melanie Camacho AVW:[id number] DOB: August 11, 1932 DOA: 02/03/2015 PCP: PROVIDER NOT IN SYSTEM  Brief narrative: Melanie Camacho is a 81 y.o. female with CHF (EF 40-45%), PAF on Eliquis, CKD 3, possible small vessel CAD (clean cath 4/22), DMII who presented to [facility] as a transfer from a rehab center for acute delirium due to a UTI. Found to have septic shock and admitted to ICU. Started on pressors and stress dose steroids.  The patient was hospitalized from 4/28 through 5/24 acute on chronic diastolic heart failure as well .  After being transferred out of the ICU, she was still requiring oxygen with a CXR and exam consistent with pulm edema. I continued to diurese her with IV Lasix and she was eventually able to be weaned off of O2. At the same time her platelets were noted to be be steadily dropping which was determined to be secondary to antibiotics given earlier in the admission. The platelets have not yet shown significant improvement. She was found last night to be hypothermic.   Subjective: Patient is more alert today. She is complaining of cramping abdominal pain.    Assessment/Plan:   Septic shock/UTI -Urine culture reveals Escherichia coli -Antibiotics started on 5/28- narrowed to Keflex based on sensitivities for a seven-day course completed on 6/3.  Hypothermia;  - cause uncertain- will correct hypothyroidism. Might be related to hypoglycemia.  - TSH elevated but F t4 also slightly elevated- Synthroid increase  to 75 mcg -cortisol normal. Blood culture no growth to date.  - Dr Lucianne Muss endocrinologist will se patient in consultation.  -Follow repeated Urine culture. .  Synthroid change to IV.   Hypoglycemia:  Continue to check CBG insulin level , C peptide, and sulfonylurea level pending,.  Frequents meals, snacks.   Encephalopathy; ammonia elevated.  Lactulose now schedule.  She is more alert today.   Abdominal pain:  Complaining of cramping  abdominal pain.  Will try simethicone. Suspect is from lactulose.  Cramping abdominal pain is not on the site of abdominal hernia.   Thrombocytopenia - platelet were noted to be steadily dropping during the hospital stay -Discussed with hematology-review of medications reveals that she was given Zyvox and cefepime on 5/28 both of which can cause this -Platelets still at 43 today , no evidence of active bleeding.  -  Eliquis has been on hold- per hematology, Eliquis can be resumed once platelets found to be improving and greater than 50  Acute metabolic encephalopathy on admission -Likely secondary to sepsis/ UTI and now resolved  Acute respiratory failure-acute on chronic systolic heart failure -EF 40-45% -Chest x-ray reveals bilateral effusions and right basilar infiltrate versus atelectasis -Weight when discharged was down to about 95 kg-  -Will consider change lasix to oral on 6-9 -Weight: 104--101-----96---98--99 -Appreciate Cardiology help.  -will order chest x ray as recommended by cardio.    AKI on  CKD (chronic kidney disease), stage III -Baseline creatinine is about 1.2--1.5 -Cr at 2.0. Continue to hold lasix.   Mildly elevated troponin -On 5/28 troponin was noted to be 0.04>0.05 > 0.04> 0.06 -Likely secondary to acute infection/septic shock-no further management  PAF - Holding Eliquis secondary to thrombocytopenia -Continue bisoprolol  Recent shingles -left upper back-completed treatment with valacyclovir  Hypotension -resumed Bisoprolol to prevent A. fib with RVR -holder parameter for bisoprolol/   Diabetes mellitus -Diet controlled-follow CBGs -insulin level, c peptide pending.  Multifactorial. Appreciate Dr Lucianne Muss evaluation.   Hypothyroidism -TSH markedly elevated at 29.  T 4 at 1.1. -synthroid change to IV -Appreciate Dr Lucianne Muss help.   S/P AVR-(tissue) 2011 at [facility] - no CABG with this  Obesity- BMI 45   Code Status: DO NOT RESUSCITATE Family  Communication: care discussed with daughter, I gave updates today. We talk about consulting palliative care for goals of care. She will think about it.   Disposition Plan: to be determine.  DVT prophylaxis: holding eliquis due to thrombocytopenia.  Consultants: Pulmonary critical care Procedures: Echo 5/17 Left ventricle: The cavity size was normal. Systolic function was mildly to moderately reduced. The estimated ejection fraction was in the range of 40% to 45%. Regional wall motion abnormalities cannot be excluded. - Aortic valve: There was mild regurgitation. - Mitral valve: Calcified annulus. Mildly thickened leaflets . There was mild regurgitation. - Right ventricle: The cavity size was mildly dilated. Systolic function was mildly reduced. - Right atrium: The atrium was mildly dilated. - Pulmonary arteries: PA peak pressure: 32 mm Hg (S). - Pericardium, extracardiac: A trivial pericardial effusion was identified.   Antibiotics: Anti-infectives    Start     Dose/Rate Route Frequency Ordered Stop   02/10/15 2000  cephALEXin (KEFLEX) capsule 500 mg     500 mg Oral Every 8 hours 02/10/15 1212 02/10/15 2050   02/08/15 2000  cephALEXin (KEFLEX) capsule 500 mg  Status:  Discontinued     500 mg Oral Every 8 hours 02/08/15 1715 02/10/15 1212   02/05/15 1800  azithromycin (ZITHROMAX) 250 mg in dextrose 5 % 125 mL IVPB  Status:  Discontinued     250 mg 125 mL/hr over 60 Minutes Intravenous Every 24 hours 24-Feb-2015 2022 02/07/15 1148   02/24/2015 2200  linezolid (ZYVOX) IVPB 600 mg  Status:  Discontinued     600 mg 300 mL/hr over 60 Minutes Intravenous Every 12 hours February 24, 2015 2022 02/06/15 1224   Feb 24, 2015 2200  ceFEPIme (MAXIPIME) 2 g in dextrose 5 % 50 mL IVPB  Status:  Discontinued     2 g 100 mL/hr over 30 Minutes Intravenous Every 24 hours February 24, 2015 2057 02/08/15 1715   2015-02-24 1630  linezolid (ZYVOX) IVPB 600 mg  Status:  Discontinued     600 mg 300 mL/hr over 60  Minutes Intravenous  Once February 24, 2015 1620 February 24, 2015 2023   02-24-2015 1630  levofloxacin (LEVAQUIN) IVPB 500 mg     500 mg 100 mL/hr over 60 Minutes Intravenous  Once February 24, 2015 1620 02-24-2015 1810   02/24/2015 1500  cefTRIAXone (ROCEPHIN) 1 g in dextrose 5 % 50 mL IVPB     1 g 100 mL/hr over 30 Minutes Intravenous  Once 2015/02/24 1446 24-Feb-2015 1614      Objective: Filed Weights   02/16/15 2144 02/17/15 0540 02/18/15 0454  Weight: 98 kg (216 lb 0.8 oz) 99.8 kg (220 lb 0.3 oz) 98.7 kg (217 lb 9.5 oz)    Intake/Output Summary (Last 24 hours) at 02/18/15 1152 Last data filed at 02/18/15 0928  Gross per 24 hour  Intake    480 ml  Output      0 ml  Net    480 ml     Vitals Filed Vitals:   02/17/15 1436 02/17/15 2052 02/18/15 0454 02/18/15 0628  BP: 96/52 100/52 88/51 92/53   Pulse: 61 60 74 70  Temp: 97.5 F (36.4 C) 97.4 F (36.3 C) 95.4 F (35.2 C)   TempSrc: Oral Axillary Rectal   Resp: 16 17 18    Height:      Weight:  98.7 kg (217 lb 9.5 oz)   SpO2: 98% 98% 100%     Exam:  General:  Pt is alert, not in acute distress  HEENT: No icterus, No thrush, oral mucosa moist  Cardiovascular: regular rate and rhythm, S1/S2 No murmur  Respiratory: crackles b/l bases R> L-  Abdomen: Soft, +Bowel sounds,  tender, non distended, no guarding + abdominal wall edema  MSK: bilateral LE edema.   Data Reviewed: Basic Metabolic Panel:  Recent Labs Lab 02/14/15 0453 02/15/15 0557 02/16/15 0400 02/17/15 0457 02/17/15 1951 02/18/15 0500  NA 140 138 138 137  --  138  K 3.9 3.8 4.3 5.1  --  4.9  CL 101 98* 100* 101  --  99*  CO2 --  29  GLUCOSE 67 48* 64* 74 83 84  BUN 34* 33* 36* 39*  --  40*  CREATININE 1.26* 1.32* 1.52* 1.85*  --  2.04*  CALCIUM 8.7* 8.8* 8.7* 8.5*  --  8.8*   Liver Function Tests: No results for input(s): AST, ALT, ALKPHOS, BILITOT, PROT, ALBUMIN in the last 168 hours. No results for input(s): LIPASE, AMYLASE in the last 168 hours.  Recent  Labs Lab 02/17/15 1848  AMMONIA 71*   CBC:  Recent Labs Lab 02/12/15 0615  02/14/15 0453 02/14/15 1005 02/15/15 0557 02/16/15 0400 02/18/15 0500  WBC 5.5  < > 4.1 3.5* 4.1 4.1 4.2  NEUTROABS 3.3  --  2.1  --  1.9  --   --   HGB 9.9*  < > 9.6* 9.7* 9.5* 9.3* 9.0*  HCT 31.5*  < > 30.4* 30.8* 30.0* 29.8* 28.9*  MCV 102.6*  < > 101.0* 102.0* 102.7* 102.1* 104.0*  PLT 49*  < > 42* 47* 43* PLATELET CLUMPS NOTED ON SMEAR, COUNT APPEARS ADEQUATE 41*  < > = values in this interval not displayed. Cardiac Enzymes: No results for input(s): CKTOTAL, CKMB, CKMBINDEX, TROPONINI in the last 168 hours. BNP (last 3 results)  Recent Labs  02/20/2015 1318 02/08/15 0524 02/10/15 0534  BNP 840.0* 1285.4* 1210.7*    ProBNP (last 3 results) No results for input(s): PROBNP in the last 8760 hours.  CBG:  Recent Labs Lab 02/17/15 1935 02/17/15 2337 02/18/15 0333 02/18/15 0738 02/18/15 0820  GLUCAP 71 106* 71 67 84    Recent Results (from the past 240 hour(s))  Culture, blood (routine x 2)     Status: None (Preliminary result)   Collection Time: 02/14/15  8:40 AM  Result Value Ref Range Status   Specimen Description BLOOD LEFT ARM  Final   Special Requests BOTTLES DRAWN AEROBIC ONLY 2CC  Final   Culture   Final           BLOOD CULTURE RECEIVED NO GROWTH TO DATE CULTURE WILL BE HELD FOR 5 DAYS BEFORE ISSUING A FINAL NEGATIVE REPORT Performed at [facility]    Report Status PENDING  Incomplete  Culture, blood (routine x 2)     Status: None (Preliminary result)   Collection Time: 02/14/15  8:45 AM  Result Value Ref Range Status   Specimen Description BLOOD RIGHT ARM  Final   Special Requests BOTTLES DRAWN AEROBIC ONLY 3CC  Final   Culture   Final           BLOOD CULTURE RECEIVED NO GROWTH TO DATE CULTURE WILL BE HELD FOR 5 DAYS BEFORE ISSUING A FINAL NEGATIVE REPORT Performed at [facility]    Report Status PENDING  Incomplete  Urine culture     Status: None    Collection Time: 02/17/15 10:58 AM  Result Value Ref Range Status   Specimen Description URINE, RANDOM  Final   Special Requests NONE  Final   Colony Count   Final    75,000 COLONIES/ML Performed at [facility]    Culture YEAST Performed at [facility]   Final   Report Status 02/18/2015 FINAL  Final     Studies: No results found.  Scheduled Meds:  Scheduled Meds: . bisoprolol  5 mg Oral Daily  . cholecalciferol  5,000 Units Oral Daily  . colesevelam  1,875 mg Oral BID WC  . dextrose  25 mL Intravenous Once  . lactulose  10 g Oral Once  . lactulose  20 g Oral TID  . levothyroxine  50 mcg Intravenous Daily  . multivitamin  1 tablet Oral Daily  . nystatin   Topical BID  . pantoprazole  40 mg Oral QHS  . rOPINIRole  0.25 mg Oral TID   Continuous Infusions:   Time spent on care of this patient: 35 min   Alba Cory, MD [phone] 02/18/2015, 11:52 AM  LOS: 14 days   [facility] Office  [phone] Pager - Text Page per [email or web address] If 7PM-7AM, please contact night-coverage [email or web address]

## 2015-02-18 NOTE — Progress Notes (Signed)
Consulting cardiologist: Dr. Bryan Lemma  Seen for followup: CHF, atrial fibrillation  Subjective:    No chest pain, mild dyspnea, no sense of palpitations. Weak.  Objective:   Temp:  [95.4 F (35.2 C)-97.5 F (36.4 C)] 95.4 F (35.2 C) (06/11 0454) Pulse Rate:  [60-74] 70 (06/11 0628) Resp:  [16-18] 18 (06/11 0454) BP: (88-100)/(51-53) 92/53 mmHg (06/11 0628) SpO2:  [98 %-100 %] 100 % (06/11 0454) Weight:  [217 lb 9.5 oz (98.7 kg)] 217 lb 9.5 oz (98.7 kg) (06/11 0454) Last BM Date: 02/16/15  Filed Weights   02/16/15 2144 02/17/15 0540 02/18/15 0454  Weight: 216 lb 0.8 oz (98 kg) 220 lb 0.3 oz (99.8 kg) 217 lb 9.5 oz (98.7 kg)    Intake/Output Summary (Last 24 hours) at 02/18/15 0947 Last data filed at 02/18/15 0928  Gross per 24 hour  Intake    480 ml  Output      0 ml  Net    480 ml    Exam:  General: Obese, chronically ill appearing.  Lungs: Decreased breath sounds at bases.  Cardiac: Irregularly irregular. Elevated JVP.  Abdomen: Decreased bowel sounds.  Extremities: Chronic appearing firm edema 1-2+. Stasis.  Lab Results:  Basic Metabolic Panel:  Recent Labs Lab 02/16/15 0400 02/17/15 0457 02/17/15 1951 02/18/15 0500  NA 138 137  --  138  K 4.3 5.1  --  4.9  CL 100* 101  --  99*  CO2 28 27  --  29  GLUCOSE 64* 74 83 84  BUN 36* 39*  --  40*  CREATININE 1.52* 1.85*  --  2.04*  CALCIUM 8.7* 8.5*  --  8.8*    CBC:  Recent Labs Lab 02/15/15 0557 02/16/15 0400 02/18/15 0500  WBC 4.1 4.1 4.2  HGB 9.5* 9.3* 9.0*  HCT 30.0* 29.8* 28.9*  MCV 102.7* 102.1* 104.0*  PLT 43* PLATELET CLUMPS NOTED ON SMEAR, COUNT APPEARS ADEQUATE 41*    Echocardiogram 01/24/2015: Study Conclusions  - Left ventricle: The cavity size was normal. Systolic function was mildly to moderately reduced. The estimated ejection fraction was in the range of 40% to 45%. Regional wall motion abnormalities cannot be excluded. - Aortic valve: There was mild  regurgitation. - Mitral valve: Calcified annulus. Mildly thickened leaflets . There was mild regurgitation. - Right ventricle: The cavity size was mildly dilated. Systolic function was mildly reduced. - Right atrium: The atrium was mildly dilated. - Pulmonary arteries: PA peak pressure: 32 mm Hg (S). - Pericardium, extracardiac: A trivial pericardial effusion was identified.   Medications:   Scheduled Medications: . bisoprolol  5 mg Oral Daily  . cholecalciferol  5,000 Units Oral Daily  . colesevelam  1,875 mg Oral BID WC  . dextrose  25 mL Intravenous Once  . lactulose  10 g Oral Once  . lactulose  30 g Oral TID  . levothyroxine  75 mcg Oral QAC breakfast  . multivitamin  1 tablet Oral Daily  . nystatin   Topical BID  . pantoprazole  40 mg Oral QHS  . rOPINIRole  0.25 mg Oral TID     PRN Medications:  sodium chloride, acetaminophen, HYDROcodone-acetaminophen   Assessment:   1. Acute on chronic combined CHF with concurrent RV dysfunction. Lasix held June 10 with worsening renal function. Weight 217 which is down from peak of 231 on June 1 (prior weight when seen by CHF team in May was 210). Nonischemic cardiomyopathy with LVEF 40-45%.  2. Acute on  chronic renal insufficiency, baseline creatinine 1.2-1.5, now 2.0.  3. PAF, persistent as of ECG May 31. Eliquis on hold due to thrombocytopenia and current platelet count 41 (to resume per hematology when platelets over 50).  4. History of bioprosthetic AVR 2011 at [facility].   Plan/Discussion:    Continue bisoprolol. Low normal to low blood pressure and worsening renal insufficiency precludes ACE-I, ARB, and hydralazine. Patient with significant comorbidities as per IM note. Hold Lasix for now, followup creatinine in AM. Will decide when and if to consider going back to [facility] which see tolerated as outpatient (20 mg BID). Renal insufficiency likely multifactorial, perhaps component of ATN, diuresis and recent sepsis.  Holding off consideration of inotropes for now.   Jonelle Sidle, M.D., F.A.C.C.

## 2015-02-19 DIAGNOSIS — N189 Chronic kidney disease, unspecified: Secondary | ICD-10-CM

## 2015-02-19 DIAGNOSIS — J9621 Acute and chronic respiratory failure with hypoxia: Secondary | ICD-10-CM

## 2015-02-19 DIAGNOSIS — I5033 Acute on chronic diastolic (congestive) heart failure: Secondary | ICD-10-CM

## 2015-02-19 DIAGNOSIS — R400 Somnolence: Secondary | ICD-10-CM

## 2015-02-19 LAB — CBC
HEMATOCRIT: 29.4 % — AB (ref 36.0–46.0)
Hemoglobin: 9 g/dL — ABNORMAL LOW (ref 12.0–15.0)
MCH: 32 pg (ref 26.0–34.0)
MCHC: 30.6 g/dL (ref 30.0–36.0)
MCV: 104.6 fL — ABNORMAL HIGH (ref 78.0–100.0)
Platelets: 66 10*3/uL — ABNORMAL LOW (ref 150–400)
RBC: 2.81 MIL/uL — ABNORMAL LOW (ref 3.87–5.11)
RDW: 19 % — ABNORMAL HIGH (ref 11.5–15.5)
WBC: 7.2 10*3/uL (ref 4.0–10.5)

## 2015-02-19 LAB — GLUCOSE, CAPILLARY
GLUCOSE-CAPILLARY: 74 mg/dL (ref 65–99)
GLUCOSE-CAPILLARY: 89 mg/dL (ref 65–99)
GLUCOSE-CAPILLARY: 75 mg/dL (ref 65–99)
Glucose-Capillary: 107 mg/dL — ABNORMAL HIGH (ref 65–99)
Glucose-Capillary: 103 mg/dL — ABNORMAL HIGH (ref 65–99)
Glucose-Capillary: 79 mg/dL (ref 65–99)
Glucose-Capillary: 79 mg/dL (ref 65–99)

## 2015-02-19 LAB — BASIC METABOLIC PANEL
Anion gap: 8 (ref 5–15)
BUN: 40 mg/dL — ABNORMAL HIGH (ref 6–20)
CO2: 29 mmol/L (ref 22–32)
CREATININE: 2.33 mg/dL — AB (ref 0.44–1.00)
Calcium: 8.9 mg/dL (ref 8.9–10.3)
Chloride: 98 mmol/L — ABNORMAL LOW (ref 101–111)
GFR calc Af Amer: 21 mL/min — ABNORMAL LOW (ref >60)
GFR calc non Af Amer: 18 mL/min — ABNORMAL LOW (ref >60)
Glucose, Bld: 100 mg/dL — ABNORMAL HIGH (ref 65–99)
POTASSIUM: 5.1 mmol/L (ref 3.5–5.1)
SODIUM: 135 mmol/L (ref 135–145)

## 2015-02-19 LAB — LACTIC ACID, PLASMA
LACTIC ACID, VENOUS: 1.3 mmol/L (ref 0.5–2.0)
Lactic Acid, Venous: 1.4 mmol/L (ref 0.5–2.0)

## 2015-02-19 LAB — AMMONIA: Ammonia: 138 umol/L — ABNORMAL HIGH (ref 9–35)

## 2015-02-19 MED ORDER — LACTULOSE 10 GM/15ML PO SOLN: 30.0000 g | Freq: Three times a day (TID) | ORAL | Status: DC

## 2015-02-19 MED ORDER — LACTULOSE 10 GM/15ML PO SOLN
20.0000 g | Freq: Three times a day (TID) | ORAL | Status: DC
  Administered 2015-02-19 – 2015-02-20 (×3): 20 g via ORAL
  Filled 2015-02-19 (×5): qty 30

## 2015-02-19 MED ORDER — SODIUM CHLORIDE 0.9 % IV BOLUS (SEPSIS)
250.0000 mL | Freq: Once | INTRAVENOUS | Status: AC
  Administered 2015-02-19: 250 mL via INTRAVENOUS

## 2015-02-19 MED ORDER — FENTANYL CITRATE (PF) 100 MCG/2ML IJ SOLN
12.5000 ug | INTRAMUSCULAR | Status: DC | PRN
  Administered 2015-02-20: 12.5 ug via INTRAVENOUS
  Filled 2015-02-19: qty 2

## 2015-02-19 MED ORDER — HYDROCORTISONE NA SUCCINATE PF 100 MG IJ SOLR
50.0000 mg | Freq: Four times a day (QID) | INTRAMUSCULAR | Status: DC
  Administered 2015-02-19 – 2015-02-20 (×4): 50 mg via INTRAVENOUS
  Filled 2015-02-19 (×8): qty 1

## 2015-02-19 MED ORDER — PIPERACILLIN-TAZOBACTAM IN DEX 2-0.25 GM/50ML IV SOLN
2.2500 g | Freq: Four times a day (QID) | INTRAVENOUS | Status: DC
  Administered 2015-02-19 – 2015-02-20 (×3): 2.25 g via INTRAVENOUS
  Filled 2015-02-19 (×9): qty 50

## 2015-02-19 MED ORDER — SODIUM CHLORIDE 0.9 % IV BOLUS (SEPSIS)
200.0000 mL | Freq: Once | INTRAVENOUS | Status: AC
  Administered 2015-02-19: 200 mL via INTRAVENOUS

## 2015-02-19 NOTE — Progress Notes (Addendum)
I discussed with daughter, they have decided no IV pressors. They want to see if IV hydrocortisone will help. I have order 250 cc bolus, Patient BP drop to 70. Bear hugger removed early as recommended by CCM. Daughter agree with fentanyl for respiratory distress as needed. She understand that this can decrease BP further, but she does not want her mother to suffer.  Hartley Barefoot, MD.

## 2015-02-19 NOTE — Progress Notes (Signed)
ANTIBIOTIC CONSULT NOTE - INITIAL  Pharmacy Consult for Zosyn Indication: aspiration pneumonia  Allergies  Allergen Reactions  . Morphine And Related     Stevens-Johnsons  . Bacitracin Rash  . Betadine [Povidone Iodine] Rash  . Clindamycin/Lincomycin Rash  . Other Rash    Lincomycin  . Tape Itching and Rash    Please use "paper" tape.  . Vancomycin Rash    Patient Measurements: Height:  (154.9 cm) Weight: 215 lb 2.7 oz (97.6 kg) IBW/kg (Calculated) : 47.8   Vital Signs: Temp: 94.8 Camacho (34.9 C) (06/12 1332) Temp Source: Rectal (06/12 1332) BP: 103/50 mmHg (06/12 1614) Pulse Rate: 60 (06/12 1332) Intake/Output from previous day: 06/11 0701 - 06/12 0700 In: 240 [P.O.:240] Out: -  Intake/Output from this shift: Total I/O In: 60 [P.O.:60] Out: -   Labs:  Recent Labs  02/17/15 0457 02/18/15 0500 02/19/15 0434  WBC  --  4.2 7.2  HGB  --  9.0* 9.0*  PLT  --  41* 66*  CREATININE 1.85* 2.04* 2.33*   Estimated Creatinine Clearance: 20.2 mL/min (by C-G formula based on Cr of 2.33). No results for input(s): VANCOTROUGH, VANCOPEAK, VANCORANDOM, GENTTROUGH, GENTPEAK, GENTRANDOM, TOBRATROUGH, TOBRAPEAK, TOBRARND, AMIKACINPEAK, AMIKACINTROU, AMIKACIN in the last 72 hours.   Microbiology: Recent Results (from the past 720 hour(s))  MRSA PCR Screening     Status: None   Collection Time: 01/25/15  5:41 PM  Result Value Ref Range Status   MRSA by PCR NEGATIVE NEGATIVE Final    Comment:        The GeneXpert MRSA Assay (FDA approved for NASAL specimens only), is one component of a comprehensive MRSA colonization surveillance program. It is not intended to diagnose MRSA infection nor to guide or monitor treatment for MRSA infections.   Blood culture (routine x 2)     Status: None   Collection Time: 01/12/2015  1:18 PM  Result Value Ref Range Status   Specimen Description LEFT ANTECUBITAL  Final   Special Requests BOTTLES DRAWN AEROBIC ONLY 8CC  Final   Culture  NO GROWTH 5 DAYS  Final   Report Status 02/09/2015 FINAL  Final  Urine culture     Status: None   Collection Time: 01/09/2015  2:20 PM  Result Value Ref Range Status   Specimen Description URINE, CATHETERIZED  Final   Special Requests NONE  Final   Colony Count   Final    >=100,000 COLONIES/ML Performed at [facility]    Culture   Final    ESCHERICHIA COLI KLEBSIELLA PNEUMONIAE Performed at [facility]    Report Status 02/08/2015 FINAL  Final   Organism ID, Bacteria ESCHERICHIA COLI  Final   Organism ID, Bacteria KLEBSIELLA PNEUMONIAE  Final      Susceptibility   Escherichia coli - MIC*    AMPICILLIN >=32 RESISTANT Resistant     CEFAZOLIN <=4 SENSITIVE Sensitive     CEFTRIAXONE <=1 SENSITIVE Sensitive     CIPROFLOXACIN >=4 RESISTANT Resistant     GENTAMICIN >=16 RESISTANT Resistant     LEVOFLOXACIN >=8 RESISTANT Resistant     NITROFURANTOIN <=16 SENSITIVE Sensitive     TOBRAMYCIN >=16 RESISTANT Resistant     TRIMETH/SULFA <=20 SENSITIVE Sensitive     PIP/TAZO 8 SENSITIVE Sensitive     * ESCHERICHIA COLI   Klebsiella pneumoniae - MIC*    AMPICILLIN RESISTANT      CEFAZOLIN <=4 SENSITIVE Sensitive     CEFTRIAXONE <=1 SENSITIVE Sensitive  CIPROFLOXACIN <=0.25 SENSITIVE Sensitive     GENTAMICIN <=1 SENSITIVE Sensitive     LEVOFLOXACIN <=0.12 SENSITIVE Sensitive     NITROFURANTOIN 64 INTERMEDIATE Intermediate     TOBRAMYCIN <=1 SENSITIVE Sensitive     TRIMETH/SULFA <=20 SENSITIVE Sensitive     PIP/TAZO <=4 SENSITIVE Sensitive     * KLEBSIELLA PNEUMONIAE  Culture, Urine     Status: None   Collection Time: 01/26/2015  7:08 PM  Result Value Ref Range Status   Specimen Description URINE, CATHETERIZED  Final   Special Requests NONE  Final   Colony Count   Final    >=100,000 COLONIES/ML Performed at [facility]    Culture   Final    Multiple bacterial morphotypes present, none predominant. Suggest appropriate recollection if clinically  indicated. Performed at [facility]    Report Status 02/06/2015 FINAL  Final  Culture, blood (routine x 2)     Status: None (Preliminary result)   Collection Time: 02/14/15  8:40 AM  Result Value Ref Range Status   Specimen Description BLOOD LEFT ARM  Final   Special Requests BOTTLES DRAWN AEROBIC ONLY 2CC  Final   Culture   Final           BLOOD CULTURE RECEIVED NO GROWTH TO DATE CULTURE WILL BE HELD FOR 5 DAYS BEFORE ISSUING A FINAL NEGATIVE REPORT Performed at [facility]    Report Status PENDING  Incomplete  Culture, blood (routine x 2)     Status: None (Preliminary result)   Collection Time: 02/14/15  8:45 AM  Result Value Ref Range Status   Specimen Description BLOOD RIGHT ARM  Final   Special Requests BOTTLES DRAWN AEROBIC ONLY 3CC  Final   Culture   Final           BLOOD CULTURE RECEIVED NO GROWTH TO DATE CULTURE WILL BE HELD FOR 5 DAYS BEFORE ISSUING A FINAL NEGATIVE REPORT Performed at [facility]    Report Status PENDING  Incomplete  Urine culture     Status: None   Collection Time: 02/17/15 10:58 AM  Result Value Ref Range Status   Specimen Description URINE, RANDOM  Final   Special Requests NONE  Final   Colony Count   Final    75,000 COLONIES/ML Performed at [facility]    Culture YEAST Performed at [facility]   Final   Report Status 02/18/2015 FINAL  Final    Medical History: Past Medical History  Diagnosis Date  . Anginal pain   . Hypertension   . Heart murmur   . CHF (congestive heart failure)   . Shortness of breath   . Complication of anesthesia     "I had a slight reaction to the morphine given in surgery"  . High cholesterol   . Coronary artery disease   . Myocardial infarction 12/2013  . Pneumonia "once or twice"  . 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 Type II diabetes mellitus   . Anemia   . History of blood transfusion     "related to the anemia"  . GERD (gastroesophageal reflux  disease)   . Arthritis     "all over"  . Chronic lower back pain     Medications:  Prescriptions prior to admission  Medication Sig Dispense Refill Last Dose  . acetaminophen (TYLENOL) 325 MG tablet Take 650 mg by mouth every 4 (four) hours as needed for mild pain.  01/27/2015  . apixaban (ELIQUIS) 2.5 MG TABS tablet Take 1 tablet (2.5 mg total) by mouth 2 (two) times daily.   01/17/2015 at 09:00  . Cholecalciferol (VITAMIN D3) 5000 UNITS CAPS Take 5,000 Units by mouth daily.   01/29/2015  . colesevelam (WELCHOL) 625 MG tablet Take 1,875 mg by mouth 2 (two) times daily with a meal.   01/20/2015  . docusate sodium (COLACE) 100 MG capsule Take 1 capsule (100 mg total) by mouth 2 (two) times daily. 10 capsule 0 01/20/2015  . ferrous fumarate (HEMOCYTE - 106 MG FE) 325 (106 FE) MG TABS tablet Take 1 tablet by mouth daily.   01/13/2015  . gabapentin (NEURONTIN) 100 MG capsule Take 1 capsule (100 mg total) by mouth 3 (three) times daily.   01/11/2015  . lactulose (CHRONULAC) 10 GM/15ML solution Take 15 mLs (10 g total) by mouth 2 (two) times daily.  0 never at Unknown time  . levothyroxine (SYNTHROID, LEVOTHROID) 25 MCG tablet Take 1 tablet (25 mcg total) by mouth daily before breakfast.   01/26/2015  . Multiple Vitamins-Minerals (ICAPS) CAPS Take 2 capsules by mouth daily.    01/09/2015  . Nutritional Supplements (RA MELATONIN/B-6 PO) Take 6 mg by mouth at bedtime.   02/03/2015  . pantoprazole (PROTONIX) 40 MG tablet Take 1 tablet (40 mg total) by mouth at bedtime.   02/03/2015  . potassium chloride SA (K-DUR,KLOR-CON) 20 MEQ tablet Take 2 tablets (40 mEq total) by mouth daily. (Patient taking differently: Take 40 mEq by mouth 2 (two) times daily. ) 30 tablet 0 01/11/2015  . rOPINIRole (REQUIP) 0.5 MG tablet Take 0.5 mg by mouth at bedtime.   02/03/2015  . bisoprolol (ZEBETA) 5 MG tablet Take 1 tablet (5 mg total) by mouth daily. (Patient taking differently: Take 10 mg by mouth daily. ) 30 tablet 11 unknown   . hydrocortisone (ANUSOL-HC) 2.5 % rectal cream Place rectally 3 (three) times daily. (Patient taking differently: Place 1 application rectally 3 (three) times daily as needed for hemorrhoids. ) 30 g 0 01/27/2015  . oxycodone (OXY-IR) 5 MG capsule Take 1 capsule (5 mg total) by mouth every 6 (six) hours as needed for pain. 10 capsule 0 01/25/2015  . torsemide (DEMADEX) 20 MG tablet Take 1 tablet (20 mg total) by mouth 2 (two) times daily.   never   Scheduled:  . bisoprolol  5 mg Oral Daily  . cholecalciferol  5,000 Units Oral Daily  . colesevelam  1,875 mg Oral BID WC  . dextrose  25 mL Intravenous Once  . lactulose  10 g Oral Once  . lactulose  20 g Oral TID  . levothyroxine  50 mcg Intravenous Daily  . multivitamin  1 tablet Oral Daily  . nystatin   Topical BID  . pantoprazole  40 mg Oral QHS  . piperacillin-tazobactam (ZOSYN)  IV  2.25 g Intravenous 4 times per day  . rOPINIRole  0.25 mg Oral TID   Assessment: Melanie Camacho transferred from [facility] for AMS likely 2/2 acute delirium with possible UTI on 02/01/2015. Now with possible aspiration PNA. WBC 7.2, SCr is trending up with est. Cr cl of 20 mL/min. Pt is hypothermic at 94.55F  Plan:  -Initiate Zosyn 2.25 gm IV q6h (since renal function has been slowly worsening) -Monitor renal function, clinical status, cultures  Isaac Bliss, PharmD, BCPS Clinical Pharmacist Pager [phone] 02/19/2015 4:45 PM

## 2015-02-19 NOTE — Progress Notes (Signed)
Consulting cardiologist: Dr. Bryan Lemma  Seen for followup: CHF, atrial fibrillation  Subjective:    Weak, no chest pain or palpitations.  Objective:   Temp:  [96.4 F (35.8 C)] 96.4 F (35.8 C) (06/11 1537) Pulse Rate:  [51-72] 72 (06/12 0631) Resp:  [20-21] 21 (06/12 0631) BP: (85-95)/(43-61) 85/43 mmHg (06/12 0631) SpO2:  [96 %-100 %] 96 % (06/12 0631) Weight:  [215 lb 2.7 oz (97.6 kg)] 215 lb 2.7 oz (97.6 kg) (06/12 0631) Last BM Date: 02/18/15  Filed Weights   02/17/15 0540 02/18/15 0454 02/19/15 0631  Weight: 220 lb 0.3 oz (99.8 kg) 217 lb 9.5 oz (98.7 kg) 215 lb 2.7 oz (97.6 kg)    Intake/Output Summary (Last 24 hours) at 02/19/15 0924 Last data filed at 02/18/15 0928  Gross per 24 hour  Intake    240 ml  Output      0 ml  Net    240 ml    Exam:  General: Obese, chronically ill appearing.  Lungs: Decreased breath sounds at bases.  Cardiac: Irregularly irregular. Elevated JVP.  Abdomen: Decreased bowel sounds.  Extremities: Chronic appearing firm edema 1-2+. Stasis.  Lab Results:  Basic Metabolic Panel:  Recent Labs Lab 02/17/15 0457 02/17/15 1951 02/18/15 0500 02/19/15 0434  NA 137  --  138 135  K 5.1  --  4.9 5.1  CL 101  --  99* 98*  CO2 27  --  29 29  GLUCOSE 74 83 84 100*  BUN 39*  --  40* 40*  CREATININE 1.85*  --  2.04* 2.33*  CALCIUM 8.5*  --  8.8* 8.9    CBC:  Recent Labs Lab 02/16/15 0400 02/18/15 0500 02/19/15 0434  WBC 4.1 4.2 7.2  HGB 9.3* 9.0* 9.0*  HCT 29.8* 28.9* 29.4*  MCV 102.1* 104.0* 104.6*  PLT PLATELET CLUMPS NOTED ON SMEAR, COUNT APPEARS ADEQUATE 41* 66*    Echocardiogram 01/24/2015: Study Conclusions  - Left ventricle: The cavity size was normal. Systolic function was mildly to moderately reduced. The estimated ejection fraction was in the range of 40% to 45%. Regional wall motion abnormalities cannot be excluded. - Aortic valve: There was mild regurgitation. - Mitral valve: Calcified  annulus. Mildly thickened leaflets . There was mild regurgitation. - Right ventricle: The cavity size was mildly dilated. Systolic function was mildly reduced. - Right atrium: The atrium was mildly dilated. - Pulmonary arteries: PA peak pressure: 32 mm Hg (S). - Pericardium, extracardiac: A trivial pericardial effusion was identified.   Medications:   Scheduled Medications: . bisoprolol  5 mg Oral Daily  . cholecalciferol  5,000 Units Oral Daily  . colesevelam  1,875 mg Oral BID WC  . dextrose  25 mL Intravenous Once  . gabapentin  100 mg Oral Daily  . lactulose  10 g Oral Once  . lactulose  20 g Oral TID  . levothyroxine  50 mcg Intravenous Daily  . multivitamin  1 tablet Oral Daily  . nystatin   Topical BID  . pantoprazole  40 mg Oral QHS  . rOPINIRole  0.25 mg Oral TID    PRN Medications: sodium chloride, acetaminophen, HYDROcodone-acetaminophen, simethicone   Assessment:   1. Acute on chronic combined CHF with concurrent RV dysfunction. Lasix held June 10 with worsening renal function. Weight 215 which is down from peak of 231 on June 1 (prior weight when seen by CHF team in May was 210). Nonischemic cardiomyopathy with LVEF 40-45%.  2. Acute on chronic  renal insufficiency, baseline creatinine 1.2-1.5, now 2.3.  3. PAF, persistent as of ECG May 31. Eliquis on hold due to thrombocytopenia (to resume per hematology when platelets over 50).  4. History of bioprosthetic AVR 2011 at [facility].   Plan/Discussion:    Continue bisoprolol. Hold Lasix. Weight is actually down so no rush to resume diuretics. Will decide when and if to consider going back to [facility] which see tolerated as outpatient (20 mg BID). Renal insufficiency likely multifactorial, perhaps component of ATN, diuresis and recent sepsis. Platelets now over 50 so may be able to resume Eliquis as per Hematology. Holding off consideration of inotropes for now.   Jonelle Sidle, M.D., F.A.C.C.

## 2015-02-19 NOTE — Progress Notes (Signed)
HR dipped into 20's nonsustained. BP 75/43. Temp 94.9 core.  Paged coverage.  250 Bolus was ordered.  Will continue to monitor.

## 2015-02-19 NOTE — Progress Notes (Signed)
02/19/2015 Patient transfer from 6 north to 2 central at 1735. Patient respond to voice, was not able to say where she was. Alert to self. Patient  Was place on a bear hugger temp rectally was 94.5. Patient blood pressure was in the 70's to low 90's when arrive on unit. Patient have skin tear on left arm foam dressing in place. Excoriation on bilateral legs, heels dry and upper back dry area. Bruising noted generalized and patient is third spacing. [facility] RN.

## 2015-02-19 NOTE — Progress Notes (Addendum)
[facility] Progress Note   DAZIYAH COGAN ZOX:[id number] DOB: 1931-10-10 DOA: 01/17/2015 PCP: PROVIDER NOT IN SYSTEM  Brief narrative: Melanie Camacho is a 81 y.o. female with CHF (EF 40-45%), PAF on Eliquis, CKD 3, possible small vessel CAD (clean cath 4/22), DMII who presented to [facility] as a transfer from a rehab center for acute delirium due to a UTI. Found to have septic shock and admitted to ICU. Started on pressors and stress dose steroids.  The patient was hospitalized from 4/28 through 5/24 acute on chronic diastolic heart failure as well .  After being transferred out of the ICU, she was still requiring oxygen with a CXR and exam consistent with pulm edema. I continued to diurese her with IV Lasix and she was eventually able to be weaned off of O2. At the same time her platelets were noted to be be steadily dropping which was determined to be secondary to antibiotics given earlier in the admission. The platelets count has increased today.  She was found last night to be hypothermic.   Subjective: Sleepy , open eyes, answer questions. Goes back to sleep.  She relates feeling ok. Abdominal pain has resolved. She had 5 BM yesterday.   Assessment/Plan:  Hypothermia;  - cause uncertain- will correct hypothyroidism. Might be related to hypoglycemia.  - TSH elevated but F t4 also slightly elevated- Synthroid increase  to 75 mcg -Cortisol normal. Blood culture no growth to date.  - Dr Lucianne Muss endocrinologist sa patient in consultation.  -Urine culture growing yeast. .  Synthroid change to IV.   Hypoglycemia:  Continue to check CBG insulin level , C peptide, and sulfonylurea level pending,.  Frequents meals, snacks.   Encephalopathy;  -elevated ammonia, hypoglycemia.  -continue with lactulose.   Abdominal pain:  Complaining of cramping abdominal pain.  Abdominal pain better with PRN  simethicone. Suspect is from lactulose.    Thrombocytopenia - platelet were noted to be  steadily dropping during the hospital stay -Discussed with hematology-review of medications reveals that she was given Zyvox and cefepime on 5/28 both of which can cause this. -  Eliquis has been on hold- per hematology, Eliquis can be resumed once platelets found to be improving and greater than 50. If platelet continue to be elevated 6-13 will consider resuming eliquis.     Septic shock/UTI -Urine culture reveals Escherichia coli -Antibiotics started on 5/28- narrowed to Keflex based on sensitivities for a seven-day course completed on 6/3.  Acute respiratory failure-acute on chronic systolic heart failure -EF 40-45% -Chest x-ray reveals bilateral effusions and right basilar infiltrate versus atelectasis -Weight when discharged was down to about 95 kg-  -Weight: 104--101-----96---98--99 -Appreciate Cardiology help.  -Chest x ray with increase pleural effusion loculated.  Will defer lasix to cardiology/ Awaiting palliative evaluation to see if will pursue thoracentesis.    AKI on  CKD (chronic kidney disease), stage III -Baseline creatinine is about 1.2--1.5 -Cr at 2.3. Continue to hold lasix.   Mildly elevated troponin -On 5/28 troponin was noted to be 0.04>0.05 > 0.04> 0.06 -Likely secondary to acute infection/septic shock-no further management  PAF - Holding Eliquis secondary to thrombocytopenia -Continue bisoprolol  Recent shingles -left upper back-completed treatment with valacyclovir  Hypotension -resumed Bisoprolol to prevent A. fib with RVR -holder parameter for bisoprolol/   Diabetes mellitus -Diet controlled-follow CBGs -insulin level, c peptide normal -Multifactorial, multiple organ system failure. Marland Kitchen Appreciate Dr Lucianne Muss evaluation.   Hypothyroidism -TSH markedly elevated at 29. T 4 at 1.1. -synthroid  IV  -Appreciate Dr Lucianne Muss help.   S/P AVR-(tissue) 2011 at [facility] - no CABG with this  Obesity- BMI 45   Code Status: DO NOT RESUSCITATE Family  Communication: care discussed with daughter, I gave updates today. Palliative care consulted for goals of care.   Disposition Plan: to be determine.  DVT prophylaxis: holding eliquis due to thrombocytopenia.  Consultants: Pulmonary critical care Procedures: Echo 5/17 Left ventricle: The cavity size was normal. Systolic function was mildly to moderately reduced. The estimated ejection fraction was in the range of 40% to 45%. Regional wall motion abnormalities cannot be excluded. - Aortic valve: There was mild regurgitation. - Mitral valve: Calcified annulus. Mildly thickened leaflets . There was mild regurgitation. - Right ventricle: The cavity size was mildly dilated. Systolic function was mildly reduced. - Right atrium: The atrium was mildly dilated. - Pulmonary arteries: PA peak pressure: 32 mm Hg (S). - Pericardium, extracardiac: A trivial pericardial effusion was identified.   Antibiotics: Anti-infectives    Start     Dose/Rate Route Frequency Ordered Stop   02/10/15 2000  cephALEXin (KEFLEX) capsule 500 mg     500 mg Oral Every 8 hours 02/10/15 1212 02/10/15 2050   02/08/15 2000  cephALEXin (KEFLEX) capsule 500 mg  Status:  Discontinued     500 mg Oral Every 8 hours 02/08/15 1715 02/10/15 1212   02/05/15 1800  azithromycin (ZITHROMAX) 250 mg in dextrose 5 % 125 mL IVPB  Status:  Discontinued     250 mg 125 mL/hr over 60 Minutes Intravenous Every 24 hours 21-Feb-2015 2022 02/07/15 1148   03/01/2015 2200  linezolid (ZYVOX) IVPB 600 mg  Status:  Discontinued     600 mg 300 mL/hr over 60 Minutes Intravenous Every 12 hours 02/16/2015 2022 02/06/15 1224   02/19/2015 2200  ceFEPIme (MAXIPIME) 2 g in dextrose 5 % 50 mL IVPB  Status:  Discontinued     2 g 100 mL/hr over 30 Minutes Intravenous Every 24 hours 02/28/2015 2057 02/08/15 1715   03/04/2015 1630  linezolid (ZYVOX) IVPB 600 mg  Status:  Discontinued     600 mg 300 mL/hr over 60 Minutes Intravenous  Once 02/18/2015 1620  02/10/2015 2023   02/10/2015 1630  levofloxacin (LEVAQUIN) IVPB 500 mg     500 mg 100 mL/hr over 60 Minutes Intravenous  Once 02/08/2015 1620 21-Feb-2015 1810   02/13/2015 1500  cefTRIAXone (ROCEPHIN) 1 g in dextrose 5 % 50 mL IVPB     1 g 100 mL/hr over 30 Minutes Intravenous  Once 03/06/2015 1446 02/17/2015 1614      Objective: Filed Weights   02/17/15 0540 02/18/15 0454 02/19/15 0631  Weight: 99.8 kg (220 lb 0.3 oz) 98.7 kg (217 lb 9.5 oz) 97.6 kg (215 lb 2.7 oz)   No intake or output data in the 24 hours ending 02/19/15 1002   Vitals Filed Vitals:   02/18/15 0628 02/18/15 1537 02/18/15 2244 02/19/15 0631  BP: 92/53  95/61 85/43  Pulse: 70  51 72  Temp:  96.4 F (35.8 C)    TempSrc:  Oral    Resp:   20 21  Height:      Weight:    97.6 kg (215 lb 2.7 oz)  SpO2:   100% 96%    Exam:  General: patient sleepy, wake up answer some questions.   HEENT: No icterus, No thrush, oral mucosa moist  Cardiovascular: regular rate and rhythm, S1/S2 No murmur  Respiratory: bilateral air movement, no significant crackles.  Abdomen: Soft, +Bowel sounds,  tender, non distended, no guarding + abdominal wall edema  MSK: bilateral LE edema.   Data Reviewed: Basic Metabolic Panel:  Recent Labs Lab 02/15/15 0557 02/16/15 0400 02/17/15 0457 02/17/15 1951 02/18/15 0500 02/19/15 0434  NA 138 138 137  --  138 135  K 3.8 4.3 5.1  --  4.9 5.1  CL 98* 100* 101  --  99* 98*  CO2 --  29 29  GLUCOSE 48* 64* 74 83 84 100*  BUN 33* 36* 39*  --  40* 40*  CREATININE 1.32* 1.52* 1.85*  --  2.04* 2.33*  CALCIUM 8.8* 8.7* 8.5*  --  8.8* 8.9   Liver Function Tests: No results for input(s): AST, ALT, ALKPHOS, BILITOT, PROT, ALBUMIN in the last 168 hours. No results for input(s): LIPASE, AMYLASE in the last 168 hours.  Recent Labs Lab 02/17/15 1848 02/18/15 1253  AMMONIA 71* 60*   CBC:  Recent Labs Lab 02/14/15 0453 02/14/15 1005 02/15/15 0557 02/16/15 0400 02/18/15 0500  02/19/15 0434  WBC 4.1 3.5* 4.1 4.1 4.2 7.2  NEUTROABS 2.1  --  1.9  --   --   --   HGB 9.6* 9.7* 9.5* 9.3* 9.0* 9.0*  HCT 30.4* 30.8* 30.0* 29.8* 28.9* 29.4*  MCV 101.0* 102.0* 102.7* 102.1* 104.0* 104.6*  PLT 42* 47* 43* PLATELET CLUMPS NOTED ON SMEAR, COUNT APPEARS ADEQUATE 41* 66*   Cardiac Enzymes: No results for input(s): CKTOTAL, CKMB, CKMBINDEX, TROPONINI in the last 168 hours. BNP (last 3 results)  Recent Labs  02-22-2015 1318 02/08/15 0524 02/10/15 0534  BNP 840.0* 1285.4* 1210.7*    ProBNP (last 3 results) No results for input(s): PROBNP in the last 8760 hours.  CBG:  Recent Labs Lab 02/18/15 1622 02/18/15 2007 02/19/15 0005 02/19/15 0405 02/19/15 0801  GLUCAP 105* 102* 107* 103* 79    Recent Results (from the past 240 hour(s))  Culture, blood (routine x 2)     Status: None (Preliminary result)   Collection Time: 02/14/15  8:40 AM  Result Value Ref Range Status   Specimen Description BLOOD LEFT ARM  Final   Special Requests BOTTLES DRAWN AEROBIC ONLY 2CC  Final   Culture   Final           BLOOD CULTURE RECEIVED NO GROWTH TO DATE CULTURE WILL BE HELD FOR 5 DAYS BEFORE ISSUING A FINAL NEGATIVE REPORT Performed at [facility]    Report Status PENDING  Incomplete  Culture, blood (routine x 2)     Status: None (Preliminary result)   Collection Time: 02/14/15  8:45 AM  Result Value Ref Range Status   Specimen Description BLOOD RIGHT ARM  Final   Special Requests BOTTLES DRAWN AEROBIC ONLY 3CC  Final   Culture   Final           BLOOD CULTURE RECEIVED NO GROWTH TO DATE CULTURE WILL BE HELD FOR 5 DAYS BEFORE ISSUING A FINAL NEGATIVE REPORT Performed at [facility]    Report Status PENDING  Incomplete  Urine culture     Status: None   Collection Time: 02/17/15 10:58 AM  Result Value Ref Range Status   Specimen Description URINE, RANDOM  Final   Special Requests NONE  Final   Colony Count   Final    75,000 COLONIES/ML Performed at  [facility]    Culture YEAST Performed at [facility]   Final   Report Status 02/18/2015 FINAL  Final     Studies: Dg Chest 1 View  02/18/2015   CLINICAL DATA:  81 year old female with history of congestive heart failure. Shortness of breath. Generalized weakness. History of heart valve replacement in April 2015.  EXAM: CHEST  1 VIEW  COMPARISON:  Chest x-ray 02/06/2015.  FINDINGS: Worsening opacities throughout the right mid to lower lung. Partially loculated right-sided pleural effusion increasing, moderate to large in size. Opacity at the left lung base again noted. Moderate left pleural effusion unchanged. No evidence of pulmonary edema. Cardiac silhouette is largely obscured, but heart size appears mildly enlarged. Upper mediastinal contours are distorted by patient's positioning. Atherosclerosis in the thoracic aorta. Status post hemi median sternotomy. Markers in place in the expected location of the aortic annulus suggesting prior aortic valve replacement.  IMPRESSION: 1. Increasing moderate to large partially loculated right-sided pleural effusion with worsening areas of atelectasis and/or consolidation throughout the right mid to lower lung. 2. Persistent atelectasis and/or consolidation in the left lower lobe with superimposed moderate left pleural effusion. 3. Mild cardiomegaly. 4. Atherosclerosis. 5. Postoperative changes, as above.   Electronically Signed   By: Trudie Reed M.D.   On: 02/18/2015 16:01    Scheduled Meds:  Scheduled Meds: . bisoprolol  5 mg Oral Daily  . cholecalciferol  5,000 Units Oral Daily  . colesevelam  1,875 mg Oral BID WC  . dextrose  25 mL Intravenous Once  . gabapentin  100 mg Oral Daily  . lactulose  10 g Oral Once  . lactulose  20 g Oral TID  . levothyroxine  50 mcg Intravenous Daily  . multivitamin  1 tablet Oral Daily  . nystatin   Topical BID  . pantoprazole  40 mg Oral QHS  . rOPINIRole  0.25 mg Oral TID   Continuous  Infusions:   Time spent on care of this patient: 35 min   Regalado, Prentiss Bells, MD [phone] 02/19/2015, 10:02 AM  LOS: 15 days   [facility] Office  [phone] Pager - Text Page per [email or web address] If 7PM-7AM, please contact night-coverage [email or web address]   Patient more lethargic. Her BP is today lower, consistently low. Hypothermia still present.  Chest x ray with loculated effusion. Warmer at bedside not working. Patient will be transfer to step down for bear hugger. Check lactic acid. Start zosyn to cover for infection. Ammonia level pending. Continue with lactulose. Discussed with Daughter, about multiple medical problems and no improvement of condition. Family would like for now if needed IV pressors. Still with current Code status: DNI/DNR.  CCM consulted.    Belkys Regalado, md

## 2015-02-19 NOTE — Progress Notes (Signed)
02/19/2015 Patient systolic blood pressure in the 70's to 80's Dr Sunnie Nielsen was made aware. Order were given for a 200 cc bolus. Before bolus was given at 1913 blood pressure was 70/53.[facility] RN.

## 2015-02-19 NOTE — Progress Notes (Signed)
02/19/2015 Dr Sunnie Nielsen  Per Dr Molli Knock hold bear hugger for now, because it can drop blood pressure. [facility] RN.

## 2015-02-19 NOTE — Progress Notes (Addendum)
PULMONARY / CRITICAL CARE MEDICINE HISTORY AND PHYSICAL EXAMINATION   Name: Melanie Camacho MRN: [id number] DOB: 21-Mar-1932    ADMISSION DATE:  28-Feb-2015  PRIMARY SERVICE: PCCM  CHIEF COMPLAINT:  AMS  BRIEF PATIENT DESCRIPTION: 11 F with CHF (EF 40-45%), possible small vessel CAD (clean cath 4/22), DMII who presented to [facility] as a transfer from a rehab center for AMS. Likely 2/2 acute delirium 2/2 UTI.   SIGNIFICANT EVENTS / STUDIES:  CT Head 5/28 --> No acute intracranial abnormality.   LINES / TUBES: PIV  CULTURES: Urine Culture 5/28> 100K >> Blood Cultures x 2, 5/28  ANTIBIOTICS: Cefepime 5/28 - off Linezolid 5/28 - (Vanc allergy) Levaquin x 1, 5/28  Zosyn on currently  SUBJECTIVE:  Unresponsive but opens eyes to stimulation.  VITAL SIGNS: Temp:  [94.6 F (34.8 C)-94.8 F (34.9 C)] 94.8 F (34.9 C) (06/12 1332) Pulse Rate:  [51-72] 60 (06/12 1332) Resp:  [20-21] 20 (06/12 1332) BP: (85-103)/(24-61) 103/50 mmHg (06/12 1614) SpO2:  [96 %-100 %] 100 % (06/12 1332) Weight:  [97.6 kg (215 lb 2.7 oz)] 97.6 kg (215 lb 2.7 oz) (06/12 0631)  HEMODYNAMICS:    VENTILATOR SETTINGS:    INTAKE / OUTPUT: Intake/Output      06/11 0701 - 06/12 0700 06/12 0701 - 06/13 0700   P.O. 240 60   Total Intake(mL/kg) 240 (2.5) 60 (0.6)   Net +240 +60        Urine Occurrence 5 x 1 x   Stool Occurrence 3 x     PHYSICAL EXAMINATION: General:  Elderly female, arousable but quickly falls back asleep.  Neuro:  Does not follow commands but opens eyes to pain. HEENT:  [place]/AT, PERRL, EOM-spontaneous, DMM. Neck: Supple, -JVD Cardiovascular:  Irr Rate/Rhythem, NS1/S2, (-) MRG Lungs:  Diffuse crackles. Abdomen:  Obese, NT/ND/(+)BS Musculoskeletal:  2+ edema to knees bilaterally, LE wrapped Skin:  Healing shingles rash on R buttock/RLE  LABS:  CBC  Recent Labs Lab 02/16/15 0400 02/18/15 0500 02/19/15 0434  WBC 4.1 4.2 7.2  HGB 9.3* 9.0* 9.0*  HCT 29.8* 28.9* 29.4*  PLT PLATELET  CLUMPS NOTED ON SMEAR, COUNT APPEARS ADEQUATE 41* 66*   Coag's No results for input(s): APTT, INR in the last 168 hours. BMET  Recent Labs Lab 02/17/15 0457 02/17/15 1951 02/18/15 0500 02/19/15 0434  NA 137  --  138 135  K 5.1  --  4.9 5.1  CL 101  --  99* 98*  CO2 27  --  29 29  BUN 39*  --  40* 40*  CREATININE 1.85*  --  2.04* 2.33*  GLUCOSE 74 83 84 100*   Electrolytes  Recent Labs Lab 02/17/15 0457 02/18/15 0500 02/19/15 0434  CALCIUM 8.5* 8.8* 8.9   Sepsis Markers No results for input(s): LATICACIDVEN, PROCALCITON, O2SATVEN in the last 168 hours. ABG No results for input(s): PHART, PCO2ART, PO2ART in the last 168 hours. Liver Enzymes No results for input(s): AST, ALT, ALKPHOS, BILITOT, ALBUMIN in the last 168 hours. Cardiac Enzymes No results for input(s): TROPONINI, PROBNP in the last 168 hours. Glucose  Recent Labs Lab 02/19/15 0005 02/19/15 0405 02/19/15 0801 02/19/15 1002 02/19/15 1201 02/19/15 1609  GLUCAP 107* 103* 79 74 89 75    Imaging Dg Chest 1 View  02/18/2015   CLINICAL DATA:  81 year old female with history of congestive heart failure. Shortness of breath. Generalized weakness. History of heart valve replacement in April 2015.  EXAM: CHEST  1 VIEW  COMPARISON:  Chest  x-ray 02/06/2015.  FINDINGS: Worsening opacities throughout the right mid to lower lung. Partially loculated right-sided pleural effusion increasing, moderate to large in size. Opacity at the left lung base again noted. Moderate left pleural effusion unchanged. No evidence of pulmonary edema. Cardiac silhouette is largely obscured, but heart size appears mildly enlarged. Upper mediastinal contours are distorted by patient's positioning. Atherosclerosis in the thoracic aorta. Status post hemi median sternotomy. Markers in place in the expected location of the aortic annulus suggesting prior aortic valve replacement.  IMPRESSION: 1. Increasing moderate to large partially loculated  right-sided pleural effusion with worsening areas of atelectasis and/or consolidation throughout the right mid to lower lung. 2. Persistent atelectasis and/or consolidation in the left lower lobe with superimposed moderate left pleural effusion. 3. Mild cardiomegaly. 4. Atherosclerosis. 5. Postoperative changes, as above.   Electronically Signed   By: Trudie Reed M.D.   On: 02/18/2015 16:01   EKG: Reviewed, very poor tracing with Afib CXR: Reviewed.   Bilateral R>L effusions with worsening right sided effusion  ASSESSMENT / PLAN:  Principal Problem:   Septic shock Active Problems:   Chronic a-fib   S/P AVR-(tissue) 2011 at [facility] - no CABG with this   HTN- (transient hypotension during recent admission)   Obesity- BMI 45   Acute on chronic diastolic heart failure   DM (diabetes mellitus)   CKD (chronic kidney disease), stage III   Altered mental status   Urinary tract infectious disease   Thrombocytopenia   Hypoglycemia   Hypothermia   Acute on chronic renal insufficiency   Thyroid activity decreased   PULMONARY A: Acute hypoxic respiratory failure:   Likely 2/2 Acute on chronic HF with possible pneumonia.  P:   Supplemental O2 to maintain Sats >= 92% DNI  CARDIOVASCULAR A: Severe Sepsis: /Shock >weaned off pressor 5/29 , cortisol was low , if back on pressors add stress dose steroids , lactate bump to 2.2 Acute on chronic HF(EF 40-45%): mild bump in troponin ? Demand  AFib: Mechanical AV: Hypotension - cortisol 11.4 P:   KVO IVF Hold BB blockers Hold diuretics Hold Eliquis  for now as INR supratherapeutic  Start supplemental steroids - hydrocortisone 50 mg IV q6 ordered.  RENAL A: Acute on Chronic KD: Almost certainly 2/2 sepsis.  Hyperkalemia: Kayexalate 5/28 >K+tr down  Hypocalemia: P:   Serial BMPs May need to repeat Kayelxalate as K is rising currently 5.1. Balance between fluid overload and hypotension.  KVO IVF for now.  GASTROINTESTINAL A: GERD:   abd pain >5/29 KUB unremarkable  Ammonia rising due to hepatic congestion from heart failure P:   Cont PPI: NPO, unless comfort is started. Lactulose as ordered.  HEMATOLOGIC A: Leukopenia: Chronic issue Thrombocytopenia: Chronic issue Coagulaopathy -on eliquis , INR elevated 2.65 (nml LFT ) > tr up 3.19 P:   Serial CBC  Hold Eliquis  Recheck INR SCD   INFECTIOUS A: Possible Pneumonia-HCAP ,  UTI:  5/28 UC  P:   On zosyn Follow cultures  ENDOCRINE A: DMII:  Hypothyroidism: TSH remains high ~17 >levothyroxine increased 5/29 Low cortisol P:   SSI Cont levothyroixine per NGT  Supplemental steroids  NEUROLOGIC A: AMS:  Delirium due to sepsis and rising ammonia CT head 5/28 Neg for acute  P:   Lactulose Hold all sedation unless comfort Hold neurontin and requip until delirium improves   Avoid sedating rx   I spoke with the patient's daughter extensively.  Explained all aspects of the case.  The present daughter  is clear that she does not wish for pressors and would like to continue current code status.  I recommended that since we are recognizing that the patient is in the process is dying that we begin morphine.  She has 4 other siblings throughout the country and would like to confer with them first.  Would recommend patient be kept in 6N, hold bear hugger, will likely drop BP more, start supplemental steroids (discussed with RN and charge nurse on the unit) until decision is made regarding comfort.  If will start comfort then keep in the current unit.  If still wish for some treatment then may transfer.  PCCM will sign off.  The patient is critically ill with multiple organ systems failure and requires high complexity decision making for assessment and support, frequent evaluation and titration of therapies, application of advanced monitoring technologies and extensive interpretation of multiple databases.   Critical Care Time devoted to patient care services  described in this note is  35  Minutes. This time reflects time of care of this signee Dr Koren Bound. This critical care time does not reflect procedure time, or teaching time or supervisory time of PA/NP/Med student/Med Resident etc but could involve care discussion time.  Alyson Reedy, M.D. [facility] Pulmonary/Critical Care Medicine. Pager: [phone]. After hours pager: [phone].

## 2015-02-20 ENCOUNTER — Encounter: Payer: Self-pay | Admitting: Internal Medicine

## 2015-02-20 ENCOUNTER — Telehealth: Payer: Self-pay | Admitting: Hematology and Oncology

## 2015-02-20 LAB — CULTURE, BLOOD (ROUTINE X 2)
Culture: NO GROWTH
Culture: NO GROWTH

## 2015-02-20 LAB — CBC
HCT: 29.5 % — ABNORMAL LOW (ref 36.0–46.0)
Hemoglobin: 9 g/dL — ABNORMAL LOW (ref 12.0–15.0)
MCH: 31.4 pg (ref 26.0–34.0)
MCHC: 30.5 g/dL (ref 30.0–36.0)
MCV: 102.8 fL — AB (ref 78.0–100.0)
PLATELETS: 59 10*3/uL — AB (ref 150–400)
RBC: 2.87 MIL/uL — AB (ref 3.87–5.11)
RDW: 19.1 % — AB (ref 11.5–15.5)
WBC: 6.1 10*3/uL (ref 4.0–10.5)

## 2015-02-20 LAB — BASIC METABOLIC PANEL
ANION GAP: 10 (ref 5–15)
BUN: 44 mg/dL — AB (ref 6–20)
CHLORIDE: 101 mmol/L (ref 101–111)
CO2: 26 mmol/L (ref 22–32)
Calcium: 9 mg/dL (ref 8.9–10.3)
Creatinine, Ser: 2.63 mg/dL — ABNORMAL HIGH (ref 0.44–1.00)
GFR calc Af Amer: 19 mL/min — ABNORMAL LOW (ref >60)
GFR calc non Af Amer: 16 mL/min — ABNORMAL LOW (ref >60)
Glucose, Bld: 83 mg/dL (ref 65–99)
Potassium: 6.4 mmol/L (ref 3.5–5.1)
SODIUM: 137 mmol/L (ref 135–145)

## 2015-02-20 LAB — GLUCOSE, CAPILLARY
GLUCOSE-CAPILLARY: 75 mg/dL (ref 65–99)
GLUCOSE-CAPILLARY: 77 mg/dL (ref 65–99)
GLUCOSE-CAPILLARY: 81 mg/dL (ref 65–99)
Glucose-Capillary: 94 mg/dL (ref 65–99)

## 2015-02-20 MED ORDER — SODIUM CHLORIDE 0.9 % IV SOLN
0.5000 mg/h | INTRAVENOUS | Status: DC
  Administered 2015-02-20: 0.5 mg/h via INTRAVENOUS
  Filled 2015-02-20: qty 2.5

## 2015-02-20 MED ORDER — LACTULOSE ENEMA: 300.0000 mL | Freq: Every day | ORAL | Status: DC

## 2015-02-20 MED ORDER — HYDROMORPHONE HCL 1 MG/ML IJ SOLN: 1.0000 mg | INTRAMUSCULAR | Status: DC | PRN

## 2015-02-20 MED ORDER — HYDROMORPHONE HCL 2 MG/ML IJ SOLN: 1.0000 mg | INTRAMUSCULAR | Status: DC | PRN

## 2015-02-20 MED ORDER — HYDROMORPHONE HCL 1 MG/ML IJ SOLN
1.0000 mg | INTRAMUSCULAR | Status: DC | PRN
  Administered 2015-02-20: 1 mg via INTRAVENOUS
  Filled 2015-02-20: qty 1

## 2015-02-20 MED ORDER — SODIUM POLYSTYRENE SULFONATE 15 GM/60ML PO SUSP
30.0000 g | Freq: Once | ORAL | Status: DC
  Filled 2015-02-20 (×2): qty 120

## 2015-02-20 MED ORDER — LACTULOSE 10 GM/15ML PO SOLN
30.0000 g | Freq: Three times a day (TID) | ORAL | Status: DC
  Filled 2015-02-20 (×3): qty 45

## 2015-02-20 MED ORDER — SODIUM POLYSTYRENE SULFONATE 15 GM/60ML PO SUSP
15.0000 g | Freq: Once | ORAL | Status: DC
  Filled 2015-02-20: qty 60

## 2015-02-20 MED ORDER — CETYLPYRIDINIUM CHLORIDE 0.05 % MT LIQD
7.0000 mL | Freq: Two times a day (BID) | OROMUCOSAL | Status: DC
  Administered 2015-02-20 – 2015-02-21 (×3): 7 mL via OROMUCOSAL

## 2015-02-20 MED ORDER — INSULIN ASPART 100 UNIT/ML IV SOLN
10.0000 [IU] | Freq: Once | INTRAVENOUS | Status: AC
  Administered 2015-02-20: 10 [IU] via INTRAVENOUS

## 2015-02-20 MED ORDER — LACTULOSE ENEMA
300.0000 mL | Freq: Two times a day (BID) | ORAL | Status: DC
  Filled 2015-02-20 (×3): qty 300

## 2015-02-20 MED ORDER — DEXTROSE 50 % IV SOLN
1.0000 | Freq: Once | INTRAVENOUS | Status: AC
  Administered 2015-02-20: 50 mL via INTRAVENOUS

## 2015-02-20 MED ORDER — SODIUM CHLORIDE 0.9 % IV SOLN
1.0000 g | Freq: Once | INTRAVENOUS | Status: AC
  Administered 2015-02-20: 1 g via INTRAVENOUS
  Filled 2015-02-20: qty 10

## 2015-02-20 MED ORDER — FENTANYL CITRATE (PF) 100 MCG/2ML IJ SOLN: 12.5000 ug | INTRAMUSCULAR | Status: DC | PRN

## 2015-02-20 NOTE — Telephone Encounter (Signed)
Patient is r/s to 7/11 @ 10:45 w/Dr. Bertis Ruddy will mail calendar.

## 2015-02-20 NOTE — Progress Notes (Signed)
I met with family again. They agree with comfort care approach. No Lactulose, no IV antibiotics, no more lab drawn. Palliative to see also for symptoms management.

## 2015-02-20 NOTE — Progress Notes (Signed)
Nutrition Consult/Brief Note  Initial nutrition assessment completed 6/7. Patient now transitioning to comfort care.  No further nutrition interventions warranted at this time.  Please re-consult as needed.   Maureen Chatters, RD, LDN Pager #: [phone] After-Hours Pager #: [phone]

## 2015-02-20 NOTE — Progress Notes (Addendum)
Called [facility] overnight coverage to clarify order to hold Bair Hugger while BP is low.  PCCM MD said to hold [facility] due to risk of BP drop while temp > 92.  Informed [facility] coverage of the clarification.  BP 90's/45's-50's.  [employer]. Temp 94.9.  Will continue to monitor.

## 2015-02-20 NOTE — Progress Notes (Signed)
CRITICAL VALUE ALERT  Critical value received:  K+ 6.4  Date of notification:  02/20/2015  Time of notification:  0415  Critical value read back:Yes.    Nurse who received alert:  Kristeen Miss RN  MD notified (1st page):  Schorr  Time of first page:  [phone]  MD notified (2nd page):  Time of second page:  Responding MD:    Time MD responded:  [id number]

## 2015-02-20 NOTE — Progress Notes (Signed)
[facility] Progress Note   Melanie Camacho UUV:[id number] DOB: 1931-09-23 DOA: 2015/02/12 PCP: PROVIDER NOT IN SYSTEM  Brief narrative: Melanie Camacho is a 81 y.o. female with CHF (EF 40-45%), PAF on Eliquis, CKD 3, possible small vessel CAD (clean cath 4/22), DMII who presented to [facility] as a transfer from a rehab center for acute delirium due to a UTI. Found to have septic shock and admitted to ICU 5-28 . Started on pressors and stress dose steroids.  The patient was hospitalized from 4/28 through 5/24 acute on chronic diastolic heart failure as well.   After being transferred out of the ICU, she was still requiring oxygen with a CXR and exam consistent with pulm edema. She received IV Lasix and she was eventually able to be weaned off of O2. At the same time her platelets were noted to be be steadily dropping which was determined to be secondary to antibiotics given earlier in the admission. The platelets count has increased.  Patient with persistent hypothermia, hypoglycemia. Cortisol was normal, pan-culture repeated. Dr Lucianne Muss, endocrinology recommend changing synthroid to IV. Patient presentation is probably secondary to multi organ failure. Her renal function is getting worse. Lasix has been on hold.  Patient has been more lethargic. Ammonia level has increased. She has been getting Lactulose.  On 6-12, patient was notice to be more lethargic, persistent hypotension. CCM was re-consulted. Family has decided against IV pressors. She was started on IV hydrocortisone and Zosyn. Palliative care consulted for goals of care. Patient with poor prognosis.   Subjective: Patient is lethargic, no responsive.  Just had BM.   Assessment/Plan: Hypothermia; Hypotension -multifactorial secondary to HF, vs sepsis, vs adrenal insufficiency.  -holder parameter for bisoprolol/  - cause uncertain-  correct hypothyroidism. Might be related to hypoglycemia.  - TSH elevated. Synthroid increase  to 75  mcg -Cortisol normal. Blood culture no growth to date.  - Dr Lucianne Muss endocrinologist saw patient in consultation.  -Urine culture growing yeast. .  -Synthroid IV.  -Continue with IV zosyn, IV hydrocortisone.   Hypoglycemia: multifactorial secondary to multiorgan system failure.  Continue to check CBG insulin level , C peptide, and sulfonylurea level pending,.   Encephalopathy;  -elevated ammonia, hypoglycemia.  -Increase lactulose. -repeat Ammonia level and LFT.   Abdominal pain:  Complaining of cramping abdominal pain.  Abdominal pain better with PRN  simethicone. Suspect is from lactulose.   Thrombocytopenia - Platelet were noted to be steadily dropping during the hospital stay -Discussed with hematology-review of medications reveals that she was given Zyvox and cefepime on 5/28 both of which can cause this. -  Eliquis has been on hold- per hematology, Eliquis can be resumed once platelets found to be improving and greater than 50. Awaiting palliative care meeting.    Septic shock/UTI -Urine culture reveals Escherichia coli. - -Completed 7 days course.   Acute respiratory failure-acute on chronic systolic heart failure -EF 40-45% -Weight: 104--101-----96---98--99--98 -Appreciate Cardiology help.  -Chest x ray with increase pleural effusion loculated. Pulmonary evaluated patient 6-12.  -Daughter agree with fentanyl IV PRN for increase work of breathing.    AKI on  CKD (chronic kidney disease), stage III -Multifactorial secondary to HF, Diuretics, Sepsis.  -Baseline creatinine is about 1.2--1.5 -Cr at 2.6. Continue to hold lasix.  -Hyperkalemia: received kayexalate, Calcium gluconate.   Mildly elevated troponin -On 5/28 troponin was noted to be 0.04>0.05 > 0.04> 0.06 -Likely secondary to acute infection/septic shock-no further management  PAF - Holding Eliquis secondary to thrombocytopenia -bisoprolol  with holder parameters.   Recent shingles -left upper  back-completed treatment with valacyclovir  Diabetes mellitus -Diet controlled-follow CBGs -insulin level, c peptide normal -Multifactorial, multiple organ system failure. Marland Kitchen Appreciate Dr Lucianne Muss evaluation.   Hypothyroidism -TSH markedly elevated at 29. T 4 at 1.1. -synthroid  IV   S/P AVR-(tissue) 2011 at [facility] - no CABG with this  Obesity- BMI 45   Code Status: DO NOT RESUSCITATE Family Communication: care discussed with daughter 6-13. Palliative care consulted for goals of care. Daughter who is RN, would like to treat hyperkalemia for now, and continue lactulose, pending conversation with palliative care team.   Disposition Plan: to be determine.  DVT prophylaxis: holding eliquis due to thrombocytopenia.  Consultants: Pulmonary critical care Procedures: Echo 5/17 Left ventricle: The cavity size was normal. Systolic function was mildly to moderately reduced. The estimated ejection fraction was in the range of 40% to 45%. Regional wall motion abnormalities cannot be excluded. - Aortic valve: There was mild regurgitation. - Mitral valve: Calcified annulus. Mildly thickened leaflets . There was mild regurgitation. - Right ventricle: The cavity size was mildly dilated. Systolic function was mildly reduced. - Right atrium: The atrium was mildly dilated. - Pulmonary arteries: PA peak pressure: 32 mm Hg (S). - Pericardium, extracardiac: A trivial pericardial effusion was identified.   Antibiotics: Anti-infectives    Start     Dose/Rate Route Frequency Ordered Stop   02/19/15 1800  piperacillin-tazobactam (ZOSYN) IVPB 2.25 g     2.25 g 100 mL/hr over 30 Minutes Intravenous 4 times per day 02/19/15 1644     02/10/15 2000  cephALEXin (KEFLEX) capsule 500 mg     500 mg Oral Every 8 hours 02/10/15 1212 02/10/15 2050   02/08/15 2000  cephALEXin (KEFLEX) capsule 500 mg  Status:  Discontinued     500 mg Oral Every 8 hours 02/08/15 1715 02/10/15 1212   02/05/15 1800   azithromycin (ZITHROMAX) 250 mg in dextrose 5 % 125 mL IVPB  Status:  Discontinued     250 mg 125 mL/hr over 60 Minutes Intravenous Every 24 hours 01/22/2015 2022 02/07/15 1148   02/02/2015 2200  linezolid (ZYVOX) IVPB 600 mg  Status:  Discontinued     600 mg 300 mL/hr over 60 Minutes Intravenous Every 12 hours 01/17/2015 2022 02/06/15 1224   01/10/2015 2200  ceFEPIme (MAXIPIME) 2 g in dextrose 5 % 50 mL IVPB  Status:  Discontinued     2 g 100 mL/hr over 30 Minutes Intravenous Every 24 hours 02/02/2015 2057 02/08/15 1715   01/17/2015 1630  linezolid (ZYVOX) IVPB 600 mg  Status:  Discontinued     600 mg 300 mL/hr over 60 Minutes Intravenous  Once 01/25/2015 1620 01/16/2015 2023   01/29/2015 1630  levofloxacin (LEVAQUIN) IVPB 500 mg     500 mg 100 mL/hr over 60 Minutes Intravenous  Once 01/15/2015 1620 01/20/2015 1810   01/18/2015 1500  cefTRIAXone (ROCEPHIN) 1 g in dextrose 5 % 50 mL IVPB     1 g 100 mL/hr over 30 Minutes Intravenous  Once 02/06/2015 1446 02/02/2015 1614      Objective: Filed Weights   02/19/15 0631 02/19/15 1741 02/20/15 0458  Weight: 97.6 kg (215 lb 2.7 oz) 97.4 kg (214 lb 11.7 oz) 98.9 kg (218 lb 0.6 oz)    Intake/Output Summary (Last 24 hours) at 02/20/15 0813 Last data filed at 02/20/15 0600  Gross per 24 hour  Intake    430 ml  Output  0 ml  Net    430 ml     Vitals Filed Vitals:   02/20/15 0347 02/20/15 0458 02/20/15 0700 02/20/15 0800  BP: 81/58  93/47 95/54  Pulse: 82  58 37  Temp: 94.9 F (34.9 C)     TempSrc: Rectal     Resp: 23  17 19   Height:      Weight:  98.9 kg (218 lb 0.6 oz)    SpO2: 99%  99% 99%    Exam:  General: lethargic, no responsive.   Cardiovascular: Irregular rate and rhythm, S1/S2 No murmur  Respiratory: bilateral air movement, mild tachypnea. Crackles bases.   Abdomen: Soft, +Bowel sounds,  tender, non distended, no guarding + abdominal wall edema  MSK: bilateral LE edema.   Data Reviewed: Basic Metabolic Panel:  Recent Labs Lab  02/16/15 0400 02/17/15 0457 02/17/15 1951 02/18/15 0500 02/19/15 0434 02/20/15 0250  NA 138 137  --  138 135 137  K 4.3 5.1  --  4.9 5.1 6.4*  CL 100* 101  --  99* 98* 101  CO2 28 27  --  29 29 26   GLUCOSE 64* 74 83 84 100* 83  BUN 36* 39*  --  40* 40* 44*  CREATININE 1.52* 1.85*  --  2.04* 2.33* 2.63*  CALCIUM 8.7* 8.5*  --  8.8* 8.9 9.0   Liver Function Tests: No results for input(s): AST, ALT, ALKPHOS, BILITOT, PROT, ALBUMIN in the last 168 hours. No results for input(s): LIPASE, AMYLASE in the last 168 hours.  Recent Labs Lab 02/17/15 1848 02/18/15 1253 02/19/15 1252  AMMONIA 71* 60* 138*   CBC:  Recent Labs Lab 02/14/15 0453  02/15/15 0557 02/16/15 0400 02/18/15 0500 02/19/15 0434 02/20/15 0250  WBC 4.1  < > 4.1 4.1 4.2 7.2 6.1  NEUTROABS 2.1  --  1.9  --   --   --   --   HGB 9.6*  < > 9.5* 9.3* 9.0* 9.0* 9.0*  HCT 30.4*  < > 30.0* 29.8* 28.9* 29.4* 29.5*  MCV 101.0*  < > 102.7* 102.1* 104.0* 104.6* 102.8*  PLT 42*  < > 43* PLATELET CLUMPS NOTED ON SMEAR, COUNT APPEARS ADEQUATE 41* 66* 59*  < > = values in this interval not displayed. Cardiac Enzymes: No results for input(s): CKTOTAL, CKMB, CKMBINDEX, TROPONINI in the last 168 hours. BNP (last 3 results)  Recent Labs  02/01/2015 1318 02/08/15 0524 02/10/15 0534  BNP 840.0* 1285.4* 1210.7*    ProBNP (last 3 results) No results for input(s): PROBNP in the last 8760 hours.  CBG:  Recent Labs Lab 02/19/15 1609 02/19/15 2007 02/20/15 0012 02/20/15 0353 02/20/15 0607  GLUCAP 75 79 81 77 94    Recent Results (from the past 240 hour(s))  Culture, blood (routine x 2)     Status: None (Preliminary result)   Collection Time: 02/14/15  8:40 AM  Result Value Ref Range Status   Specimen Description BLOOD LEFT ARM  Final   Special Requests BOTTLES DRAWN AEROBIC ONLY 2CC  Final   Culture   Final           BLOOD CULTURE RECEIVED NO GROWTH TO DATE CULTURE WILL BE HELD FOR 5 DAYS BEFORE ISSUING A FINAL  NEGATIVE REPORT Performed at [facility]    Report Status PENDING  Incomplete  Culture, blood (routine x 2)     Status: None (Preliminary result)   Collection Time: 02/14/15  8:45 AM  Result Value Ref Range Status  Specimen Description BLOOD RIGHT ARM  Final   Special Requests BOTTLES DRAWN AEROBIC ONLY 3CC  Final   Culture   Final           BLOOD CULTURE RECEIVED NO GROWTH TO DATE CULTURE WILL BE HELD FOR 5 DAYS BEFORE ISSUING A FINAL NEGATIVE REPORT Performed at [facility]    Report Status PENDING  Incomplete  Urine culture     Status: None   Collection Time: 02/17/15 10:58 AM  Result Value Ref Range Status   Specimen Description URINE, RANDOM  Final   Special Requests NONE  Final   Colony Count   Final    75,000 COLONIES/ML Performed at [facility]    Culture YEAST Performed at [facility]   Final   Report Status 02/18/2015 FINAL  Final     Studies: Dg Chest 1 View  02/18/2015   CLINICAL DATA:  81 year old female with history of congestive heart failure. Shortness of breath. Generalized weakness. History of heart valve replacement in April 2015.  EXAM: CHEST  1 VIEW  COMPARISON:  Chest x-ray 02/06/2015.  FINDINGS: Worsening opacities throughout the right mid to lower lung. Partially loculated right-sided pleural effusion increasing, moderate to large in size. Opacity at the left lung base again noted. Moderate left pleural effusion unchanged. No evidence of pulmonary edema. Cardiac silhouette is largely obscured, but heart size appears mildly enlarged. Upper mediastinal contours are distorted by patient's positioning. Atherosclerosis in the thoracic aorta. Status post hemi median sternotomy. Markers in place in the expected location of the aortic annulus suggesting prior aortic valve replacement.  IMPRESSION: 1. Increasing moderate to large partially loculated right-sided pleural effusion with worsening areas of atelectasis and/or consolidation  throughout the right mid to lower lung. 2. Persistent atelectasis and/or consolidation in the left lower lobe with superimposed moderate left pleural effusion. 3. Mild cardiomegaly. 4. Atherosclerosis. 5. Postoperative changes, as above.   Electronically Signed   By: Trudie Reed M.D.   On: 02/18/2015 16:01    Scheduled Meds:  Scheduled Meds: . antiseptic oral rinse  7 mL Mouth Rinse BID  . cholecalciferol  5,000 Units Oral Daily  . colesevelam  1,875 mg Oral BID WC  . dextrose  25 mL Intravenous Once  . hydrocortisone sodium succinate  50 mg Intravenous Q6H  . lactulose  10 g Oral Once  . lactulose  20 g Oral TID  . levothyroxine  50 mcg Intravenous Daily  . multivitamin  1 tablet Oral Daily  . nystatin   Topical BID  . pantoprazole  40 mg Oral QHS  . piperacillin-tazobactam (ZOSYN)  IV  2.25 g Intravenous 4 times per day  . sodium polystyrene  30 g Oral Once   Continuous Infusions:   Time spent on care of this patient: 35 min   Regalado, Prentiss Bells, MD [phone] 02/20/2015, 8:13 AM  LOS: 16 days   [facility] Office  [phone] Pager - Text Page per [email or web address] If 7PM-7AM, please contact night-coverage [email or web address]

## 2015-02-20 NOTE — Progress Notes (Signed)
Dear Doctor: This patient has been identified as a candidate for PICC for the following reason (s): poor veins/poor circulatory system (CHF, COPD, emphysema, diabetes, steroid use, IV drug abuse, etc.) If you agree, please write an order for the indicated device. IR needs to place this pt's PICC line if ordered IV team can not place PICC's on her. Consuello Masse

## 2015-02-20 NOTE — Consult Note (Signed)
ASked to see Melanie Camacho by Dr. Sunnie Nielsen for palliative consult for symptom management/comfort care  Impression: 1. Refractory viventricular heart failure 2. MOSF secondary to the above  Recommendations: 1. D/c hydrocortisone 2. Hydromorphone infusion for comfort. D/C fentanyl  Discussed with two daughters. They understand that comfort infusion of hydropmorphone may affect respirations. They are also prepared for possible transfer to a regular floor.  HPI Melanie Camacho is a 81 y.o. female with CHF (EF 40-45%), PAF on Eliquis, CKD 3, possible small vessel CAD (clean cath 4/22), DMII who presented to [facility] as a transfer from a rehab center for acute delirium due to a UTI. Found to have septic shock and admitted to ICU 5-28 . Started on pressors and stress dose steroids.  The patient was hospitalized from 4/28 through 5/24 acute on chronic diastolic heart failure as well.   After being transferred out of the ICU, she was still requiring oxygen with a CXR and exam consistent with pulm edema. She received IV Lasix and she was eventually able to be weaned off of O2. At the same time her platelets were noted to be be steadily dropping which was determined to be secondary to antibiotics given earlier in the admission. The platelets count has increased.  Patient with persistent hypothermia, hypoglycemia. Cortisol was normal, pan-culture repeated. Dr Lucianne Muss, endocrinology recommend changing synthroid to IV. Patient presentation is probably secondary to multi organ failure. Her renal function is getting worse. Lasix has been on hold.  Patient has been more lethargic. Ammonia level has increased. She has been getting Lactulose.  On 6-12, patient was notice to be more lethargic, persistent hypotension. CCM was re-consulted. Family has decided against IV pressors and for comfort care only. She was started on IV hydrocortisone and Zosyn. Palliative care consulted for goals of care. Patient with poor prognosis.     Scheduled Meds:  antiseptic oral rinse  7 mL Mouth Rinse BID   dextrose  25 mL Intravenous Once   hydrocortisone sodium succinate  50 mg Intravenous Q6H   Continuous Infusions:  PRN Meds:.sodium chloride, acetaminophen, fentaNYL (SUBLIMAZE) injection, HYDROmorphone (DILAUDID) injection   Past Medical History  Diagnosis Date   Anginal pain    Hypertension    Heart murmur    CHF (congestive heart failure)    Shortness of breath    Complication of anesthesia     "I had a slight reaction to the morphine given in surgery"   High cholesterol    Coronary artery disease    Myocardial infarction 12/2013   Pneumonia "once or twice"   Positive TB test     "had to take RX once"   Type II diabetes mellitus    Anemia    History of blood transfusion     "related to the anemia"   GERD (gastroesophageal reflux disease)    Arthritis     "all over"   Chronic lower back pain    Past Surgical History  Procedure Laterality Date   Left heart catheterization with coronary angiogram N/A 12/28/2013    Procedure: LEFT HEART CATHETERIZATION WITH CORONARY ANGIOGRAM;  Surgeon: Lennette Bihari, MD;  Location: [facility] CATH LAB;  Service: Cardiovascular;  Laterality: N/A;   Cardiac catheterization     Cholecystectomy open  1980's   Appendectomy  ~ 1948   Tonsillectomy  1954   Hernia repair     Abdominal hernia repair  "many times"   Hemorrhoid banding     Fracture surgery     Foot  fracture surgery Right ~ 1980   Cataract extraction Right     History   Social History   Marital Status: Widowed    Spouse Name: N/A   Number of Children: 5   Years of Education: HSG   Social History Main Topics   Smoking status: Never Smoker    Smokeless tobacco: Never Used   Alcohol Use: No   Drug Use: No   Sexual Activity: No   Other Topics Concern   None   Social History Narrative  Melanie Camacho raised 5 children: 3 girls, 2 boys. She has 13 grandchildren and 12  great-grands. 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  Palliative Care Status- DNR, comfort care only. For continuous infusion hyromorphone to relieve persistent abdominal pain.  Palliative Prophylaxis - has had loose stools secondary to lactulose.  PE:  Filed Vitals:   02/20/15 0843  BP: 108/66  Pulse: 81  Temp: 94.8 F (34.9 C)  Resp: 12   General: obese, elderly white woman in no distress who is very somnolent (just given 1 mg dilaudid) HEENT- eyes are closed Cor - IRIR rate controlled Pul - no increased WOB, slow respiratory rate, no rales on anterior auscultation Abd - obese, BS +, no guarding Neuro - very somnolent   CBC Latest Ref Rng 02/20/2015 02/19/2015 02/18/2015  WBC 4.0 - 10.5 K/uL 6.1 7.2 4.2  Hemoglobin 12.0 - 15.0 g/dL 9.0(L) 9.0(L) 9.0(L)  Hematocrit 36.0 - 46.0 % 29.5(L) 29.4(L) 28.9(L)  Platelets 150 - 400 K/uL 59(L) 66(L) 41(L)    CMP Latest Ref Rng 02/20/2015 02/19/2015 02/18/2015  Glucose 65 - 99 mg/dL 83 161(W) 84  BUN 6 - 20 mg/dL 96(E) 45(W) 09(W)  Creatinine 0.44 - 1.00 mg/dL 1.19(J) 4.78(G) 9.56(O)  Sodium 135 - 145 mmol/L 137 135 138  Potassium 3.5 - 5.1 mmol/L 6.4(HH) 5.1 4.9  Chloride 101 - 111 mmol/L 101 98(L) 99(L)  CO2 22 - 32 mmol/L Calcium 8.9 - 10.3 mg/dL 9.0 8.9 1.3(Y)  Total Protein 6.5 - 8.1 g/dL - - -  Total Bilirubin 0.3 - 1.2 mg/dL - - -  Alkaline Phos 38 - 126 U/L - - -  AST 15 - 41 U/L - - -  ALT 14 - 54 U/L - - -   NH3  138   Imaging CXR 02/18/15: IMPRESSION: 1. Increasing moderate to large partially loculated right-sided pleural effusion with worsening areas of atelectasis and/or consolidation throughout the right mid to lower lung. 2. Persistent atelectasis and/or consolidation in the left lower lobe with superimposed moderate left pleural effusion. 3. Mild cardiomegaly. 4. Atherosclerosis. 5. Postoperative changes, as above.  Discussed patient's condition with the family present.  Answered all questions about her condition and care. They are in agreement for comfort care only, including hydromorphone infusion. They understand that her prognosis is terminal in the near future. They have declined chaplain consult.   Greater than 50% of encounter time spent on education and counseling  Time in 1338 Time out 1430  Thank you for this consult.  Illene Regulus, MD Palliative Care Team  [phone]

## 2015-02-20 NOTE — Progress Notes (Signed)
   Chart reviewed. Patient now transitioned to comfort care. I feel this is very appropriate.   We will sign off. Please call with questions.   Ramces Shomaker,MD 12:13 PM

## 2015-02-20 NOTE — Progress Notes (Signed)
Pt admitted to unit. Patient is comfort care, and family requests no assessments or vitals. Compassion cart ordered for family, and needs/expectations for the hours ahead were discussed. Family oriented to call light and room.

## 2015-02-21 DIAGNOSIS — E162 Hypoglycemia, unspecified: Secondary | ICD-10-CM

## 2015-02-21 DIAGNOSIS — T68XXXS Hypothermia, sequela: Secondary | ICD-10-CM

## 2015-02-21 LAB — PROINSULIN/INSULIN RATIO
INSULIN: 1.1 u[IU]/mL
PROINSULIN/INSULIN RATIO: 163 %
Proinsulin: 12 pmol/L

## 2015-02-24 NOTE — Discharge Summary (Signed)
Death Summary  Melanie Camacho:[id number] DOB: May 20, 1932 DOA: 17-Feb-2015  PCP: PROVIDER NOT IN SYSTEM  Admit date: 2015-02-17 Date of Death: 03/09/2015  Final Diagnoses:  Principal Problem:   Septic shock Active Problems:   Chronic a-fib   S/P AVR-(tissue) 2011 at [facility] - no CABG with this   HTN- (transient hypotension during recent admission)   Obesity- BMI 45   Acute on chronic diastolic heart failure   DM (diabetes mellitus)   CKD (chronic kidney disease), stage III   Altered mental status   Urinary tract infectious disease   Thrombocytopenia   Hypoglycemia   Hypothermia   Acute on chronic renal insufficiency   Thyroid activity decreased   Acute on chronic respiratory failure with hypoxemia   Time of Death 03-06-2023 at 20:18 I last evaluated the patient during the day. This is my last note  Brief narrative: Melanie Camacho is a 81 y.o. female with CHF (EF 40-45%), PAF on Eliquis, CKD 3, possible small vessel CAD (clean cath 4/22), DMII who presented to [facility] as a transfer from a rehab center for acute delirium due to a UTI. Found to have septic shock and admitted to ICU 17-Feb-2023 . Started on pressors and stress dose steroids.  The patient was hospitalized from 4/28 through 5/24 acute on chronic diastolic heart failure as well.   After being transferred out of the ICU, she was still requiring oxygen with a CXR and exam consistent with pulm edema. She received IV Lasix and she was eventually able to be weaned off of O2. At the same time her platelets were noted to be be steadily dropping which was determined to be secondary to antibiotics given earlier in the admission. Overall she continued to feel well and was symptom free. We were waiting for her platelets to rise above 50,000 in order to allow her to discharge home. At the same time we continued to diurese her to get her down to her baseline weight.  Patient was noted to become hypothermic on the night of 6/6. I assessed her the  following day. She was alert, oriented, mobile, eating well without complaints. Further work up ordered. TSH was elevated and I gave her an IV dose of Synthroid and increased her oral Synthroid. On the following day she developed hypoglycemia. Cortisol was normal- pan-culture repeated. Dr Lucianne Muss, endocrinology recommend changing synthroid to IV. Patient's decline as this time was deemed to be probably secondary to multi organ failure. Her renal function was getting worse despite holding Lasix. Patient became more lethargic, with an increased ammonia level-lactulose was given On 6-12, patient was notice to be more lethargic with persistent hypotension. CCM was re-consulted. Family decided against IV pressors. She was started on IV hydrocortisone and Zosyn. Palliative care consulted for goals of care and patient transition to comfort care on 6/13.  Subjective: Patient unresponsive  Assessment/Plan:  Currently comfort care only-on Dilaudid infusion  Hypothermia; Hypotension -multifactorial secondary to HF, vs sepsis, vs adrenal insufficiency.  - cause uncertain- correct hypothyroidism. Might be related to hypoglycemia.  - TSH elevated. Synthroid increase to 75 mcg -Cortisol normal. Blood culture no growth to date.  - Dr Lucianne Muss endocrinologist saw patient in consultation.  -Urine culture growing yeast. .    Hypoglycemia: multifactorial secondary to multiorgan system failure.   Encephalopathy;  -elevated ammonia, hypoglycemia.   Thrombocytopenia - Platelet were noted to be steadily dropping during the hospital stay -Discussed with hematology-review of medications reveals that she was given Zyvox and cefepime  on 5/28 both of which can cause this.  Septic shock/UTI -Urine culture revealed Escherichia coli. - -Completed 7 days course of antibiotics.   Acute respiratory failure-acute on chronic systolic heart failure -EF 40-45% -Weight: 104--101-----96---98--99--98 -Appreciate  Cardiology help.  -Chest x ray with increase pleural effusion loculated. Pulmonary evaluated patient 6-12.   AKI on CKD (chronic kidney disease), stage III -Multifactorial secondary to HF, Diuretics, Sepsis.   -Hyperkalemia: received kayexalate, Calcium gluconate.   Mildly elevated troponin -On 5/28 troponin was noted to be 0.04>0.05 > 0.04> 0.06 -Likely secondary to acute infection/septic shock-no further management  PAF -In sinus rhythm - Holding Eliquis secondary to thrombocytopenia   Recent shingles -left upper back-completed treatment with valacyclovir  Diabetes mellitus -Diet controlled-follow CBGs -insulin level, c peptide normal -Multifactorial, multiple organ system failure. Marland Kitchen Appreciate Dr Lucianne Muss evaluation.   Hypothyroidism -TSH markedly elevated at 29. T 4 at 1.1. -He was given synthroid IV   S/P AVR-(tissue) 2011 at [facility] - no CABG with this  Obesity- BMI 45   Code Status: DO NOT RESUSCITATE DVT prophylaxis: holding eliquis due to thrombocytopenia.  Consultants: Pulmonary critical care, palliative care Procedures: Echo 5/17 Left ventricle: The cavity size was normal. Systolic function was mildly to moderately reduced. The estimated ejection fraction was in the range of 40% to 45%. Regional wall motion abnormalities cannot be excluded. - Aortic valve: There was mild regurgitation. - Mitral valve: Calcified annulus. Mildly thickened leaflets . There was mild regurgitation. - Right ventricle: The cavity size was mildly dilated. Systolic function was mildly reduced. - Right atrium: The atrium was mildly dilated. - Pulmonary arteries: PA peak pressure: 32 mm Hg (S). - Pericardium, extracardiac: A trivial pericardial effusion was identified.   Antibiotics: Anti-infectives    Start   Dose/Rate Route Frequency Ordered Stop   02/19/15 1800  piperacillin-tazobactam (ZOSYN) IVPB 2.25 g Status: Discontinued    2.25 g 100  mL/hr over 30 Minutes Intravenous 4 times per day 02/19/15 1644 02/20/15 1046   02/10/15 2000  cephALEXin (KEFLEX) capsule 500 mg    500 mg Oral Every 8 hours 02/10/15 1212 02/10/15 2050   02/08/15 2000  cephALEXin (KEFLEX) capsule 500 mg Status: Discontinued    500 mg Oral Every 8 hours 02/08/15 1715 02/10/15 1212   02/05/15 1800  azithromycin (ZITHROMAX) 250 mg in dextrose 5 % 125 mL IVPB Status: Discontinued    250 mg 125 mL/hr over 60 Minutes Intravenous Every 24 hours 01/14/2015 2022 02/07/15 1148   01/29/2015 2200  linezolid (ZYVOX) IVPB 600 mg Status: Discontinued    600 mg 300 mL/hr over 60 Minutes Intravenous Every 12 hours 01/27/2015 2022 02/06/15 1224   01/08/2015 2200  ceFEPIme (MAXIPIME) 2 g in dextrose 5 % 50 mL IVPB Status: Discontinued    2 g 100 mL/hr over 30 Minutes Intravenous Every 24 hours 01/20/2015 2057 02/08/15 1715   01/13/2015 1630  linezolid (ZYVOX) IVPB 600 mg Status: Discontinued    600 mg 300 mL/hr over 60 Minutes Intravenous Once 01/09/2015 1620 01/11/2015 2023   01/15/2015 1630  levofloxacin (LEVAQUIN) IVPB 500 mg    500 mg 100 mL/hr over 60 Minutes Intravenous Once 02/01/2015 1620 01/19/2015 1810   01/08/2015 1500  cefTRIAXone (ROCEPHIN) 1 g in dextrose 5 % 50 mL IVPB    1 g 100 mL/hr over 30 Minutes Intravenous Once 01/12/2015 1446 01/31/2015 1614      Objective: Filed Weights   02/19/15 0631 02/19/15 1741 02/20/15 0458  Weight: 97.6 kg (215 lb 2.7  oz) 97.4 kg (214 lb 11.7 oz) 98.9 kg (218 lb 0.6 oz)    Intake/Output Summary (Last 24 hours) at March 06, 2015 1700 Last data filed at 03-06-2015 1403  Gross per 24 hour  Intake  0 ml  Output  0 ml  Net  0 ml     Vitals Filed Vitals:   02/20/15 0458 02/20/15 0700 02/20/15 0800 02/20/15 0843  BP:  93/47 95/54 108/66  Pulse:  58 37 81  Temp:    94.8 F (34.9 C)  TempSrc:     Rectal  Resp:  17 19 12   Height:      Weight: 98.9 kg (218 lb 0.6 oz)     SpO2:  99% 99% 99%    Exam:  General: Unresponsive  Cardiovascular: Irregular rate and rhythm, S1/S2 No murmur  Respiratory: bilateral air movement, mild tachypnea. Crackles bases.   Abdomen: Soft, +Bowel sounds, tender, non distended, no guarding + abdominal wall edema  MSK: bilateral LE edema.  Data Reviewed: Basic Metabolic Panel:  Last Labs      Recent Labs Lab 02/16/15 0400 02/17/15 0457 02/17/15 1951 02/18/15 0500 02/19/15 0434 02/20/15 0250  NA 138 137 --  138 135 137  K 4.3 5.1 --  4.9 5.1 6.4*  CL 100* 101 --  99* 98* 101  CO2 28 27 --  29 29 26   GLUCOSE 64* 74 83 84 100* 83  BUN 36* 39* --  40* 40* 44*  CREATININE 1.52* 1.85* --  2.04* 2.33* 2.63*  CALCIUM 8.7* 8.5* --  8.8* 8.9 9.0     Liver Function Tests:  Last Labs     No results for input(s): AST, ALT, ALKPHOS, BILITOT, PROT, ALBUMIN in the last 168 hours.    Last Labs     No results for input(s): LIPASE, AMYLASE in the last 168 hours.    Last Labs      Recent Labs Lab 02/17/15 1848 02/18/15 1253 02/19/15 1252  AMMONIA 71* 60* 138*     CBC:  Last Labs      Recent Labs Lab 02/15/15 0557 02/16/15 0400 02/18/15 0500 02/19/15 0434 02/20/15 0250  WBC 4.1 4.1 4.2 7.2 6.1  NEUTROABS 1.9 --  --  --  --   HGB 9.5* 9.3* 9.0* 9.0* 9.0*  HCT 30.0* 29.8* 28.9* 29.4* 29.5*  MCV 102.7* 102.1* 104.0* 104.6* 102.8*  PLT 43* PLATELET CLUMPS NOTED ON SMEAR, COUNT APPEARS ADEQUATE 41* 66* 59*     Cardiac Enzymes:  Last Labs     No results for input(s): CKTOTAL, CKMB, CKMBINDEX, TROPONINI in the last 168 hours.   BNP (last 3 results)  Recent Labs (within last 365 days)     Recent Labs  01/20/2015 1318 02/08/15 0524 02/10/15 0534  BNP 840.0* 1285.4* 1210.7*       ProBNP (last 3 results)  Recent Labs (within last 365 days)    No results for input(s): PROBNP in the last 8760 hours.    CBG:  Last Labs      Recent Labs Lab 02/19/15 2007 02/20/15 0012 02/20/15 0353 02/20/15 0607 02/20/15 0823  GLUCAP 79 81 77 94 75      Recent Results (from the past 240 hour(s))  Culture, blood (routine x 2) Status: None   Collection Time: 02/14/15 8:40 AM  Result Value Ref Range Status   Specimen Description BLOOD LEFT ARM  Final   Special Requests BOTTLES DRAWN AEROBIC ONLY 2CC  Final   Culture   Final  NO GROWTH 5 DAYS Performed at [facility]    Report Status 02/20/2015 FINAL  Final  Culture, blood (routine x 2) Status: None   Collection Time: 02/14/15 8:45 AM  Result Value Ref Range Status   Specimen Description BLOOD RIGHT ARM  Final   Special Requests BOTTLES DRAWN AEROBIC ONLY 3CC  Final   Culture   Final    NO GROWTH 5 DAYS Performed at [facility]    Report Status 02/20/2015 FINAL  Final  Urine culture Status: None   Collection Time: 02/17/15 10:58 AM  Result Value Ref Range Status   Specimen Description URINE, RANDOM  Final   Special Requests NONE  Final   Colony Count   Final    75,000 COLONIES/ML Performed at [facility]    Culture YEAST Performed at [facility]   Final   Report Status 02/18/2015 FINAL  Final     Studies:  Imaging Results (Last 48 hours)    No results found.    Scheduled Meds:  Scheduled Meds: . antiseptic oral rinse 7 mL Mouth Rinse BID  . dextrose 25 mL Intravenous Once   Continuous Infusions: . HYDROmorphone 0.5 mg/hr (02/20/15 1458)    Time spent on care of this patient: 35 min         Time: 30 min  Signed:  Smitty Ackerley  [facility] 02/24/2015, 3:57 PM

## 2015-02-27 ENCOUNTER — Ambulatory Visit: Payer: Medicare Other

## 2015-02-27 ENCOUNTER — Inpatient Hospital Stay: Payer: Medicare Other | Admitting: Hematology and Oncology

## 2015-02-27 ENCOUNTER — Other Ambulatory Visit: Payer: Medicare Other

## 2015-02-27 LAB — SULFONYLUREA HYPOGLYCEMICS PANEL, SERUM
Acetohexamide: NEGATIVE ug/mL (ref 20–60)
Chlorpropamide: NEGATIVE ug/mL (ref 75–250)
GLIMEPIRIDE: NEGATIVE ng/mL (ref 80–250)
Glipizide: NEGATIVE ng/mL (ref 200–1000)
Glyburide: NEGATIVE ng/mL
Nateglinide: NEGATIVE ng/mL
Repaglinide: NEGATIVE ng/mL
Tolazamide: NEGATIVE ug/mL
Tolbutamide: NEGATIVE ug/mL (ref 40–100)

## 2015-03-10 NOTE — Care Management Note (Signed)
Case Management Note  Patient Details  Name: MCKENZE NISSLEY MRN: [id number] Date of Birth: 1931/10/25  Subjective/Objective:     Patient transferred to med tele floor last pm, NCM will cont to follow               Action/Plan:   Expected Discharge Date:                  Expected Discharge Plan:  Home w Home Health Services  In-House Referral:     Discharge planning Services  CM Consult  Post Acute Care Choice:    Choice offered to:     DME Arranged:    DME Agency:     HH Arranged:    HH Agency:     Status of Service:  In process, will continue to follow  Medicare Important Message Given:  Yes Date Medicare IM Given:  02/07/15 Medicare IM give by:  Avie Arenas, RNBSN Date Additional Medicare IM Given:  03/07/2015 Additional Medicare Important Message give by:  Letha Cape RN  If discussed at Long Length of Stay Meetings, dates discussed:    Additional Comments:  Leone Haven, RN 02/20/2015, 12:09 PM

## 2015-03-10 NOTE — Progress Notes (Signed)
Interval - dilaudid  Infusion started yesterday. Patient moved from step down to floor. Per family she has been unresponsive but seems comfortable.  Exam: unresponsive             Respiratory rate of 6-8 with neck retractions  Terminal care - comfortable on diluadid infusion. She appears premorbid. Family feels she is comfortable and they accept her present condition.  Illene Regulus, MD, [facility] Palliative Care Team  [phone]

## 2015-03-10 NOTE — Progress Notes (Addendum)
[facility] Progress Note   TAJE TONDREAU RUE:[id number] DOB: 1931-11-24 DOA: 01/20/2015 PCP: PROVIDER NOT IN SYSTEM  Brief narrative: Melanie Camacho is a 81 y.o. female with CHF (EF 40-45%), PAF on Eliquis, CKD 3, possible small vessel CAD (clean cath 4/22), DMII who presented to [facility] as a transfer from a rehab center for acute delirium due to a UTI. Found to have septic shock and admitted to ICU 5-28 . Started on pressors and stress dose steroids.  The patient was hospitalized from 4/28 through 5/24 acute on chronic diastolic heart failure as well.   After being transferred out of the ICU, she was still requiring oxygen with a CXR and exam consistent with pulm edema. She received IV Lasix and she was eventually able to be weaned off of O2. At the same time her platelets were noted to be be steadily dropping which was determined to be secondary to antibiotics given earlier in the admission. Overall she continued to feel well and was symptom free. We were waiting for her platelets to rise above 50,000 in order to allow her to discharge home. At the same time we continued to diurese her to get her down to her baseline weight.  Patient was noted to become hypothermic on the night of 6/6. I assessed her the following day. She was alert, oriented, mobile, eating well without complaints. Further work up ordered. TSH was elevated and i gave her an IV dose of Synthroid and increased her oral Synthroid. On the following day she developed hypoglycemia. Cortisol was normal- pan-culture repeated. Dr Lucianne Muss, endocrinology recommend changing synthroid to IV. Patient's decline as this time was deemed to be probably secondary to multi organ failure. Her renal function was getting worse despite holding Lasix. Patient became more lethargic, with an increased ammonia level-lactulose was given On 6-12, patient was notice to be more lethargic, persistent hypotension. CCM was re-consulted. Family decided against IV  pressors. She was started on IV hydrocortisone and Zosyn. Palliative care consulted for goals of care and patient transition to comfort care on 6/13.  Subjective: Patient unresponsive  Assessment/Plan:  Currently comfort care only-on Dilaudid infusion  Hypothermia; Hypotension -multifactorial secondary to HF, vs sepsis, vs adrenal insufficiency.  - cause uncertain-  correct hypothyroidism. Might be related to hypoglycemia.  - TSH elevated. Synthroid increase  to 75 mcg -Cortisol normal. Blood culture no growth to date.  - Dr Lucianne Muss endocrinologist saw patient in consultation.  -Urine culture growing yeast. .   Hypoglycemia: multifactorial secondary to multiorgan system failure.   Encephalopathy;  -elevated ammonia, hypoglycemia.   Thrombocytopenia - Platelet were noted to be steadily dropping during the hospital stay -Discussed with hematology-review of medications reveals that she was given Zyvox and cefepime on 5/28 both of which can cause this.   Septic shock/UTI -Urine culture revealed Escherichia coli. - -Completed 7 days course of antibiotics.   Acute respiratory failure-acute on chronic systolic heart failure -EF 40-45% -Weight: 104--101-----96---98--99--98 -Appreciate Cardiology help.  -Chest x ray with increase pleural effusion loculated. Pulmonary evaluated patient 6-12.    AKI on  CKD (chronic kidney disease), stage III -Multifactorial secondary to HF, Diuretics, Sepsis.   -Hyperkalemia: received kayexalate, Calcium gluconate.   Mildly elevated troponin -On 5/28 troponin was noted to be 0.04>0.05 > 0.04> 0.06 -Likely secondary to acute infection/septic shock-no further management  PAF -In sinus rhythm - Holding Eliquis secondary to thrombocytopenia    Recent shingles -left upper back-completed treatment with valacyclovir  Diabetes mellitus -Diet controlled-follow CBGs -  insulin level, c peptide normal -Multifactorial, multiple organ system failure. Marland Kitchen  Appreciate Dr Lucianne Muss evaluation.   Hypothyroidism -TSH markedly elevated at 29. T 4 at 1.1. -He was given synthroid  IV   S/P AVR-(tissue) 2011 at [facility] - no CABG with this  Obesity- BMI 45   Code Status: DO NOT RESUSCITATE DVT prophylaxis: holding eliquis due to thrombocytopenia.  Consultants: Pulmonary critical care, palliative care Procedures: Echo 5/17 Left ventricle: The cavity size was normal. Systolic function was mildly to moderately reduced. The estimated ejection fraction was in the range of 40% to 45%. Regional wall motion abnormalities cannot be excluded. - Aortic valve: There was mild regurgitation. - Mitral valve: Calcified annulus. Mildly thickened leaflets . There was mild regurgitation. - Right ventricle: The cavity size was mildly dilated. Systolic function was mildly reduced. - Right atrium: The atrium was mildly dilated. - Pulmonary arteries: PA peak pressure: 32 mm Hg (S). - Pericardium, extracardiac: A trivial pericardial effusion was identified.   Antibiotics: Anti-infectives    Start     Dose/Rate Route Frequency Ordered Stop   02/19/15 1800  piperacillin-tazobactam (ZOSYN) IVPB 2.25 g  Status:  Discontinued     2.25 g 100 mL/hr over 30 Minutes Intravenous 4 times per day 02/19/15 1644 02/20/15 1046   02/10/15 2000  cephALEXin (KEFLEX) capsule 500 mg     500 mg Oral Every 8 hours 02/10/15 1212 02/10/15 2050   02/08/15 2000  cephALEXin (KEFLEX) capsule 500 mg  Status:  Discontinued     500 mg Oral Every 8 hours 02/08/15 1715 02/10/15 1212   02/05/15 1800  azithromycin (ZITHROMAX) 250 mg in dextrose 5 % 125 mL IVPB  Status:  Discontinued     250 mg 125 mL/hr over 60 Minutes Intravenous Every 24 hours 06-Feb-2015 2022 02/07/15 1148   02/06/15 2200  linezolid (ZYVOX) IVPB 600 mg  Status:  Discontinued     600 mg 300 mL/hr over 60 Minutes Intravenous Every 12 hours 06-Feb-2015 2022 02/06/15 1224   06-Feb-2015 2200  ceFEPIme (MAXIPIME) 2 g in dextrose  5 % 50 mL IVPB  Status:  Discontinued     2 g 100 mL/hr over 30 Minutes Intravenous Every 24 hours Feb 06, 2015 2057 02/08/15 1715   Feb 06, 2015 1630  linezolid (ZYVOX) IVPB 600 mg  Status:  Discontinued     600 mg 300 mL/hr over 60 Minutes Intravenous  Once 06-Feb-2015 1620 2015/02/06 2023   2015/02/06 1630  levofloxacin (LEVAQUIN) IVPB 500 mg     500 mg 100 mL/hr over 60 Minutes Intravenous  Once 02-06-2015 1620 02-06-2015 1810   2015-02-06 1500  cefTRIAXone (ROCEPHIN) 1 g in dextrose 5 % 50 mL IVPB     1 g 100 mL/hr over 30 Minutes Intravenous  Once 2015/02/06 1446 02-06-2015 1614      Objective: Filed Weights   02/19/15 0631 02/19/15 1741 02/20/15 0458  Weight: 97.6 kg (215 lb 2.7 oz) 97.4 kg (214 lb 11.7 oz) 98.9 kg (218 lb 0.6 oz)    Intake/Output Summary (Last 24 hours) at 02/24/2015 1700 Last data filed at 02/17/2015 1403  Gross per 24 hour  Intake      0 ml  Output      0 ml  Net      0 ml     Vitals Filed Vitals:   02/20/15 0458 02/20/15 0700 02/20/15 0800 02/20/15 0843  BP:  93/47 95/54 108/66  Pulse:  58 37 81  Temp:    94.8 F (34.9 C)  TempSrc:    Rectal  Resp:  Height:      Weight: 98.9 kg (218 lb 0.6 oz)     SpO2:  99% 99% 99%    Exam:  General: Unresponsive  Cardiovascular: Irregular rate and rhythm, S1/S2 No murmur  Respiratory: bilateral air movement, mild tachypnea. Crackles bases.   Abdomen: Soft, +Bowel sounds,  tender, non distended, no guarding + abdominal wall edema  MSK: bilateral LE edema.   Data Reviewed: Basic Metabolic Panel:  Recent Labs Lab 02/16/15 0400 02/17/15 0457 02/17/15 1951 02/18/15 0500 02/19/15 0434 02/20/15 0250  NA 138 137  --  138 135 137  K 4.3 5.1  --  4.9 5.1 6.4*  CL 100* 101  --  99* 98* 101  CO2 28 27  --  GLUCOSE 64* 74 83 84 100* 83  BUN 36* 39*  --  40* 40* 44*  CREATININE 1.52* 1.85*  --  2.04* 2.33* 2.63*  CALCIUM 8.7* 8.5*  --  8.8* 8.9 9.0   Liver Function Tests: No results for input(s): AST,  ALT, ALKPHOS, BILITOT, PROT, ALBUMIN in the last 168 hours. No results for input(s): LIPASE, AMYLASE in the last 168 hours.  Recent Labs Lab 02/17/15 1848 02/18/15 1253 02/19/15 1252  AMMONIA 71* 60* 138*   CBC:  Recent Labs Lab 02/15/15 0557 02/16/15 0400 02/18/15 0500 02/19/15 0434 02/20/15 0250  WBC 4.1 4.1 4.2 7.2 6.1  NEUTROABS 1.9  --   --   --   --   HGB 9.5* 9.3* 9.0* 9.0* 9.0*  HCT 30.0* 29.8* 28.9* 29.4* 29.5*  MCV 102.7* 102.1* 104.0* 104.6* 102.8*  PLT 43* PLATELET CLUMPS NOTED ON SMEAR, COUNT APPEARS ADEQUATE 41* 66* 59*   Cardiac Enzymes: No results for input(s): CKTOTAL, CKMB, CKMBINDEX, TROPONINI in the last 168 hours. BNP (last 3 results)  Recent Labs  01/12/2015 1318 02/08/15 0524 02/10/15 0534  BNP 840.0* 1285.4* 1210.7*    ProBNP (last 3 results) No results for input(s): PROBNP in the last 8760 hours.  CBG:  Recent Labs Lab 02/19/15 2007 02/20/15 0012 02/20/15 0353 02/20/15 0607 02/20/15 0823  GLUCAP 79 81 77 94 75    Recent Results (from the past 240 hour(s))  Culture, blood (routine x 2)     Status: None   Collection Time: 02/14/15  8:40 AM  Result Value Ref Range Status   Specimen Description BLOOD LEFT ARM  Final   Special Requests BOTTLES DRAWN AEROBIC ONLY 2CC  Final   Culture   Final    NO GROWTH 5 DAYS Performed at [facility]    Report Status 02/20/2015 FINAL  Final  Culture, blood (routine x 2)     Status: None   Collection Time: 02/14/15  8:45 AM  Result Value Ref Range Status   Specimen Description BLOOD RIGHT ARM  Final   Special Requests BOTTLES DRAWN AEROBIC ONLY 3CC  Final   Culture   Final    NO GROWTH 5 DAYS Performed at [facility]    Report Status 02/20/2015 FINAL  Final  Urine culture     Status: None   Collection Time: 02/17/15 10:58 AM  Result Value Ref Range Status   Specimen Description URINE, RANDOM  Final   Special Requests NONE  Final   Colony Count   Final    75,000  COLONIES/ML Performed at [facility]    Culture YEAST Performed at [facility]  Final   Report Status 02/18/2015 FINAL  Final     Studies: No results found.  Scheduled Meds:  Scheduled Meds: . antiseptic oral rinse  7 mL Mouth Rinse BID  . dextrose  25 mL Intravenous Once   Continuous Infusions: . HYDROmorphone 0.5 mg/hr (02/20/15 1458)    Time spent on care of this patient: 35 min   Ivy Puryear, MD [phone] 02/26/2015, 5:00 PM  LOS: 17 days   [facility] Office  [phone] Pager - Text Page per [email or web address] If 7PM-7AM, please contact night-coverage [email or web address]

## 2015-03-10 NOTE — Progress Notes (Signed)
Utilization Review completed. Abu Heavin RN BSN CM 

## 2015-03-10 DEATH — deceased

## 2015-03-20 ENCOUNTER — Other Ambulatory Visit: Payer: Medicare Other

## 2015-03-20 ENCOUNTER — Ambulatory Visit: Payer: Medicare Other

## 2015-03-20 ENCOUNTER — Inpatient Hospital Stay: Payer: Medicare Other | Admitting: Hematology and Oncology

## 2015-10-19 IMAGING — CR DG CHEST 1V PORT
1 series · 1 of 1 positions shown · non-contrast
Comparison: 01/17/2015

CLINICAL DATA: Altered mental status.

EXAM:
PORTABLE CHEST - 1 VIEW

[ap portable]
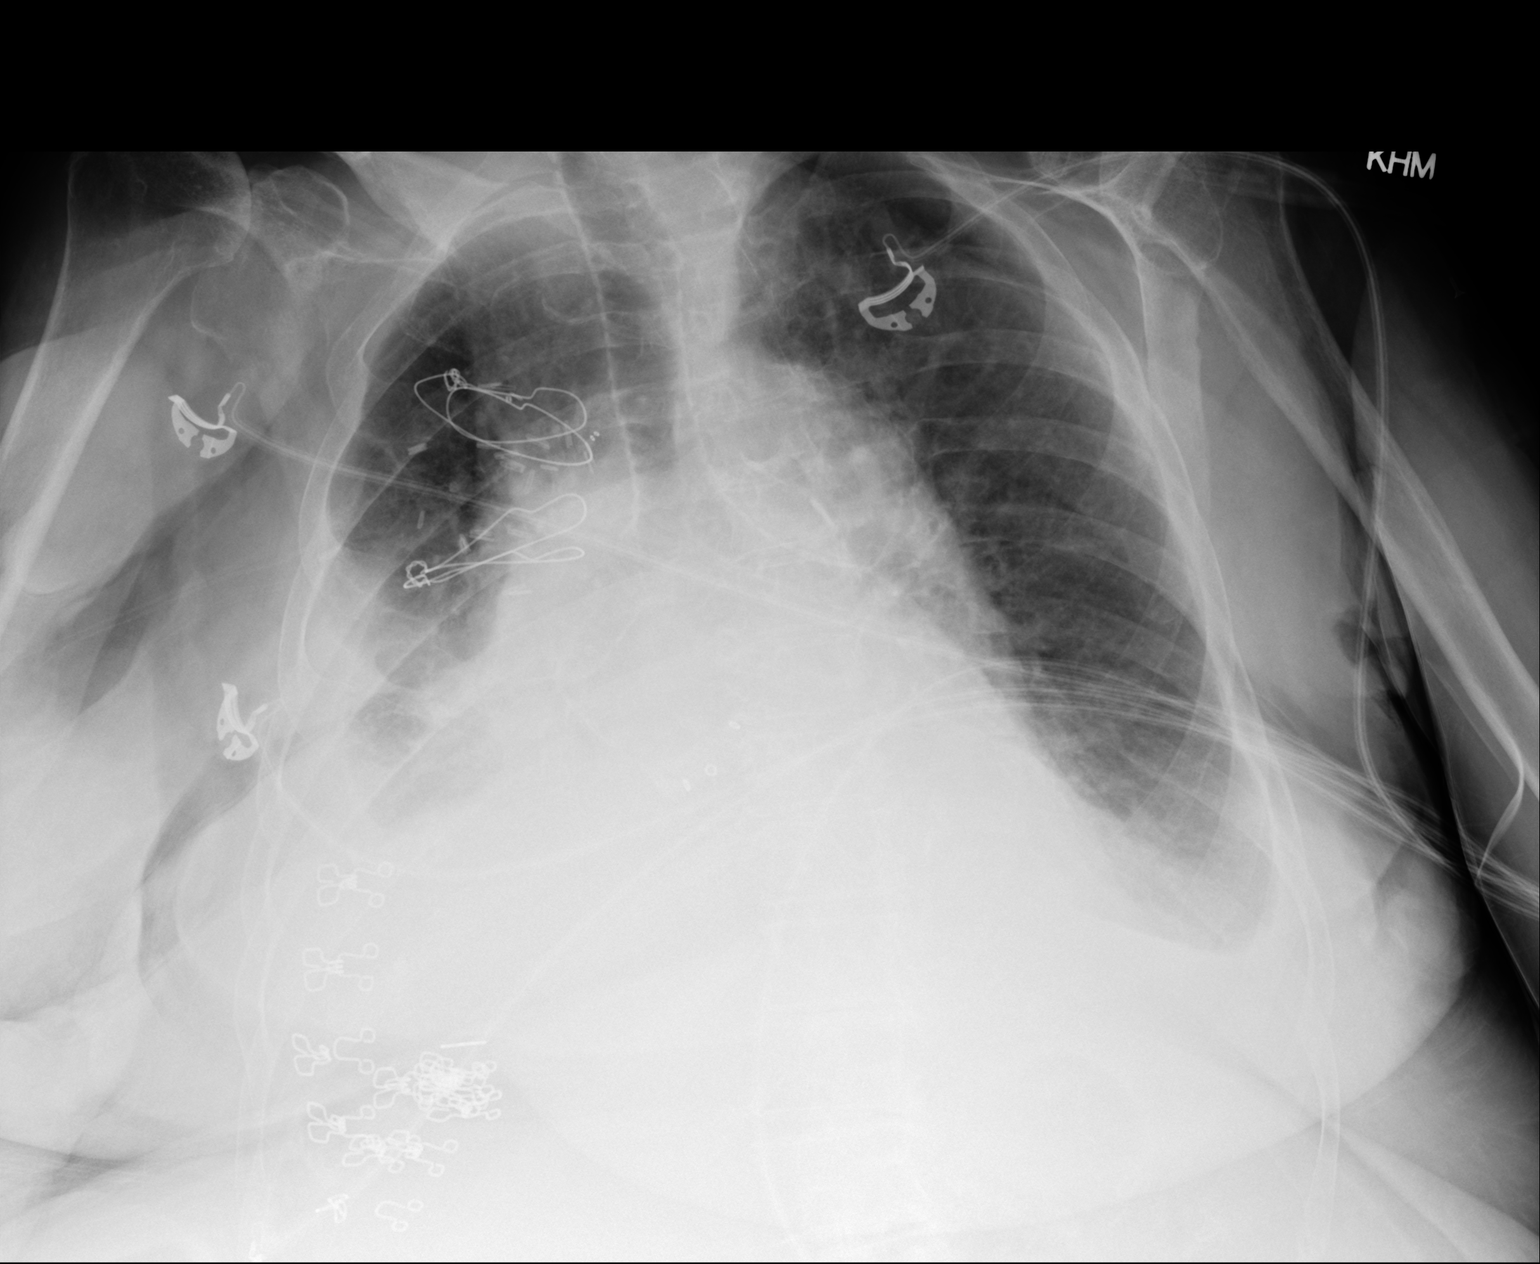

[1 of 1 positions shown; findings below may reference images not displayed]

FINDINGS: Persistent atelectasis versus infiltrate involving the right lower
lung zone with associated probable right pleural effusion. There is
a small left pleural effusion. No overt pulmonary edema identified.
Interval removal of PICC line. Stable cardiac enlargement.
IMPRESSION: Persistent atelectasis versus infiltrate involving the right lower
lung with associated right pleural effusion. Smaller left pleural
effusion present.

## 2015-10-20 IMAGING — CR DG ABD PORTABLE 1V
1 series · 1 of 1 positions shown · non-contrast
Comparison: Abdominal radiograph performed earlier today at [DATE]
a.m.

CLINICAL DATA: Acute onset of generalized abdominal pain and
distention. Initial encounter.

EXAM:
PORTABLE ABDOMEN - 1 VIEW

[AP]
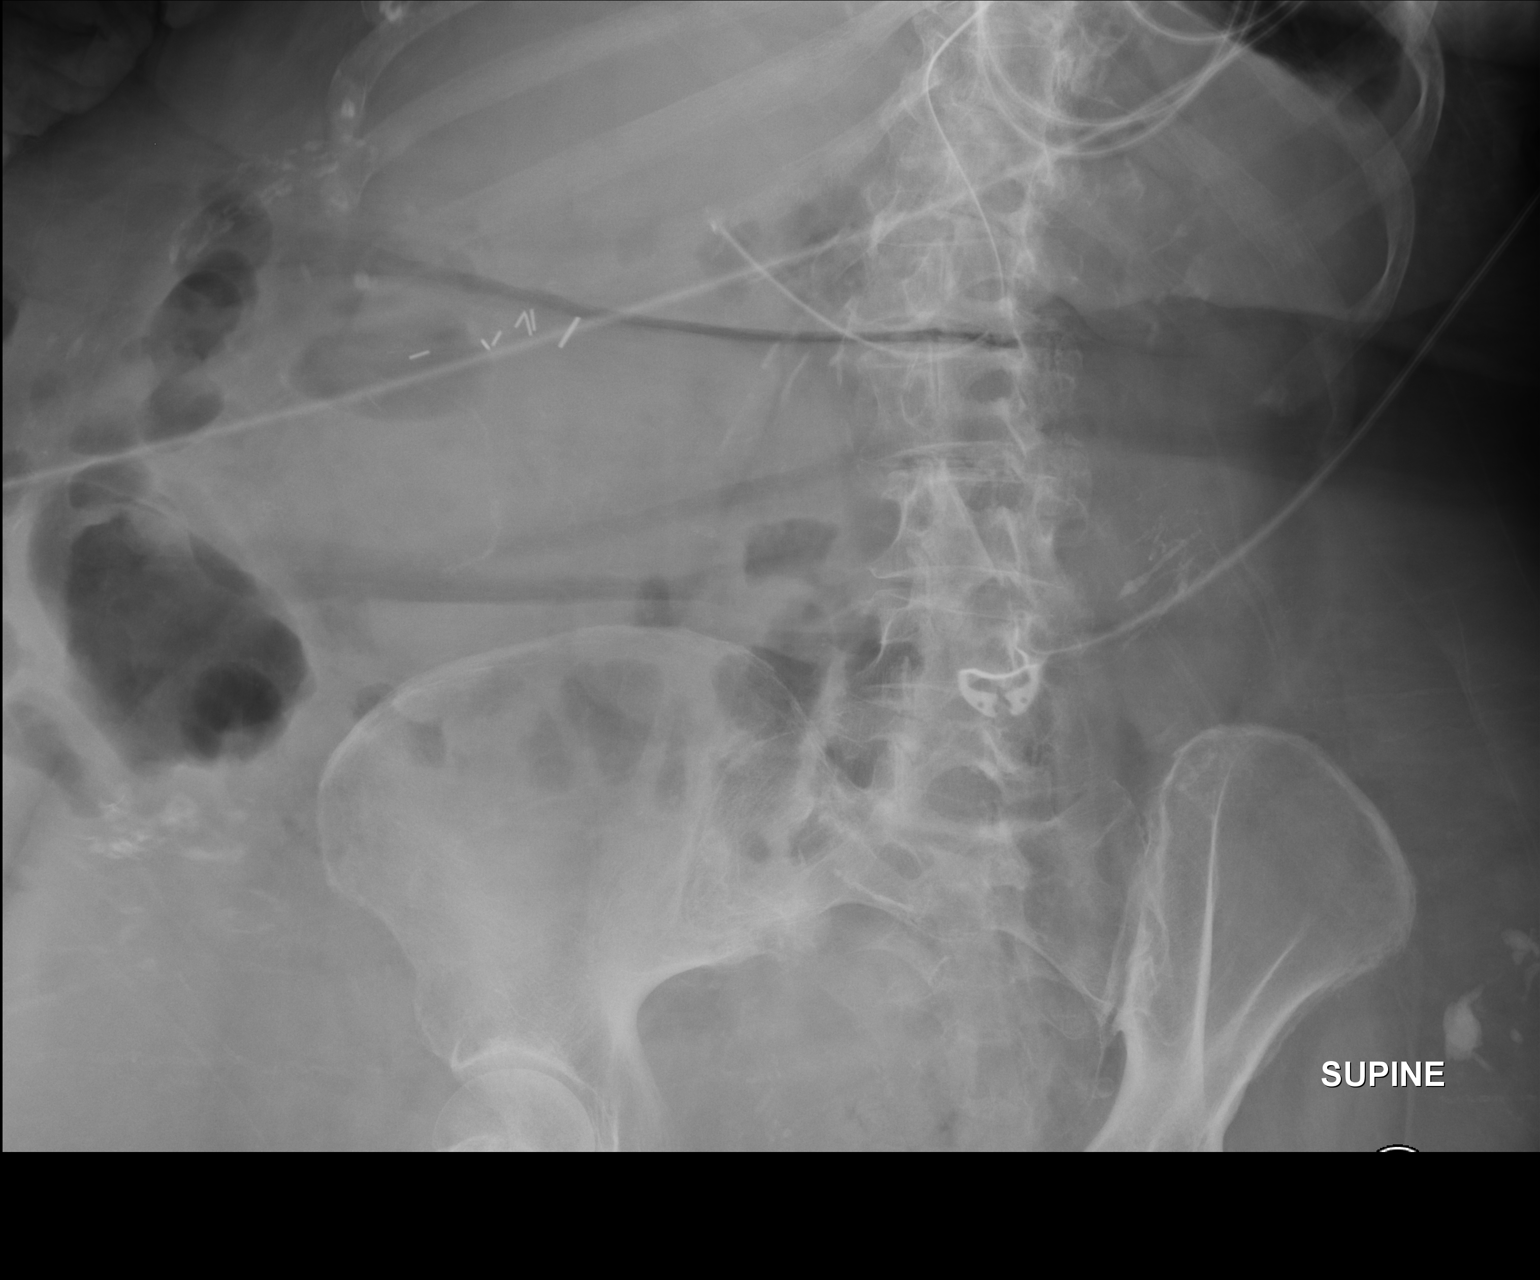

[1 of 1 positions shown; findings below may reference images not displayed]

FINDINGS: The patient's enteric tube is noted ending overlying the body of the
stomach.

The visualized bowel gas pattern is grossly unremarkable. No free
intra-abdominal air is seen, though evaluation for free air is
limited on a single supine view. Scattered clips are noted at the
right mid abdomen.

Mild degenerative change is noted along the lumbar spine.
IMPRESSION: 1. Enteric tube noted ending overlying the body of the stomach.
2. Unremarkable bowel gas pattern; no free intra-abdominal air seen.
Small amount of stool noted in the colon.

## 2015-10-21 IMAGING — CR DG CHEST 1V PORT
1 series · 1 of 1 positions shown · non-contrast
Comparison: 02/04/2015

CLINICAL DATA: Pneumonia

EXAM:
PORTABLE CHEST - 1 VIEW

[AP]
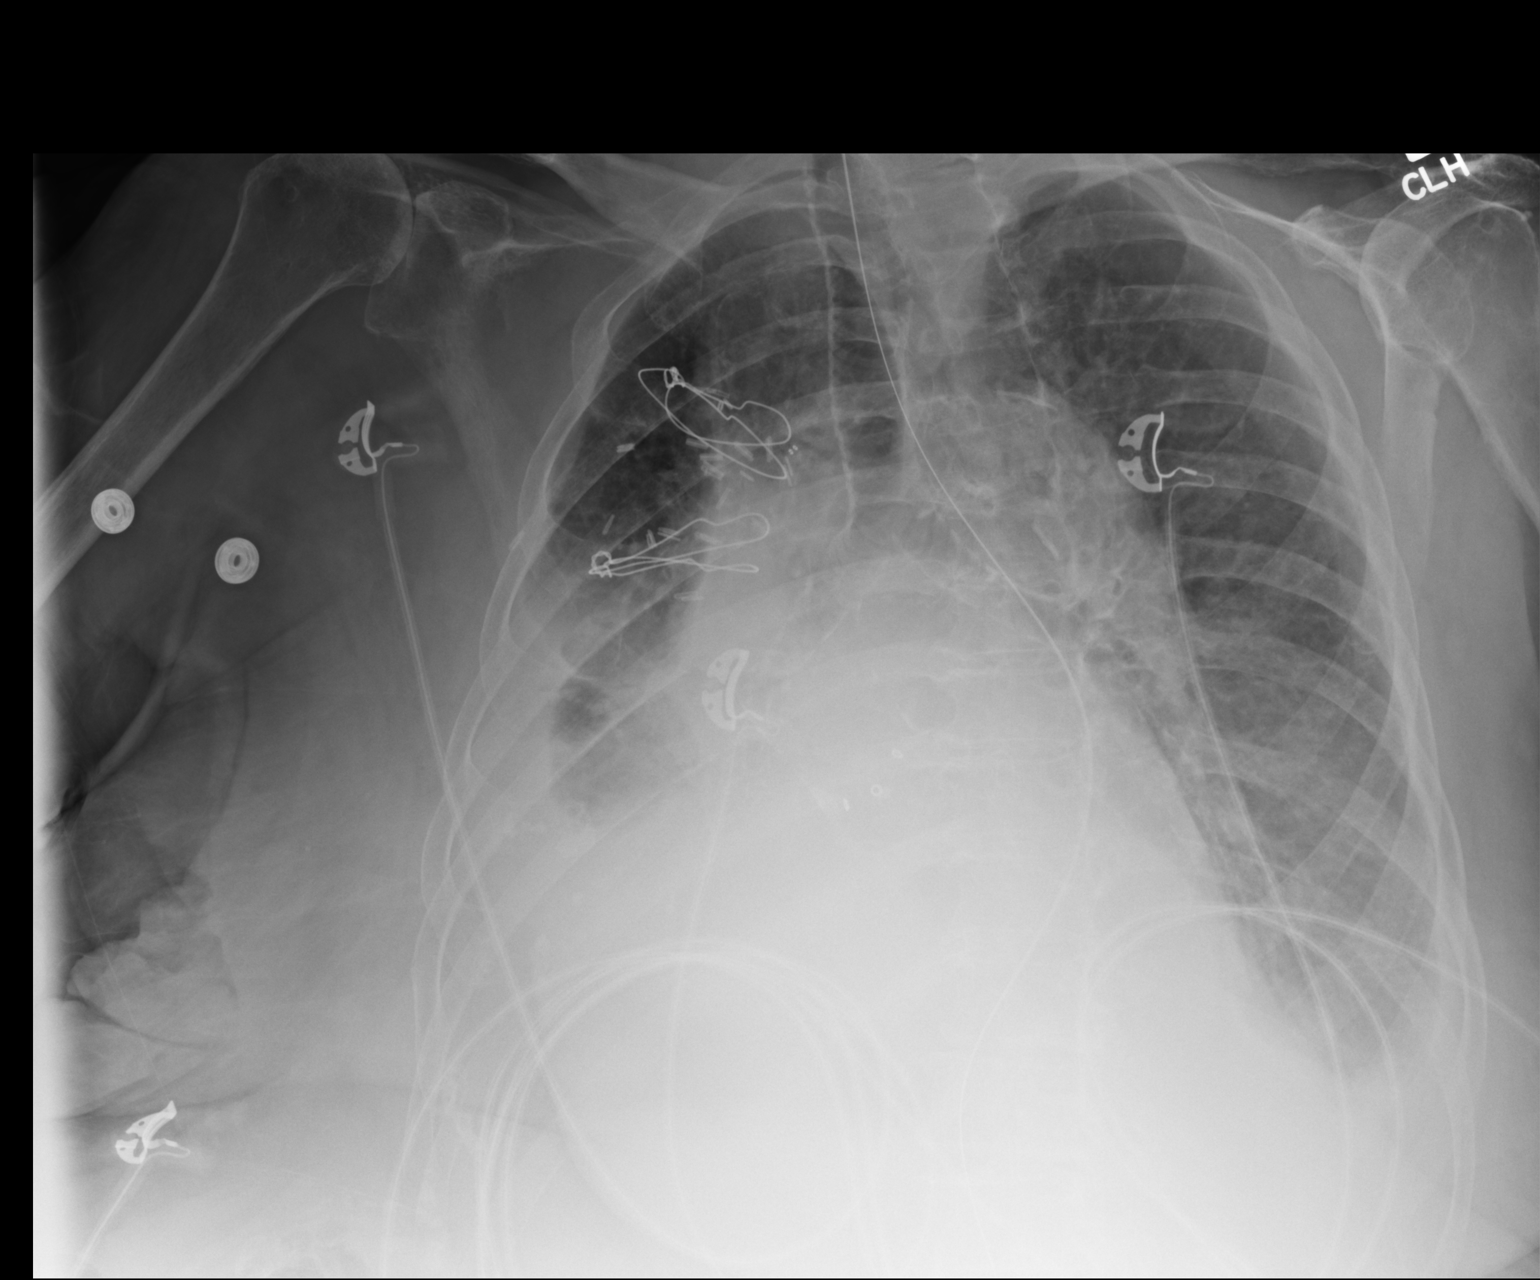

[1 of 1 positions shown; findings below may reference images not displayed]

FINDINGS: Cardiac shadow is within normal limits. Persistent right-sided
pleural effusion and right basilar changes are noted. A smaller
left-sided pleural effusion is again seen and stable. A nasogastric
catheter is extending into the stomach. Aortic calcifications are
seen.
IMPRESSION: Stable bilateral pleural effusions and right basilar changes.

New nasogastric catheter

## 2015-10-29 IMAGING — CR DG ABDOMEN 1V
3 series · 3 of 3 positions shown · non-contrast
Comparison: Prior abdominal radiograph 02/05/2015; prior CT
abdomen/ pelvis 01/06/2015

CLINICAL DATA: 83-year-old female with abdominal pain and history
of multiple prior abdominal surgeries.

EXAM:
ABDOMEN - 1 VIEW

[t abdomen supine (1 of 3)]
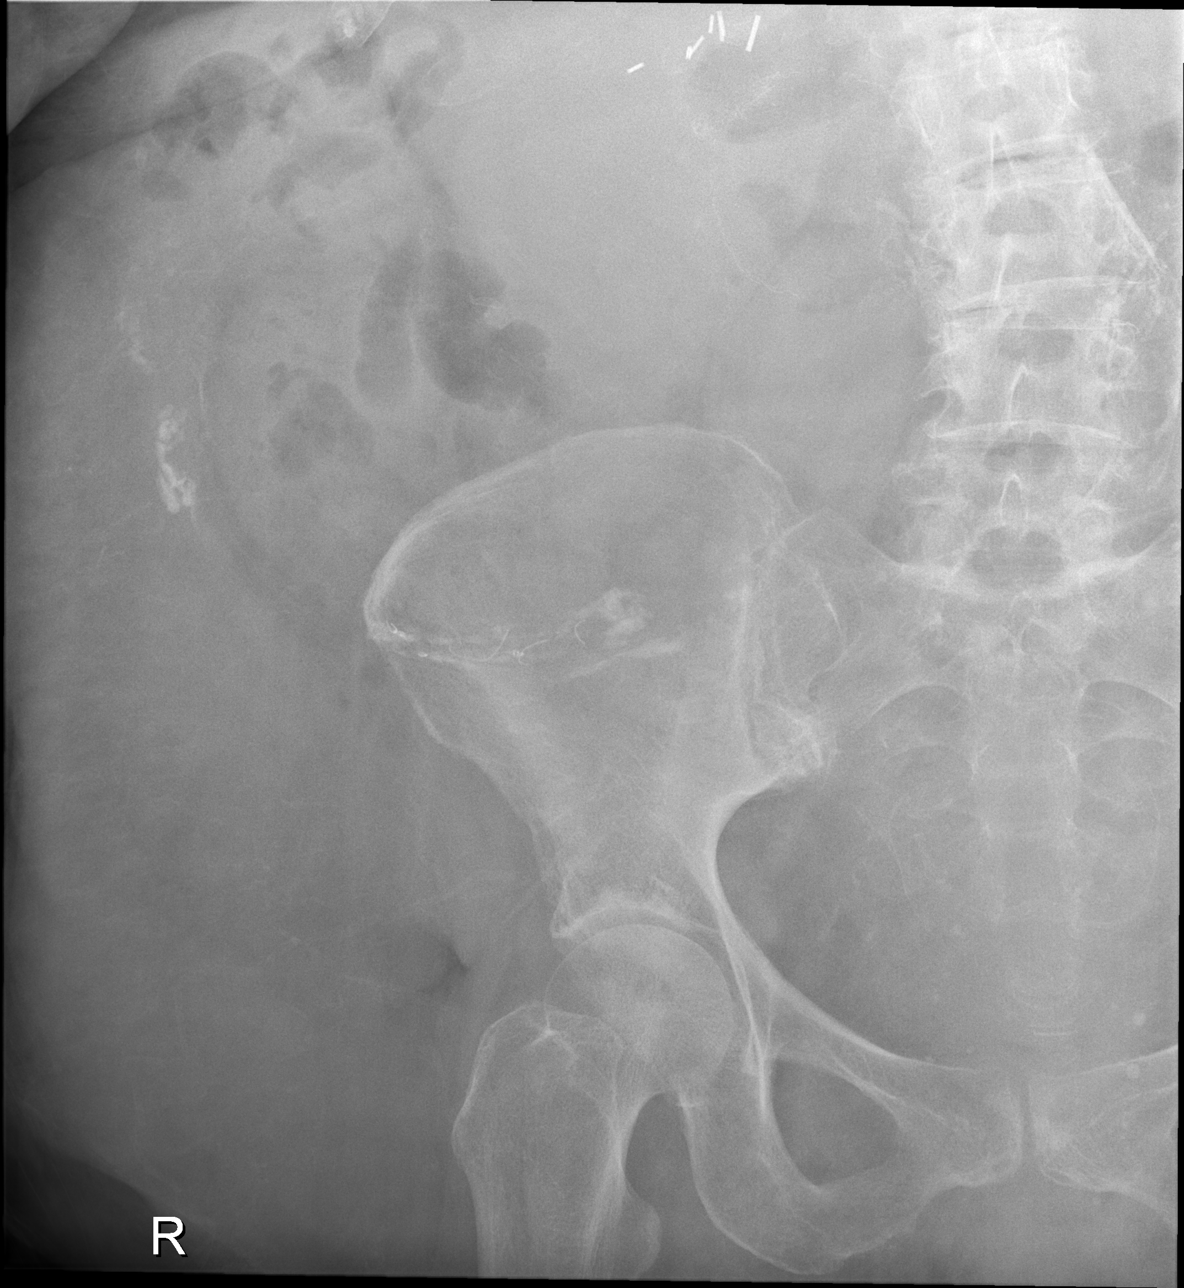

[t abdomen supine (2 of 3)]
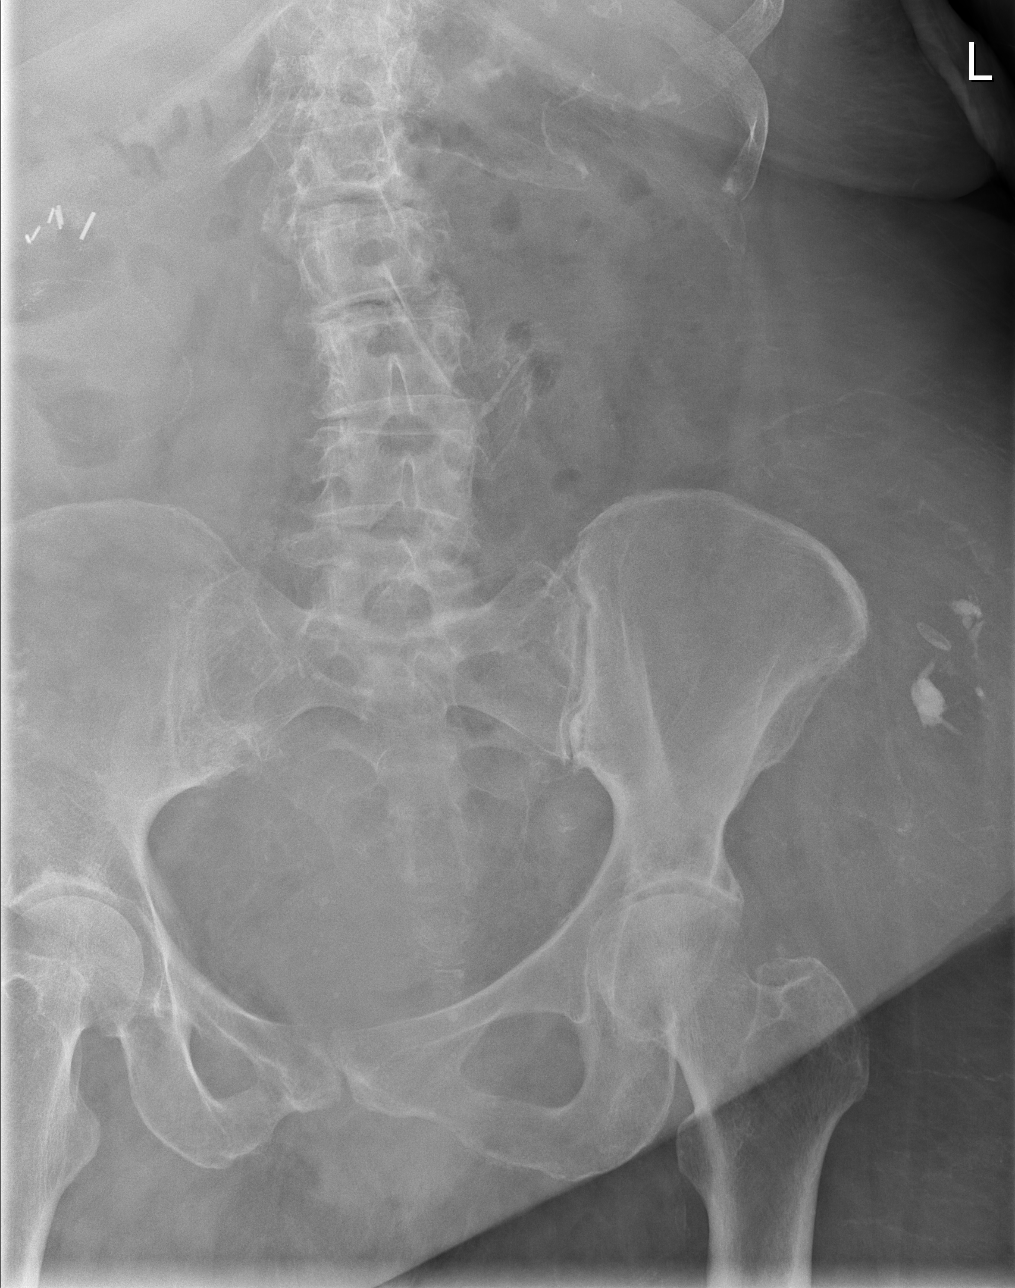

[t abdomen supine (3 of 3)]
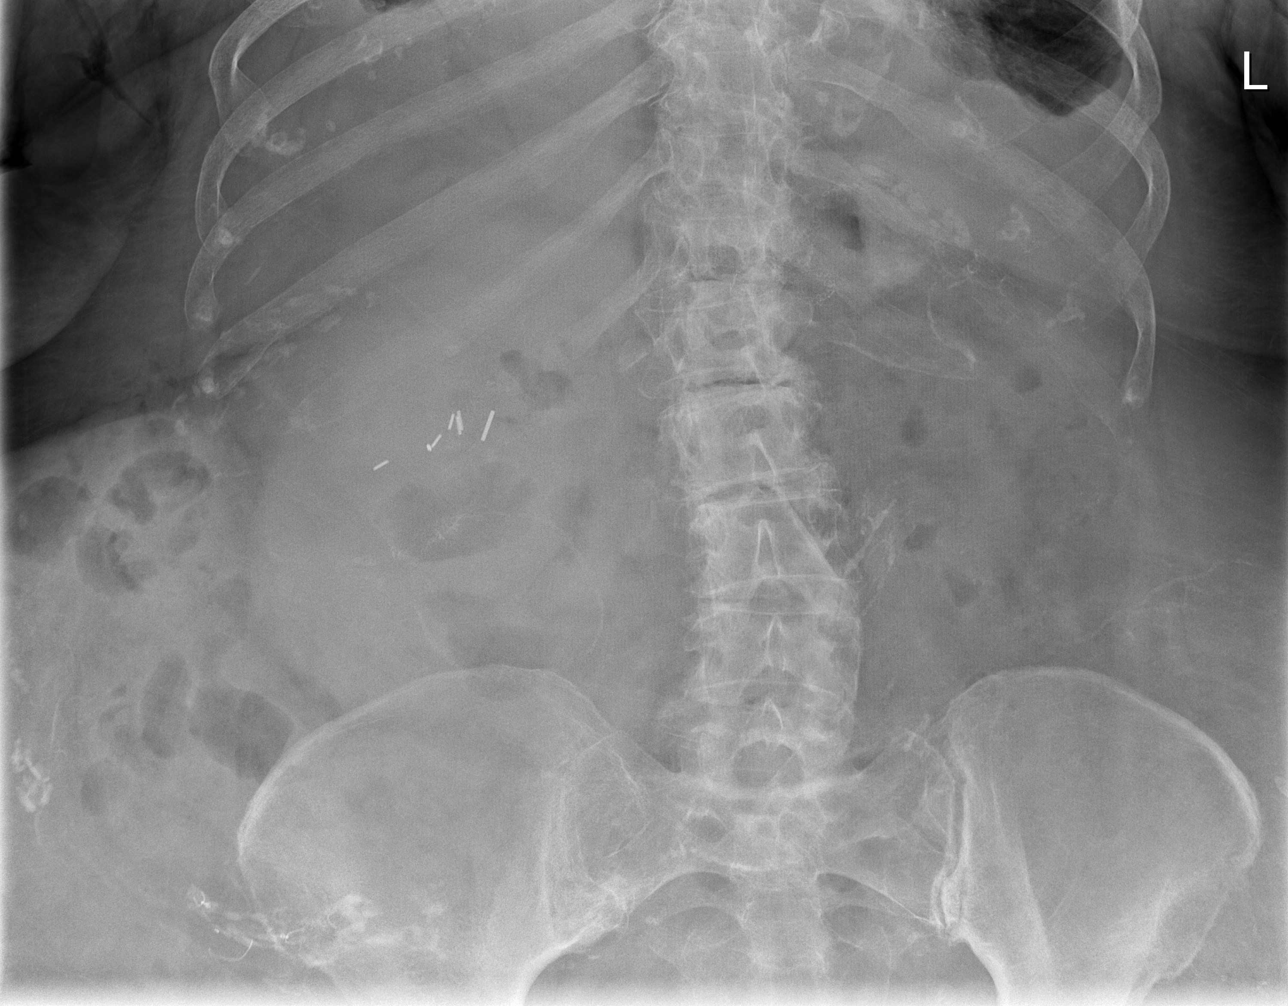

[3 of 3 positions shown; findings below may reference images not displayed]

FINDINGS: A total of 3 frontal radiographs were obtained to encompass the
entirety of the abdomen. No evidence of a bowel obstruction. No
large free air on this supine series. Extensive surgical changes are
present with multifocal regions of chain sutures and surgical clips
in the right upper quadrant. Multilevel degenerative disc disease
throughout the visualized thoracic and lumbar spine.
IMPRESSION: Nonobstructed bowel gas pattern.
